# Patient Record
Sex: Female | Born: 1935
Health system: Southern US, Community
[De-identification: ages and names within clinical notes are randomized; demographics above are authoritative.]

## PROBLEM LIST (undated history)

## (undated) DIAGNOSIS — M722 Plantar fascial fibromatosis: Secondary | ICD-10-CM

## (undated) DIAGNOSIS — I739 Peripheral vascular disease, unspecified: Secondary | ICD-10-CM

## (undated) DIAGNOSIS — I10 Essential (primary) hypertension: Secondary | ICD-10-CM

## (undated) DIAGNOSIS — E119 Type 2 diabetes mellitus without complications: Secondary | ICD-10-CM

## (undated) HISTORY — PX: JOINT REPLACEMENT: SHX530

## (undated) HISTORY — PX: ECTOPIC PREGNANCY SURGERY: SHX613

## (undated) HISTORY — DX: Peripheral vascular disease, unspecified: I73.9

## (undated) HISTORY — DX: Plantar fascial fibromatosis: M72.2

## (undated) HISTORY — PX: ABDOMINAL HYSTERECTOMY: SHX81

## (undated) HISTORY — DX: Type 2 diabetes mellitus without complications: E11.9

---

## 2006-12-12 ENCOUNTER — Emergency Department (HOSPITAL_COMMUNITY): Admission: EM | Admit: 2006-12-12 | Discharge: 2006-12-12 | Payer: Self-pay | Admitting: Emergency Medicine

## 2011-03-31 ENCOUNTER — Emergency Department (HOSPITAL_COMMUNITY): Payer: Medicare Other

## 2011-03-31 ENCOUNTER — Emergency Department (HOSPITAL_COMMUNITY)
Admission: EM | Admit: 2011-03-31 | Discharge: 2011-03-31 | Disposition: A | Discharge status: Home / Self Care | Payer: Medicare Other | Attending: Emergency Medicine | Admitting: Emergency Medicine

## 2011-03-31 DIAGNOSIS — M546 Pain in thoracic spine: Secondary | ICD-10-CM | POA: Insufficient documentation

## 2011-03-31 DIAGNOSIS — I10 Essential (primary) hypertension: Secondary | ICD-10-CM | POA: Insufficient documentation

## 2011-03-31 DIAGNOSIS — R059 Cough, unspecified: Secondary | ICD-10-CM | POA: Insufficient documentation

## 2011-03-31 DIAGNOSIS — J3489 Other specified disorders of nose and nasal sinuses: Secondary | ICD-10-CM | POA: Insufficient documentation

## 2011-03-31 DIAGNOSIS — Z79899 Other long term (current) drug therapy: Secondary | ICD-10-CM | POA: Insufficient documentation

## 2011-03-31 DIAGNOSIS — E7800 Pure hypercholesterolemia, unspecified: Secondary | ICD-10-CM | POA: Insufficient documentation

## 2011-03-31 DIAGNOSIS — R6889 Other general symptoms and signs: Secondary | ICD-10-CM | POA: Insufficient documentation

## 2011-03-31 DIAGNOSIS — Z7982 Long term (current) use of aspirin: Secondary | ICD-10-CM | POA: Insufficient documentation

## 2011-03-31 DIAGNOSIS — R05 Cough: Secondary | ICD-10-CM | POA: Insufficient documentation

## 2011-03-31 DIAGNOSIS — J309 Allergic rhinitis, unspecified: Secondary | ICD-10-CM | POA: Insufficient documentation

## 2011-03-31 DIAGNOSIS — E119 Type 2 diabetes mellitus without complications: Secondary | ICD-10-CM | POA: Insufficient documentation

## 2011-08-02 DIAGNOSIS — L84 Corns and callosities: Secondary | ICD-10-CM | POA: Diagnosis not present

## 2011-08-02 DIAGNOSIS — L608 Other nail disorders: Secondary | ICD-10-CM | POA: Diagnosis not present

## 2011-08-02 DIAGNOSIS — E1159 Type 2 diabetes mellitus with other circulatory complications: Secondary | ICD-10-CM | POA: Diagnosis not present

## 2011-08-02 DIAGNOSIS — I739 Peripheral vascular disease, unspecified: Secondary | ICD-10-CM | POA: Diagnosis not present

## 2011-08-25 DIAGNOSIS — E119 Type 2 diabetes mellitus without complications: Secondary | ICD-10-CM | POA: Diagnosis not present

## 2011-10-21 DIAGNOSIS — E1159 Type 2 diabetes mellitus with other circulatory complications: Secondary | ICD-10-CM | POA: Diagnosis not present

## 2011-10-21 DIAGNOSIS — I739 Peripheral vascular disease, unspecified: Secondary | ICD-10-CM | POA: Diagnosis not present

## 2011-10-21 DIAGNOSIS — L608 Other nail disorders: Secondary | ICD-10-CM | POA: Diagnosis not present

## 2011-10-21 DIAGNOSIS — L84 Corns and callosities: Secondary | ICD-10-CM | POA: Diagnosis not present

## 2011-11-10 DIAGNOSIS — E119 Type 2 diabetes mellitus without complications: Secondary | ICD-10-CM | POA: Diagnosis not present

## 2011-11-10 DIAGNOSIS — H4011X0 Primary open-angle glaucoma, stage unspecified: Secondary | ICD-10-CM | POA: Diagnosis not present

## 2011-11-10 DIAGNOSIS — H04129 Dry eye syndrome of unspecified lacrimal gland: Secondary | ICD-10-CM | POA: Diagnosis not present

## 2011-11-30 DIAGNOSIS — E7800 Pure hypercholesterolemia, unspecified: Secondary | ICD-10-CM | POA: Diagnosis not present

## 2011-11-30 DIAGNOSIS — I10 Essential (primary) hypertension: Secondary | ICD-10-CM | POA: Diagnosis not present

## 2011-12-08 DIAGNOSIS — M25569 Pain in unspecified knee: Secondary | ICD-10-CM | POA: Diagnosis not present

## 2011-12-20 DIAGNOSIS — I10 Essential (primary) hypertension: Secondary | ICD-10-CM | POA: Diagnosis not present

## 2011-12-20 DIAGNOSIS — E109 Type 1 diabetes mellitus without complications: Secondary | ICD-10-CM | POA: Diagnosis not present

## 2011-12-22 DIAGNOSIS — I10 Essential (primary) hypertension: Secondary | ICD-10-CM | POA: Diagnosis not present

## 2011-12-22 DIAGNOSIS — E1110 Type 2 diabetes mellitus with ketoacidosis without coma: Secondary | ICD-10-CM | POA: Diagnosis not present

## 2011-12-22 DIAGNOSIS — E7800 Pure hypercholesterolemia, unspecified: Secondary | ICD-10-CM | POA: Diagnosis not present

## 2012-01-13 DIAGNOSIS — L84 Corns and callosities: Secondary | ICD-10-CM | POA: Diagnosis not present

## 2012-01-13 DIAGNOSIS — L608 Other nail disorders: Secondary | ICD-10-CM | POA: Diagnosis not present

## 2012-01-13 DIAGNOSIS — E1159 Type 2 diabetes mellitus with other circulatory complications: Secondary | ICD-10-CM | POA: Diagnosis not present

## 2012-01-13 DIAGNOSIS — I739 Peripheral vascular disease, unspecified: Secondary | ICD-10-CM | POA: Diagnosis not present

## 2012-02-15 DIAGNOSIS — Z1231 Encounter for screening mammogram for malignant neoplasm of breast: Secondary | ICD-10-CM | POA: Diagnosis not present

## 2012-03-23 DIAGNOSIS — I739 Peripheral vascular disease, unspecified: Secondary | ICD-10-CM | POA: Diagnosis not present

## 2012-03-23 DIAGNOSIS — M2040 Other hammer toe(s) (acquired), unspecified foot: Secondary | ICD-10-CM | POA: Diagnosis not present

## 2012-03-23 DIAGNOSIS — E1159 Type 2 diabetes mellitus with other circulatory complications: Secondary | ICD-10-CM | POA: Diagnosis not present

## 2012-03-23 DIAGNOSIS — L608 Other nail disorders: Secondary | ICD-10-CM | POA: Diagnosis not present

## 2012-03-23 DIAGNOSIS — L84 Corns and callosities: Secondary | ICD-10-CM | POA: Diagnosis not present

## 2012-05-18 DIAGNOSIS — H4011X0 Primary open-angle glaucoma, stage unspecified: Secondary | ICD-10-CM | POA: Diagnosis not present

## 2012-05-18 DIAGNOSIS — H2510 Age-related nuclear cataract, unspecified eye: Secondary | ICD-10-CM | POA: Diagnosis not present

## 2012-05-18 DIAGNOSIS — H04129 Dry eye syndrome of unspecified lacrimal gland: Secondary | ICD-10-CM | POA: Diagnosis not present

## 2012-05-22 DIAGNOSIS — E119 Type 2 diabetes mellitus without complications: Secondary | ICD-10-CM | POA: Diagnosis not present

## 2012-05-22 DIAGNOSIS — E781 Pure hyperglyceridemia: Secondary | ICD-10-CM | POA: Diagnosis not present

## 2012-05-22 DIAGNOSIS — I10 Essential (primary) hypertension: Secondary | ICD-10-CM | POA: Diagnosis not present

## 2012-05-23 DIAGNOSIS — E119 Type 2 diabetes mellitus without complications: Secondary | ICD-10-CM | POA: Diagnosis not present

## 2012-05-23 DIAGNOSIS — E7800 Pure hypercholesterolemia, unspecified: Secondary | ICD-10-CM | POA: Diagnosis not present

## 2012-05-23 DIAGNOSIS — I10 Essential (primary) hypertension: Secondary | ICD-10-CM | POA: Diagnosis not present

## 2012-05-23 DIAGNOSIS — H409 Unspecified glaucoma: Secondary | ICD-10-CM | POA: Diagnosis not present

## 2012-06-12 DIAGNOSIS — Z23 Encounter for immunization: Secondary | ICD-10-CM | POA: Diagnosis not present

## 2012-06-29 DIAGNOSIS — L608 Other nail disorders: Secondary | ICD-10-CM | POA: Diagnosis not present

## 2012-06-29 DIAGNOSIS — I739 Peripheral vascular disease, unspecified: Secondary | ICD-10-CM | POA: Diagnosis not present

## 2012-06-29 DIAGNOSIS — E1159 Type 2 diabetes mellitus with other circulatory complications: Secondary | ICD-10-CM | POA: Diagnosis not present

## 2012-06-29 DIAGNOSIS — L84 Corns and callosities: Secondary | ICD-10-CM | POA: Diagnosis not present

## 2012-10-05 DIAGNOSIS — D2370 Other benign neoplasm of skin of unspecified lower limb, including hip: Secondary | ICD-10-CM | POA: Diagnosis not present

## 2012-10-05 DIAGNOSIS — I739 Peripheral vascular disease, unspecified: Secondary | ICD-10-CM | POA: Diagnosis not present

## 2012-10-05 DIAGNOSIS — E1159 Type 2 diabetes mellitus with other circulatory complications: Secondary | ICD-10-CM | POA: Diagnosis not present

## 2012-10-05 DIAGNOSIS — L608 Other nail disorders: Secondary | ICD-10-CM | POA: Diagnosis not present

## 2012-10-05 DIAGNOSIS — M2040 Other hammer toe(s) (acquired), unspecified foot: Secondary | ICD-10-CM | POA: Diagnosis not present

## 2012-10-11 ENCOUNTER — Other Ambulatory Visit: Payer: Self-pay

## 2012-10-11 ENCOUNTER — Encounter: Payer: Self-pay | Admitting: Vascular Surgery

## 2012-10-11 DIAGNOSIS — M79609 Pain in unspecified limb: Secondary | ICD-10-CM

## 2012-10-11 DIAGNOSIS — R0989 Other specified symptoms and signs involving the circulatory and respiratory systems: Secondary | ICD-10-CM

## 2012-10-19 DIAGNOSIS — E119 Type 2 diabetes mellitus without complications: Secondary | ICD-10-CM | POA: Diagnosis not present

## 2012-10-19 DIAGNOSIS — I10 Essential (primary) hypertension: Secondary | ICD-10-CM | POA: Diagnosis not present

## 2012-10-20 DIAGNOSIS — I10 Essential (primary) hypertension: Secondary | ICD-10-CM | POA: Diagnosis not present

## 2012-10-20 DIAGNOSIS — E7800 Pure hypercholesterolemia, unspecified: Secondary | ICD-10-CM | POA: Diagnosis not present

## 2012-10-20 DIAGNOSIS — E119 Type 2 diabetes mellitus without complications: Secondary | ICD-10-CM | POA: Diagnosis not present

## 2012-11-01 ENCOUNTER — Encounter: Payer: Self-pay | Admitting: Vascular Surgery

## 2012-11-02 ENCOUNTER — Encounter: Payer: Self-pay | Admitting: Vascular Surgery

## 2012-11-02 ENCOUNTER — Ambulatory Visit (INDEPENDENT_AMBULATORY_CARE_PROVIDER_SITE_OTHER): Payer: Medicare Other | Admitting: Vascular Surgery

## 2012-11-02 ENCOUNTER — Encounter (INDEPENDENT_AMBULATORY_CARE_PROVIDER_SITE_OTHER): Payer: Medicare Other | Admitting: *Deleted

## 2012-11-02 VITALS — BP 122/78 | HR 80 | Ht 62.0 in | Wt 220.3 lb

## 2012-11-02 DIAGNOSIS — I739 Peripheral vascular disease, unspecified: Secondary | ICD-10-CM | POA: Diagnosis not present

## 2012-11-02 DIAGNOSIS — R0989 Other specified symptoms and signs involving the circulatory and respiratory systems: Secondary | ICD-10-CM | POA: Diagnosis not present

## 2012-11-02 DIAGNOSIS — M79609 Pain in unspecified limb: Secondary | ICD-10-CM | POA: Diagnosis not present

## 2012-11-02 NOTE — Progress Notes (Signed)
[facility] [place] HISTORY AND PHYSICAL   History of Present Illness:  Patient is a 79 y.o. year old female who presents for evaluation of perfusion and healing potential for a pending hammertoe operation. She does not really complain of pain in his hammertoe deformities. She has not had any ulcerations of the feet due to to overlap. The patient denies history of claudication. She denies history of rest pain. She has bilateral hammertoes. She was recently seen and evaluated by Dr. Elvin [name] for this. She does have a history of diabetes. She does not have any history of nonhealing wounds. She does not smoke. She does have family history her mother of similar hammertoe deformities as well as problems with atherosclerosis of her lower extremities. She does have some occasional numbness and tingling in her toes. She has had bilateral knee replacements. Apparently the left side was infected and required a muscle flap from her right calf area to heal. This was several years ago in [place]. Other medical problems include diabetes and peripheral arterial disease as mentioned above. These are both currently stable. She is on a statin and aspirin.  Past Medical History  Diagnosis Date  . Peripheral vascular disease   . Diabetes mellitus without complication   . Plantar fascial fibromatosis     Past Surgical History  Procedure Laterality Date  . Abdominal hysterectomy      partial  . Ectopic pregnancy surgery    . Joint replacement Bilateral     TKR     Social History History  Substance Use Topics  . Smoking status: Never Smoker   . Smokeless tobacco: Never Used  . Alcohol Use: No    Family History Family History  Problem Relation Age of Onset  . Hypertension Mother   . Hyperlipidemia Mother     before age 63  . Heart disease Father   . Hypertension Father   . Hyperlipidemia Father     before age 60  . Diabetes Sister   . Other Sister     amputation     Allergies  No Known Allergies   Current Outpatient Prescriptions  Medication Sig Dispense Refill  . aspirin 81 MG tablet Take 81 mg by mouth daily.      Marland Kitchen glipiZIDE (GLUCOTROL) 5 MG tablet Take 5 mg by mouth 2 (two) times daily before a meal.      . lisinopril-hydrochlorothiazide (PRINZIDE,ZESTORETIC) 20-12.5 MG per tablet Take 1 tablet by mouth 2 (two) times daily.      . Multiple Vitamins-Minerals (CENTRUM SILVER ADULT 50+ PO) Take 1 tablet by mouth daily.      Bertram Gala Glycol-Propyl Glycol (SYSTANE ULTRA OP) Apply to eye.      . simvastatin (ZOCOR) 40 MG tablet Take 40 mg by mouth daily.      Marland Kitchen VERAPAMIL HCL ER PO Take 300 mg by mouth daily.      . vitamin C (ASCORBIC ACID) 500 MG tablet Take 500 mg by mouth daily.       No current facility-administered medications for this visit.    ROS:   General:  No weight loss, Fever, chills  HEENT: No recent headaches, no nasal bleeding, no visual changes, no sore throat  Neurologic: No dizziness, blackouts, seizures. No recent symptoms of stroke or mini- stroke. No recent episodes of slurred speech, or temporary blindness.  Cardiac: No recent episodes of chest pain/pressure, no shortness of breath at rest.  No shortness of breath with exertion.  Denies history of atrial fibrillation or irregular heartbeat  Vascular: No history of rest pain in feet.  No history of claudication.  No history of non-healing ulcer, No history of DVT   Pulmonary: No home oxygen, no productive cough, no hemoptysis,  No asthma or wheezing  Musculoskeletal:  [ ]  Arthritis, [ ]  Low back pain,  [ x ] Joint pain  Hematologic:No history of hypercoagulable state.  No history of easy bleeding.  No history of anemia  Gastrointestinal: No hematochezia or melena,  No gastroesophageal reflux, no trouble swallowing  Urinary: [ ]  chronic Kidney disease, [ ]  on HD - [ ]  MWF or [ ]  TTHS, [ ]  Burning with urination, [ ]  Frequent urination, [ ]  Difficulty urinating;    Skin: No rashes  Psychological: No history of anxiety,  No history of depression   Physical Examination  Filed Vitals:   11/02/12 1006  BP: 122/78  Pulse: 80  Height: 5\' 2"  (1.575 m)  Weight: 220 lb 4.8 oz (99.927 kg)  SpO2: 100%    Body mass index is 40.28 kg/(m^2).  General:  Alert and oriented, no acute distress HEENT: Normal Neck: No bruit or JVD Pulmonary: Clear to auscultation bilaterally Cardiac: Regular Rate and Rhythm without murmur Abdomen: Soft, non-tender, non-distended, no mass, obese Skin: No rash, no ulcer Extremity Pulses:  2+ radial, brachial, femoral, absent popliteal dorsalis pedis, posterior tibial pulses bilaterally Musculoskeletal: No edema, hammertoe deformity left third toe right second toe Neurologic: Upper and lower extremity motor 5/5 and symmetric  DATA: Patient had bilateral ABIs today which were 1.5 on the right 1.6 on the left. These were artificially elevated suggesting vessel calcification. Waveforms were biphasic to triphasic which is normal. Her toe pressure on the left side was 107 which should have good wound healing potential. The toe pressure on the right was 60 which has marginal wound healing potential.   ASSESSMENT: 79 year old female with diabetes and objective evidence of most likely small vessel disease right foot greater than left. She does have normal arterial waveforms in the feet. I believe she would have good wound healing potential in the left foot. The right foot may be marginal. However, have encouraged her to avoid any operations and her feet unless she has severe symptoms. She will followup with [place] on as-needed basis.   PLAN:  See above  Fabienne Bruns, MD [facility] Office: [phone] Pager: [phone]

## 2012-11-16 DIAGNOSIS — H4011X0 Primary open-angle glaucoma, stage unspecified: Secondary | ICD-10-CM | POA: Diagnosis not present

## 2012-11-16 DIAGNOSIS — H2510 Age-related nuclear cataract, unspecified eye: Secondary | ICD-10-CM | POA: Diagnosis not present

## 2012-11-16 DIAGNOSIS — E119 Type 2 diabetes mellitus without complications: Secondary | ICD-10-CM | POA: Diagnosis not present

## 2013-01-01 DIAGNOSIS — L84 Corns and callosities: Secondary | ICD-10-CM | POA: Diagnosis not present

## 2013-01-01 DIAGNOSIS — I739 Peripheral vascular disease, unspecified: Secondary | ICD-10-CM | POA: Diagnosis not present

## 2013-01-01 DIAGNOSIS — M7150 Other bursitis, not elsewhere classified, unspecified site: Secondary | ICD-10-CM | POA: Diagnosis not present

## 2013-01-01 DIAGNOSIS — E1159 Type 2 diabetes mellitus with other circulatory complications: Secondary | ICD-10-CM | POA: Diagnosis not present

## 2013-01-01 DIAGNOSIS — M2010 Hallux valgus (acquired), unspecified foot: Secondary | ICD-10-CM | POA: Diagnosis not present

## 2013-01-01 DIAGNOSIS — L608 Other nail disorders: Secondary | ICD-10-CM | POA: Diagnosis not present

## 2013-01-01 DIAGNOSIS — D2370 Other benign neoplasm of skin of unspecified lower limb, including hip: Secondary | ICD-10-CM | POA: Diagnosis not present

## 2013-02-19 DIAGNOSIS — Z1231 Encounter for screening mammogram for malignant neoplasm of breast: Secondary | ICD-10-CM | POA: Diagnosis not present

## 2013-02-20 DIAGNOSIS — I10 Essential (primary) hypertension: Secondary | ICD-10-CM | POA: Diagnosis not present

## 2013-02-20 DIAGNOSIS — E119 Type 2 diabetes mellitus without complications: Secondary | ICD-10-CM | POA: Diagnosis not present

## 2013-02-20 DIAGNOSIS — E7800 Pure hypercholesterolemia, unspecified: Secondary | ICD-10-CM | POA: Diagnosis not present

## 2013-03-26 DIAGNOSIS — I739 Peripheral vascular disease, unspecified: Secondary | ICD-10-CM | POA: Diagnosis not present

## 2013-03-26 DIAGNOSIS — L608 Other nail disorders: Secondary | ICD-10-CM | POA: Diagnosis not present

## 2013-03-26 DIAGNOSIS — E1159 Type 2 diabetes mellitus with other circulatory complications: Secondary | ICD-10-CM | POA: Diagnosis not present

## 2013-03-26 DIAGNOSIS — L84 Corns and callosities: Secondary | ICD-10-CM | POA: Diagnosis not present

## 2013-04-26 DIAGNOSIS — Z23 Encounter for immunization: Secondary | ICD-10-CM | POA: Diagnosis not present

## 2013-05-31 DIAGNOSIS — H2510 Age-related nuclear cataract, unspecified eye: Secondary | ICD-10-CM | POA: Diagnosis not present

## 2013-05-31 DIAGNOSIS — H4011X0 Primary open-angle glaucoma, stage unspecified: Secondary | ICD-10-CM | POA: Diagnosis not present

## 2013-05-31 DIAGNOSIS — H04129 Dry eye syndrome of unspecified lacrimal gland: Secondary | ICD-10-CM | POA: Diagnosis not present

## 2013-09-03 DIAGNOSIS — M25569 Pain in unspecified knee: Secondary | ICD-10-CM | POA: Diagnosis not present

## 2013-09-27 DIAGNOSIS — L84 Corns and callosities: Secondary | ICD-10-CM | POA: Diagnosis not present

## 2013-09-27 DIAGNOSIS — E1159 Type 2 diabetes mellitus with other circulatory complications: Secondary | ICD-10-CM | POA: Diagnosis not present

## 2013-09-27 DIAGNOSIS — L608 Other nail disorders: Secondary | ICD-10-CM | POA: Diagnosis not present

## 2013-09-27 DIAGNOSIS — I739 Peripheral vascular disease, unspecified: Secondary | ICD-10-CM | POA: Diagnosis not present

## 2013-10-23 DIAGNOSIS — I10 Essential (primary) hypertension: Secondary | ICD-10-CM | POA: Diagnosis not present

## 2013-10-23 DIAGNOSIS — M1250 Traumatic arthropathy, unspecified site: Secondary | ICD-10-CM | POA: Diagnosis not present

## 2013-10-23 DIAGNOSIS — E119 Type 2 diabetes mellitus without complications: Secondary | ICD-10-CM | POA: Diagnosis not present

## 2013-12-05 DIAGNOSIS — E119 Type 2 diabetes mellitus without complications: Secondary | ICD-10-CM | POA: Diagnosis not present

## 2013-12-05 DIAGNOSIS — H2510 Age-related nuclear cataract, unspecified eye: Secondary | ICD-10-CM | POA: Diagnosis not present

## 2013-12-05 DIAGNOSIS — H40019 Open angle with borderline findings, low risk, unspecified eye: Secondary | ICD-10-CM | POA: Diagnosis not present

## 2013-12-20 DIAGNOSIS — I739 Peripheral vascular disease, unspecified: Secondary | ICD-10-CM | POA: Diagnosis not present

## 2013-12-20 DIAGNOSIS — L608 Other nail disorders: Secondary | ICD-10-CM | POA: Diagnosis not present

## 2013-12-20 DIAGNOSIS — L84 Corns and callosities: Secondary | ICD-10-CM | POA: Diagnosis not present

## 2013-12-20 DIAGNOSIS — E1159 Type 2 diabetes mellitus with other circulatory complications: Secondary | ICD-10-CM | POA: Diagnosis not present

## 2014-02-20 DIAGNOSIS — I10 Essential (primary) hypertension: Secondary | ICD-10-CM | POA: Diagnosis not present

## 2014-02-20 DIAGNOSIS — E119 Type 2 diabetes mellitus without complications: Secondary | ICD-10-CM | POA: Diagnosis not present

## 2014-03-01 DIAGNOSIS — I739 Peripheral vascular disease, unspecified: Secondary | ICD-10-CM | POA: Diagnosis not present

## 2014-03-01 DIAGNOSIS — L608 Other nail disorders: Secondary | ICD-10-CM | POA: Diagnosis not present

## 2014-03-01 DIAGNOSIS — L84 Corns and callosities: Secondary | ICD-10-CM | POA: Diagnosis not present

## 2014-03-01 DIAGNOSIS — E1159 Type 2 diabetes mellitus with other circulatory complications: Secondary | ICD-10-CM | POA: Diagnosis not present

## 2014-03-05 DIAGNOSIS — Z1231 Encounter for screening mammogram for malignant neoplasm of breast: Secondary | ICD-10-CM | POA: Diagnosis not present

## 2014-05-23 DIAGNOSIS — L84 Corns and callosities: Secondary | ICD-10-CM | POA: Diagnosis not present

## 2014-05-23 DIAGNOSIS — I739 Peripheral vascular disease, unspecified: Secondary | ICD-10-CM | POA: Diagnosis not present

## 2014-05-23 DIAGNOSIS — L603 Nail dystrophy: Secondary | ICD-10-CM | POA: Diagnosis not present

## 2014-05-23 DIAGNOSIS — E1151 Type 2 diabetes mellitus with diabetic peripheral angiopathy without gangrene: Secondary | ICD-10-CM | POA: Diagnosis not present

## 2014-06-21 DIAGNOSIS — E0800 Diabetes mellitus due to underlying condition with hyperosmolarity without nonketotic hyperglycemic-hyperosmolar coma (NKHHC): Secondary | ICD-10-CM | POA: Diagnosis not present

## 2014-06-21 DIAGNOSIS — M1250 Traumatic arthropathy, unspecified site: Secondary | ICD-10-CM | POA: Diagnosis not present

## 2014-06-21 DIAGNOSIS — Z23 Encounter for immunization: Secondary | ICD-10-CM | POA: Diagnosis not present

## 2014-06-21 DIAGNOSIS — E780 Pure hypercholesterolemia: Secondary | ICD-10-CM | POA: Diagnosis not present

## 2014-06-21 DIAGNOSIS — I10 Essential (primary) hypertension: Secondary | ICD-10-CM | POA: Diagnosis not present

## 2014-06-23 DIAGNOSIS — Z23 Encounter for immunization: Secondary | ICD-10-CM | POA: Diagnosis not present

## 2014-09-12 DIAGNOSIS — H4011X1 Primary open-angle glaucoma, mild stage: Secondary | ICD-10-CM | POA: Diagnosis not present

## 2014-09-12 DIAGNOSIS — E119 Type 2 diabetes mellitus without complications: Secondary | ICD-10-CM | POA: Diagnosis not present

## 2014-09-12 DIAGNOSIS — H25813 Combined forms of age-related cataract, bilateral: Secondary | ICD-10-CM | POA: Diagnosis not present

## 2014-10-22 DIAGNOSIS — F4329 Adjustment disorder with other symptoms: Secondary | ICD-10-CM | POA: Diagnosis not present

## 2014-10-22 DIAGNOSIS — E782 Mixed hyperlipidemia: Secondary | ICD-10-CM | POA: Diagnosis not present

## 2014-10-22 DIAGNOSIS — E0800 Diabetes mellitus due to underlying condition with hyperosmolarity without nonketotic hyperglycemic-hyperosmolar coma (NKHHC): Secondary | ICD-10-CM | POA: Diagnosis not present

## 2014-10-22 DIAGNOSIS — I10 Essential (primary) hypertension: Secondary | ICD-10-CM | POA: Diagnosis not present

## 2014-10-22 DIAGNOSIS — F4324 Adjustment disorder with disturbance of conduct: Secondary | ICD-10-CM | POA: Diagnosis not present

## 2014-10-22 DIAGNOSIS — H409 Unspecified glaucoma: Secondary | ICD-10-CM | POA: Diagnosis not present

## 2014-10-28 DIAGNOSIS — E1151 Type 2 diabetes mellitus with diabetic peripheral angiopathy without gangrene: Secondary | ICD-10-CM | POA: Diagnosis not present

## 2014-10-28 DIAGNOSIS — L84 Corns and callosities: Secondary | ICD-10-CM | POA: Diagnosis not present

## 2014-10-28 DIAGNOSIS — L603 Nail dystrophy: Secondary | ICD-10-CM | POA: Diagnosis not present

## 2014-10-28 DIAGNOSIS — I739 Peripheral vascular disease, unspecified: Secondary | ICD-10-CM | POA: Diagnosis not present

## 2015-01-06 DIAGNOSIS — L84 Corns and callosities: Secondary | ICD-10-CM | POA: Diagnosis not present

## 2015-01-06 DIAGNOSIS — E1151 Type 2 diabetes mellitus with diabetic peripheral angiopathy without gangrene: Secondary | ICD-10-CM | POA: Diagnosis not present

## 2015-01-06 DIAGNOSIS — I739 Peripheral vascular disease, unspecified: Secondary | ICD-10-CM | POA: Diagnosis not present

## 2015-01-06 DIAGNOSIS — L603 Nail dystrophy: Secondary | ICD-10-CM | POA: Diagnosis not present

## 2015-01-06 DIAGNOSIS — M79609 Pain in unspecified limb: Secondary | ICD-10-CM | POA: Diagnosis not present

## 2015-02-19 DIAGNOSIS — M1250 Traumatic arthropathy, unspecified site: Secondary | ICD-10-CM | POA: Diagnosis not present

## 2015-02-19 DIAGNOSIS — E782 Mixed hyperlipidemia: Secondary | ICD-10-CM | POA: Diagnosis not present

## 2015-02-19 DIAGNOSIS — I952 Hypotension due to drugs: Secondary | ICD-10-CM | POA: Diagnosis not present

## 2015-02-19 DIAGNOSIS — F4324 Adjustment disorder with disturbance of conduct: Secondary | ICD-10-CM | POA: Diagnosis not present

## 2015-03-13 DIAGNOSIS — H4011X1 Primary open-angle glaucoma, mild stage: Secondary | ICD-10-CM | POA: Diagnosis not present

## 2015-03-13 DIAGNOSIS — H04123 Dry eye syndrome of bilateral lacrimal glands: Secondary | ICD-10-CM | POA: Diagnosis not present

## 2015-03-21 DIAGNOSIS — I952 Hypotension due to drugs: Secondary | ICD-10-CM | POA: Diagnosis not present

## 2015-03-31 DIAGNOSIS — L603 Nail dystrophy: Secondary | ICD-10-CM | POA: Diagnosis not present

## 2015-03-31 DIAGNOSIS — L84 Corns and callosities: Secondary | ICD-10-CM | POA: Diagnosis not present

## 2015-03-31 DIAGNOSIS — I739 Peripheral vascular disease, unspecified: Secondary | ICD-10-CM | POA: Diagnosis not present

## 2015-03-31 DIAGNOSIS — E1151 Type 2 diabetes mellitus with diabetic peripheral angiopathy without gangrene: Secondary | ICD-10-CM | POA: Diagnosis not present

## 2015-04-23 DIAGNOSIS — I10 Essential (primary) hypertension: Secondary | ICD-10-CM | POA: Diagnosis not present

## 2015-04-23 DIAGNOSIS — Z6836 Body mass index (BMI) 36.0-36.9, adult: Secondary | ICD-10-CM | POA: Diagnosis not present

## 2015-05-15 DIAGNOSIS — Z1231 Encounter for screening mammogram for malignant neoplasm of breast: Secondary | ICD-10-CM | POA: Diagnosis not present

## 2015-06-19 DIAGNOSIS — I739 Peripheral vascular disease, unspecified: Secondary | ICD-10-CM | POA: Diagnosis not present

## 2015-06-19 DIAGNOSIS — E1151 Type 2 diabetes mellitus with diabetic peripheral angiopathy without gangrene: Secondary | ICD-10-CM | POA: Diagnosis not present

## 2015-06-19 DIAGNOSIS — L603 Nail dystrophy: Secondary | ICD-10-CM | POA: Diagnosis not present

## 2015-06-19 DIAGNOSIS — L84 Corns and callosities: Secondary | ICD-10-CM | POA: Diagnosis not present

## 2015-06-24 DIAGNOSIS — I10 Essential (primary) hypertension: Secondary | ICD-10-CM | POA: Diagnosis not present

## 2015-06-24 DIAGNOSIS — Z6837 Body mass index (BMI) 37.0-37.9, adult: Secondary | ICD-10-CM | POA: Diagnosis not present

## 2015-06-24 DIAGNOSIS — M1250 Traumatic arthropathy, unspecified site: Secondary | ICD-10-CM | POA: Diagnosis not present

## 2015-06-24 DIAGNOSIS — E089 Diabetes mellitus due to underlying condition without complications: Secondary | ICD-10-CM | POA: Diagnosis not present

## 2015-07-11 DIAGNOSIS — Z96653 Presence of artificial knee joint, bilateral: Secondary | ICD-10-CM | POA: Diagnosis not present

## 2015-09-01 DIAGNOSIS — I739 Peripheral vascular disease, unspecified: Secondary | ICD-10-CM | POA: Diagnosis not present

## 2015-09-01 DIAGNOSIS — E1151 Type 2 diabetes mellitus with diabetic peripheral angiopathy without gangrene: Secondary | ICD-10-CM | POA: Diagnosis not present

## 2015-09-01 DIAGNOSIS — L603 Nail dystrophy: Secondary | ICD-10-CM | POA: Diagnosis not present

## 2015-09-01 DIAGNOSIS — L84 Corns and callosities: Secondary | ICD-10-CM | POA: Diagnosis not present

## 2015-09-24 DIAGNOSIS — H401131 Primary open-angle glaucoma, bilateral, mild stage: Secondary | ICD-10-CM | POA: Diagnosis not present

## 2015-09-24 DIAGNOSIS — H2511 Age-related nuclear cataract, right eye: Secondary | ICD-10-CM | POA: Diagnosis not present

## 2015-09-24 DIAGNOSIS — E119 Type 2 diabetes mellitus without complications: Secondary | ICD-10-CM | POA: Diagnosis not present

## 2015-09-24 DIAGNOSIS — H25812 Combined forms of age-related cataract, left eye: Secondary | ICD-10-CM | POA: Diagnosis not present

## 2015-10-22 DIAGNOSIS — M1250 Traumatic arthropathy, unspecified site: Secondary | ICD-10-CM | POA: Diagnosis not present

## 2015-10-22 DIAGNOSIS — E7800 Pure hypercholesterolemia, unspecified: Secondary | ICD-10-CM | POA: Diagnosis not present

## 2015-10-22 DIAGNOSIS — E785 Hyperlipidemia, unspecified: Secondary | ICD-10-CM | POA: Diagnosis not present

## 2015-10-22 DIAGNOSIS — E089 Diabetes mellitus due to underlying condition without complications: Secondary | ICD-10-CM | POA: Diagnosis not present

## 2015-10-22 DIAGNOSIS — I10 Essential (primary) hypertension: Secondary | ICD-10-CM | POA: Diagnosis not present

## 2015-10-22 DIAGNOSIS — M1200 Chronic postrheumatic arthropathy [Jaccoud], unspecified site: Secondary | ICD-10-CM | POA: Diagnosis not present

## 2015-11-03 DIAGNOSIS — M533 Sacrococcygeal disorders, not elsewhere classified: Secondary | ICD-10-CM | POA: Diagnosis not present

## 2015-11-03 DIAGNOSIS — M62838 Other muscle spasm: Secondary | ICD-10-CM | POA: Diagnosis not present

## 2015-11-05 DIAGNOSIS — M533 Sacrococcygeal disorders, not elsewhere classified: Secondary | ICD-10-CM | POA: Diagnosis not present

## 2015-11-05 DIAGNOSIS — M62838 Other muscle spasm: Secondary | ICD-10-CM | POA: Diagnosis not present

## 2015-11-10 DIAGNOSIS — M62838 Other muscle spasm: Secondary | ICD-10-CM | POA: Diagnosis not present

## 2015-11-10 DIAGNOSIS — M533 Sacrococcygeal disorders, not elsewhere classified: Secondary | ICD-10-CM | POA: Diagnosis not present

## 2015-11-12 DIAGNOSIS — M533 Sacrococcygeal disorders, not elsewhere classified: Secondary | ICD-10-CM | POA: Diagnosis not present

## 2015-11-12 DIAGNOSIS — M62838 Other muscle spasm: Secondary | ICD-10-CM | POA: Diagnosis not present

## 2015-11-17 DIAGNOSIS — I739 Peripheral vascular disease, unspecified: Secondary | ICD-10-CM | POA: Diagnosis not present

## 2015-11-17 DIAGNOSIS — L603 Nail dystrophy: Secondary | ICD-10-CM | POA: Diagnosis not present

## 2015-11-17 DIAGNOSIS — E1151 Type 2 diabetes mellitus with diabetic peripheral angiopathy without gangrene: Secondary | ICD-10-CM | POA: Diagnosis not present

## 2015-11-17 DIAGNOSIS — L84 Corns and callosities: Secondary | ICD-10-CM | POA: Diagnosis not present

## 2015-11-20 DIAGNOSIS — M533 Sacrococcygeal disorders, not elsewhere classified: Secondary | ICD-10-CM | POA: Diagnosis not present

## 2015-11-20 DIAGNOSIS — M62838 Other muscle spasm: Secondary | ICD-10-CM | POA: Diagnosis not present

## 2015-11-25 DIAGNOSIS — M533 Sacrococcygeal disorders, not elsewhere classified: Secondary | ICD-10-CM | POA: Diagnosis not present

## 2015-11-25 DIAGNOSIS — M62838 Other muscle spasm: Secondary | ICD-10-CM | POA: Diagnosis not present

## 2015-11-27 DIAGNOSIS — M533 Sacrococcygeal disorders, not elsewhere classified: Secondary | ICD-10-CM | POA: Diagnosis not present

## 2015-11-27 DIAGNOSIS — M62838 Other muscle spasm: Secondary | ICD-10-CM | POA: Diagnosis not present

## 2015-12-02 DIAGNOSIS — M533 Sacrococcygeal disorders, not elsewhere classified: Secondary | ICD-10-CM | POA: Diagnosis not present

## 2015-12-02 DIAGNOSIS — M62838 Other muscle spasm: Secondary | ICD-10-CM | POA: Diagnosis not present

## 2016-01-30 DIAGNOSIS — E1151 Type 2 diabetes mellitus with diabetic peripheral angiopathy without gangrene: Secondary | ICD-10-CM | POA: Diagnosis not present

## 2016-01-30 DIAGNOSIS — L84 Corns and callosities: Secondary | ICD-10-CM | POA: Diagnosis not present

## 2016-01-30 DIAGNOSIS — I739 Peripheral vascular disease, unspecified: Secondary | ICD-10-CM | POA: Diagnosis not present

## 2016-01-30 DIAGNOSIS — L603 Nail dystrophy: Secondary | ICD-10-CM | POA: Diagnosis not present

## 2016-03-15 ENCOUNTER — Other Ambulatory Visit: Payer: Self-pay

## 2016-03-26 DIAGNOSIS — H25813 Combined forms of age-related cataract, bilateral: Secondary | ICD-10-CM | POA: Diagnosis not present

## 2016-03-26 DIAGNOSIS — H401131 Primary open-angle glaucoma, bilateral, mild stage: Secondary | ICD-10-CM | POA: Diagnosis not present

## 2016-03-26 DIAGNOSIS — E119 Type 2 diabetes mellitus without complications: Secondary | ICD-10-CM | POA: Diagnosis not present

## 2016-03-26 DIAGNOSIS — H04123 Dry eye syndrome of bilateral lacrimal glands: Secondary | ICD-10-CM | POA: Diagnosis not present

## 2016-04-16 DIAGNOSIS — L84 Corns and callosities: Secondary | ICD-10-CM | POA: Diagnosis not present

## 2016-04-16 DIAGNOSIS — I739 Peripheral vascular disease, unspecified: Secondary | ICD-10-CM | POA: Diagnosis not present

## 2016-04-16 DIAGNOSIS — L603 Nail dystrophy: Secondary | ICD-10-CM | POA: Diagnosis not present

## 2016-04-16 DIAGNOSIS — E1151 Type 2 diabetes mellitus with diabetic peripheral angiopathy without gangrene: Secondary | ICD-10-CM | POA: Diagnosis not present

## 2016-04-27 DIAGNOSIS — F4324 Adjustment disorder with disturbance of conduct: Secondary | ICD-10-CM | POA: Diagnosis not present

## 2016-04-27 DIAGNOSIS — M1250 Traumatic arthropathy, unspecified site: Secondary | ICD-10-CM | POA: Diagnosis not present

## 2016-04-27 DIAGNOSIS — E785 Hyperlipidemia, unspecified: Secondary | ICD-10-CM | POA: Diagnosis not present

## 2016-04-27 DIAGNOSIS — Z6835 Body mass index (BMI) 35.0-35.9, adult: Secondary | ICD-10-CM | POA: Diagnosis not present

## 2016-04-28 DIAGNOSIS — M5442 Lumbago with sciatica, left side: Secondary | ICD-10-CM | POA: Diagnosis not present

## 2016-05-18 DIAGNOSIS — M5442 Lumbago with sciatica, left side: Secondary | ICD-10-CM | POA: Diagnosis not present

## 2016-07-05 DIAGNOSIS — E1151 Type 2 diabetes mellitus with diabetic peripheral angiopathy without gangrene: Secondary | ICD-10-CM | POA: Diagnosis not present

## 2016-07-05 DIAGNOSIS — L603 Nail dystrophy: Secondary | ICD-10-CM | POA: Diagnosis not present

## 2016-07-05 DIAGNOSIS — I739 Peripheral vascular disease, unspecified: Secondary | ICD-10-CM | POA: Diagnosis not present

## 2016-07-05 DIAGNOSIS — L84 Corns and callosities: Secondary | ICD-10-CM | POA: Diagnosis not present

## 2016-08-25 DIAGNOSIS — M1250 Traumatic arthropathy, unspecified site: Secondary | ICD-10-CM | POA: Diagnosis not present

## 2016-08-25 DIAGNOSIS — I10 Essential (primary) hypertension: Secondary | ICD-10-CM | POA: Diagnosis not present

## 2016-08-25 DIAGNOSIS — E089 Diabetes mellitus due to underlying condition without complications: Secondary | ICD-10-CM | POA: Diagnosis not present

## 2016-08-25 DIAGNOSIS — R635 Abnormal weight gain: Secondary | ICD-10-CM | POA: Diagnosis not present

## 2016-08-25 DIAGNOSIS — E782 Mixed hyperlipidemia: Secondary | ICD-10-CM | POA: Diagnosis not present

## 2016-08-25 DIAGNOSIS — Z23 Encounter for immunization: Secondary | ICD-10-CM | POA: Diagnosis not present

## 2016-08-25 DIAGNOSIS — R634 Abnormal weight loss: Secondary | ICD-10-CM | POA: Diagnosis not present

## 2016-08-31 ENCOUNTER — Emergency Department (HOSPITAL_COMMUNITY): Payer: Medicare Other

## 2016-08-31 ENCOUNTER — Encounter (HOSPITAL_COMMUNITY): Payer: Self-pay

## 2016-08-31 ENCOUNTER — Inpatient Hospital Stay (HOSPITAL_COMMUNITY)
Admission: EM | Admit: 2016-08-31 | Discharge: 2016-09-09 | DRG: 871 | Disposition: A | Discharge status: Skilled Nursing Facility | Payer: Medicare Other | Attending: Internal Medicine | Admitting: Internal Medicine

## 2016-08-31 DIAGNOSIS — R0602 Shortness of breath: Secondary | ICD-10-CM

## 2016-08-31 DIAGNOSIS — Z833 Family history of diabetes mellitus: Secondary | ICD-10-CM

## 2016-08-31 DIAGNOSIS — G9341 Metabolic encephalopathy: Secondary | ICD-10-CM | POA: Diagnosis not present

## 2016-08-31 DIAGNOSIS — B9620 Unspecified Escherichia coli [E. coli] as the cause of diseases classified elsewhere: Secondary | ICD-10-CM | POA: Diagnosis not present

## 2016-08-31 DIAGNOSIS — E1151 Type 2 diabetes mellitus with diabetic peripheral angiopathy without gangrene: Secondary | ICD-10-CM | POA: Diagnosis present

## 2016-08-31 DIAGNOSIS — M722 Plantar fascial fibromatosis: Secondary | ICD-10-CM | POA: Diagnosis present

## 2016-08-31 DIAGNOSIS — A409 Streptococcal sepsis, unspecified: Secondary | ICD-10-CM | POA: Diagnosis not present

## 2016-08-31 DIAGNOSIS — Z90710 Acquired absence of both cervix and uterus: Secondary | ICD-10-CM | POA: Diagnosis not present

## 2016-08-31 DIAGNOSIS — B955 Unspecified streptococcus as the cause of diseases classified elsewhere: Secondary | ICD-10-CM | POA: Diagnosis present

## 2016-08-31 DIAGNOSIS — R197 Diarrhea, unspecified: Secondary | ICD-10-CM | POA: Diagnosis not present

## 2016-08-31 DIAGNOSIS — E875 Hyperkalemia: Secondary | ICD-10-CM | POA: Diagnosis not present

## 2016-08-31 DIAGNOSIS — D649 Anemia, unspecified: Secondary | ICD-10-CM | POA: Diagnosis present

## 2016-08-31 DIAGNOSIS — E876 Hypokalemia: Secondary | ICD-10-CM | POA: Diagnosis not present

## 2016-08-31 DIAGNOSIS — R739 Hyperglycemia, unspecified: Secondary | ICD-10-CM | POA: Diagnosis not present

## 2016-08-31 DIAGNOSIS — Z8249 Family history of ischemic heart disease and other diseases of the circulatory system: Secondary | ICD-10-CM

## 2016-08-31 DIAGNOSIS — R6520 Severe sepsis without septic shock: Secondary | ICD-10-CM | POA: Diagnosis not present

## 2016-08-31 DIAGNOSIS — R740 Nonspecific elevation of levels of transaminase and lactic acid dehydrogenase [LDH]: Secondary | ICD-10-CM | POA: Diagnosis present

## 2016-08-31 DIAGNOSIS — Z515 Encounter for palliative care: Secondary | ICD-10-CM

## 2016-08-31 DIAGNOSIS — Z7982 Long term (current) use of aspirin: Secondary | ICD-10-CM

## 2016-08-31 DIAGNOSIS — E1165 Type 2 diabetes mellitus with hyperglycemia: Secondary | ICD-10-CM | POA: Diagnosis present

## 2016-08-31 DIAGNOSIS — R4182 Altered mental status, unspecified: Secondary | ICD-10-CM | POA: Diagnosis not present

## 2016-08-31 DIAGNOSIS — R6510 Systemic inflammatory response syndrome (SIRS) of non-infectious origin without acute organ dysfunction: Secondary | ICD-10-CM | POA: Diagnosis not present

## 2016-08-31 DIAGNOSIS — R509 Fever, unspecified: Secondary | ICD-10-CM | POA: Diagnosis not present

## 2016-08-31 DIAGNOSIS — R7881 Bacteremia: Secondary | ICD-10-CM | POA: Diagnosis not present

## 2016-08-31 DIAGNOSIS — I959 Hypotension, unspecified: Secondary | ICD-10-CM | POA: Diagnosis not present

## 2016-08-31 DIAGNOSIS — I081 Rheumatic disorders of both mitral and tricuspid valves: Secondary | ICD-10-CM | POA: Diagnosis present

## 2016-08-31 DIAGNOSIS — N390 Urinary tract infection, site not specified: Secondary | ICD-10-CM | POA: Diagnosis present

## 2016-08-31 DIAGNOSIS — M6281 Muscle weakness (generalized): Secondary | ICD-10-CM | POA: Diagnosis not present

## 2016-08-31 DIAGNOSIS — R251 Tremor, unspecified: Secondary | ICD-10-CM | POA: Diagnosis not present

## 2016-08-31 DIAGNOSIS — B9689 Other specified bacterial agents as the cause of diseases classified elsewhere: Secondary | ICD-10-CM | POA: Diagnosis not present

## 2016-08-31 DIAGNOSIS — I10 Essential (primary) hypertension: Secondary | ICD-10-CM | POA: Diagnosis present

## 2016-08-31 DIAGNOSIS — E785 Hyperlipidemia, unspecified: Secondary | ICD-10-CM | POA: Diagnosis present

## 2016-08-31 DIAGNOSIS — E872 Acidosis: Secondary | ICD-10-CM | POA: Diagnosis present

## 2016-08-31 DIAGNOSIS — I340 Nonrheumatic mitral (valve) insufficiency: Secondary | ICD-10-CM | POA: Diagnosis not present

## 2016-08-31 DIAGNOSIS — G8929 Other chronic pain: Secondary | ICD-10-CM | POA: Diagnosis present

## 2016-08-31 DIAGNOSIS — Z66 Do not resuscitate: Secondary | ICD-10-CM | POA: Diagnosis not present

## 2016-08-31 DIAGNOSIS — R0682 Tachypnea, not elsewhere classified: Secondary | ICD-10-CM | POA: Diagnosis not present

## 2016-08-31 DIAGNOSIS — G9340 Encephalopathy, unspecified: Secondary | ICD-10-CM | POA: Diagnosis not present

## 2016-08-31 DIAGNOSIS — Z7189 Other specified counseling: Secondary | ICD-10-CM

## 2016-08-31 DIAGNOSIS — E7220 Disorder of urea cycle metabolism, unspecified: Secondary | ICD-10-CM | POA: Diagnosis present

## 2016-08-31 DIAGNOSIS — R262 Difficulty in walking, not elsewhere classified: Secondary | ICD-10-CM | POA: Diagnosis not present

## 2016-08-31 DIAGNOSIS — R9431 Abnormal electrocardiogram [ECG] [EKG]: Secondary | ICD-10-CM | POA: Diagnosis not present

## 2016-08-31 DIAGNOSIS — J9600 Acute respiratory failure, unspecified whether with hypoxia or hypercapnia: Secondary | ICD-10-CM | POA: Diagnosis present

## 2016-08-31 DIAGNOSIS — R488 Other symbolic dysfunctions: Secondary | ICD-10-CM | POA: Diagnosis not present

## 2016-08-31 DIAGNOSIS — A419 Sepsis, unspecified organism: Secondary | ICD-10-CM | POA: Diagnosis not present

## 2016-08-31 DIAGNOSIS — D72829 Elevated white blood cell count, unspecified: Secondary | ICD-10-CM | POA: Diagnosis present

## 2016-08-31 DIAGNOSIS — Z79899 Other long term (current) drug therapy: Secondary | ICD-10-CM

## 2016-08-31 DIAGNOSIS — R6521 Severe sepsis with septic shock: Secondary | ICD-10-CM | POA: Diagnosis present

## 2016-08-31 DIAGNOSIS — R278 Other lack of coordination: Secondary | ICD-10-CM | POA: Diagnosis not present

## 2016-08-31 DIAGNOSIS — J189 Pneumonia, unspecified organism: Secondary | ICD-10-CM | POA: Diagnosis not present

## 2016-08-31 DIAGNOSIS — R401 Stupor: Secondary | ICD-10-CM

## 2016-08-31 LAB — CBC
HCT: 36.4 % (ref 36.0–46.0)
HEMOGLOBIN: 11.2 g/dL — AB (ref 12.0–15.0)
MCH: 27.9 pg (ref 26.0–34.0)
MCHC: 30.8 g/dL (ref 30.0–36.0)
MCV: 90.5 fL (ref 78.0–100.0)
Platelets: 336 10*3/uL (ref 150–400)
RBC: 4.02 MIL/uL (ref 3.87–5.11)
RDW: 14.8 % (ref 11.5–15.5)
WBC: 12.2 10*3/uL — ABNORMAL HIGH (ref 4.0–10.5)

## 2016-08-31 LAB — DIFFERENTIAL
Basophils Absolute: 0 10*3/uL (ref 0.0–0.1)
Basophils Relative: 0 %
EOS ABS: 0 10*3/uL (ref 0.0–0.7)
EOS PCT: 0 %
LYMPHS PCT: 8 %
Lymphs Abs: 1 10*3/uL (ref 0.7–4.0)
Monocytes Absolute: 0.1 10*3/uL (ref 0.1–1.0)
Monocytes Relative: 1 %
NEUTROS ABS: 11.1 10*3/uL — AB (ref 1.7–7.7)
Neutrophils Relative %: 91 %

## 2016-08-31 LAB — AMMONIA: Ammonia: 76 umol/L — ABNORMAL HIGH (ref 9–35)

## 2016-08-31 LAB — URINALYSIS, ROUTINE W REFLEX MICROSCOPIC
Bilirubin Urine: NEGATIVE
Ketones, ur: NEGATIVE mg/dL
Leukocytes, UA: NEGATIVE
NITRITE: POSITIVE — AB
PH: 5 (ref 5.0–8.0)
Protein, ur: NEGATIVE mg/dL
Specific Gravity, Urine: 1.022 (ref 1.005–1.030)

## 2016-08-31 LAB — COMPREHENSIVE METABOLIC PANEL
ALK PHOS: 168 U/L — AB (ref 38–126)
ALT: 23 U/L (ref 14–54)
AST: 32 U/L (ref 15–41)
Albumin: 1.8 g/dL — ABNORMAL LOW (ref 3.5–5.0)
Anion gap: 11 (ref 5–15)
BILIRUBIN TOTAL: 1.6 mg/dL — AB (ref 0.3–1.2)
BUN: 12 mg/dL (ref 6–20)
CALCIUM: 9.1 mg/dL (ref 8.9–10.3)
CO2: 26 mmol/L (ref 22–32)
Chloride: 103 mmol/L (ref 101–111)
Creatinine, Ser: 0.69 mg/dL (ref 0.44–1.00)
GFR calc non Af Amer: >60 mL/min (ref >60)
Glucose, Bld: 336 mg/dL — ABNORMAL HIGH (ref 65–99)
Potassium: 4.1 mmol/L (ref 3.5–5.1)
Sodium: 140 mmol/L (ref 135–145)
TOTAL PROTEIN: 7.5 g/dL (ref 6.5–8.1)

## 2016-08-31 LAB — I-STAT CG4 LACTIC ACID, ED
LACTIC ACID, VENOUS: 6.29 mmol/L — AB (ref 0.5–1.9)
Lactic Acid, Venous: 4.15 mmol/L (ref 0.5–1.9)

## 2016-08-31 LAB — ETHANOL: Alcohol, Ethyl (B): <5 mg/dL (ref <5)

## 2016-08-31 LAB — C DIFFICILE QUICK SCREEN W PCR REFLEX
C Diff antigen: NEGATIVE
C Diff interpretation: NOT DETECTED
C Diff toxin: NEGATIVE

## 2016-08-31 LAB — I-STAT ARTERIAL BLOOD GAS, ED
ACID-BASE EXCESS: 2 mmol/L (ref 0.0–2.0)
BICARBONATE: 26.8 mmol/L (ref 20.0–28.0)
O2 SAT: 100 %
PO2 ART: 167 mmHg — AB (ref 83.0–108.0)
Patient temperature: 98
TCO2: 28 mmol/L (ref 0–100)
pCO2 arterial: 42.9 mmHg (ref 32.0–48.0)
pH, Arterial: 7.401 (ref 7.350–7.450)

## 2016-08-31 LAB — PROTIME-INR
INR: 1.33
PROTHROMBIN TIME: 16.6 s — AB (ref 11.4–15.2)

## 2016-08-31 LAB — RAPID URINE DRUG SCREEN, HOSP PERFORMED
Amphetamines: NOT DETECTED
BARBITURATES: NOT DETECTED
BENZODIAZEPINES: NOT DETECTED
Cocaine: NOT DETECTED
Opiates: NOT DETECTED
Tetrahydrocannabinol: NOT DETECTED

## 2016-08-31 LAB — APTT: aPTT: 26 seconds (ref 24–36)

## 2016-08-31 LAB — I-STAT TROPONIN, ED: TROPONIN I, POC: 0.02 ng/mL (ref 0.00–0.08)

## 2016-08-31 LAB — CBG MONITORING, ED: GLUCOSE-CAPILLARY: 333 mg/dL — AB (ref 65–99)

## 2016-08-31 MED ORDER — SODIUM CHLORIDE 0.9 % IV SOLN: 250.0000 mL | INTRAVENOUS | Status: DC | PRN

## 2016-08-31 MED ORDER — DILTIAZEM HCL 25 MG/5ML IV SOLN
10.0000 mg | Freq: Once | INTRAVENOUS | Status: AC
  Administered 2016-08-31: 10 mg via INTRAVENOUS
  Filled 2016-08-31: qty 5

## 2016-08-31 MED ORDER — DEXTROSE 5 % IV SOLN
500.0000 mg | INTRAVENOUS | Status: DC
  Administered 2016-08-31 – 2016-09-01 (×2): 500 mg via INTRAVENOUS
  Filled 2016-08-31 (×2): qty 500

## 2016-08-31 MED ORDER — PIPERACILLIN-TAZOBACTAM 3.375 G IVPB 30 MIN
3.3750 g | Freq: Once | INTRAVENOUS | Status: AC
  Administered 2016-08-31: 3.375 g via INTRAVENOUS
  Filled 2016-08-31: qty 50

## 2016-08-31 MED ORDER — HEPARIN SODIUM (PORCINE) 5000 UNIT/ML IJ SOLN: 5000.0000 [IU] | Freq: Three times a day (TID) | INTRAMUSCULAR | Status: DC

## 2016-08-31 MED ORDER — VANCOMYCIN HCL IN DEXTROSE 1-5 GM/200ML-% IV SOLN
1000.0000 mg | Freq: Once | INTRAVENOUS | Status: AC
  Administered 2016-08-31: 1000 mg via INTRAVENOUS
  Filled 2016-08-31: qty 200

## 2016-08-31 MED ORDER — VANCOMYCIN HCL 10 G IV SOLR
1250.0000 mg | Freq: Two times a day (BID) | INTRAVENOUS | Status: DC
  Administered 2016-09-01: 1250 mg via INTRAVENOUS
  Filled 2016-08-31: qty 1250

## 2016-08-31 MED ORDER — SODIUM CHLORIDE 0.9 % IV BOLUS (SEPSIS)
1000.0000 mL | Freq: Once | INTRAVENOUS | Status: AC
Start: 2016-08-31 — End: 2016-08-31
  Administered 2016-08-31: 1000 mL via INTRAVENOUS

## 2016-08-31 MED ORDER — PANTOPRAZOLE SODIUM 40 MG IV SOLR: 40.0000 mg | Freq: Every day | INTRAVENOUS | Status: DC

## 2016-08-31 MED ORDER — INSULIN ASPART 100 UNIT/ML ~~LOC~~ SOLN
0.0000 [IU] | SUBCUTANEOUS | Status: DC
  Administered 2016-09-01: 15 [IU] via SUBCUTANEOUS
  Administered 2016-09-01: 3 [IU] via SUBCUTANEOUS
  Administered 2016-09-01 – 2016-09-02 (×3): 5 [IU] via SUBCUTANEOUS
  Administered 2016-09-02: 3 [IU] via SUBCUTANEOUS
  Administered 2016-09-02: 5 [IU] via SUBCUTANEOUS
  Administered 2016-09-02 (×2): 3 [IU] via SUBCUTANEOUS
  Administered 2016-09-02: 5 [IU] via SUBCUTANEOUS
  Administered 2016-09-03: 3 [IU] via SUBCUTANEOUS
  Administered 2016-09-03: 2 [IU] via SUBCUTANEOUS
  Administered 2016-09-03: 3 [IU] via SUBCUTANEOUS
  Administered 2016-09-03: 2 [IU] via SUBCUTANEOUS

## 2016-08-31 MED ORDER — SODIUM CHLORIDE 0.9 % IV BOLUS (SEPSIS)
1000.0000 mL | Freq: Once | INTRAVENOUS | Status: AC
  Administered 2016-08-31: 1000 mL via INTRAVENOUS

## 2016-08-31 MED ORDER — ACETAMINOPHEN 325 MG PO TABS
650.0000 mg | ORAL_TABLET | Freq: Once | ORAL | Status: DC
  Filled 2016-08-31: qty 2

## 2016-08-31 MED ORDER — PIPERACILLIN-TAZOBACTAM 3.375 G IVPB
3.3750 g | Freq: Three times a day (TID) | INTRAVENOUS | Status: DC
  Administered 2016-09-01 – 2016-09-02 (×5): 3.375 g via INTRAVENOUS
  Filled 2016-08-31 (×6): qty 50

## 2016-08-31 MED ORDER — LORAZEPAM 2 MG/ML IJ SOLN
1.0000 mg | Freq: Once | INTRAMUSCULAR | Status: AC
  Administered 2016-08-31: 1 mg via INTRAVENOUS
  Filled 2016-08-31: qty 1

## 2016-08-31 NOTE — H&P (Signed)
[facility] History and Physical  Toni [name] [id number] DOB: [birth year] DOA: 08/31/2016  Referring physician: Dr. Tomi Bamberger PCP: Elyn Peers, MD   Chief Complaint: Altered mental status  HPI: Toni [name] is a 83 y.o. female with history of HTN presenting to ED with altered MS for about 2-3 days.  Per family she had a cough 2 weeks ago, then got a flu shot last week.  The last few days has gotten very weak and now is confused.  In ED patient was confused and poorly responsive on arrival.  CXR showed RLL changes, temp was 103 rectal.  She rec'd IVF"s and IV abx and is "looking better" per ER MD. We are asked to admit for AMS/ PNA.  After evaluating pt I called CCM to evaluate and they said she does not need ICU at this time.    Patient provides no history.  Daughter and sister here and don''t have much history either except progressive weakness the last few days.  No old admits here. Home meds > verapamil, Zocor and asa.  PSH hx of bilat TKR, ysterectomy and ectopic pregnancy surgery.       ROS  n/a   Past Medical History  Past Medical History:  Diagnosis Date  . Diabetes mellitus without complication ([place])   . Peripheral vascular disease ([place])   . Plantar fascial fibromatosis    Past Surgical History  Past Surgical History:  Procedure Laterality Date  . ABDOMINAL HYSTERECTOMY     partial  . ECTOPIC PREGNANCY SURGERY    . JOINT REPLACEMENT Bilateral    TKR   Family History  Family History  Problem Relation Age of Onset  . Hypertension Mother   . Hyperlipidemia Mother     before age 12  . Heart disease Father   . Hypertension Father   . Hyperlipidemia Father     before age 57  . Diabetes Sister   . Other Sister     amputation   Social History  reports that she has never smoked. She has never used smokeless tobacco. She reports that she does not drink alcohol or use drugs. Allergies No Known Allergies Home medications Prior to Admission medications    Medication Sig Start Date End Date Taking? Authorizing Provider  acetaminophen (TYLENOL) 325 MG tablet Take 325-650 mg by mouth every 6 (six) hours as needed (for knee pain).   Yes Historical Provider, MD  aspirin 81 MG tablet Take 81 mg by mouth daily.   Yes Historical Provider, MD  Multiple Vitamins-Minerals (CENTRUM SILVER ADULT 50+ PO) Take 1 tablet by mouth daily.   Yes Historical Provider, MD  Polyethyl Glycol-Propyl Glycol (SYSTANE ULTRA OP) Apply 1-2 drops to eye 2 (two) times daily as needed (for dry eyes).    Yes Historical Provider, MD  simvastatin (ZOCOR) 40 MG tablet Take 40 mg by mouth daily.   Yes Historical Provider, MD  verapamil (VERELAN PM) 180 MG 24 hr capsule Take 180 mg by mouth daily.   Yes Historical Provider, MD   Liver Function Tests  Recent Labs Lab 08/31/16 1648  AST 32  ALT 23  ALKPHOS 168*  BILITOT 1.6*  PROT 7.5  ALBUMIN 1.8*   No results for input(s): LIPASE, AMYLASE in the last 168 hours. CBC  Recent Labs Lab 08/31/16 1648  WBC 12.2*  NEUTROABS 11.1*  HGB 11.2*  HCT 36.4  MCV 90.5  PLT 123456   Basic Metabolic Panel  Recent Labs Lab 08/31/16 1648  NA 140  K 4.1  CL 103  CO2 26  GLUCOSE 336*  BUN 12  CREATININE 0.69  CALCIUM 9.1     Vitals:   08/31/16 2030 08/31/16 2045 08/31/16 2100 08/31/16 2115  BP: 113/67 110/69 103/64 119/71  Pulse:  105 106 106  Resp: (!) 39 (!) 35 (!) 33 (!) 27  Temp:      TempSrc:      SpO2:  100% 97% 100%  Weight:      Height:       Exam: Gen elderly AAF, minimally responsive at first exam.  About 1 hour later re-examined and is moving herself about the stretcher now, still confused but waking up compared to presentation No rash, cyanosis or gangrene Sclera anicteric, throat clear No jvd or bruits Chest dec'd BS R base, L clear  RRR no MRG Abd soft ntnd no mass or ascites +bs GU defer MS healed scars bilat knees Ext no LE or UE edema / no wounds or ulcers Neuro is lethargic and makes  minimal verbalization   ABG 7.4/ 42/ 167 Na 140 K 4.1  CO2 26  Glu 336  Creat 0.69 BUN 12  Alb 1.8  LFT's ok except alkphos 168  Tbili 1.6 NH3 = 76 Lactic acid 6.2 > 4.1 WBC 12k  Hb 11.2 UA > 6-30 wbc/ 0-5 rbc, +nitrite, neg LE, many bact  EKG (independ reviewed) > tachy, afib with no ischemic changes, HR 115 CXR (independ reviewed) > pathcy infiltrates L base and R base obscured as well   Assessment: 1. Altered mental status/ fever/ SIRS - has pulm infiltrates, recent cough/ flu shot. Suspect PNA.  Also has some mild ^LFT's and hyperammonemia, not sure significance but should get CT abdomen when more stable.  Also has pyuria. Started on broad spec abx. Lact acid improving and pt's MS improved in ED with IVFs and IV abx.  Plan admit SDU.  Per family full code for now.   2. DM/ hyperglycemia - high BS, not on diabetic meds it seems, get more history later. SSI for now 3. HTN - BP's soft , hold verapamil 4. Pyuria - can't r/o UTI, culture pending 5. Tachycardia - not sure afib on 1st EKG.  Sinus rhythm now.    Plan - Continue IVF"s, IV abx vanc/ zosyn.  Start SSI.       Roney Jaffe D [facility] Pager [phone]   If 7PM-7AM, please contact night-coverage [email or web address] Password [email or web address] 08/31/2016, 9:54 PM

## 2016-08-31 NOTE — ED Provider Notes (Addendum)
[facility] DEPT Provider Note   CSN: [id number] Arrival date & time: 08/31/16  1539     History   Chief Complaint Chief Complaint  Patient presents with  . Altered Mental Status    HPI OZETTA FRARY is a 83 y.o. female. L5 caveat: Altered mental status HPI Patient presents to the emergency room for evaluation of altered mental status.  Patient is unable to answer any questions and all the history was provided by the patient's daughter. According to the daughter, the patient was in her usual state of health yesterday. She was not having any specific complaints.  This morning they noticed that the patient was confused and tremulous. She was not answering any questions. Patient's granddaughter called 35 and the patient was brought to the emergency room.  Past Medical History:  Diagnosis Date  . Diabetes mellitus without complication ([place])   . Peripheral vascular disease ([place])   . Plantar fascial fibromatosis     Patient Active Problem List   Diagnosis Date Noted  . Other symptoms involving cardiovascular system 11/02/2012  . Peripheral vascular disease, unspecified 11/02/2012    Past Surgical History:  Procedure Laterality Date  . ABDOMINAL HYSTERECTOMY     partial  . ECTOPIC PREGNANCY SURGERY    . JOINT REPLACEMENT Bilateral    TKR    OB History    No data available       Home Medications    Prior to Admission medications   Medication Sig Start Date End Date Taking? Authorizing Provider  acetaminophen (TYLENOL) 325 MG tablet Take 325-650 mg by mouth every 6 (six) hours as needed (for knee pain).   Yes Historical Provider, MD  aspirin 81 MG tablet Take 81 mg by mouth daily.   Yes Historical Provider, MD  Multiple Vitamins-Minerals (CENTRUM SILVER ADULT 50+ PO) Take 1 tablet by mouth daily.   Yes Historical Provider, MD  Polyethyl Glycol-Propyl Glycol (SYSTANE ULTRA OP) Apply 1-2 drops to eye 2 (two) times daily as needed (for dry eyes).    Yes Historical  Provider, MD  simvastatin (ZOCOR) 40 MG tablet Take 40 mg by mouth daily.   Yes Historical Provider, MD  verapamil (CALAN-SR) 180 MG CR tablet Take 180 mg by mouth daily.   Yes Historical Provider, MD    Family History Family History  Problem Relation Age of Onset  . Hypertension Mother   . Hyperlipidemia Mother     before age 22  . Heart disease Father   . Hypertension Father   . Hyperlipidemia Father     before age 62  . Diabetes Sister   . Other Sister     amputation    Social History Social History  Substance Use Topics  . Smoking status: Never Smoker  . Smokeless tobacco: Never Used  . Alcohol use No     Allergies   Patient has no known allergies.   Review of Systems Review of Systems  Unable to perform ROS: Mental status change     Physical Exam Updated Vital Signs BP 108/61   Pulse (!) 57   Temp (!) 103.3 F (39.6 C) (Rectal)   Resp 22   Ht 5\' 2"  (1.575 m)   Wt 99.8 kg   BMI 40.24 kg/m   Physical Exam  HENT:  Head: Normocephalic and atraumatic. Head is without raccoon's eyes.  Eyes: Conjunctivae and lids are normal. Right eye exhibits no chemosis, no discharge and no exudate. Left eye exhibits no chemosis, no discharge and no  exudate. Pupils are equal.  Neck: No thyroid mass present.  Cardiovascular: An irregularly irregular rhythm present. Tachycardia present.   Pulmonary/Chest: Breath sounds normal. No apnea, no tachypnea and no bradypnea. No respiratory distress.  Abdominal: Soft. Normal appearance and bowel sounds are normal. There is no tenderness.  Musculoskeletal: She exhibits edema (trace edema in the ankles bilaterally). She exhibits no tenderness or deformity.  Neurological: She is alert.  Pt is sitting in bed with her eyes open, she is tremulous, specifically in her forehead area, will not answer questions or respond to commands,   Skin: She is not diaphoretic.     ED Treatments / Results  Labs (all labs ordered are listed, but  only abnormal results are displayed) Labs Reviewed  PROTIME-INR - Abnormal; Notable for the following:       Result Value   Prothrombin Time 16.6 (*)    All other components within normal limits  CBC - Abnormal; Notable for the following:    WBC 12.2 (*)    Hemoglobin 11.2 (*)    All other components within normal limits  DIFFERENTIAL - Abnormal; Notable for the following:    Neutro Abs 11.1 (*)    All other components within normal limits  COMPREHENSIVE METABOLIC PANEL - Abnormal; Notable for the following:    Glucose, Bld 336 (*)    Albumin 1.8 (*)    Alkaline Phosphatase 168 (*)    Total Bilirubin 1.6 (*)    All other components within normal limits  URINALYSIS, ROUTINE W REFLEX MICROSCOPIC - Abnormal; Notable for the following:    Color, Urine AMBER (*)    APPearance HAZY (*)    Glucose, UA >=500 (*)    Hgb urine dipstick MODERATE (*)    Nitrite POSITIVE (*)    Bacteria, UA MANY (*)    Squamous Epithelial / LPF 0-5 (*)    All other components within normal limits  AMMONIA - Abnormal; Notable for the following:    Ammonia 76 (*)    All other components within normal limits  CBG MONITORING, ED - Abnormal; Notable for the following:    Glucose-Capillary 333 (*)    All other components within normal limits  I-STAT CG4 LACTIC ACID, ED - Abnormal; Notable for the following:    Lactic Acid, Venous 6.29 (*)    All other components within normal limits  I-STAT CG4 LACTIC ACID, ED - Abnormal; Notable for the following:    Lactic Acid, Venous 4.15 (*)    All other components within normal limits  C DIFFICILE QUICK SCREEN W PCR REFLEX  CULTURE, BLOOD (ROUTINE X 2)  CULTURE, BLOOD (ROUTINE X 2)  URINE CULTURE  ETHANOL  APTT  RAPID URINE DRUG SCREEN, HOSP PERFORMED  I-STAT TROPOININ, ED  I-STAT ARTERIAL BLOOD GAS, ED    EKG  EKG Interpretation  Date/Time:  Tuesday August 31 2016 15:56:40 EST Ventricular Rate:  166 PR Interval:    QRS Duration: 75 QT  Interval:  284 QTC Calculation: 459 R Axis:   59 Text Interpretation:  Atrial fibrillation with rapid V-rate Ventricular tachycardia, unsustained Repolarization abnormality, prob rate related Baseline wander in lead(s) I III aVL No previous tracing Confirmed by Zakari Bathe  MD-J, Billiejean Schimek [id number]) on 08/31/2016 3:58:36 PM       Radiology Ct Head Wo Contrast  Result Date: 08/31/2016 CLINICAL DATA:  Altered mental status EXAM: CT HEAD WITHOUT CONTRAST TECHNIQUE: Contiguous axial images were obtained from the base of the skull through the vertex without intravenous  contrast. COMPARISON:  None. FINDINGS: Brain: No mass lesion, intraparenchymal hemorrhage or extra-axial collection. No evidence of acute cortical infarct. There is periventricular hypoattenuation compatible with chronic microvascular disease. There is bilateral basal ganglia mineralization. Vascular: No hyperdense vessel or unexpected calcification. Skull: Normal visualized skull base, calvarium and extracranial soft tissues. Sinuses/Orbits: No sinus fluid levels or advanced mucosal thickening. No mastoid effusion. Normal orbits. IMPRESSION: Chronic microvascular ischemia without acute intracranial abnormality. Electronically Signed   By: Ulyses Jarred M.D.   On: 08/31/2016 18:27   Dg Chest Portable 1 View  Result Date: 08/31/2016 CLINICAL DATA:  New onset of tremors. Altered mental status. Initial encounter. EXAM: PORTABLE CHEST 1 VIEW COMPARISON:  Chest radiograph performed 03/31/2011 FINDINGS: The lungs are hypoexpanded. Mild patchy left central airspace opacities may reflect pneumonia, depending on the patient's symptoms. There is no evidence of pleural effusion or pneumothorax. The cardiomediastinal silhouette is borderline normal in size. No acute osseous abnormalities are seen. IMPRESSION: Lungs hypoexpanded. Mild patchy left central airspace opacities may reflect mild pneumonia, depending on the patient's symptoms. Electronically Signed   By:  Garald Balding M.D.   On: 08/31/2016 17:14    Procedures .Critical Care Performed by: Dorie Rank Authorized by: Dorie Rank   Critical care provider statement:    Critical care time (minutes):  45   Critical care was time spent personally by me on the following activities:  Discussions with consultants, evaluation of patient's response to treatment, examination of patient, ordering and performing treatments and interventions, ordering and review of laboratory studies, ordering and review of radiographic studies, pulse oximetry, re-evaluation of patient's condition, obtaining history from patient or surrogate and review of old charts      (including critical care time)  Medications Ordered in ED Medications  acetaminophen (TYLENOL) tablet 650 mg (650 mg Oral Not Given 08/31/16 1717)  piperacillin-tazobactam (ZOSYN) IVPB 3.375 g (not administered)  vancomycin (VANCOCIN) 1,250 mg in sodium chloride 0.9 % 250 mL IVPB (not administered)  diltiazem (CARDIZEM) injection 10 mg (10 mg Intravenous Given 08/31/16 1625)  LORazepam (ATIVAN) injection 1 mg (1 mg Intravenous Given 08/31/16 1624)  sodium chloride 0.9 % bolus 1,000 mL (0 mLs Intravenous Stopped 08/31/16 1927)    And  sodium chloride 0.9 % bolus 1,000 mL (1,000 mLs Intravenous New Bag/Given 08/31/16 1754)    And  sodium chloride 0.9 % bolus 1,000 mL (1,000 mLs Intravenous New Bag/Given 08/31/16 1927)  piperacillin-tazobactam (ZOSYN) IVPB 3.375 g (0 g Intravenous Stopped 08/31/16 1746)  vancomycin (VANCOCIN) IVPB 1000 mg/200 mL premix (0 mg Intravenous Stopped 08/31/16 1854)     Initial Impression / Assessment and Plan / ED Course  I have reviewed the triage vital signs and the nursing notes.  Pertinent labs & imaging results that were available during my care of the patient were reviewed by me and considered in my medical decision making (see chart for details).  Clinical Course as of Aug 31 2008  Tue Aug 31, 2016  1611 A fib with RVR  noted.  New onset tremulousness with altered mental status.  Atypical for seizure pattern but possible etiology.  Will give dose of cardizem for her tachcycardia.   Will try dose of ativan as well for possible seizure  [JK]  1653 Notified that pt's temp is 103 and she had a watery stool.   Will give fluid bolus, initiate sepsis order set for possible sepsis.   Add on C Diff  [JK]  1719 Unable to swallow the tylenol.  Having  diarrhea so rectal tylenol will not work.  [id number] REviewed monitor.  Tachycardia has decreased.  Appears to be a sinus tach with first degree av block at this time.  Rate is around 115  [JK]  1758 BP remains stable.  HR decreased.  Lactic acid elevated.  Concerning for sepsis.  Will consult with medical service for admission, further monitoring and treatment.  [JK]  1858 Sepsis - Repeat Assessment  Performed at:    6:59 PM   Vitals     @VITALSNOTREFRESHABLE @  Heart:     Regular rate and rhythm  Lungs:    CTA  Capillary Refill:   <2 sec  Peripheral Pulse:   Radial pulse palpable  Skin:     Normal Color  [JK]  2008 Discussed with Dr Jonnie Finner about possible intubation.  Pt is more alert than I initially saw her.  She is still confused but she answered my question.  She states she is feeling good.   [JK]  2009 Will check ABG.  O2 sat is 100%.  Monitor closely.  I do not feel she requires emergent intubation at this time.  [JK]    Clinical Course User Index [JK] Dorie Rank, MD    The patient's evaluation in the ED is notable for fever, leukocytosis, and tachycardia. She has an elevated lactic acid level of 6.29. Urinalysis suggests the possibility of a urinary tract infection. Chest x-ray also suggest the possibility of atelectasis versus pneumonia.  Patient also had loose stools here so C. difficile study was sent off for analysis. Patient was started empirically on broad-spectrum antibiotics. Fluid bolus was ordered. Given a dose of Cardizem for what appeared to be atrial  fibrillation but now on the monitor she appears to be in sinus tachycardia.  Plan on admission to the hospital for further treatment and evaluation.  Final Clinical Impressions(s) / ED Diagnoses   Final diagnoses:  Sepsis, due to unspecified organism [facility]: Lambert Rhonda W)  Altered mental status, unspecified altered mental status type  Hyperammonemia ([place])  Hyperglycemia  Diarrhea, unspecified type     Dorie Rank, MD 08/31/16 1823    Dorie Rank, MD 08/31/16 [place], MD 08/31/16 2010

## 2016-08-31 NOTE — ED Notes (Signed)
IV team at bedside 

## 2016-08-31 NOTE — ED Triage Notes (Signed)
Pt arrived via EMS from home c/o new onset tremors.

## 2016-08-31 NOTE — Progress Notes (Signed)
Called to see patient Toni [name] in E47.  She is poorly responsive , awakens and falls back asleep.  Clammy to touch , BP's soft and respirations rapid 30-35 and shallow. WBC 12k , LA 6.2 > 4.1 .  Not comfortable with SDU for this patient, have asked CCM to evaluate and have also d/w ED MD.  Will get ABG for now, cont IVF"s and abx.  She likely has a RLL PNA with CXR changes and dec'd BS R base.  Has had a cough but this was a couple wks ago per family, got flu shot last week, no other recent symptoms per family and pt not responding verbally.     Kelly Splinter MD [employer] pgr [phone]   08/31/2016, 8:11 PM

## 2016-08-31 NOTE — H&P (Signed)
PULMONARY / CRITICAL CARE MEDICINE   Name: Toni [name] MRN: [id number] DOB: 1933/06/01    ADMISSION DATE:  08/31/2016 CONSULTATION DATE:  2/13  REFERRING MD:  Dr. Tomi Bamberger EDP  CHIEF COMPLAINT:  AMS  HISTORY OF PRESENT ILLNESS:   83 year old female with PMH as below, which is significant for DM, HTN, and HLD. At baseline she is essentially bed/chair ridden due to knee pain s/p several replacement surgeries. She is able to ambulate very short distance with walker and requires assistance with almost all ADLs, but is apparently mentally intact and able to converse with family and make phone calls to friends. Her grandchildren live with her. She was in her usual state of health when her daughter left the night of 2/12, and since that time her mental status worsened. Initially she would only be able to mumble, and then ultimately became unresponsive and tremulous. She was brought to ED for further evaluation where exam was notable for fever, leukocytosis, and tachycardia. Laboratory evaluation significant for Lactic acid 6.29 and urine with many bacteria and nitrite positive.  CXR also concerning for PNA. She was started on empiric ABX and due to poor mental status PCCM asked to see.   PAST MEDICAL HISTORY :  She  has a past medical history of Diabetes mellitus without complication ([place]); Peripheral vascular disease ([place]); and Plantar fascial fibromatosis.  PAST SURGICAL HISTORY: She  has a past surgical history that includes Abdominal hysterectomy; Ectopic pregnancy surgery; and Joint replacement (Bilateral).  No Known Allergies  No current facility-administered medications on file prior to encounter.    Current Outpatient Prescriptions on File Prior to Encounter  Medication Sig  . aspirin 81 MG tablet Take 81 mg by mouth daily.  . Multiple Vitamins-Minerals (CENTRUM SILVER ADULT 50+ PO) Take 1 tablet by mouth daily.  Vladimir Faster Glycol-Propyl Glycol (SYSTANE ULTRA OP) Apply 1-2 drops to eye  2 (two) times daily as needed (for dry eyes).   . simvastatin (ZOCOR) 40 MG tablet Take 40 mg by mouth daily.    FAMILY HISTORY:  Her indicated that the status of her mother is unknown. She indicated that the status of her father is unknown. She indicated that the status of her sister is unknown.    SOCIAL HISTORY: She  reports that she has never smoked. She has never used smokeless tobacco. She reports that she does not drink alcohol or use drugs.  REVIEW OF SYSTEMS:   Unable due to encephalopahty  SUBJECTIVE:    VITAL SIGNS: BP 119/71   Pulse 106   Temp (!) 103.3 F (39.6 C) (Rectal)   Resp (!) 27   Ht 5\' 2"  (1.575 m)   Wt 99.8 kg (220 lb)   SpO2 100%   BMI 40.24 kg/m   HEMODYNAMICS:    VENTILATOR SETTINGS:    INTAKE / OUTPUT: I/O last 3 completed shifts: In: 250 [IV Piggyback:250] Out: -   PHYSICAL EXAMINATION: General:  Frail elderly female in NAD Neuro:  Spontaneously awake, alert to self only. (home, [place], [place])  HEENT:  [place]/AT, no JVD Cardiovascular:  RRR, no MRG Lungs:  Clear bilateral breath sounds Abdomen:  Soft, non-tender, non-distended Musculoskeletal:  No acute deformity Skin:  Remote surgical scars to bilateral knees.   LABS:  BMET  Recent Labs Lab 08/31/16 1648  NA 140  K 4.1  CL 103  CO2 26  BUN 12  CREATININE 0.69  GLUCOSE 336*    Electrolytes  Recent Labs Lab 08/31/16 1648  CALCIUM 9.1    CBC  Recent Labs Lab 08/31/16 1648  WBC 12.2*  HGB 11.2*  HCT 36.4  PLT 336    Coag's  Recent Labs Lab 08/31/16 1648  APTT 26  INR 1.33    Sepsis Markers  Recent Labs Lab 08/31/16 1708 08/31/16 1944  LATICACIDVEN 6.29* 4.15*    ABG  Recent Labs Lab 08/31/16 2036  PHART 7.401  PCO2ART 42.9  PO2ART 167.0*    Liver Enzymes  Recent Labs Lab 08/31/16 1648  AST 32  ALT 23  ALKPHOS 168*  BILITOT 1.6*  ALBUMIN 1.8*    Cardiac Enzymes No results for input(s): TROPONINI, PROBNP in the last 168  hours.  Glucose  Recent Labs Lab 08/31/16 1623  GLUCAP 333*    Imaging Ct Head Wo Contrast  Result Date: 08/31/2016 CLINICAL DATA:  Altered mental status EXAM: CT HEAD WITHOUT CONTRAST TECHNIQUE: Contiguous axial images were obtained from the base of the skull through the vertex without intravenous contrast. COMPARISON:  None. FINDINGS: Brain: No mass lesion, intraparenchymal hemorrhage or extra-axial collection. No evidence of acute cortical infarct. There is periventricular hypoattenuation compatible with chronic microvascular disease. There is bilateral basal ganglia mineralization. Vascular: No hyperdense vessel or unexpected calcification. Skull: Normal visualized skull base, calvarium and extracranial soft tissues. Sinuses/Orbits: No sinus fluid levels or advanced mucosal thickening. No mastoid effusion. Normal orbits. IMPRESSION: Chronic microvascular ischemia without acute intracranial abnormality. Electronically Signed   By: Ulyses Jarred M.D.   On: 08/31/2016 18:27   Dg Chest Portable 1 View  Result Date: 08/31/2016 CLINICAL DATA:  New onset of tremors. Altered mental status. Initial encounter. EXAM: PORTABLE CHEST 1 VIEW COMPARISON:  Chest radiograph performed 03/31/2011 FINDINGS: The lungs are hypoexpanded. Mild patchy left central airspace opacities may reflect pneumonia, depending on the patient's symptoms. There is no evidence of pleural effusion or pneumothorax. The cardiomediastinal silhouette is borderline normal in size. No acute osseous abnormalities are seen. IMPRESSION: Lungs hypoexpanded. Mild patchy left central airspace opacities may reflect mild pneumonia, depending on the patient's symptoms. Electronically Signed   By: Garald Balding M.D.   On: 08/31/2016 17:14     STUDIES:  CT head 2/13 > non-acute  CULTURES: Blood 2/13 > Urine 2/13 >  ANTIBIOTICS: Zosyn 2/13 > Vancomycin 2/13 in ED Azithromycin 2/13 > Flu 2/13 > Cdif 2/13 > Neg  SIGNIFICANT  EVENTS: 2/13 > admit  LINES/TUBES:   ASSESSMENT / PLAN:  Sepsis secondary to urinary tract infection - Telemetry monitoring - ABX as above (Zosyn) - Follow blood, urine cultures - Flu pending  - Trend PCT - Maintenance IVF  Acute metabolic encephalopathy in setting of sepsis - Monitor - NPO - At risk for intubation if mentation decreases again, however, seems as though she is improving significantly since presentation.  DM - CBG monitoring and SSI  Anemia (Hgb 11, unknown baseline) -monitor, follow CBC  H/o HTN, HLD - Holding home asa, simvastatin, verapamil while unable to take [place], AGACNP-BC [facility] Pulmonology/Critical Care Pager [phone] or [phone]  08/31/2016 9:57 PM

## 2016-08-31 NOTE — Progress Notes (Signed)
Pharmacy Antibiotic Note  Toni [name] is a 83 y.o. female admitted on 08/31/2016 with sepsis. Pt reports new onset tremors, AMS, and has elevated lactate/WBC. Pharmacy has been consulted for vancomycin and Zosyn dosing.   Plan: -Zosyn 3.375g IV x1 over 30 min per MD -Vancomycin 1g IV x1 per MD -Zosyn 3.375g IV q8h EI -Vancomycin 1250mg  IV q12h -Monitor renal function, LOT, cultures, vancomycin level as indicated      Temp (24hrs), Avg:103.3 F (39.6 C), Min:103.3 F (39.6 C), Max:103.3 F (39.6 C)  No results for input(s): WBC, CREATININE, LATICACIDVEN, VANCOTROUGH, VANCOPEAK, VANCORANDOM, GENTTROUGH, GENTPEAK, GENTRANDOM, TOBRATROUGH, TOBRAPEAK, TOBRARND, AMIKACINPEAK, AMIKACINTROU, AMIKACIN in the last 168 hours.  CrCl cannot be calculated (No order found.).    No Known Allergies  Antimicrobials this admission: 2/13 Zosyn >>  2/13 Vancomycin >>   Dose adjustments this admission: none  Microbiology results: 2/13 BCx: IP 2/13 UCx: IP   Thank you for allowing pharmacy to be a part of this patient's care.  Arrie Senate, PharmD PGY-1 Pharmacy Resident Pager: [phone] 08/31/2016

## 2016-09-01 DIAGNOSIS — I959 Hypotension, unspecified: Secondary | ICD-10-CM

## 2016-09-01 DIAGNOSIS — R4182 Altered mental status, unspecified: Secondary | ICD-10-CM

## 2016-09-01 DIAGNOSIS — A419 Sepsis, unspecified organism: Secondary | ICD-10-CM

## 2016-09-01 DIAGNOSIS — G9340 Encephalopathy, unspecified: Secondary | ICD-10-CM

## 2016-09-01 LAB — GLUCOSE, CAPILLARY
GLUCOSE-CAPILLARY: 182 mg/dL — AB (ref 65–99)
Glucose-Capillary: 178 mg/dL — ABNORMAL HIGH (ref 65–99)
Glucose-Capillary: 204 mg/dL — ABNORMAL HIGH (ref 65–99)
Glucose-Capillary: 206 mg/dL — ABNORMAL HIGH (ref 65–99)
Glucose-Capillary: 352 mg/dL — ABNORMAL HIGH (ref 65–99)
Glucose-Capillary: 90 mg/dL (ref 65–99)
Glucose-Capillary: 95 mg/dL (ref 65–99)

## 2016-09-01 LAB — BLOOD CULTURE ID PANEL (REFLEXED)
ACINETOBACTER BAUMANNII: NOT DETECTED
CANDIDA ALBICANS: NOT DETECTED
CANDIDA GLABRATA: NOT DETECTED
CANDIDA TROPICALIS: NOT DETECTED
Candida krusei: NOT DETECTED
Candida parapsilosis: NOT DETECTED
ENTEROBACTER CLOACAE COMPLEX: NOT DETECTED
ENTEROBACTERIACEAE SPECIES: NOT DETECTED
Enterococcus species: NOT DETECTED
Escherichia coli: NOT DETECTED
Haemophilus influenzae: NOT DETECTED
KLEBSIELLA PNEUMONIAE: NOT DETECTED
Klebsiella oxytoca: NOT DETECTED
Listeria monocytogenes: NOT DETECTED
NEISSERIA MENINGITIDIS: NOT DETECTED
PSEUDOMONAS AERUGINOSA: NOT DETECTED
Proteus species: NOT DETECTED
STREPTOCOCCUS AGALACTIAE: NOT DETECTED
STREPTOCOCCUS PNEUMONIAE: NOT DETECTED
Serratia marcescens: NOT DETECTED
Staphylococcus aureus (BCID): NOT DETECTED
Staphylococcus species: NOT DETECTED
Streptococcus pyogenes: NOT DETECTED
Streptococcus species: DETECTED — AB

## 2016-09-01 LAB — CREATININE, SERUM: CREATININE: 0.58 mg/dL (ref 0.44–1.00)

## 2016-09-01 LAB — CBC
HCT: 26.4 % — ABNORMAL LOW (ref 36.0–46.0)
Hemoglobin: 8.1 g/dL — ABNORMAL LOW (ref 12.0–15.0)
MCH: 27.6 pg (ref 26.0–34.0)
MCHC: 30.7 g/dL (ref 30.0–36.0)
MCV: 90.1 fL (ref 78.0–100.0)
PLATELETS: 275 10*3/uL (ref 150–400)
RBC: 2.93 MIL/uL — AB (ref 3.87–5.11)
RDW: 14.9 % (ref 11.5–15.5)
WBC: 39.1 10*3/uL — ABNORMAL HIGH (ref 4.0–10.5)

## 2016-09-01 LAB — RESPIRATORY PANEL BY PCR
ADENOVIRUS-RVPPCR: NOT DETECTED
Bordetella pertussis: NOT DETECTED
CHLAMYDOPHILA PNEUMONIAE-RVPPCR: NOT DETECTED
CORONAVIRUS NL63-RVPPCR: NOT DETECTED
Coronavirus 229E: NOT DETECTED
Coronavirus HKU1: NOT DETECTED
Coronavirus OC43: NOT DETECTED
INFLUENZA A-RVPPCR: NOT DETECTED
Influenza B: NOT DETECTED
METAPNEUMOVIRUS-RVPPCR: NOT DETECTED
Mycoplasma pneumoniae: NOT DETECTED
PARAINFLUENZA VIRUS 3-RVPPCR: NOT DETECTED
PARAINFLUENZA VIRUS 4-RVPPCR: NOT DETECTED
Parainfluenza Virus 1: NOT DETECTED
Parainfluenza Virus 2: NOT DETECTED
RHINOVIRUS / ENTEROVIRUS - RVPPCR: NOT DETECTED
Respiratory Syncytial Virus: NOT DETECTED

## 2016-09-01 LAB — LACTIC ACID, PLASMA: LACTIC ACID, VENOUS: 2.1 mmol/L — AB (ref 0.5–1.9)

## 2016-09-01 LAB — MRSA PCR SCREENING: MRSA BY PCR: NEGATIVE

## 2016-09-01 LAB — STREP PNEUMONIAE URINARY ANTIGEN: STREP PNEUMO URINARY ANTIGEN: NEGATIVE

## 2016-09-01 LAB — HIV ANTIBODY (ROUTINE TESTING W REFLEX): HIV SCREEN 4TH GENERATION: NONREACTIVE

## 2016-09-01 LAB — PROCALCITONIN: Procalcitonin: 68.82 ng/mL

## 2016-09-01 MED ORDER — OSELTAMIVIR PHOSPHATE 75 MG PO CAPS
75.0000 mg | ORAL_CAPSULE | Freq: Two times a day (BID) | ORAL | Status: DC
  Filled 2016-09-01: qty 1

## 2016-09-01 MED ORDER — CHLORHEXIDINE GLUCONATE 0.12 % MT SOLN
15.0000 mL | Freq: Two times a day (BID) | OROMUCOSAL | Status: DC
  Administered 2016-09-01 – 2016-09-03 (×5): 15 mL via OROMUCOSAL
  Filled 2016-09-01 (×4): qty 15

## 2016-09-01 MED ORDER — OSELTAMIVIR PHOSPHATE 30 MG PO CAPS
30.0000 mg | ORAL_CAPSULE | Freq: Two times a day (BID) | ORAL | Status: DC
  Administered 2016-09-01 – 2016-09-02 (×2): 30 mg via ORAL
  Filled 2016-09-01 (×2): qty 1

## 2016-09-01 MED ORDER — ENOXAPARIN SODIUM 40 MG/0.4ML ~~LOC~~ SOLN
40.0000 mg | SUBCUTANEOUS | Status: DC
  Administered 2016-09-01 – 2016-09-05 (×5): 40 mg via SUBCUTANEOUS
  Filled 2016-09-01 (×5): qty 0.4

## 2016-09-01 MED ORDER — ORAL CARE MOUTH RINSE
15.0000 mL | Freq: Two times a day (BID) | OROMUCOSAL | Status: DC
  Administered 2016-09-01 – 2016-09-03 (×4): 15 mL via OROMUCOSAL

## 2016-09-01 MED ORDER — SODIUM CHLORIDE 0.45 % IV SOLN
INTRAVENOUS | Status: DC
  Administered 2016-09-01 – 2016-09-07 (×8): via INTRAVENOUS

## 2016-09-01 MED ORDER — VANCOMYCIN HCL IN DEXTROSE 750-5 MG/150ML-% IV SOLN
750.0000 mg | Freq: Two times a day (BID) | INTRAVENOUS | Status: DC
  Administered 2016-09-01 – 2016-09-02 (×2): 750 mg via INTRAVENOUS
  Filled 2016-09-01 (×3): qty 150

## 2016-09-01 NOTE — Progress Notes (Signed)
PULMONARY / CRITICAL CARE MEDICINE   Name: Toni [name] MRN: [id number] DOB: [birth year]    ADMISSION DATE:  08/31/2016 CONSULTATION DATE:  2/13  REFERRING MD:  Dr. Tomi Bamberger EDP  CHIEF COMPLAINT:  AMS  HISTORY OF PRESENT ILLNESS:   83 year old female with PMH as below, which is significant for DM, HTN, and HLD. At baseline she is essentially bed/chair ridden due to knee pain s/p several replacement surgeries. She is able to ambulate very short distance with walker and requires assistance with almost all ADLs, but is apparently mentally intact and able to converse with family and make phone calls to friends. Her grandchildren live with her. She was in her usual state of health when her daughter left the night of 2/12, and since that time her mental status worsened. Initially she would only be able to mumble, and then ultimately became unresponsive and tremulous. She was brought to ED for further evaluation where exam was notable for fever, leukocytosis, and tachycardia. Laboratory evaluation significant for Lactic acid 6.29 and urine with many bacteria and nitrite positive.  CXR also concerning for PNA. She was started on empiric ABX and due to poor mental status PCCM asked to see.   SUBJECTIVE:  No events overnight, on RA  VITAL SIGNS: BP (!) 111/54 (BP Location: Right Arm)   Pulse 64   Temp 97.5 F (36.4 C) (Oral)   Resp (!) 26   Ht 5\' 2"  (1.575 m)   Wt 82.6 kg (182 lb 1.6 oz)   SpO2 98%   BMI 33.31 kg/m   HEMODYNAMICS:    VENTILATOR SETTINGS:    INTAKE / OUTPUT: I/O last 3 completed shifts: In: 4203.8 [I.V.:403.8; IV Piggyback:3800] Out: -   PHYSICAL EXAMINATION: General:  Frail elderly female, NAD Neuro:  Awake and interactive, moving all ext to command HEENT:  [place]/AT, PERRL, EOM-I and MMM Cardiovascular:  RRR, Nl S1/S2, -M/R/G. Lungs:  CTA bilaterally Abdomen:  Soft, NT, ND and +BS Musculoskeletal:  No acute deformity Skin:  Remote surgical scars to bilateral knees.    LABS:  BMET  Recent Labs Lab 08/31/16 1648 09/01/16 0209  NA 140  --   K 4.1  --   CL 103  --   CO2 26  --   BUN 12  --   CREATININE 0.69 0.58  GLUCOSE 336*  --     Electrolytes  Recent Labs Lab 08/31/16 1648  CALCIUM 9.1    CBC  Recent Labs Lab 08/31/16 1648 09/01/16 0209  WBC 12.2* 39.1*  HGB 11.2* 8.1*  HCT 36.4 26.4*  PLT 336 275    Coag's  Recent Labs Lab 08/31/16 1648  APTT 26  INR 1.33    Sepsis Markers  Recent Labs Lab 08/31/16 1708 08/31/16 1944 08/31/16 2341  LATICACIDVEN 6.29* 4.15* 2.1*  PROCALCITON  --   --  68.82    ABG  Recent Labs Lab 08/31/16 2036  PHART 7.401  PCO2ART 42.9  PO2ART 167.0*    Liver Enzymes  Recent Labs Lab 08/31/16 1648  AST 32  ALT 23  ALKPHOS 168*  BILITOT 1.6*  ALBUMIN 1.8*    Cardiac Enzymes No results for input(s): TROPONINI, PROBNP in the last 168 hours.  Glucose  Recent Labs Lab 08/31/16 1623 09/01/16 0111 09/01/16 0435 09/01/16 0755 09/01/16 1225  GLUCAP 333* 352* 206* 95 90    Imaging Ct Head Wo Contrast  Result Date: 08/31/2016 CLINICAL DATA:  Altered mental status EXAM: CT HEAD WITHOUT CONTRAST TECHNIQUE:  Contiguous axial images were obtained from the base of the skull through the vertex without intravenous contrast. COMPARISON:  None. FINDINGS: Brain: No mass lesion, intraparenchymal hemorrhage or extra-axial collection. No evidence of acute cortical infarct. There is periventricular hypoattenuation compatible with chronic microvascular disease. There is bilateral basal ganglia mineralization. Vascular: No hyperdense vessel or unexpected calcification. Skull: Normal visualized skull base, calvarium and extracranial soft tissues. Sinuses/Orbits: No sinus fluid levels or advanced mucosal thickening. No mastoid effusion. Normal orbits. IMPRESSION: Chronic microvascular ischemia without acute intracranial abnormality. Electronically Signed   By: Ulyses Jarred M.D.   On:  08/31/2016 18:27   Dg Chest Portable 1 View  Result Date: 08/31/2016 CLINICAL DATA:  New onset of tremors. Altered mental status. Initial encounter. EXAM: PORTABLE CHEST 1 VIEW COMPARISON:  Chest radiograph performed 03/31/2011 FINDINGS: The lungs are hypoexpanded. Mild patchy left central airspace opacities may reflect pneumonia, depending on the patient's symptoms. There is no evidence of pleural effusion or pneumothorax. The cardiomediastinal silhouette is borderline normal in size. No acute osseous abnormalities are seen. IMPRESSION: Lungs hypoexpanded. Mild patchy left central airspace opacities may reflect mild pneumonia, depending on the patient's symptoms. Electronically Signed   By: Garald Balding M.D.   On: 08/31/2016 17:14     STUDIES:  CT head 2/13 > non-acute  CULTURES: Blood 2/13 > Urine 2/13 >  ANTIBIOTICS: Zosyn 2/13 > Vancomycin 2/13 in ED Azithromycin 2/13 > Flu 2/13 > Cdif 2/13 > Neg  SIGNIFICANT EVENTS: 2/13 > admit  LINES/TUBES:  I reviewed CXR myself, central infiltrate noted.  ASSESSMENT / PLAN:  Sepsis secondary to urinary tract infection - Continue tele monitoring - Zosyn - F/U oncultures - Flu pending - PCT noted - IVF as ordered  Acute metabolic encephalopathy in setting of sepsis - Monitor clinically - Diet as tolerated - Monitor for airway protection  DM - CBG monitoring and SSI  Anemia (Hgb 11, unknown baseline) - CBC in AM  H/o HTN, HLD - Holding home asa, simvastatin, verapamil while unable to take PO  Discussed with PCCM-NP  PCCM will sign off, please call back if needed  Patient seen and examined, agree with above note.  I dictated the care and orders written for this patient under my direction.  Rush Farmer, MD [phone]  09/01/2016 2:32 PM

## 2016-09-01 NOTE — Progress Notes (Addendum)
Patient ID: Toni [name], female   DOB: [birth year], 83 y.o.   MRN: [id number]  PROGRESS NOTE    Toni [name]  [id number] DOB: [birth year] DOA: 08/31/2016  PCP: Elyn Peers, MD   Brief Narrative:  83 y.o. female with history of HTN who presented with altered mental status for past few days prior to this admission. Per family report, patient apparently had a cough 2 weeks ago, got a flu shot one week ago and that after that started to get very weak and confused. In ED, blood pressure was as low as 76/43. Tmax 103.35F, HR 57-106, RR 22-46. Blood work was notable for glucose of 336, lactic acid 6.29, white blood cell count 12.2 and then repeat count 39.1. Ethanol was within normal limits. Urinalysis showed positive nitrites and many bacteria. Urine and blood cultures were obtained, respiratory virus panel is pending. Chest x-ray showed left central airspace opacity. Patient was started on empiric vancomycin and Zosyn for sepsis thought to be due to UTI and possibly pneumonia.  Assessment & Plan:   Principal Problem: Sepsis secondary to community acquired pneumonia / UTI / possible influenza pneumonia / leukocytosis - Sepsis criteria met on the admission with fever, hypotension, tachycardia, tachypnea, lactic acidosis, leukocytosis - Multiple sources of infection are possible including pneumonia, UTI and possibly influenza - She is on vancomycin and Zosyn but we will start Tamiflu empirically until respiratory virus panel comes back - Continue empiric azithromycin - Follow up urine and blood culture results - Continue to monitor in step down unit   Active Problems: Acute metabolic encephalopathy - Likely related to sepsis and multiple sources of infection - Continue to monitor her mental status as she still lethargic this morning  Hyperglycemia - Monitor CBGs - Continue sliding scale insulin - She is not on anti-hyperglycemics at home  Normocytic anemia  - Hemoglobin 8.1, down  from 11.2 on the admission - Likely from IV fluids, dilution - Continue to monitor CBC daily   DVT prophylaxis:  Lovenox subQ  Code Status: full code  Family Communication: No family at the bedside Disposition Plan: Patient is septic, not yet stable for discharge.    Consultants:   Palliative care  Procedures:   None  Antimicrobials:   Vancomycin and Zosyn 08/31/2016 -->  Azithromycin 08/31/2016 -->   Subjective: No overnight events.   Objective: Vitals:   09/01/16 0700 09/01/16 0729 09/01/16 0732 09/01/16 0800  BP: (!) 86/54 (!) 76/43 (!) 96/59 92/67  Pulse: 69 73 68 70  Resp: (!) 26 (!) 31 (!) 28 (!) 26  Temp:   (!) 96.6 F (35.9 C)   TempSrc:   Axillary   SpO2: 100% 100% 100% 100%  Weight:      Height:        Intake/Output Summary (Last 24 hours) at 09/01/16 0935 Last data filed at 09/01/16 0900  Gross per 24 hour  Intake          4353.75 ml  Output                0 ml  Net          4353.75 ml   Filed Weights   08/31/16 1716 09/01/16 0050 09/01/16 0441  Weight: 99.8 kg (220 lb) 82.9 kg (182 lb 12.2 oz) 82.6 kg (182 lb 1.6 oz)    Examination:  General exam: Appears calm and comfortable  Respiratory system: Diminished breath sounds, no wheezing  Cardiovascular system: S1 & S2 heard, Rate controlled. No  pedal edema. Gastrointestinal system: Abdomen is nondistended, soft and nontender. No organomegaly or masses felt. Normal bowel sounds heard. Central nervous system: Lethargic, opens eyes briefly when called her name Extremities: Symmetric 5 x 5 power. Skin: No rashes, lesions or ulcers Psychiatry: Unable to assess due to altered mental status   Data Reviewed: I have personally reviewed following labs and imaging studies  CBC:  Recent Labs Lab 08/31/16 1648 09/01/16 0209  WBC 12.2* 39.1*  NEUTROABS 11.1*  --   HGB 11.2* 8.1*  HCT 36.4 26.4*  MCV 90.5 90.1  PLT 336 381   Basic Metabolic Panel:  Recent Labs Lab 08/31/16 1648  09/01/16 0209  NA 140  --   K 4.1  --   CL 103  --   CO2 26  --   GLUCOSE 336*  --   BUN 12  --   CREATININE 0.69 0.58  CALCIUM 9.1  --    GFR: Estimated Creatinine Clearance: 54.9 mL/min (by C-G formula based on SCr of 0.58 mg/dL). Liver Function Tests:  Recent Labs Lab 08/31/16 1648  AST 32  ALT 23  ALKPHOS 168*  BILITOT 1.6*  PROT 7.5  ALBUMIN 1.8*   No results for input(s): LIPASE, AMYLASE in the last 168 hours.  Recent Labs Lab 08/31/16 1648  AMMONIA 76*   Coagulation Profile:  Recent Labs Lab 08/31/16 1648  INR 1.33   Cardiac Enzymes: No results for input(s): CKTOTAL, CKMB, CKMBINDEX, TROPONINI in the last 168 hours. BNP (last 3 results) No results for input(s): PROBNP in the last 8760 hours. HbA1C: No results for input(s): HGBA1C in the last 72 hours. CBG:  Recent Labs Lab 08/31/16 1623 09/01/16 0111 09/01/16 0435 09/01/16 0755  GLUCAP 333* 352* 206* 95   Lipid Profile: No results for input(s): CHOL, HDL, LDLCALC, TRIG, CHOLHDL, LDLDIRECT in the last 72 hours. Thyroid Function Tests: No results for input(s): TSH, T4TOTAL, FREET4, T3FREE, THYROIDAB in the last 72 hours. Anemia Panel: No results for input(s): VITAMINB12, FOLATE, FERRITIN, TIBC, IRON, RETICCTPCT in the last 72 hours. Urine analysis:    Component Value Date/Time   COLORURINE AMBER (A) 08/31/2016 1647   APPEARANCEUR HAZY (A) 08/31/2016 1647   LABSPEC 1.022 08/31/2016 1647   PHURINE 5.0 08/31/2016 1647   GLUCOSEU >=500 (A) 08/31/2016 1647   HGBUR MODERATE (A) 08/31/2016 1647   BILIRUBINUR NEGATIVE 08/31/2016 1647   KETONESUR NEGATIVE 08/31/2016 1647   PROTEINUR NEGATIVE 08/31/2016 1647   NITRITE POSITIVE (A) 08/31/2016 1647   LEUKOCYTESUR NEGATIVE 08/31/2016 1647   Sepsis Labs: _0 (procalcitonin:4,lacticidven:4)    C difficile quick scan w PCR reflex     Status: None   Collection Time: 08/31/16  4:53 PM  Result Value Ref Range Status   C Diff antigen NEGATIVE  NEGATIVE Final   C Diff toxin NEGATIVE NEGATIVE Final   C Diff interpretation No C. difficile detected.  Final  MRSA PCR Screening     Status: None   Collection Time: 09/01/16  1:01 AM  Result Value Ref Range Status   MRSA by PCR NEGATIVE NEGATIVE Final      Radiology Studies: Ct Head Wo Contrast Result Date: 08/31/2016 Chronic microvascular ischemia without acute intracranial abnormality. Electronically Signed   By: Ulyses Jarred M.D.   On: 08/31/2016 18:27   Dg Chest Portable 1 View Result Date: 08/31/2016 Lungs hypoexpanded. Mild patchy left central airspace opacities may reflect mild pneumonia, depending on the patient's symptoms. Electronically Signed   By: Francoise Schaumann.D.  On: 08/31/2016 17:14      Scheduled Meds: . azithromycin  500 mg Intravenous Q24H  . chlorhexidine  15 mL Mouth Rinse BID  . enoxaparin (LOVENOX) injection  40 mg Subcutaneous Q24H  . insulin aspart  0-15 Units Subcutaneous Q4H  . mouth rinse  15 mL Mouth Rinse q12n4p  . piperacillin-tazobactam (ZOSYN)  IV  3.375 g Intravenous Q8H  . vancomycin  1,250 mg Intravenous Q12H   Continuous Infusions: . sodium chloride 75 mL/hr at 09/01/16 0137     LOS: 1 day    Time spent: 25 minutes  Greater than 50% of the time spent on counseling and coordinating the care.   Leisa Lenz, MD [facility] Pager [phone]  If 7PM-7AM, please contact night-coverage [email or web address] Password [facility] 09/01/2016, 9:35 AM

## 2016-09-01 NOTE — Progress Notes (Signed)
PHARMACY - PHYSICIAN COMMUNICATION CRITICAL VALUE ALERT - BLOOD CULTURE IDENTIFICATION (BCID)  Results for orders placed or performed during the hospital encounter of 08/31/16  Blood Culture ID Panel (Reflexed) (Collected: 08/31/2016  4:45 PM)  Result Value Ref Range   Enterococcus species NOT DETECTED NOT DETECTED   Listeria monocytogenes NOT DETECTED NOT DETECTED   Staphylococcus species NOT DETECTED NOT DETECTED   Staphylococcus aureus NOT DETECTED NOT DETECTED   Streptococcus species DETECTED (A) NOT DETECTED   Streptococcus agalactiae NOT DETECTED NOT DETECTED   Streptococcus pneumoniae NOT DETECTED NOT DETECTED   Streptococcus pyogenes NOT DETECTED NOT DETECTED   Acinetobacter baumannii NOT DETECTED NOT DETECTED   Enterobacteriaceae species NOT DETECTED NOT DETECTED   Enterobacter cloacae complex NOT DETECTED NOT DETECTED   Escherichia coli NOT DETECTED NOT DETECTED   Klebsiella oxytoca NOT DETECTED NOT DETECTED   Klebsiella pneumoniae NOT DETECTED NOT DETECTED   Proteus species NOT DETECTED NOT DETECTED   Serratia marcescens NOT DETECTED NOT DETECTED   Haemophilus influenzae NOT DETECTED NOT DETECTED   Neisseria meningitidis NOT DETECTED NOT DETECTED   Pseudomonas aeruginosa NOT DETECTED NOT DETECTED   Candida albicans NOT DETECTED NOT DETECTED   Candida glabrata NOT DETECTED NOT DETECTED   Candida krusei NOT DETECTED NOT DETECTED   Candida parapsilosis NOT DETECTED NOT DETECTED   Candida tropicalis NOT DETECTED NOT DETECTED    Name of physician (or Provider) Contacted: Dr. Charlies Silvers  Changes to prescribed antibiotics required: Could streamline to Rocephin 2 grams iv Q 24 hours  Tad Moore 09/01/2016  4:31 PM

## 2016-09-01 NOTE — Care Management Note (Addendum)
Case Management Note  Patient Details  Name: Toni [name] MRN: [id number] Date of Birth: 1933-03-23  Subjective/Objective:   Presents from home with daughter and grandchildren, she is bed/chair ridden, can ambulate short distances with walker and requires assistance with ADL's. She is with sepsis, encephalopathy, DM, anemia, htn.  NCM will cont to follow for dc needs.   2/15 [place], BSN - palliative spoke with patient's daughter on the phone today, the plan is to meet on 2/16 and discuss code status and plan of care for patient.   Await pt eval.              Action/Plan:   Expected Discharge Date:                  Expected Discharge Plan:     In-House Referral:     Discharge planning Services  CM Consult  Post Acute Care Choice:    Choice offered to:     DME Arranged:    DME Agency:     HH Arranged:    HH Agency:     Status of Service:  In process, will continue to follow  If discussed at Long Length of Stay Meetings, dates discussed:    Additional Comments:  Zenon Mayo, RN 09/01/2016, 4:45 PM

## 2016-09-01 NOTE — Progress Notes (Signed)
Inpatient Diabetes Program Recommendations  AACE/ADA: New Consensus Statement on Inpatient Glycemic Control (2015)  Target Ranges:  Prepandial:   less than 140 mg/dL      Peak postprandial:   less than 180 mg/dL (1-2 hours)      Critically ill patients:  140 - 180 mg/dL   Lab Results  Component Value Date   GLUCAP 90 09/01/2016    Review of Glycemic Control:  Results for MARCHIA, ROBER (MRN [id number]) as of 09/01/2016 15:10  Ref. Range 08/31/2016 16:23 09/01/2016 01:11 09/01/2016 04:35 09/01/2016 07:55 09/01/2016 12:25  Glucose-Capillary Latest Ref Range: 65 - 99 mg/dL 333 (H) 352 (H) 206 (H) 95 90   Diabetes history: None Current orders for Inpatient glycemic control: Novolog moderate q 4 hours  Inpatient Diabetes Program Recommendations:    Note that PO diet started today.  Please consider changing Novolog correction to tid with meals and HS.  A1C would likely not be accurate at this point due to low HgB.  Will follow.  Thanks, Adah Perl, RN, BC-ADM Inpatient Diabetes Coordinator Pager [phone] (8a-5p)

## 2016-09-01 NOTE — Evaluation (Signed)
Clinical/Bedside Swallow Evaluation Patient Details  Name: Toni [name] MRN: [id number] Date of Birth: August 14, 1932  Today's Date: 09/01/2016 Time: SLP Start Time (ACUTE ONLY): 0830 SLP Stop Time (ACUTE ONLY): 0839 SLP Time Calculation (min) (ACUTE ONLY): 9 min  Past Medical History:  Past Medical History:  Diagnosis Date  . Diabetes mellitus without complication ([place])   . Peripheral vascular disease ([place])   . Plantar fascial fibromatosis    Past Surgical History:  Past Surgical History:  Procedure Laterality Date  . ABDOMINAL HYSTERECTOMY     partial  . ECTOPIC PREGNANCY SURGERY    . JOINT REPLACEMENT Bilateral    TKR   HPI:  Toni [name] is a 83 y.o. female with history of HTN presenting to ED with altered MS for about 2-3 days.  Per family she had a cough 2 weeks ago, then got a flu shot last week.  The last few days has gotten very weak and now is confused.  In ED patient was confused and poorly responsive on arrival.  CXR showed RLL changes, temp was 103 rectal.    Assessment / Plan / Recommendation Clinical Impression  Pt demonstrates normal swallow function despite mild lethargy during assessment. No signs of aspiration with 3 oz water test, able to masticate regular solids. Pt may resume a regular diet and thin liquids. No SLP f/u needed. will sign off.     Aspiration Risk       Diet Recommendation Regular;Thin liquid   Liquid Administration via: Cup;Straw Medication Administration: Whole meds with liquid Supervision: Patient able to self feed Compensations: Slow rate;Small sips/bites Postural Changes: Seated upright at 90 degrees    Other  Recommendations Oral Care Recommendations: Oral care BID   Follow up Recommendations None      Frequency and Duration            Prognosis        Swallow Study   General HPI: Toni [name] is a 83 y.o. female with history of HTN presenting to ED with altered MS for about 2-3 days.  Per family she had a cough 2  weeks ago, then got a flu shot last week.  The last few days has gotten very weak and now is confused.  In ED patient was confused and poorly responsive on arrival.  CXR showed RLL changes, temp was 103 rectal.  Type of Study: Bedside Swallow Evaluation Previous Swallow Assessment: none Diet Prior to this Study: NPO Temperature Spikes Noted: No Respiratory Status: Room air History of Recent Intubation: No Behavior/Cognition: Alert;Cooperative;Requires cueing Oral Cavity Assessment: Dry Oral Care Completed by SLP: No Oral Cavity - Dentition: Adequate natural dentition Vision: Functional for self-feeding Self-Feeding Abilities: Able to feed self;Needs assist Patient Positioning: Upright in bed Baseline Vocal Quality: Normal Volitional Cough: Strong Volitional Swallow: Able to elicit    Oral/Motor/Sensory Function     Ice Chips     Thin Liquid Thin Liquid: Within functional limits Presentation: Cup;Straw;Self Fed    Nectar Thick Nectar Thick Liquid: Not tested   Honey Thick Honey Thick Liquid: Not tested   Puree Puree: Within functional limits Presentation: Spoon   Solid   GO   Solid: Within functional limits       [facility], MA CCC-SLP [id number]  Toni [name] 09/01/2016,8:39 AM

## 2016-09-01 NOTE — Progress Notes (Signed)
Pharmacy Antibiotic Note  Toni [name] is a 83 y.o. female admitted on 08/31/2016 with sepsis due to CAP vs UTI vs flu.  Pharmacy has been consulted for Vancomycin and Zosyn dosing.  Noted documented change in weight since estimated in ED (220lb vs 182 lb actual).  Will adjust Vancomycin dosing.  Tm 103.3, WBC 39.1, hypotensive 76-96/40-50s.  Plan: -Continue Zosyn 3.375g IV q8h -Change Vancomycin 750mg  IV q12h -Azithro 500mg  IV q24h (per MD) -Change Tamiflu to 30mg  PO q12h (adjsuted for CrCl <60) -Monitor renal function, LOT, cultures, vancomycin level as indicated  Height: 5\' 2"  (157.5 cm) Weight: 182 lb 1.6 oz (82.6 kg) IBW/kg (Calculated) : 50.1  Temp (24hrs), Avg:99.3 F (37.4 C), Min:96.6 F (35.9 C), Max:103.3 F (39.6 C)   Recent Labs Lab 08/31/16 1648 08/31/16 1708 08/31/16 1944 08/31/16 2341 09/01/16 0209  WBC 12.2*  --   --   --  39.1*  CREATININE 0.69  --   --   --  0.58  LATICACIDVEN  --  6.29* 4.15* 2.1*  --     Estimated Creatinine Clearance: 54.9 mL/min (by C-G formula based on SCr of 0.58 mg/dL).    No Known Allergies  Antimicrobials this admission: 2/13 Vancomycin >> 2/13 Zosyn >> 2/13 Azithro >> 2/14 Tamiflu >>  Dose adjustments this admission:   Microbiology results: 2/13 UCx: IP 2/13 BCx: IP 2/14 MRSA PCR negative 2/13: Resp panel: IP 2/13: Cdiff negative   Thank you for allowing pharmacy to be a part of this patient's care.  [employer], Pharm.D., BCPS Clinical Pharmacist Pager [phone] 09/01/2016 10:43 AM

## 2016-09-02 DIAGNOSIS — D72829 Elevated white blood cell count, unspecified: Secondary | ICD-10-CM | POA: Diagnosis present

## 2016-09-02 DIAGNOSIS — G9341 Metabolic encephalopathy: Secondary | ICD-10-CM | POA: Diagnosis present

## 2016-09-02 DIAGNOSIS — A409 Streptococcal sepsis, unspecified: Principal | ICD-10-CM | POA: Diagnosis present

## 2016-09-02 DIAGNOSIS — N390 Urinary tract infection, site not specified: Secondary | ICD-10-CM

## 2016-09-02 DIAGNOSIS — B9620 Unspecified Escherichia coli [E. coli] as the cause of diseases classified elsewhere: Secondary | ICD-10-CM | POA: Diagnosis present

## 2016-09-02 DIAGNOSIS — B955 Unspecified streptococcus as the cause of diseases classified elsewhere: Secondary | ICD-10-CM | POA: Diagnosis present

## 2016-09-02 DIAGNOSIS — R7881 Bacteremia: Secondary | ICD-10-CM | POA: Diagnosis present

## 2016-09-02 LAB — GLUCOSE, CAPILLARY
GLUCOSE-CAPILLARY: 155 mg/dL — AB (ref 65–99)
Glucose-Capillary: 180 mg/dL — ABNORMAL HIGH (ref 65–99)
Glucose-Capillary: 204 mg/dL — ABNORMAL HIGH (ref 65–99)
Glucose-Capillary: 210 mg/dL — ABNORMAL HIGH (ref 65–99)
Glucose-Capillary: 223 mg/dL — ABNORMAL HIGH (ref 65–99)

## 2016-09-02 LAB — BASIC METABOLIC PANEL
ANION GAP: 8 (ref 5–15)
BUN: 14 mg/dL (ref 6–20)
CHLORIDE: 105 mmol/L (ref 101–111)
CO2: 25 mmol/L (ref 22–32)
Calcium: 8 mg/dL — ABNORMAL LOW (ref 8.9–10.3)
Creatinine, Ser: 0.5 mg/dL (ref 0.44–1.00)
GFR calc non Af Amer: >60 mL/min (ref >60)
GLUCOSE: 185 mg/dL — AB (ref 65–99)
POTASSIUM: 3.8 mmol/L (ref 3.5–5.1)
Sodium: 138 mmol/L (ref 135–145)

## 2016-09-02 LAB — CBC
HEMATOCRIT: 28.6 % — AB (ref 36.0–46.0)
HEMOGLOBIN: 8.8 g/dL — AB (ref 12.0–15.0)
MCH: 27.6 pg (ref 26.0–34.0)
MCHC: 30.8 g/dL (ref 30.0–36.0)
MCV: 89.7 fL (ref 78.0–100.0)
Platelets: 267 10*3/uL (ref 150–400)
RBC: 3.19 MIL/uL — AB (ref 3.87–5.11)
RDW: 15 % (ref 11.5–15.5)
WBC: 31.8 10*3/uL — AB (ref 4.0–10.5)

## 2016-09-02 LAB — URINE CULTURE

## 2016-09-02 MED ORDER — ACETAMINOPHEN 325 MG PO TABS
650.0000 mg | ORAL_TABLET | Freq: Four times a day (QID) | ORAL | Status: DC | PRN
  Administered 2016-09-02 – 2016-09-09 (×10): 650 mg via ORAL
  Filled 2016-09-02 (×10): qty 2

## 2016-09-02 MED ORDER — DEXTROSE 5 % IV SOLN
2.0000 g | INTRAVENOUS | Status: DC
  Administered 2016-09-02 – 2016-09-09 (×8): 2 g via INTRAVENOUS
  Filled 2016-09-02 (×9): qty 2

## 2016-09-02 NOTE — Progress Notes (Addendum)
Patient ID: Toni [name], female   DOB: [birth year], 83 y.o.   MRN: [id number]  PROGRESS NOTE    Toni [name]  [name] DOB: October 11, 1932 DOA: 08/31/2016  PCP: Elyn Peers, MD   Brief Narrative:  83 y.o. female with history of HTN who presented with altered mental status for past few days prior to this admission. Per family report, patient apparently had a cough 2 weeks ago, got a flu shot one week ago and that after that started to get very weak and confused. In ED, blood pressure was as low as 76/43. Tmax 103.74F, HR 57-106, RR 22-46. Blood work was notable for glucose of 336, lactic acid 6.29, white blood cell count 12.2 and then repeat count 39.1. Ethanol was within normal limits. Urinalysis showed positive nitrites and many bacteria. Urine and blood cultures were obtained, respiratory virus panel is pending. Chest x-ray showed left central airspace opacity. Patient was started on empiric vancomycin and Zosyn for sepsis thought to be due to UTI and possibly pneumonia. Further work up revealed strep bacteremia.   Assessment & Plan:   Principal Problem: Sepsis secondary to community acquired pneumonia and streptococcus bacteremia and E.Coli UTI / leukocytosis - Sepsis criteria met on the admission with fever, hypotension, tachycardia, tachypnea, lactic acidosis, leukocytosis. Procalcitonin elevated at 68.82 - Multiple sources of infection: strep bacteremia, E.Coli UTI (blood cx growing strep species, sens report pending. UCx growing E.Coli) - Influenza negative so we stopped tamiflu today 2/15 - Stop zosyn, azithro and vanco and use rocephin - Obtain 2 D ECHO to evaluate for endocarditis - Repeat blood cx to ensure clearance of bacteremia  - Leukocytosis improving  - Repeat lactic acid in am - We will continue to monitor in SDU   Active Problems: Acute metabolic encephalopathy - Likely related to sepsis and multiple sources of infection - No significant changes in her mental  status in past 24 hours   Hyperglycemia - Continue sliding scale insulin - CBG's in past 24 hours 155 -204 - She is not on anti-hyperglycemics at home  Normocytic anemia  - Hemoglobin 8.1, down from 11.2 on the admission - Repeat hgb stable at 8.1 - Follow up CBC in am   DVT prophylaxis:  Lovenox subQ  Code Status: full code  Family Communication: No family at the bedside Disposition Plan: Patient is septic, not yet stable for discharge.    Consultants:   Palliative care  CCM - signed off 2/15  Procedures:   None  Antimicrobials:   Vancomycin and Zosyn 08/31/2016 -->  Azithromycin 08/31/2016 -->   Subjective: No overnight events.   Objective: Vitals:   09/02/16 0500 09/02/16 0700 09/02/16 0800 09/02/16 1213  BP:  (!) 85/51 (!) 102/44 110/69  Pulse:  74 69 78  Resp:  (!) 30 (!) 23 (!) 37  Temp:  97.8 F (36.6 C) 97.8 F (36.6 C) 97.9 F (36.6 C)  TempSrc:  Oral Oral Oral  SpO2:  99% 97% 98%  Weight: 83.3 kg (183 lb 10.3 oz)     Height:        Intake/Output Summary (Last 24 hours) at 09/02/16 1229 Last data filed at 09/02/16 1046  Gross per 24 hour  Intake          2361.25 ml  Output              500 ml  Net          1861.25 ml   Filed Weights   09/01/16 0050  09/01/16 0441 09/02/16 0500  Weight: 82.9 kg (182 lb 12.2 oz) 82.6 kg (182 lb 1.6 oz) 83.3 kg (183 lb 10.3 oz)    Examination:  General exam: Appears calm and comfortable, no distress  Respiratory system: Diminished breath sounds, no wheezing, no rhonchi  Cardiovascular system: S1 & S2 heard, RRR Gastrointestinal system: (+) BS, non tender abd  Central nervous system: Sleeping, briefly wakes up when called her name Extremities:  palpable pulses  Skin: Skin is warm and dry  Psychiatry: Unable to assess due to altered mental status   Data Reviewed: I have personally reviewed following labs and imaging studies  CBC:  Recent Labs Lab 08/31/16 1648 09/01/16 0209 09/02/16 0323  WBC  12.2* 39.1* 31.8*  NEUTROABS 11.1*  --   --   HGB 11.2* 8.1* 8.8*  HCT 36.4 26.4* 28.6*  MCV 90.5 90.1 89.7  PLT 336 275 832   Basic Metabolic Panel:  Recent Labs Lab 08/31/16 1648 09/01/16 0209 09/02/16 0323  NA 140  --  138  K 4.1  --  3.8  CL 103  --  105  CO2 26  --  25  GLUCOSE 336*  --  185*  BUN 12  --  14  CREATININE 0.69 0.58 0.50  CALCIUM 9.1  --  8.0*   GFR: Estimated Creatinine Clearance: 55.2 mL/min (by C-G formula based on SCr of 0.5 mg/dL). Liver Function Tests:  Recent Labs Lab 08/31/16 1648  AST 32  ALT 23  ALKPHOS 168*  BILITOT 1.6*  PROT 7.5  ALBUMIN 1.8*   No results for input(s): LIPASE, AMYLASE in the last 168 hours.  Recent Labs Lab 08/31/16 1648  AMMONIA 76*   Coagulation Profile:  Recent Labs Lab 08/31/16 1648  INR 1.33   Cardiac Enzymes: No results for input(s): CKTOTAL, CKMB, CKMBINDEX, TROPONINI in the last 168 hours. BNP (last 3 results) No results for input(s): PROBNP in the last 8760 hours. HbA1C: No results for input(s): HGBA1C in the last 72 hours. CBG:  Recent Labs Lab 09/01/16 2147 09/01/16 2332 09/02/16 0433 09/02/16 0848 09/02/16 1206  GLUCAP 204* 178* 180* 155* 204*   Lipid Profile: No results for input(s): CHOL, HDL, LDLCALC, TRIG, CHOLHDL, LDLDIRECT in the last 72 hours. Thyroid Function Tests: No results for input(s): TSH, T4TOTAL, FREET4, T3FREE, THYROIDAB in the last 72 hours. Anemia Panel: No results for input(s): VITAMINB12, FOLATE, FERRITIN, TIBC, IRON, RETICCTPCT in the last 72 hours. Urine analysis:    Component Value Date/Time   COLORURINE AMBER (A) 08/31/2016 1647   APPEARANCEUR HAZY (A) 08/31/2016 1647   LABSPEC 1.022 08/31/2016 1647   PHURINE 5.0 08/31/2016 1647   GLUCOSEU >=500 (A) 08/31/2016 1647   HGBUR MODERATE (A) 08/31/2016 1647   BILIRUBINUR NEGATIVE 08/31/2016 1647   KETONESUR NEGATIVE 08/31/2016 1647   PROTEINUR NEGATIVE 08/31/2016 1647   NITRITE POSITIVE (A) 08/31/2016  1647   LEUKOCYTESUR NEGATIVE 08/31/2016 1647   Sepsis Labs: _0 (procalcitonin:4,lacticidven:4)    C difficile quick scan w PCR reflex     Status: None   Collection Time: 08/31/16  4:53 PM  Result Value Ref Range Status   C Diff antigen NEGATIVE NEGATIVE Final   C Diff toxin NEGATIVE NEGATIVE Final   C Diff interpretation No C. difficile detected.  Final  MRSA PCR Screening     Status: None   Collection Time: 09/01/16  1:01 AM  Result Value Ref Range Status   MRSA by PCR NEGATIVE NEGATIVE Final      Radiology  Studies: Ct Head Wo Contrast Result Date: 08/31/2016 Chronic microvascular ischemia without acute intracranial abnormality. Electronically Signed   By: Ulyses Jarred M.D.   On: 08/31/2016 18:27   Dg Chest Portable 1 View Result Date: 08/31/2016 Lungs hypoexpanded. Mild patchy left central airspace opacities may reflect mild pneumonia, depending on the patient's symptoms. Electronically Signed   By: Garald Balding M.D.   On: 08/31/2016 17:14      Scheduled Meds: . cefTRIAXone (ROCEPHIN)  IV  2 g Intravenous Q24H  . chlorhexidine  15 mL Mouth Rinse BID  . enoxaparin (LOVENOX) injection  40 mg Subcutaneous Q24H  . insulin aspart  0-15 Units Subcutaneous Q4H  . mouth rinse  15 mL Mouth Rinse q12n4p   Continuous Infusions: . sodium chloride 75 mL/hr at 09/02/16 1000     LOS: 2 days    Time spent: 25 minutes  Greater than 50% of the time spent on counseling and coordinating the care.   Leisa Lenz, MD [facility] Pager [phone]  If 7PM-7AM, please contact night-coverage [email or web address] Password [facility] 09/02/2016, 12:29 PM

## 2016-09-02 NOTE — Progress Notes (Signed)
Palliative Care  I was able to speak with [name]' daughter (next of kin) over the phone. We discussed her mother's current issues, and the plan for management. I also discussed code status, which she related had been brought up multiple times in the emergency department. I offered a few things to consider related to code status, which she asked to think about and wished to speak with her uncle (pt's brother). She did want to sit down and formally discuss things at her mother's bedside, which I fully supported. Plan to meet tomorrow 2/16 at noon. She plans to talk with her uncle tonight about code status and end of life considerations.  PLAN Meet with daughter (next of kin) 2/16 at noon. Plan to discuss code status and provide update on hospitalization.   Charlynn Court AGNP-C Palliative Care [phone]  No charge note.

## 2016-09-02 NOTE — Consult Note (Signed)
Consultation Note Date: 09/02/2016   Patient Name: Toni [name]  DOB: 02/19/1933  MRN: [id number]  Age / Sex: 83 y.o., female  PCP: Lucianne Lei, MD Referring Physician: Robbie Lis, MD  Reason for Consultation: Establishing goals of care  HPI/Patient Profile: 83 y.o. female  with past medical history of DM2, HTN, HLD, chronic knee pain r/t several replacement surgeries, and PVD who presented to the ED from home with progressive altered mental status to the point of unresponsive and tremulous. She was subsequently admitted on 08/31/2016 with sepsis, with work-up showing strep bacteremia, E. Coli UTI, and community acquired pneumonia. Of note, at baseline she is predominantly bed/chair bound due to her knee pain and requires assistance with ADLs. That said, she is mentally intact and socially active with friends and family. Palliative consulted to assist in goals of care.  Clinical Assessment and Goals of Care: Toni [name] is looking much better today. She is fully alert and oriented, witty and quick in her responses, and back to her baseline per her family at the bedside. Her sister, daughter, and granddaughter were present for the family meeting. I provided an overview of the medical things going on. This included an explanation of her multiple infections, the organisms found in cultures, and the plan for antibiotic and supportive management. I also emphasized that she was clinically much better today and is visibly improving.   I then brought up how this was a good opportunity to discuss advanced care planning. I expressed that I was very concerned when I saw her yesterday, and worried that if something happened her wants for the end of life were not known. Toni [name] appreciated the opportunity to discuss these things, as she hadn't had the chance to think about it and voice her decision. We talked specifically about cardiac or respiratory failure, and I  explained the two pathways that could be taken: DNR versus Full code. After hearing about the options, she felt DNR was best aligned with her goals. She feels that when it is her time, she should be let go and stay comfortable. Her family agreed with her decision.   Primary Decision Maker PATIENT   SUMMARY OF RECOMMENDATIONS    DNR, otherwise full scope treatment  Pt and family agree with SNF/rehab at discharge to optimize her function status and independence  Code Status/Advance Care Planning:  DNR  Symptom Management:   Pt has no acute issues and feels quite relaxed and happy to have her family here  Palliative Prophylaxis:   Bowel Regimen, Oral Care and Turn Reposition  Additional Recommendations (Limitations, Scope, Preferences):  Full Scope Treatment  Psycho-social/Spiritual:   Desire for further Chaplaincy support:no  Prognosis:   Unable to determine  Discharge Planning: SNF with Rehab      Primary Diagnoses: Present on Admission: . (Resolved) UTI (urinary tract infection) . SIRS (systemic inflammatory response syndrome) (HCC) . Altered mental status . Hyperglycemia . High fever . (Resolved) CAP (community acquired pneumonia) . Pyuria . Community acquired pneumonia . CAP (community acquired pneumonia)   I have reviewed the medical record, interviewed the patient and family, and examined the patient. The following aspects are pertinent.  Past Medical History:  Diagnosis Date  . Diabetes mellitus without complication ([place])   . Peripheral vascular disease ([place])   . Plantar fascial fibromatosis    Social History   Social History  . Marital status: Married    Spouse name: N/A  . Number of children: N/A  . Years  of education: N/A   Social History Main Topics  . Smoking status: Never Smoker  . Smokeless tobacco: Never Used  . Alcohol use No  . Drug use: No  . Sexual activity: Not Asked   Other Topics Concern  . None   Social History  Narrative  . None   Family History  Problem Relation Age of Onset  . Hypertension Mother   . Hyperlipidemia Mother     before age 82  . Heart disease Father   . Hypertension Father   . Hyperlipidemia Father     before age 56  . Diabetes Sister   . Other Sister     amputation   Scheduled Meds: . azithromycin  500 mg Intravenous Q24H  . chlorhexidine  15 mL Mouth Rinse BID  . enoxaparin (LOVENOX) injection  40 mg Subcutaneous Q24H  . insulin aspart  0-15 Units Subcutaneous Q4H  . mouth rinse  15 mL Mouth Rinse q12n4p  . oseltamivir  30 mg Oral BID  . piperacillin-tazobactam (ZOSYN)  IV  3.375 g Intravenous Q8H  . vancomycin  750 mg Intravenous Q12H   Continuous Infusions: . sodium chloride 75 mL/hr at 09/01/16 0137   PRN Meds:. No Known Allergies   Review of Systems  Constitutional: Positive for fatigue. Negative for activity change, appetite change and unexpected weight change.  HENT: Negative for congestion, facial swelling, hearing loss, sinus pressure, sore throat and trouble swallowing.   Eyes: Negative for visual disturbance.  Respiratory: Positive for cough. Negative for chest tightness, shortness of breath and wheezing.   Cardiovascular: Negative for chest pain.  Gastrointestinal: Negative for abdominal distention, abdominal pain, constipation and diarrhea.  Musculoskeletal: Positive for gait problem (r/t chronic knee pain). Negative for back pain.  Neurological: Positive for weakness. Negative for dizziness, speech difficulty and headaches.  Psychiatric/Behavioral: Negative for confusion and sleep disturbance. The patient is not nervous/anxious.    Physical Exam  Constitutional: She is oriented to person, place, and time. She appears well-developed and well-nourished.  HENT:  Head: Normocephalic and atraumatic.  Mouth/Throat: Oropharynx is clear and moist.  Eyes: EOM are normal.  Neck: Normal range of motion.  Cardiovascular: Normal rate and regular rhythm.     Pulmonary/Chest: Effort normal. No respiratory distress.  occasional dry cough  Abdominal: Soft.  Musculoskeletal:  Deferred, per pt request  Neurological: She is alert and oriented to person, place, and time.  Skin: Skin is warm and dry. There is pallor.  Psychiatric: She has a normal mood and affect. Her behavior is normal. Judgment and thought content normal.    Vital Signs: BP (!) 85/51 (BP Location: Right Arm)   Pulse 74   Temp 97.8 F (36.6 C) (Oral)   Resp (!) 30   Ht 5\' 2"  (1.575 m)   Wt 83.3 kg (183 lb 10.3 oz)   SpO2 99%   BMI 33.59 kg/m  Pain Assessment: No/denies pain     SpO2: SpO2: 99 % O2 Device:SpO2: 99 % O2 Flow Rate: .   IO: Intake/output summary:  Intake/Output Summary (Last 24 hours) at 09/02/16 0752 Last data filed at 09/02/16 0245  Gross per 24 hour  Intake          1363.75 ml  Output              500 ml  Net           863.75 ml    LBM: Last BM Date: 08/31/16 Baseline Weight: Weight: 99.8 kg (220 lb)  Most recent weight: Weight: 83.3 kg (183 lb 10.3 oz)     Palliative Assessment/Data: PPS 50-60%   Flowsheet Rows   Flowsheet Row Most Recent Value  Intake Tab  Referral Department  Hospitalist  Unit at Time of Referral  Intermediate Care Unit  Palliative Care Primary Diagnosis  Sepsis/Infectious Disease  Date Notified  09/01/16  Palliative Care Type  New Palliative care  Reason for referral  Clarify Goals of Care  Date of Admission  08/31/16  # of days IP prior to Palliative referral  1  Clinical Assessment  Psychosocial & Spiritual Assessment  Palliative Care Outcomes     Time Total: 70 minutes Greater than 50%  of this time was spent counseling and coordinating care related to the above assessment and plan.  Signed by: Charlynn Court, NP Palliative Medicine Team Pager # [phone] (M-F 7a-5p) Team Phone # [phone] (Nights/Weekends)

## 2016-09-03 ENCOUNTER — Ambulatory Visit (HOSPITAL_COMMUNITY): Payer: Medicare Other

## 2016-09-03 DIAGNOSIS — B9620 Unspecified Escherichia coli [E. coli] as the cause of diseases classified elsewhere: Secondary | ICD-10-CM

## 2016-09-03 DIAGNOSIS — G9341 Metabolic encephalopathy: Secondary | ICD-10-CM

## 2016-09-03 DIAGNOSIS — A419 Sepsis, unspecified organism: Secondary | ICD-10-CM

## 2016-09-03 DIAGNOSIS — Z7189 Other specified counseling: Secondary | ICD-10-CM

## 2016-09-03 DIAGNOSIS — Z515 Encounter for palliative care: Secondary | ICD-10-CM

## 2016-09-03 DIAGNOSIS — R7881 Bacteremia: Secondary | ICD-10-CM

## 2016-09-03 LAB — ECHOCARDIOGRAM COMPLETE
CHL CUP MV M VEL: 92.7
CHL CUP RV SYS PRESS: 58 mmHg
FS: 23 % — AB (ref 28–44)
HEIGHTINCHES: 62 in
IV/PV OW: 0.91
LA ID, A-P, ES: 43 mm
LA diam end sys: 43 mm
LA vol A4C: 71.1 ml
LADIAMINDEX: 2.15 cm/m2
LAVOL: 65.9 mL
LAVOLIN: 32.9 mL/m2
LDCA: 2.84 cm2
LV TDI E'LATERAL: 6.89
LV e' LATERAL: 6.89 cm/s
LVOTD: 19 mm
MV Annulus VTI: 26.1 cm
Mean grad: 4 mmHg
PW: 10.3 mm — AB (ref 0.6–1.1)
Reg peak vel: 371 cm/s
TAPSE: 16.7 mm
TDI e' medial: 6.97
TRMAXVEL: 371 cm/s
WEIGHTICAEL: 3100.55 [oz_av]

## 2016-09-03 LAB — CULTURE, BLOOD (ROUTINE X 2)

## 2016-09-03 LAB — LACTIC ACID, PLASMA: Lactic Acid, Venous: 1.4 mmol/L (ref 0.5–1.9)

## 2016-09-03 LAB — BASIC METABOLIC PANEL
ANION GAP: 5 (ref 5–15)
BUN: 7 mg/dL (ref 6–20)
CALCIUM: 8.3 mg/dL — AB (ref 8.9–10.3)
CHLORIDE: 107 mmol/L (ref 101–111)
CO2: 26 mmol/L (ref 22–32)
Creatinine, Ser: 0.41 mg/dL — ABNORMAL LOW (ref 0.44–1.00)
GFR calc non Af Amer: >60 mL/min (ref >60)
Glucose, Bld: 101 mg/dL — ABNORMAL HIGH (ref 65–99)
Potassium: 3.4 mmol/L — ABNORMAL LOW (ref 3.5–5.1)
SODIUM: 138 mmol/L (ref 135–145)

## 2016-09-03 LAB — GLUCOSE, CAPILLARY
GLUCOSE-CAPILLARY: 130 mg/dL — AB (ref 65–99)
GLUCOSE-CAPILLARY: 197 mg/dL — AB (ref 65–99)
Glucose-Capillary: 132 mg/dL — ABNORMAL HIGH (ref 65–99)
Glucose-Capillary: 167 mg/dL — ABNORMAL HIGH (ref 65–99)
Glucose-Capillary: 211 mg/dL — ABNORMAL HIGH (ref 65–99)
Glucose-Capillary: 85 mg/dL (ref 65–99)

## 2016-09-03 LAB — CBC
HEMATOCRIT: 29.5 % — AB (ref 36.0–46.0)
HEMOGLOBIN: 9 g/dL — AB (ref 12.0–15.0)
MCH: 27.4 pg (ref 26.0–34.0)
MCHC: 30.5 g/dL (ref 30.0–36.0)
MCV: 89.9 fL (ref 78.0–100.0)
Platelets: 273 10*3/uL (ref 150–400)
RBC: 3.28 MIL/uL — ABNORMAL LOW (ref 3.87–5.11)
RDW: 15.2 % (ref 11.5–15.5)
WBC: 23.3 10*3/uL — AB (ref 4.0–10.5)

## 2016-09-03 MED ORDER — SENNA 8.6 MG PO TABS
1.0000 | ORAL_TABLET | Freq: Two times a day (BID) | ORAL | Status: DC
  Administered 2016-09-03 – 2016-09-09 (×5): 8.6 mg via ORAL
  Filled 2016-09-03 (×10): qty 1

## 2016-09-03 MED ORDER — INSULIN ASPART 100 UNIT/ML ~~LOC~~ SOLN
0.0000 [IU] | Freq: Three times a day (TID) | SUBCUTANEOUS | Status: DC
  Administered 2016-09-03: 3 [IU] via SUBCUTANEOUS

## 2016-09-03 MED ORDER — INSULIN ASPART 100 UNIT/ML ~~LOC~~ SOLN
0.0000 [IU] | Freq: Every day | SUBCUTANEOUS | Status: DC
  Administered 2016-09-06: 2 [IU] via SUBCUTANEOUS

## 2016-09-03 MED ORDER — INSULIN ASPART 100 UNIT/ML ~~LOC~~ SOLN
0.0000 [IU] | Freq: Three times a day (TID) | SUBCUTANEOUS | Status: DC
Start: 2016-09-04 — End: 2016-09-09
  Administered 2016-09-04: 5 [IU] via SUBCUTANEOUS
  Administered 2016-09-04: 3 [IU] via SUBCUTANEOUS
  Administered 2016-09-04: 2 [IU] via SUBCUTANEOUS
  Administered 2016-09-05: 8 [IU] via SUBCUTANEOUS
  Administered 2016-09-05 – 2016-09-06 (×2): 5 [IU] via SUBCUTANEOUS
  Administered 2016-09-07 – 2016-09-08 (×4): 2 [IU] via SUBCUTANEOUS
  Administered 2016-09-08 (×2): 3 [IU] via SUBCUTANEOUS
  Administered 2016-09-09: 2 [IU] via SUBCUTANEOUS
  Administered 2016-09-09: 5 [IU] via SUBCUTANEOUS

## 2016-09-03 MED ORDER — POTASSIUM CHLORIDE CRYS ER 20 MEQ PO TBCR
40.0000 meq | EXTENDED_RELEASE_TABLET | Freq: Once | ORAL | Status: AC
  Administered 2016-09-03: 40 meq via ORAL
  Filled 2016-09-03: qty 2

## 2016-09-03 NOTE — Progress Notes (Signed)
  Echocardiogram 2D Echocardiogram has been performed.  Toni [name] 09/03/2016, 4:54 PM

## 2016-09-03 NOTE — Care Management Important Message (Signed)
Important Message  Patient Details  Name: Toni [name] MRN: [id number] Date of Birth: June 17, 1933   Medicare Important Message Given:  Yes    [name] Abena 09/03/2016, 1:02 PM

## 2016-09-03 NOTE — Progress Notes (Signed)
Attempted to call report. Nurse busy at this time.

## 2016-09-03 NOTE — Progress Notes (Addendum)
Patient ID: Toni [name], female   DOB: Nov 07, 1932, 83 y.o.   MRN: [id number]  PROGRESS NOTE    FAVOR KREH  ERX:[id number] DOB: Feb 26, 1933 DOA: 08/31/2016  PCP: Elyn Peers, MD   Brief Narrative:  83 y.o. female with history of HTN who presented with altered mental status for past few days prior to this admission. Per family report, patient apparently had a cough 2 weeks ago, got a flu shot one week ago and that after that started to get very weak and confused. In ED, blood pressure was as low as 76/43. Tmax 103.79F, HR 57-106, RR 22-46. Blood work was notable for glucose of 336, lactic acid 6.29, white blood cell count 12.2 and then repeat count 39.1. Ethanol was within normal limits. Urinalysis showed positive nitrites and many bacteria. Urine and blood cultures were obtained, respiratory virus panel is pending. Chest x-ray showed left central airspace opacity. Patient was started on empiric vancomycin and Zosyn for sepsis thought to be due to UTI and possibly pneumonia. Further work up revealed strep bacteremia.   Assessment & Plan:   Principal Problem: Sepsis secondary to community acquired pneumonia and streptococcus bacteremia and E.Coli UTI / leukocytosis - Sepsis criteria met on the admission with fever, hypotension, tachycardia, tachypnea, lactic acidosis, leukocytosis. Procalcitonin elevated at 68.82 - Multiple sources of infection: strep bacteremia, E.Coli UTI (blood cx growing strep species, sens report pending. UCx growing E.Coli) - Influenza was negative so we stopped tamiflu 2/15 - Stop zosyn, azithro and vanco and use rocephin - Follow up 2 D ECHO to evaluate for endocarditis - Repeat blood cx today to ensure clearance of bacteremia  - Leukocytosis improving - 39 --> 23 - Repeat lactic acid WNL this am  Active Problems: Acute metabolic encephalopathy - Likely related to sepsis and multiple sources of infection - Much more alert this am, oriented to time, place and  person   Hyperglycemia - Continue sliding scale insulin - She is not on anti-hyperglycemics at home  Normocytic anemia  - Hemoglobin 8.1, down from 11.2 on the admission - Repeat hgb stable at 9  Hypokalemia - Due to sepsis - Supplemented - Follow up BMP in am   DVT prophylaxis:  Lovenox subQ  Code Status: full code  Family Communication: No family at the bedside Disposition Plan: repeat blood cx today, obtain ECHO, most likely home in am if all those studies normal    Consultants:   Palliative care  CCM - signed off 2/15  Procedures:   None  Antimicrobials:   Vancomycin and Zosyn 08/31/2016 --> 2/15  Rocephin 09/02/2016 -->  Azithromycin 08/31/2016 --> 09/02/2016    Subjective: No overnight events.   Objective: Vitals:   09/03/16 0600 09/03/16 0800 09/03/16 1000 09/03/16 1100  BP: 133/67 (!) 113/94 112/73   Pulse: 72 92 86   Resp: (!) 26 17 (!) 28   Temp:  98.7 F (37.1 C)  98.7 F (37.1 C)  TempSrc:  Oral  Oral  SpO2: 97% 99% 96%   Weight:      Height:        Intake/Output Summary (Last 24 hours) at 09/03/16 1128 Last data filed at 09/03/16 1100  Gross per 24 hour  Intake             2570 ml  Output              100 ml  Net             2470 ml  Filed Weights   09/01/16 0441 09/02/16 0500 09/03/16 0300  Weight: 82.6 kg (182 lb 1.6 oz) 83.3 kg (183 lb 10.3 oz) 88.6 kg (195 lb 5.2 oz)    Examination:  General exam: no distress  Respiratory system: Diminished breath sounds, no wheezing, no rhonchi  Cardiovascular system: S1 & S2 heard, rate controlled  Gastrointestinal system: (+) BS, non tender abd  Central nervous system: alert and oriented to time, place and person  Extremities:palpable pulses, no tenderness Skin: Skin is warm and dry, no lesions, no ulcers  Psychiatry: Normal mood and behavior   Data Reviewed: I have personally reviewed following labs and imaging studies  CBC:  Recent Labs Lab 08/31/16 1648 09/01/16 0209  09/02/16 0323 09/03/16 0416  WBC 12.2* 39.1* 31.8* 23.3*  NEUTROABS 11.1*  --   --   --   HGB 11.2* 8.1* 8.8* 9.0*  HCT 36.4 26.4* 28.6* 29.5*  MCV 90.5 90.1 89.7 89.9  PLT 336 275 267 720   Basic Metabolic Panel:  Recent Labs Lab 08/31/16 1648 09/01/16 0209 09/02/16 0323 09/03/16 0416  NA 140  --  138 138  K 4.1  --  3.8 3.4*  CL 103  --  105 107  CO2 26  --  25 26  GLUCOSE 336*  --  185* 101*  BUN 12  --  14 7  CREATININE 0.69 0.58 0.50 0.41*  CALCIUM 9.1  --  8.0* 8.3*   GFR: Estimated Creatinine Clearance: 57 mL/min (by C-G formula based on SCr of 0.41 mg/dL (L)). Liver Function Tests:  Recent Labs Lab 08/31/16 1648  AST 32  ALT 23  ALKPHOS 168*  BILITOT 1.6*  PROT 7.5  ALBUMIN 1.8*   No results for input(s): LIPASE, AMYLASE in the last 168 hours.  Recent Labs Lab 08/31/16 1648  AMMONIA 76*   Coagulation Profile:  Recent Labs Lab 08/31/16 1648  INR 1.33   Cardiac Enzymes: No results for input(s): CKTOTAL, CKMB, CKMBINDEX, TROPONINI in the last 168 hours. BNP (last 3 results) No results for input(s): PROBNP in the last 8760 hours. HbA1C: No results for input(s): HGBA1C in the last 72 hours. CBG:  Recent Labs Lab 09/02/16 1524 09/02/16 2045 09/03/16 0028 09/03/16 0259 09/03/16 0854  GLUCAP 210* 223* 167* 130* 85   Lipid Profile: No results for input(s): CHOL, HDL, LDLCALC, TRIG, CHOLHDL, LDLDIRECT in the last 72 hours. Thyroid Function Tests: No results for input(s): TSH, T4TOTAL, FREET4, T3FREE, THYROIDAB in the last 72 hours. Anemia Panel: No results for input(s): VITAMINB12, FOLATE, FERRITIN, TIBC, IRON, RETICCTPCT in the last 72 hours. Urine analysis:    Component Value Date/Time   COLORURINE AMBER (A) 08/31/2016 1647   APPEARANCEUR HAZY (A) 08/31/2016 1647   LABSPEC 1.022 08/31/2016 1647   PHURINE 5.0 08/31/2016 1647   GLUCOSEU >=500 (A) 08/31/2016 1647   HGBUR MODERATE (A) 08/31/2016 1647   BILIRUBINUR NEGATIVE 08/31/2016  1647   KETONESUR NEGATIVE 08/31/2016 1647   PROTEINUR NEGATIVE 08/31/2016 1647   NITRITE POSITIVE (A) 08/31/2016 1647   LEUKOCYTESUR NEGATIVE 08/31/2016 1647   Sepsis Labs: @LABRCNTIP (procalcitonin:4,lacticidven:4)    C difficile quick scan w PCR reflex     Status: None   Collection Time: 08/31/16  4:53 PM  Result Value Ref Range Status   C Diff antigen NEGATIVE NEGATIVE Final   C Diff toxin NEGATIVE NEGATIVE Final   C Diff interpretation No C. difficile detected.  Final  MRSA PCR Screening     Status: None   Collection  Time: 09/01/16  1:01 AM  Result Value Ref Range Status   MRSA by PCR NEGATIVE NEGATIVE Final      Radiology Studies: Ct Head Wo Contrast Result Date: 08/31/2016 Chronic microvascular ischemia without acute intracranial abnormality. Electronically Signed   By: Ulyses Jarred M.D.   On: 08/31/2016 18:27   Dg Chest Portable 1 View Result Date: 08/31/2016 Lungs hypoexpanded. Mild patchy left central airspace opacities may reflect mild pneumonia, depending on the patient's symptoms. Electronically Signed   By: Garald Balding M.D.   On: 08/31/2016 17:14      Scheduled Meds: . cefTRIAXone (ROCEPHIN)  IV  2 g Intravenous Q24H  . chlorhexidine  15 mL Mouth Rinse BID  . enoxaparin (LOVENOX) injection  40 mg Subcutaneous Q24H  . insulin aspart  0-15 Units Subcutaneous Q4H  . mouth rinse  15 mL Mouth Rinse q12n4p   Continuous Infusions: . sodium chloride 75 mL/hr at 09/03/16 0840     LOS: 3 days    Time spent: 25 minutes  Greater than 50% of the time spent on counseling and coordinating the care.   Leisa Lenz, MD [facility] Pager [phone]  If 7PM-7AM, please contact night-coverage [email or web address] Password [facility] 09/03/2016, 11:28 AM

## 2016-09-03 NOTE — Evaluation (Signed)
Physical Therapy Evaluation Patient Details Name: Toni [name] MRN: [id number] DOB: [birth year] Today's Date: 09/03/2016   History of Present Illness  Pt is an 83 y/o female admitted secondary to AMS, found to be septic secondary to a UTI. PMH including but not limited to DM and PVD.  Clinical Impression  Pt presented supine in bed with HOB elevated, awake and willing to participate in therapy session. Prior to admission, pt reported that she used a rollator to ambulate very short distances within her home, and required assistance from her granddaughters for ADLs. Pt currently requires min guard for bed mobility and mod A x2 for transfers with RW. During stand-pivot transfer, pt with buckling of R knee requiring mod A to maintain upright standing position. PT recommending pt d/c to SNF for continued therapy services. All VSS throughout. Pt would continue to benefit from skilled physical therapy services at this time while admitted and after d/c to address her below listed limitations in order to improve her overall safety and independence with functional mobility.      Follow Up Recommendations SNF    Equipment Recommendations  None recommended by PT    Recommendations for Other Services       Precautions / Restrictions Precautions Precautions: Fall Precaution Comments: pt reported multiple falls in the last six months with granddaughter present to confirm Restrictions Weight Bearing Restrictions: No      Mobility  Bed Mobility Overal bed mobility: Needs Assistance Bed Mobility: Supine to Sit     Supine to sit: Min guard;HOB elevated     General bed mobility comments: pt required increased time, use of bed rails, min guard for safety  Transfers Overall transfer level: Needs assistance Equipment used: Rolling walker (2 wheeled) Transfers: Sit to/from [facility] Sit to Stand: Mod assist Stand pivot transfers: Mod assist;+2 physical assistance;+2  safety/equipment       General transfer comment: pt required increased time, VC'ing for bilateral hand placement, mod A to rise from bed and mod A x2 for pivotal movements to recliner  Ambulation/Gait                Stairs            Wheelchair Mobility    Modified Rankin (Stroke Patients Only)       Balance Overall balance assessment: Needs assistance;History of Falls Sitting-balance support: Feet supported;Bilateral upper extremity supported Sitting balance-Leahy Scale: Poor Sitting balance - Comments: pt reliant on bilateral UE supports on bed   Standing balance support: During functional activity;Bilateral upper extremity supported Standing balance-Leahy Scale: Poor Standing balance comment: pt reliant on bilateral UE supports on RW                             Pertinent Vitals/Pain Pain Assessment: No/denies pain    Home Living Family/patient expects to be discharged to:: Skilled nursing facility Living Arrangements: Other relatives (granddaughters)               Additional Comments: pt lives with granddaughters but both would like for pt to d/c to SNF to get stronger prior to returning home    Prior Function Level of Independence: Needs assistance   Gait / Transfers Assistance Needed: pt reported that she is able to perform transfers with mod I using a rollator and ambulate short distances within her home with rollator  ADL's / Homemaking Assistance Needed: pt reported that her granddaughter's assist her with dressing  and bathing        Hand Dominance        Extremity/Trunk Assessment   Upper Extremity Assessment Upper Extremity Assessment: Generalized weakness    Lower Extremity Assessment Lower Extremity Assessment: Generalized weakness       Communication   Communication: No difficulties  Cognition Arousal/Alertness: Awake/alert Behavior During Therapy: WFL for tasks assessed/performed Overall Cognitive Status:  Impaired/Different from baseline Area of Impairment: Memory;Problem solving;Safety/judgement     Memory: Decreased short-term memory   Safety/Judgement: Decreased awareness of safety   Problem Solving: Slow processing;Decreased initiation;Difficulty sequencing;Requires verbal cues;Requires tactile cues      General Comments      Exercises     Assessment/Plan    PT Assessment Patient needs continued PT services  PT Problem List Decreased strength;Decreased activity tolerance;Decreased balance;Decreased mobility;Decreased coordination;Decreased cognition;Decreased knowledge of use of DME;Decreased safety awareness          PT Treatment Interventions DME instruction;Gait training;Stair training;Functional mobility training;Therapeutic activities;Therapeutic exercise;Balance training;Neuromuscular re-education;Cognitive remediation;Patient/family education    PT Goals (Current goals can be found in the Care Plan section)  Acute Rehab PT Goals Patient Stated Goal: go to rehab to get stronger PT Goal Formulation: With patient/family Time For Goal Achievement: 09/17/16 Potential to Achieve Goals: Fair    Frequency Min 2X/week   Barriers to discharge        Co-evaluation               End of Session Equipment Utilized During Treatment: Gait belt Activity Tolerance: Patient limited by fatigue Patient left: in chair;with call bell/phone within reach;with chair alarm set;with family/visitor present Nurse Communication: Mobility status         Time: 1130-1145 PT Time Calculation (min) (ACUTE ONLY): 15 min   Charges:   PT Evaluation $PT Eval Moderate Complexity: 1 Procedure     PT G CodesClearnce Sorrel Breslin Burklow 09/03/2016, 11:53 AM Sherie Don, PT, DPT [phone]

## 2016-09-03 NOTE — Progress Notes (Signed)
Report called pt to transfer to 6N16 via bed with belongings.

## 2016-09-03 NOTE — Progress Notes (Signed)
Pt admitted to 6N16 via bed from 4E.  Pt AAO X4.  Pt on RA.  Pt has 22G to RT FA with fluids infusing.  Belongings to bedside.  Report rcvd from Tera Partridge, RN.  Pt has no complaints at the moment.  Will continue to monitor.

## 2016-09-04 LAB — BASIC METABOLIC PANEL
Anion gap: 5 (ref 5–15)
BUN: <5 mg/dL — ABNORMAL LOW (ref 6–20)
CHLORIDE: 108 mmol/L (ref 101–111)
CO2: 26 mmol/L (ref 22–32)
CREATININE: 0.37 mg/dL — AB (ref 0.44–1.00)
Calcium: 8.4 mg/dL — ABNORMAL LOW (ref 8.9–10.3)
GFR calc Af Amer: >60 mL/min (ref >60)
GFR calc non Af Amer: >60 mL/min (ref >60)
Glucose, Bld: 143 mg/dL — ABNORMAL HIGH (ref 65–99)
POTASSIUM: 4 mmol/L (ref 3.5–5.1)
SODIUM: 139 mmol/L (ref 135–145)

## 2016-09-04 LAB — CBC
HEMATOCRIT: 30.4 % — AB (ref 36.0–46.0)
Hemoglobin: 9.4 g/dL — ABNORMAL LOW (ref 12.0–15.0)
MCH: 27.8 pg (ref 26.0–34.0)
MCHC: 30.9 g/dL (ref 30.0–36.0)
MCV: 89.9 fL (ref 78.0–100.0)
PLATELETS: 263 10*3/uL (ref 150–400)
RBC: 3.38 MIL/uL — ABNORMAL LOW (ref 3.87–5.11)
RDW: 15.3 % (ref 11.5–15.5)
WBC: 15 10*3/uL — AB (ref 4.0–10.5)

## 2016-09-04 LAB — GLUCOSE, CAPILLARY
GLUCOSE-CAPILLARY: 182 mg/dL — AB (ref 65–99)
GLUCOSE-CAPILLARY: 198 mg/dL — AB (ref 65–99)
Glucose-Capillary: 150 mg/dL — ABNORMAL HIGH (ref 65–99)
Glucose-Capillary: 211 mg/dL — ABNORMAL HIGH (ref 65–99)

## 2016-09-04 NOTE — NC FL2 (Signed)
[place] LEVEL OF CARE SCREENING TOOL     IDENTIFICATION  Patient Name: Toni [name] Birthdate: [birth year] Sex: female Admission Date (Current Location): 08/31/2016  [facility] and [place] Number:  Herbalist and Address:  [facility] [place], [place] [address] [place],  [address]      Provider Number: [id number]  Attending Physician Name and Address:  Robbie Lis, MD  Relative Name and Phone Number:       Current Level of Care: Hospital Recommended Level of Care: [place] Prior Approval Number:    Date Approved/Denied:   PASRR Number: [id number]  Discharge Plan: SNF    Current Diagnoses: Patient Active Problem List   Diagnosis Date Noted  . Sepsis ([place])   . Goals of care, counseling/discussion   . Palliative care by specialist   . Streptococcal sepsis ([place]) 09/02/2016  . Streptococcal bacteremia 09/02/2016  . E. coli UTI 09/02/2016  . Acute metabolic encephalopathy [id number]  . Leukocytosis 09/02/2016  . Community acquired pneumonia 08/31/2016    Orientation RESPIRATION BLADDER Height & Weight     Self, Time, Situation, Place  Normal Incontinent Weight: 193 lb 12.6 oz (87.9 kg) Height:  5\' 2"  (157.5 cm)  BEHAVIORAL SYMPTOMS/MOOD NEUROLOGICAL BOWEL NUTRITION STATUS      Incontinent (at times) Diet (Carb Modified )  AMBULATORY STATUS COMMUNICATION OF NEEDS Skin   Extensive Assist Verbally Normal                       Personal Care Assistance Level of Assistance  Bathing, Feeding, Dressing Bathing Assistance: Maximum assistance Feeding assistance: Independent Dressing Assistance: Maximum assistance     Functional Limitations Info  Sight, Hearing, Speech Sight Info: Adequate Hearing Info: Adequate Speech Info: Adequate    SPECIAL CARE FACTORS FREQUENCY  PT (By licensed PT), OT (By licensed OT)                    Contractures Contractures Info: Not present    Additional  Factors Info  Code Status, Allergies, Insulin Sliding Scale Code Status Info: Full Allergies Info: NKDA           Current Medications (09/04/2016):  This is the current hospital active medication list Current Facility-Administered Medications  Medication Dose Route Frequency Provider Last Rate Last Dose  . 0.45 % sodium chloride infusion   Intravenous Continuous Roney Jaffe, MD 75 mL/hr at 09/03/16 2200    . acetaminophen (TYLENOL) tablet 650 mg  650 mg Oral Q6H PRN Robbie Lis, MD   650 mg at 09/04/16 1235  . cefTRIAXone (ROCEPHIN) 2 g in dextrose 5 % 50 mL IVPB  2 g Intravenous Q24H Robbie Lis, MD   2 g at 09/04/16 1227  . enoxaparin (LOVENOX) injection 40 mg  40 mg Subcutaneous Q24H Roney Jaffe, MD   40 mg at 09/04/16 1227  . insulin aspart (novoLOG) injection 0-15 Units  0-15 Units Subcutaneous TID WC Jani Gravel, MD   5 Units at 09/04/16 1227  . insulin aspart (novoLOG) injection 0-5 Units  0-5 Units Subcutaneous QHS Jani Gravel, MD      . senna [facility]) tablet 8.6 mg  1 tablet Oral BID Jannette Fogo, NP   8.6 mg at 09/04/16 1021     Discharge Medications: Please see discharge summary for a list of discharge medications.  Relevant Imaging Results:  Relevant Lab Results:   Additional Information [id number]  Sindy Messing  Lelan Pons, LCSW

## 2016-09-04 NOTE — Clinical Social Work Placement (Signed)
   CLINICAL SOCIAL WORK PLACEMENT  NOTE  Date:  09/04/2016  Patient Details  Name: Toni [name] MRN: [id number] Date of Birth: 02-12-1933  Clinical Social Work is seeking post-discharge placement for this patient at the [place] level of care (*CSW will initial, date and re-position this form in  chart as items are completed):  Yes   Patient/family provided with [place] Work Department's list of facilities offering this level of care within the geographic area requested by the patient (or if unable, by the patient's family).  Yes   Patient/family informed of their freedom to choose among providers that offer the needed level of care, that participate in Medicare, Medicaid or managed care program needed by the patient, have an available bed and are willing to accept the patient.      Patient/family informed of [place] [facility]'s ownership interest in [facility] and [facility], as well as of the fact that they are under no obligation to receive care at these facilities.  PASRR submitted to EDS on 09/04/16     PASRR number received on 09/04/16     Existing PASRR number confirmed on       FL2 transmitted to all facilities in geographic area requested by pt/family on       FL2 transmitted to all facilities within larger geographic area on       Patient informed that his/her managed care company has contracts with or will negotiate with certain facilities, including the following:            Patient/family informed of bed offers received.  Patient chooses bed at       Physician recommends and patient chooses bed at      Patient to be transferred to   on  .  Patient to be transferred to facility by       Patient family notified on   of transfer.  Name of family member notified:        PHYSICIAN       Additional Comment:    _______________________________________________ Boone Master, [place] 09/04/2016, 12:44 PM

## 2016-09-04 NOTE — Clinical Social Work Note (Signed)
Clinical Social Work Assessment  Patient Details  Name: Toni [name] MRN: [id number] Date of Birth: 08-04-1932  Date of referral:  09/04/16               Reason for consult:  Facility Placement                Permission sought to share information with:  Family Supports Permission granted to share information::  Yes, Verbal Permission Granted  Name::     Toni [name]::     Relationship::  Dtr  Contact Information:     Housing/Transportation Living arrangements for the past 2 months:  Single Family Home Source of Information:  Patient Patient Interpreter Needed:  None Criminal Activity/Legal Involvement Pertinent to Current Situation/Hospitalization:  No - Comment as needed Significant Relationships:  Adult Children Lives with:  Self Do you feel safe going back to the place where you live?  Yes Need for family participation in patient care:  Yes (Comment)  Care giving concerns:  PT recommends SNF placement.   Social Worker assessment / plan:  CSW received referral to assist with SNF placement. CSW reviewed chart and met with pt and dtr at bedside. Pt and dtr are aware of consult and agreeable to placement.  CSW provided SNF list and explained process. Pt has been to SNF in [place] after knee surgery but has not been admitted to any facilities in [place]. Pt and dtr agreeable to [facility] search. Dtr plans to tour facilities and to assist with placement.  FL2 and PASRR completed and search initiated. CSW will follow up with bed offers.  Employment status:  Retired Forensic scientist:  Medicare PT Recommendations:  [place] / Referral to community resources:  [place]  Patient/Family's Response to care:  Pt and family engaged and agreeable to SNF.  Patient/Family's Understanding of and Emotional Response to Diagnosis, Current Treatment, and Prognosis:  Pt hopeful for a quick recovery so that she can return home.    Emotional Assessment Appearance:  Appears stated age, Well-Groomed Attitude/Demeanor/Rapport:  Other (Engaged) Affect (typically observed):  Appropriate Orientation:  Oriented to Self, Oriented to Place, Oriented to  Time, Oriented to Situation Alcohol / Substance use:  Not Applicable Psych involvement (Current and /or in the community):  No (Comment)  Discharge Needs  Concerns to be addressed:  No discharge needs identified Readmission within the last 30 days:  No Current discharge risk:  None Barriers to Discharge:  No Barriers Identified   Boone Master, [place] 09/04/2016, 12:47 PM Weekend Coverage [phone]

## 2016-09-04 NOTE — Progress Notes (Addendum)
Patient reports burning and pain with attempt to flush RFA PIV, IV d/c. Attempt (x1) LFA PIV insertion unsuccessful. Izora Gala, Agricultural consultant notified for f/u. Patient aware and verbalized agreement.

## 2016-09-04 NOTE — Progress Notes (Addendum)
Patient ID: Toni [name], female   DOB: 12/05/1932, 83 y.o.   MRN: [id number]  PROGRESS NOTE    Toni [name]  PYP:[id number] DOB: 01/06/1933 DOA: 08/31/2016  PCP: Elyn Peers, MD   Brief Narrative:  83 y.o. female with history of HTN who presented with altered mental status for past few days prior to this admission. Per family report, patient apparently had a cough 2 weeks ago, got a flu shot one week ago and that after that started to get very weak and confused. In ED, blood pressure was as low as 76/43. Tmax 103.90F, HR 57-106, RR 22-46. Blood work was notable for glucose of 336, lactic acid 6.29, white blood cell count 12.2 and then repeat count 39.1. Ethanol was within normal limits. Urinalysis showed positive nitrites and many bacteria. Urine and blood cultures were obtained, respiratory virus panel is pending. Chest x-ray showed left central airspace opacity. Patient was started on empiric vancomycin and Zosyn for sepsis thought to be due to UTI and possibly pneumonia. Further work up revealed strep bacteremia.   Assessment & Plan:   Principal Problem: Sepsis secondary to community acquired pneumonia and streptococcus bacteremia and E.Coli UTI / leukocytosis - Sepsis criteria met on the admission with fever, hypotension, tachycardia, tachypnea, lactic acidosis, leukocytosis. Procalcitonin elevated at 68.82 - Multiple sources of infection: strep bacteremia, E.Coli UTI (blood cx growing strep species, sens report pending. UCx growing E.Coli) - Stopped tamiflu 2/15 - Stopped zosyn, azithro and vanco and currently on rocephin - No vegetation on ECHO, normal EF - Repeat blood cx - no growth so far - Leukocytosis improving   Active Problems: Acute metabolic encephalopathy - Likely related to sepsis and multiple sources of infection - Much more alert   Hyperglycemia - Continue sliding scale insulin  Normocytic anemia  - Hemoglobin 8.1, down from 11.2 on the admission - Repeat hgb  stable    DVT prophylaxis:  Lovenox subQ  Code Status: DNR/DNI Family Communication: No family at the bedside Disposition Plan: Likely to SNF in am if blood cx growing strep has sens report back    Consultants:   Palliative care  CCM - signed off 2/15  Procedures:   None  Antimicrobials:   Vancomycin and Zosyn 08/31/2016 --> 2/15  Azithromycin 08/31/2016 --> 2/15  Rocephin 2/15 -->   Subjective: No overnight events.   Objective: Vitals:   09/03/16 1509 09/03/16 2009 09/04/16 0446 09/04/16 1347  BP: 119/69 123/70 125/81 127/62  Pulse: 91 85 91   Resp: 20 20 18 18   Temp: 98.6 F (37 C) 98.7 F (37.1 C) 98.1 F (36.7 C) 98.2 F (36.8 C)  TempSrc: Oral Oral Oral Oral  SpO2: 100% 100% 100% 98%  Weight: 87.9 kg (193 lb 12.6 oz)     Height:        Intake/Output Summary (Last 24 hours) at 09/04/16 1357 Last data filed at 09/04/16 1347  Gross per 24 hour  Intake          1791.25 ml  Output              250 ml  Net          1541.25 ml   Filed Weights   09/02/16 0500 09/03/16 0300 09/03/16 1509  Weight: 83.3 kg (183 lb 10.3 oz) 88.6 kg (195 lb 5.2 oz) 87.9 kg (193 lb 12.6 oz)    Examination:  General exam: no distress  Respiratory system: no wheezing, no rhonchi  Cardiovascular system: S1 &  S2 heard, Rate controlled  Gastrointestinal system: (+) BS, non tender abd  Central nervous system: Sleeping, wakes up easily when called name  Extremities:palpable pulses bilaterally  Skin: Skin is warm and dry  Psychiatry: normal mood and behavior, no agitation   Data Reviewed: I have personally reviewed following labs and imaging studies  CBC:  Recent Labs Lab 08/31/16 1648 09/01/16 0209 09/02/16 0323 09/03/16 0416 09/04/16 0734  WBC 12.2* 39.1* 31.8* 23.3* 15.0*  NEUTROABS 11.1*  --   --   --   --   HGB 11.2* 8.1* 8.8* 9.0* 9.4*  HCT 36.4 26.4* 28.6* 29.5* 30.4*  MCV 90.5 90.1 89.7 89.9 89.9  PLT 336 275 267 273 408   Basic Metabolic  Panel:  Recent Labs Lab 08/31/16 1648 09/01/16 0209 09/02/16 0323 09/03/16 0416 09/04/16 0734  NA 140  --  138 138 139  K 4.1  --  3.8 3.4* 4.0  CL 103  --  105 107 108  CO2 26  --  25 26 26   GLUCOSE 336*  --  185* 101* 143*  BUN 12  --  14 7 <5*  CREATININE 0.69 0.58 0.50 0.41* 0.37*  CALCIUM 9.1  --  8.0* 8.3* 8.4*   GFR: Estimated Creatinine Clearance: 56.8 mL/min (by C-G formula based on SCr of 0.37 mg/dL (L)). Liver Function Tests:  Recent Labs Lab 08/31/16 1648  AST 32  ALT 23  ALKPHOS 168*  BILITOT 1.6*  PROT 7.5  ALBUMIN 1.8*   No results for input(s): LIPASE, AMYLASE in the last 168 hours.  Recent Labs Lab 08/31/16 1648  AMMONIA 76*   Coagulation Profile:  Recent Labs Lab 08/31/16 1648  INR 1.33   Cardiac Enzymes: No results for input(s): CKTOTAL, CKMB, CKMBINDEX, TROPONINI in the last 168 hours. BNP (last 3 results) No results for input(s): PROBNP in the last 8760 hours. HbA1C: No results for input(s): HGBA1C in the last 72 hours. CBG:  Recent Labs Lab 09/03/16 1129 09/03/16 1656 09/03/16 2005 09/04/16 0736 09/04/16 1145  GLUCAP 132* 197* 211* 150* 211*   Lipid Profile: No results for input(s): CHOL, HDL, LDLCALC, TRIG, CHOLHDL, LDLDIRECT in the last 72 hours. Thyroid Function Tests: No results for input(s): TSH, T4TOTAL, FREET4, T3FREE, THYROIDAB in the last 72 hours. Anemia Panel: No results for input(s): VITAMINB12, FOLATE, FERRITIN, TIBC, IRON, RETICCTPCT in the last 72 hours. Urine analysis:    Component Value Date/Time   COLORURINE AMBER (A) 08/31/2016 1647   APPEARANCEUR HAZY (A) 08/31/2016 1647   LABSPEC 1.022 08/31/2016 1647   PHURINE 5.0 08/31/2016 1647   GLUCOSEU >=500 (A) 08/31/2016 1647   HGBUR MODERATE (A) 08/31/2016 1647   BILIRUBINUR NEGATIVE 08/31/2016 1647   KETONESUR NEGATIVE 08/31/2016 1647   PROTEINUR NEGATIVE 08/31/2016 1647   NITRITE POSITIVE (A) 08/31/2016 1647   LEUKOCYTESUR NEGATIVE 08/31/2016 1647    Sepsis Labs: @LABRCNTIP (procalcitonin:4,lacticidven:4)    C difficile quick scan w PCR reflex     Status: None   Collection Time: 08/31/16  4:53 PM  Result Value Ref Range Status   C Diff antigen NEGATIVE NEGATIVE Final   C Diff toxin NEGATIVE NEGATIVE Final   C Diff interpretation No C. difficile detected.  Final  MRSA PCR Screening     Status: None   Collection Time: 09/01/16  1:01 AM  Result Value Ref Range Status   MRSA by PCR NEGATIVE NEGATIVE Final      Radiology Studies: Ct Head Wo Contrast Result Date: 08/31/2016 Chronic microvascular ischemia without  acute intracranial abnormality. Electronically Signed   By: Ulyses Jarred M.D.   On: 08/31/2016 18:27   Dg Chest Portable 1 View Result Date: 08/31/2016 Lungs hypoexpanded. Mild patchy left central airspace opacities may reflect mild pneumonia, depending on the patient's symptoms. Electronically Signed   By: Garald Balding M.D.   On: 08/31/2016 17:14      Scheduled Meds: . cefTRIAXone (ROCEPHIN)  IV  2 g Intravenous Q24H  . enoxaparin (LOVENOX) injection  40 mg Subcutaneous Q24H  . insulin aspart  0-15 Units Subcutaneous TID WC  . insulin aspart  0-5 Units Subcutaneous QHS  . senna  1 tablet Oral BID   Continuous Infusions: . sodium chloride 75 mL/hr at 09/03/16 2200     LOS: 4 days    Time spent: 15 minutes  Greater than 50% of the time spent on counseling and coordinating the care.   Leisa Lenz, MD [facility] Pager [phone]  If 7PM-7AM, please contact night-coverage [email or web address] Password [email or web address] 09/04/2016, 1:57 PM

## 2016-09-05 LAB — GLUCOSE, CAPILLARY
GLUCOSE-CAPILLARY: 256 mg/dL — AB (ref 65–99)
GLUCOSE-CAPILLARY: 244 mg/dL — AB (ref 65–99)
Glucose-Capillary: 206 mg/dL — ABNORMAL HIGH (ref 65–99)
Glucose-Capillary: 107 mg/dL — ABNORMAL HIGH (ref 65–99)

## 2016-09-05 NOTE — Progress Notes (Addendum)
Patient ID: Toni [name], female   DOB: 21-Jan-1933, 83 y.o.   MRN: [id number]  PROGRESS NOTE    Toni [name]  ZHY:[id number] DOB: 06/07/1933 DOA: 08/31/2016  PCP: Elyn Peers, MD   Brief Narrative:  83 y.o. female with history of HTN who presented with altered mental status for past few days prior to this admission. Per family report, patient apparently had a cough 2 weeks ago, got a flu shot one week ago and that after that started to get very weak and confused. In ED, blood pressure was as low as 76/43. Tmax 103.21F, HR 57-106, RR 22-46. Blood work was notable for glucose of 336, lactic acid 6.29, white blood cell count 12.2 and then repeat count 39.1. Ethanol was within normal limits. Urinalysis showed positive nitrites and many bacteria. Urine and blood cultures were obtained, respiratory virus panel is pending. Chest x-ray showed left central airspace opacity. Patient was started on empiric vancomycin and Zosyn for sepsis thought to be due to UTI and possibly pneumonia. Further work up revealed strep bacteremia.   Assessment & Plan:   Principal Problem: Sepsis secondary to community acquired pneumonia and streptococcus F bacteremia and E.Coli UTI / leukocytosis - Sepsis criteria met on the admission with fever, hypotension, tachycardia, tachypnea, lactic acidosis, leukocytosis. Procalcitonin elevated at 68.82 - Multiple sources of infection: strep bacteremia F, E.Coli UTI (blood cx growing strep species, sens report pending. UCx growing E.Coli) - Stopped tamiflu 2/15 - Stopped zosyn, azithro and vanco and currently on rocephin - No vegetation on ECHO, normal EF - Repeat blood cx - no growth so far - Leukocytosis improving  - I spoke with Dr. Tommy Medal of ID who recommended evaluation with TEE and strep F is known to cause endocarditis so it would be necessary to rule it out with TEE; paged cardio to set up TEE for tomorrow - Keep NPO after midnight   Active Problems: Acute metabolic  encephalopathy - Likely related to sepsis and multiple sources of infection - Stable, AAOx3  Hyperglycemia - Continue sliding scale insulin - CBG's in past 24 hours: 182, 198, 206  Normocytic anemia  - Hemoglobin 8.1, down from 11.2 on the admission - Repeat hgb stable    DVT prophylaxis:  Lovenox subQ  Code Status: DNR/DNI Family Communication: No family at the bedside Disposition Plan: Needs TEE tomorrow   Consultants:   Palliative care  CCM - signed off 2/15  Cardio for TEE  Procedures:   None  Antimicrobials:   Vancomycin and Zosyn 08/31/2016 --> 2/15  Azithromycin 08/31/2016 --> 2/15  Rocephin 2/15 -->   Subjective: No overnight events.   Objective: Vitals:   09/04/16 1347 09/04/16 2026 09/05/16 0424 09/05/16 0425  BP: 127/62 127/76 (!) 119/102   Pulse:  88 87   Resp: _0 Temp: 98.2 F (36.8 C) 98.3 F (36.8 C) 97.8 F (36.6 C)   TempSrc: Oral Oral Oral   SpO2: 98% 96% 99%   Weight:    89.9 kg (198 lb 3.1 oz)  Height:        Intake/Output Summary (Last 24 hours) at 09/05/16 0956 Last data filed at 09/05/16 0700  Gross per 24 hour  Intake          2386.25 ml  Output                1 ml  Net          2385.25 ml   [employer]  09/03/16 0300 09/03/16 1509 09/05/16 0425  Weight: 88.6 kg (195 lb 5.2 oz) 87.9 kg (193 lb 12.6 oz) 89.9 kg (198 lb 3.1 oz)    Examination:  General exam: no distress, calm and comfortable  Respiratory system: no wheezing, no rhonchi  Cardiovascular system: S1 & S2 heard, RRR Gastrointestinal system: (+) BS, non tender abd, non distended  Central nervous system: Awake and alert  Extremities:  palpable pulses bilaterally  Skin: No lesions or ulcers  Psychiatry: normal mood and behavior, no agitation   Data Reviewed: I have personally reviewed following labs and imaging studies  CBC:  Recent Labs Lab 08/31/16 1648 09/01/16 0209 09/02/16 0323 09/03/16 0416 09/04/16 0734  WBC 12.2* 39.1* 31.8*  23.3* 15.0*  NEUTROABS 11.1*  --   --   --   --   HGB 11.2* 8.1* 8.8* 9.0* 9.4*  HCT 36.4 26.4* 28.6* 29.5* 30.4*  MCV 90.5 90.1 89.7 89.9 89.9  PLT 336 275 267 273 376   Basic Metabolic Panel:  Recent Labs Lab 08/31/16 1648 09/01/16 0209 09/02/16 0323 09/03/16 0416 09/04/16 0734  NA 140  --  138 138 139  K 4.1  --  3.8 3.4* 4.0  CL 103  --  105 107 108  CO2 26  --  _0 GLUCOSE 336*  --  185* 101* 143*  BUN 12  --  14 7 <5*  CREATININE 0.69 0.58 0.50 0.41* 0.37*  CALCIUM 9.1  --  8.0* 8.3* 8.4*   GFR: Estimated Creatinine Clearance: 57.5 mL/min (by C-G formula based on SCr of 0.37 mg/dL (L)). Liver Function Tests:  Recent Labs Lab 08/31/16 1648  AST 32  ALT 23  ALKPHOS 168*  BILITOT 1.6*  PROT 7.5  ALBUMIN 1.8*   No results for input(s): LIPASE, AMYLASE in the last 168 hours.  Recent Labs Lab 08/31/16 1648  AMMONIA 76*   Coagulation Profile:  Recent Labs Lab 08/31/16 1648  INR 1.33   Cardiac Enzymes: No results for input(s): CKTOTAL, CKMB, CKMBINDEX, TROPONINI in the last 168 hours. BNP (last 3 results) No results for input(s): PROBNP in the last 8760 hours. HbA1C: No results for input(s): HGBA1C in the last 72 hours. CBG:  Recent Labs Lab 09/04/16 0736 09/04/16 1145 09/04/16 1705 09/04/16 2154 09/05/16 0736  GLUCAP 150* 211* 182* 198* 206*   Lipid Profile: No results for input(s): CHOL, HDL, LDLCALC, TRIG, CHOLHDL, LDLDIRECT in the last 72 hours. Thyroid Function Tests: No results for input(s): TSH, T4TOTAL, FREET4, T3FREE, THYROIDAB in the last 72 hours. Anemia Panel: No results for input(s): VITAMINB12, FOLATE, FERRITIN, TIBC, IRON, RETICCTPCT in the last 72 hours. Urine analysis:    Component Value Date/Time   COLORURINE AMBER (A) 08/31/2016 1647   APPEARANCEUR HAZY (A) 08/31/2016 1647   LABSPEC 1.022 08/31/2016 1647   PHURINE 5.0 08/31/2016 1647   GLUCOSEU >=500 (A) 08/31/2016 1647   HGBUR MODERATE (A) 08/31/2016 1647    BILIRUBINUR NEGATIVE 08/31/2016 1647   KETONESUR NEGATIVE 08/31/2016 1647   PROTEINUR NEGATIVE 08/31/2016 1647   NITRITE POSITIVE (A) 08/31/2016 1647   LEUKOCYTESUR NEGATIVE 08/31/2016 1647   Sepsis Labs: _1 (procalcitonin:4,lacticidven:4)    C difficile quick scan w PCR reflex     Status: None   Collection Time: 08/31/16  4:53 PM  Result Value Ref Range Status   C Diff antigen NEGATIVE NEGATIVE Final   C Diff toxin NEGATIVE NEGATIVE Final   C Diff interpretation No C. difficile detected.  Final  MRSA PCR  Screening     Status: None   Collection Time: 09/01/16  1:01 AM  Result Value Ref Range Status   MRSA by PCR NEGATIVE NEGATIVE Final      Radiology Studies: Ct Head Wo Contrast Result Date: 08/31/2016 Chronic microvascular ischemia without acute intracranial abnormality. Electronically Signed   By: Ulyses Jarred M.D.   On: 08/31/2016 18:27   Dg Chest Portable 1 View Result Date: 08/31/2016 Lungs hypoexpanded. Mild patchy left central airspace opacities may reflect mild pneumonia, depending on the patient's symptoms. Electronically Signed   By: Garald Balding M.D.   On: 08/31/2016 17:14      Scheduled Meds: . cefTRIAXone (ROCEPHIN)  IV  2 g Intravenous Q24H  . enoxaparin (LOVENOX) injection  40 mg Subcutaneous Q24H  . insulin aspart  0-15 Units Subcutaneous TID WC  . insulin aspart  0-5 Units Subcutaneous QHS  . senna  1 tablet Oral BID   Continuous Infusions: . sodium chloride 75 mL/hr at 09/05/16 0041     LOS: 5 days    Time spent: 25 minutes  Greater than 50% of the time spent on counseling and coordinating the care.   Leisa Lenz, MD [facility] Pager [phone]  If 7PM-7AM, please contact night-coverage [email or web address] Password [facility] 09/05/2016, 9:56 AM

## 2016-09-05 NOTE — Progress Notes (Signed)
CSW provided pt with NH bed offers.  Pt will discuss offers with her daughter and then make her choice.  Unit CSW to facilitate NH tx as medically appropriate.  Creta Levin, LCSW Weekend Coverage [phone]

## 2016-09-05 NOTE — Progress Notes (Signed)
Patient moderate assist X 1 to [facility] due to urine incontinence. Linen changed and post incontinence hygiene provided, with barrier protective cream to buttocks. Additional urine output noted in BSC. Patient with moderate return assist X 1 to bed.

## 2016-09-05 NOTE — Progress Notes (Signed)
Izora Gala, Charge RN attempt (x1) PIV insertion unsuccessful. Patient tolerated well, no c/o voiced. IV team consult ordered.

## 2016-09-06 LAB — BASIC METABOLIC PANEL
Anion gap: 7 (ref 5–15)
CO2: 26 mmol/L (ref 22–32)
Calcium: 6.5 mg/dL — ABNORMAL LOW (ref 8.9–10.3)
Chloride: 107 mmol/L (ref 101–111)
Creatinine, Ser: 0.38 mg/dL — ABNORMAL LOW (ref 0.44–1.00)
GFR calc Af Amer: >60 mL/min (ref >60)
GLUCOSE: 91 mg/dL (ref 65–99)
Potassium: 5.3 mmol/L — ABNORMAL HIGH (ref 3.5–5.1)
Sodium: 140 mmol/L (ref 135–145)

## 2016-09-06 LAB — GLUCOSE, CAPILLARY
GLUCOSE-CAPILLARY: 250 mg/dL — AB (ref 65–99)
GLUCOSE-CAPILLARY: 231 mg/dL — AB (ref 65–99)
Glucose-Capillary: 107 mg/dL — ABNORMAL HIGH (ref 65–99)
Glucose-Capillary: 117 mg/dL — ABNORMAL HIGH (ref 65–99)

## 2016-09-06 LAB — CBC
HCT: 31.9 % — ABNORMAL LOW (ref 36.0–46.0)
Hemoglobin: 10 g/dL — ABNORMAL LOW (ref 12.0–15.0)
MCH: 28.3 pg (ref 26.0–34.0)
MCHC: 31.3 g/dL (ref 30.0–36.0)
MCV: 90.4 fL (ref 78.0–100.0)
PLATELETS: 229 10*3/uL (ref 150–400)
RBC: 3.53 MIL/uL — ABNORMAL LOW (ref 3.87–5.11)
RDW: 15.8 % — ABNORMAL HIGH (ref 11.5–15.5)
WBC: 9.1 10*3/uL (ref 4.0–10.5)

## 2016-09-06 NOTE — Progress Notes (Signed)
Patient transfer to [facility] with moderate assist X1 and wheeled walker. Dependent post toileting care provided and return transfer assist completed.

## 2016-09-06 NOTE — Progress Notes (Signed)
   Pt is scheduled for TEE for bacteremia on 09/07/16 with Dr. Oval Linsey. Pt was notified of plans including indication for procedure. She was also informed regarding the details of TEE including potential associated risk. Verbal consent was given. RN to obtain formal written consent. TEE orders have been placed. She will be NPO at midnight. All questions and concerns were addressed.  Toni [name] 09/06/2016.

## 2016-09-06 NOTE — Care Management Note (Signed)
Case Management Note  Patient Details  Name: ASJIA HARPIN MRN: [id number] Date of Birth: 1933/03/05  Subjective/Objective:                    Action/Plan:   Expected Discharge Date:                  Expected Discharge Plan:  Skilled Nursing Facility  In-House Referral:  Clinical Social Work  Discharge planning Services  CM Consult  Post Acute Care Choice:    Choice offered to:     DME Arranged:    DME Agency:     HH Arranged:    [place] Agency:     Status of Service:  In process, will continue to follow  If discussed at Long Length of Stay Meetings, dates discussed:    Additional Comments:  Marilu Favre, RN 09/06/2016, 1:35 PM

## 2016-09-06 NOTE — Progress Notes (Signed)
  Physical Therapy Treatment Patient Details Name: Toni [name] MRN: [id number] DOB: Sep 10, 1932 Today's Date: 09/06/2016    History of Present Illness Pt is an 83 y/o female admitted secondary to AMS, found to be septic secondary to a UTI. PMH including but not limited to DM and PVD.    PT Comments    Pt just got back into recliner after using the [facility] with RN tech and daughter's assistance and now she is too fatigued to work with PT.  She needed help getting repositioned in the recliner and did have some DOE,so PT checked vitals x 2 and helped her to reposition for better open lungs while seated (she was slouched down and did not have good lung expansion in this position).  She was agreeable for PT to come back tomorrow before her TEE scheduled (per RN before 1500) to work with her.   Follow Up Recommendations  SNF     Equipment Recommendations  None recommended by PT    Recommendations for Other Services   NA     Precautions / Restrictions Precautions Precautions: Fall Precaution Comments: pt reported multiple falls in the last six months with granddaughter present to confirm       Balance Overall balance assessment: Needs assistance Sitting-balance support: Feet supported;Bilateral upper extremity supported Sitting balance-Leahy Scale: Poor Sitting balance - Comments: min assist to pull up to sitting without back support from recliner chair. Mod assist with draw pad to help position hips back further in the chair.  Pt SOB/DOE at rest after just transferring back to recliner from [facility]. HR 108, O2 sats 94% on RA, HR after 5 mins 94bpm,O2 sat 96% on RA.                              Cognition Arousal/Alertness: Awake/alert Behavior During Therapy: WFL for tasks assessed/performed Overall Cognitive Status:  (not formally tested this session. )                             Pertinent Vitals/Pain Pain Assessment: No/denies pain           PT Goals  (current goals can now be found in the care plan section) Acute Rehab PT Goals Patient Stated Goal: go to rehab to get stronger PT Goal Formulation: With patient (fatiuged after transfer with RN tech) Progress towards PT goals: Not progressing toward goals - comment    Frequency    Min 2X/week      PT Plan Current plan remains appropriate       End of Session   Activity Tolerance: Patient limited by fatigue Patient left: in chair;with call bell/phone within reach;with family/visitor present   PT Visit Diagnosis: Muscle weakness (generalized) (M62.81);Difficulty in walking, not elsewhere classified (R26.2)     Time: [id number] PT Time Calculation (min) (ACUTE ONLY): 13 min  Charges:  $Therapeutic Activity: 8-22 mins                       Briella Hobday B. [place], [place], DPT [phone]   09/06/2016, 4:35 PM

## 2016-09-06 NOTE — Progress Notes (Addendum)
Patient ID: Toni [name], female   DOB: [birth year], 83 y.o.   MRN: [id number]  PROGRESS NOTE    Toni [name]  WUJ:[id number] DOB: 1933/06/21 DOA: 08/31/2016  PCP: Elyn Peers, MD   Brief Narrative:  83 y.o. female with history of HTN who presented with altered mental status for past few days prior to this admission. Per family report, patient apparently had a cough 2 weeks ago, got a flu shot one week ago and that after that started to get very weak and confused. In ED, blood pressure was as low as 76/43. Tmax 103.21F, HR 57-106, RR 22-46. Blood work was notable for glucose of 336, lactic acid 6.29, white blood cell count 12.2 and then repeat count 39.1. Ethanol was within normal limits. Urinalysis showed positive nitrites and many bacteria. Urine and blood cultures were obtained, respiratory virus panel is pending. Chest x-ray showed left central airspace opacity. Patient was started on empiric vancomycin and Zosyn for sepsis thought to be due to UTI and possibly pneumonia. Further work up revealed strep bacteremia.   Assessment & Plan:   Principal Problem: Sepsis secondary to community acquired pneumonia and streptococcus F bacteremia and E.Coli UTI / leukocytosis - Sepsis criteria met on the admission with fever, hypotension, tachycardia, tachypnea, lactic acidosis, leukocytosis. Procalcitonin elevated at 68.82 - Multiple sources of infection: strep bacteremia F, E.Coli UTI (blood cx growing strep species, sens report pending. UCx growing E.Coli) - Stopped tamiflu 2/15 - Stopped zosyn, azithro and vanco and currently on rocephin - No vegetation on ECHO, normal EF - Repeat blood cx - no growth so far - Leukocytosis resolved  - I spoke with Dr. Tommy Medal of ID who recommended evaluation with TEE as strep F is known to cause endocarditis so it would be necessary to rule it out with TEE - Cardiology to set up TEE, she is NPO in case it can be done today   Active Problems: Acute metabolic  encephalopathy - Likely related to sepsis and multiple sources of infection - Stable, AAOx3  Hyperglycemia - Continue sliding scale insulin  Normocytic anemia  - Hemoglobin 8.1, down from 11.2 on the admission - Repeat hgb stable at 10  Hyperkalemia - Mild, 5.3 - Will repeat potassium level today    DVT prophylaxis:  SCD's bilaterally  Code Status: DNR/DNI Family Communication: No family at the bedside Disposition Plan: Needs TEE, per cardio, she is NPO in case it can be done today    Consultants:   Palliative care  CCM - signed off 2/15  Cardio for TEE  Procedures:   None  Antimicrobials:   Vancomycin and Zosyn 08/31/2016 --> 2/15  Azithromycin 08/31/2016 --> 2/15  Rocephin 2/15 -->   Subjective: No overnight events.   Objective: Vitals:   09/05/16 0425 09/05/16 1342 09/05/16 2010 09/06/16 0453  BP:  (!) 141/78 129/72 132/74  Pulse:  86 85 84  Resp:  17 17 18   Temp:  98.1 F (36.7 C) 98.4 F (36.9 C) 98.3 F (36.8 C)  TempSrc:  Oral Oral Oral  SpO2:  99% 98% 99%  Weight: 89.9 kg (198 lb 3.1 oz)   90.1 kg (198 lb 10.2 oz)  Height:        Intake/Output Summary (Last 24 hours) at 09/06/16 0942 Last data filed at 09/06/16 0310  Gross per 24 hour  Intake              120 ml  Output  0 ml  Net              120 ml   Filed Weights   09/03/16 1509 09/05/16 0425 09/06/16 0453  Weight: 87.9 kg (193 lb 12.6 oz) 89.9 kg (198 lb 3.1 oz) 90.1 kg (198 lb 10.2 oz)    Examination:  General exam: calm and comfortable  Respiratory system: no wheezing, no rhonchi  Cardiovascular system: S1 & S2 heard, Rate controlled  Gastrointestinal system: (+) BS, non tender abd, non distended  Central nervous system: non focal  Extremities: (+) swelling, palpable pulses bilaterally  Skin: skin is warm and dry  Psychiatry: normal mood and behavior  Data Reviewed: I have personally reviewed following labs and imaging studies  CBC:  Recent Labs Lab  08/31/16 1648 09/01/16 0209 09/02/16 0323 09/03/16 0416 09/04/16 0734 09/06/16 0354  WBC 12.2* 39.1* 31.8* 23.3* 15.0* 9.1  NEUTROABS 11.1*  --   --   --   --   --   HGB 11.2* 8.1* 8.8* 9.0* 9.4* 10.0*  HCT 36.4 26.4* 28.6* 29.5* 30.4* 31.9*  MCV 90.5 90.1 89.7 89.9 89.9 90.4  PLT 336 275 267 273 263 462   Basic Metabolic Panel:  Recent Labs Lab 08/31/16 1648 09/01/16 0209 09/02/16 0323 09/03/16 0416 09/04/16 0734 09/06/16 0354  NA 140  --  138 138 139 140  K 4.1  --  3.8 3.4* 4.0 5.3*  CL 103  --  105 107 108 107  CO2 26  --  25 26 26 26   GLUCOSE 336*  --  185* 101* 143* 91  BUN 12  --  14 7 <5* <5*  CREATININE 0.69 0.58 0.50 0.41* 0.37* 0.38*  CALCIUM 9.1  --  8.0* 8.3* 8.4* 6.5*   GFR: Estimated Creatinine Clearance: 57.6 mL/min (by C-G formula based on SCr of 0.38 mg/dL (L)). Liver Function Tests:  Recent Labs Lab 08/31/16 1648  AST 32  ALT 23  ALKPHOS 168*  BILITOT 1.6*  PROT 7.5  ALBUMIN 1.8*   No results for input(s): LIPASE, AMYLASE in the last 168 hours.  Recent Labs Lab 08/31/16 1648  AMMONIA 76*   Coagulation Profile:  Recent Labs Lab 08/31/16 1648  INR 1.33   Cardiac Enzymes: No results for input(s): CKTOTAL, CKMB, CKMBINDEX, TROPONINI in the last 168 hours. BNP (last 3 results) No results for input(s): PROBNP in the last 8760 hours. HbA1C: No results for input(s): HGBA1C in the last 72 hours. CBG:  Recent Labs Lab 09/05/16 0736 09/05/16 1225 09/05/16 1624 09/05/16 2148 09/06/16 0806  GLUCAP 206* 256* 244* 107* 107*   Lipid Profile: No results for input(s): CHOL, HDL, LDLCALC, TRIG, CHOLHDL, LDLDIRECT in the last 72 hours. Thyroid Function Tests: No results for input(s): TSH, T4TOTAL, FREET4, T3FREE, THYROIDAB in the last 72 hours. Anemia Panel: No results for input(s): VITAMINB12, FOLATE, FERRITIN, TIBC, IRON, RETICCTPCT in the last 72 hours. Urine analysis:    Component Value Date/Time   COLORURINE AMBER (A)  08/31/2016 1647   APPEARANCEUR HAZY (A) 08/31/2016 1647   LABSPEC 1.022 08/31/2016 1647   PHURINE 5.0 08/31/2016 1647   GLUCOSEU >=500 (A) 08/31/2016 1647   HGBUR MODERATE (A) 08/31/2016 1647   BILIRUBINUR NEGATIVE 08/31/2016 1647   KETONESUR NEGATIVE 08/31/2016 1647   PROTEINUR NEGATIVE 08/31/2016 1647   NITRITE POSITIVE (A) 08/31/2016 1647   LEUKOCYTESUR NEGATIVE 08/31/2016 1647   Sepsis Labs: @LABRCNTIP (procalcitonin:4,lacticidven:4)    C difficile quick scan w PCR reflex     Status: None  Collection Time: 08/31/16  4:53 PM  Result Value Ref Range Status   C Diff antigen NEGATIVE NEGATIVE Final   C Diff toxin NEGATIVE NEGATIVE Final   C Diff interpretation No C. difficile detected.  Final  MRSA PCR Screening     Status: None   Collection Time: 09/01/16  1:01 AM  Result Value Ref Range Status   MRSA by PCR NEGATIVE NEGATIVE Final      Radiology Studies: Ct Head Wo Contrast Result Date: 08/31/2016 Chronic microvascular ischemia without acute intracranial abnormality. Electronically Signed   By: Ulyses Jarred M.D.   On: 08/31/2016 18:27   Dg Chest Portable 1 View Result Date: 08/31/2016 Lungs hypoexpanded. Mild patchy left central airspace opacities may reflect mild pneumonia, depending on the patient's symptoms. Electronically Signed   By: Garald Balding M.D.   On: 08/31/2016 17:14      Scheduled Meds: . cefTRIAXone (ROCEPHIN)  IV  2 g Intravenous Q24H  . enoxaparin (LOVENOX) injection  40 mg Subcutaneous Q24H  . insulin aspart  0-15 Units Subcutaneous TID WC  . insulin aspart  0-5 Units Subcutaneous QHS  . senna  1 tablet Oral BID   Continuous Infusions: . sodium chloride 75 mL/hr at 09/06/16 0419     LOS: 6 days    Time spent: 15 minutes  Greater than 50% of the time spent on counseling and coordinating the care.   Leisa Lenz, MD [facility] Pager [phone]  If 7PM-7AM, please contact night-coverage [email or web address] Password  [facility] 09/06/2016, 9:42 AM

## 2016-09-07 ENCOUNTER — Encounter (HOSPITAL_COMMUNITY): Payer: Self-pay | Admitting: *Deleted

## 2016-09-07 ENCOUNTER — Encounter (HOSPITAL_COMMUNITY)
Admission: EM | Disposition: A | Discharge status: Skilled Nursing Facility | Payer: Self-pay | Source: Home / Self Care | Attending: Internal Medicine

## 2016-09-07 ENCOUNTER — Inpatient Hospital Stay (HOSPITAL_COMMUNITY): Payer: Medicare Other

## 2016-09-07 DIAGNOSIS — I340 Nonrheumatic mitral (valve) insufficiency: Secondary | ICD-10-CM

## 2016-09-07 HISTORY — PX: TEE WITHOUT CARDIOVERSION: SHX5443

## 2016-09-07 LAB — BASIC METABOLIC PANEL
ANION GAP: 9 (ref 5–15)
Anion gap: 6 (ref 5–15)
CALCIUM: 8.3 mg/dL — AB (ref 8.9–10.3)
CHLORIDE: 105 mmol/L (ref 101–111)
CO2: 26 mmol/L (ref 22–32)
CO2: 27 mmol/L (ref 22–32)
CREATININE: 0.4 mg/dL — AB (ref 0.44–1.00)
Calcium: 8.4 mg/dL — ABNORMAL LOW (ref 8.9–10.3)
Chloride: 102 mmol/L (ref 101–111)
Creatinine, Ser: 0.41 mg/dL — ABNORMAL LOW (ref 0.44–1.00)
GFR calc Af Amer: >60 mL/min (ref >60)
GFR calc non Af Amer: >60 mL/min (ref >60)
GFR calc non Af Amer: >60 mL/min (ref >60)
GLUCOSE: 161 mg/dL — AB (ref 65–99)
Glucose, Bld: 172 mg/dL — ABNORMAL HIGH (ref 65–99)
POTASSIUM: 3.7 mmol/L (ref 3.5–5.1)
Potassium: 3.7 mmol/L (ref 3.5–5.1)
SODIUM: 137 mmol/L (ref 135–145)
Sodium: 138 mmol/L (ref 135–145)

## 2016-09-07 LAB — GLUCOSE, CAPILLARY
Glucose-Capillary: 150 mg/dL — ABNORMAL HIGH (ref 65–99)
Glucose-Capillary: 139 mg/dL — ABNORMAL HIGH (ref 65–99)
Glucose-Capillary: 144 mg/dL — ABNORMAL HIGH (ref 65–99)
Glucose-Capillary: 162 mg/dL — ABNORMAL HIGH (ref 65–99)

## 2016-09-07 LAB — CBC
HEMATOCRIT: 30.7 % — AB (ref 36.0–46.0)
Hemoglobin: 9.1 g/dL — ABNORMAL LOW (ref 12.0–15.0)
MCH: 26.9 pg (ref 26.0–34.0)
MCHC: 29.6 g/dL — ABNORMAL LOW (ref 30.0–36.0)
MCV: 90.8 fL (ref 78.0–100.0)
PLATELETS: 261 10*3/uL (ref 150–400)
RBC: 3.38 MIL/uL — ABNORMAL LOW (ref 3.87–5.11)
RDW: 15.7 % — ABNORMAL HIGH (ref 11.5–15.5)
WBC: 10.7 10*3/uL — AB (ref 4.0–10.5)

## 2016-09-07 LAB — PROTIME-INR
INR: 1.24
PROTHROMBIN TIME: 15.7 s — AB (ref 11.4–15.2)

## 2016-09-07 SURGERY — ECHOCARDIOGRAM, TRANSESOPHAGEAL
Anesthesia: Moderate Sedation

## 2016-09-07 MED ORDER — BUTAMBEN-TETRACAINE-BENZOCAINE 2-2-14 % EX AERO
INHALATION_SPRAY | CUTANEOUS | Status: DC | PRN
  Administered 2016-09-07: 2 via TOPICAL

## 2016-09-07 MED ORDER — FENTANYL CITRATE (PF) 100 MCG/2ML IJ SOLN
INTRAMUSCULAR | Status: AC
  Filled 2016-09-07: qty 2

## 2016-09-07 MED ORDER — SODIUM CHLORIDE 0.9 % IV SOLN: INTRAVENOUS | Status: DC

## 2016-09-07 MED ORDER — FENTANYL CITRATE (PF) 100 MCG/2ML IJ SOLN
INTRAMUSCULAR | Status: DC | PRN
  Administered 2016-09-07 (×3): 12.5 ug via INTRAVENOUS

## 2016-09-07 MED ORDER — MIDAZOLAM HCL 5 MG/ML IJ SOLN
INTRAMUSCULAR | Status: AC
  Filled 2016-09-07: qty 2

## 2016-09-07 MED ORDER — MIDAZOLAM HCL 10 MG/2ML IJ SOLN
INTRAMUSCULAR | Status: DC | PRN
  Administered 2016-09-07 (×3): 1 mg via INTRAVENOUS

## 2016-09-07 NOTE — Progress Notes (Signed)
Physical Therapy Treatment Patient Details Name: YANEXI DOUGAL MRN: [id number] DOB: 1932/12/01 Today's Date: 09/07/2016    History of Present Illness Pt is an 83 y/o female admitted secondary to AMS, found to be septic secondary to a UTI. PMH including but not limited to DM and PVD.    PT Comments    Patient c/o 10/10 pain in Lt shoulder limiting session.  Patient agreed to perform LE exercises in bed.  RN notified and to room.  Follow Up Recommendations  SNF     Equipment Recommendations  None recommended by PT    Recommendations for Other Services       Precautions / Restrictions Precautions Precautions: Fall Precaution Comments: pt reported multiple falls in the last six months  Restrictions Weight Bearing Restrictions: No    Mobility  Bed Mobility               General bed mobility comments: Patient declined mobility - pain in Lt shoulder, and awaiting test  Transfers                    Ambulation/Gait                 Stairs            Wheelchair Mobility    Modified Rankin (Stroke Patients Only)       Balance                                    Cognition Arousal/Alertness: Awake/alert Behavior During Therapy: WFL for tasks assessed/performed Overall Cognitive Status: Impaired/Different from baseline Area of Impairment: Memory;Problem solving;Safety/judgement     Memory: Decreased short-term memory   Safety/Judgement: Decreased awareness of safety   Problem Solving: Slow processing;Decreased initiation;Difficulty sequencing;Requires verbal cues;Requires tactile cues      Exercises General Exercises - Lower Extremity Ankle Circles/Pumps: AROM;Both;10 reps;Supine Short Arc Quad: AROM;Both;10 reps;Supine Heel Slides: AROM;Both;10 reps;Supine Hip ABduction/ADduction: AROM;Both;10 reps;Supine    General Comments        Pertinent Vitals/Pain Pain Assessment: 0-10 Pain Score: 10-Worst pain  ever Pain Location: Lt shoulder Pain Descriptors / Indicators: Aching;Constant;Grimacing;Moaning Pain Intervention(s): Limited activity within patient's tolerance;Monitored during session;Repositioned;Patient requesting pain meds-RN notified    Home Living                      Prior Function            PT Goals (current goals can now be found in the care plan section) Acute Rehab PT Goals Patient Stated Goal: go to rehab to get stronger Progress towards PT goals: Not progressing toward goals - comment (Pain limiting session today)    Frequency    Min 2X/week      PT Plan Current plan remains appropriate    Co-evaluation             End of Session   Activity Tolerance: Patient limited by pain Patient left: in bed;with call bell/phone within reach;with nursing/sitter in room Nurse Communication: Mobility status;Patient requests pain meds (Pain Lt shoulder near IV site) PT Visit Diagnosis: Muscle weakness (generalized) (M62.81);Difficulty in walking, not elsewhere classified (R26.2)     Time: [id number] PT Time Calculation (min) (ACUTE ONLY): 9 min  Charges:  $Therapeutic Exercise: 8-22 mins                    G Codes:  Despina Pole 09/07/2016, 1:14 PM Carita Pian Sanjuana Kava, [place] Pager [phone]

## 2016-09-07 NOTE — Progress Notes (Addendum)
Patient ID: Toni [name], female   DOB: [birth year], 83 y.o.   MRN: [id number]  PROGRESS NOTE    PHILOMINA LEON  [id number]:[id number] DOB: [birth year] DOA: 08/31/2016  PCP: Elyn Peers, MD   Brief Narrative:  83 y.o. female with history of HTN who presented with altered mental status for past few days prior to this admission. Per family report, patient apparently had a cough 2 weeks ago, got a flu shot one week ago and that after that started to get very weak and confused. In ED, blood pressure was as low as 76/43. Tmax 103.68F, HR 57-106, RR 22-46. Blood work was notable for glucose of 336, lactic acid 6.29, white blood cell count 12.2 and then repeat count 39.1. Ethanol was within normal limits. Urinalysis showed positive nitrites and many bacteria. Urine and blood cultures were obtained, respiratory virus panel is pending. Chest x-ray showed left central airspace opacity. Patient was started on empiric vancomycin and Zosyn for sepsis thought to be due to UTI and possibly pneumonia. Further work up revealed strep bacteremia. Per ID recommendations, TEE scheduled for this am.  Assessment & Plan:   Principal Problem: Sepsis secondary to community acquired pneumonia and streptococcus F bacteremia and E.Coli UTI / leukocytosis - Sepsis criteria met on the admission with fever, hypotension, tachycardia, tachypnea, lactic acidosis, leukocytosis. Procalcitonin elevated at 68.82 - Multiple sources of infection: strep bacteremia F, E.Coli UTI (blood cx growing strep species, sens report pending. UCx growing E.Coli) - Repeat blood cx - no growth so far - Stopped tamiflu 2/15 - Stopped zosyn, azithro and vanco and currently on rocephin - No vegetation on ECHO, normal EF - WBC count has improved since admission but slightly up this am; no fevers  - I spoke with Dr. Tommy Medal of ID who recommended evaluation with TEE as strep F is known to cause endocarditis so it would be necessary to rule it out with TEE -  Cardiology to set up TEE, plan for TEE this am  Active Problems: Acute metabolic encephalopathy - Likely related to sepsis and multiple sources of infection - Stable, AAOx3  Hyperglycemia - Continue sliding scale insulin  Normocytic anemia  - Hemoglobin 8.1, down from 11.2 on the admission - Hgb 9.1, overall stable   Hyperkalemia - Mild, 5.3 on 2/19 but WNL this am   DVT prophylaxis:  SCD's bilaterally  Code Status: DNR/DNI Family Communication: No family at the bedside Disposition Plan: Needs TEE, scheduled for today    Consultants:   Palliative care  CCM - signed off 2/15  Cardio for TEE  ID - Dr. Tommy Medal, just phone call, no official consult unless vegetation on TEE seen then will have to consult them   Procedures:   None  Antimicrobials:   Vancomycin and Zosyn 08/31/2016 --> 2/15  Azithromycin 08/31/2016 --> 2/15  Rocephin 2/15 -->   Subjective: No overnight events.   Objective: Vitals:   09/06/16 1635 09/06/16 2032 09/07/16 0145 09/07/16 0457  BP:  119/63  132/62  Pulse: (!) 108 (!) 103  85  Resp:  18  18  Temp:  99.3 F (37.4 C)  97.7 F (36.5 C)  TempSrc:  Oral  Oral  SpO2: 94% 97%  99%  Weight:   90.3 kg (199 lb 1.2 oz)   Height:        Intake/Output Summary (Last 24 hours) at 09/07/16 1005 Last data filed at 09/07/16 0300  Gross per 24 hour  Intake  3980 ml  Output                0 ml  Net             3980 ml   Filed Weights   09/05/16 0425 09/06/16 0453 09/07/16 0145  Weight: 89.9 kg (198 lb 3.1 oz) 90.1 kg (198 lb 10.2 oz) 90.3 kg (199 lb 1.2 oz)    Examination:  General exam: calm and comfortable, no acute distress  Respiratory system: no wheezing, no rhonchi  Cardiovascular system: S1 & S2 heard, Rate controlled  Gastrointestinal system: (+) BS, no distention  Central nervous system: no focal deficits  Extremities: (+) swelling, palpable pulses bilaterally  Skin: no lesions, no ulcers   Psychiatry: normal  mood and behavior  Data Reviewed: I have personally reviewed following labs and imaging studies  CBC:  Recent Labs Lab 08/31/16 1648  09/02/16 0323 09/03/16 0416 09/04/16 0734 09/06/16 0354 09/07/16 0439  WBC 12.2*  < > 31.8* 23.3* 15.0* 9.1 10.7*  NEUTROABS 11.1*  --   --   --   --   --   --   HGB 11.2*  < > 8.8* 9.0* 9.4* 10.0* 9.1*  HCT 36.4  < > 28.6* 29.5* 30.4* 31.9* 30.7*  MCV 90.5  < > 89.7 89.9 89.9 90.4 90.8  PLT 336  < > 267 273 263 229 261  < > = values in this interval not displayed. Basic Metabolic Panel:  Recent Labs Lab 09/03/16 0416 09/04/16 0734 09/06/16 0354 09/07/16 0439 09/07/16 0743  NA 138 139 140 137 138  K 3.4* 4.0 5.3* 3.7 3.7  CL 107 108 107 105 102  CO2 26 26 26 26 27   GLUCOSE 101* 143* 91 172* 161*  BUN 7 <5* <5* <5* <5*  CREATININE 0.41* 0.37* 0.38* 0.40* 0.41*  CALCIUM 8.3* 8.4* 6.5* 8.3* 8.4*   GFR: Estimated Creatinine Clearance: 57.6 mL/min (by C-G formula based on SCr of 0.41 mg/dL (L)). Liver Function Tests:  Recent Labs Lab 08/31/16 1648  AST 32  ALT 23  ALKPHOS 168*  BILITOT 1.6*  PROT 7.5  ALBUMIN 1.8*   No results for input(s): LIPASE, AMYLASE in the last 168 hours.  Recent Labs Lab 08/31/16 1648  AMMONIA 76*   Coagulation Profile:  Recent Labs Lab 08/31/16 1648 09/07/16 0743  INR 1.33 1.24   Cardiac Enzymes: No results for input(s): CKTOTAL, CKMB, CKMBINDEX, TROPONINI in the last 168 hours. BNP (last 3 results) No results for input(s): PROBNP in the last 8760 hours. HbA1C: No results for input(s): HGBA1C in the last 72 hours. CBG:  Recent Labs Lab 09/06/16 0806 09/06/16 1202 09/06/16 1654 09/06/16 2051 09/07/16 0809  GLUCAP 107* 117* 250* 231* 150*   Lipid Profile: No results for input(s): CHOL, HDL, LDLCALC, TRIG, CHOLHDL, LDLDIRECT in the last 72 hours. Thyroid Function Tests: No results for input(s): TSH, T4TOTAL, FREET4, T3FREE, THYROIDAB in the last 72 hours. Anemia Panel: No  results for input(s): VITAMINB12, FOLATE, FERRITIN, TIBC, IRON, RETICCTPCT in the last 72 hours. Urine analysis:    Component Value Date/Time   COLORURINE AMBER (A) 08/31/2016 1647   APPEARANCEUR HAZY (A) 08/31/2016 1647   LABSPEC 1.022 08/31/2016 1647   PHURINE 5.0 08/31/2016 1647   GLUCOSEU >=500 (A) 08/31/2016 1647   HGBUR MODERATE (A) 08/31/2016 1647   BILIRUBINUR NEGATIVE 08/31/2016 1647   KETONESUR NEGATIVE 08/31/2016 1647   PROTEINUR NEGATIVE 08/31/2016 1647   NITRITE POSITIVE (A) 08/31/2016 1647   LEUKOCYTESUR  NEGATIVE 08/31/2016 1647   Sepsis Labs: @LABRCNTIP (procalcitonin:4,lacticidven:4)    C difficile quick scan w PCR reflex     Status: None   Collection Time: 08/31/16  4:53 PM  Result Value Ref Range Status   C Diff antigen NEGATIVE NEGATIVE Final   C Diff toxin NEGATIVE NEGATIVE Final   C Diff interpretation No C. difficile detected.  Final  MRSA PCR Screening     Status: None   Collection Time: 09/01/16  1:01 AM  Result Value Ref Range Status   MRSA by PCR NEGATIVE NEGATIVE Final      Radiology Studies: Ct Head Wo Contrast Result Date: 08/31/2016 Chronic microvascular ischemia without acute intracranial abnormality. Electronically Signed   By: Ulyses Jarred M.D.   On: 08/31/2016 18:27   Dg Chest Portable 1 View Result Date: 08/31/2016 Lungs hypoexpanded. Mild patchy left central airspace opacities may reflect mild pneumonia, depending on the patient's symptoms. Electronically Signed   By: Garald Balding M.D.   On: 08/31/2016 17:14      Scheduled Meds: . cefTRIAXone (ROCEPHIN)  IV  2 g Intravenous Q24H  . insulin aspart  0-15 Units Subcutaneous TID WC  . insulin aspart  0-5 Units Subcutaneous QHS  . senna  1 tablet Oral BID   Continuous Infusions: . sodium chloride 75 mL/hr at 09/06/16 1817     LOS: 7 days    Time spent: 15 minutes  Greater than 50% of the time spent on counseling and coordinating the care.   Leisa Lenz, MD [facility] Pager [phone]  If 7PM-7AM, please contact night-coverage [email or web address] Password [email or web address] 09/07/2016, 10:05 AM

## 2016-09-07 NOTE — H&P (Signed)
Patient ID: Toni [name] MRN: [id number] DOB/AGE: [birth year] 83 y.o.  Admit date: 08/31/2016  Chief Complaint    bacteremia   HPI: Toni [name] is an 17 F with diabetes and PVD here with weakness and confusion.  Found to be septic with a urinary source and possible pneumonia.  Also noted to have Strep bacteremia.  Cardiology was consulted for TEE.   Past Medical History:  Diagnosis Date  . Diabetes mellitus without complication ([place])   . Peripheral vascular disease ([place])   . Plantar fascial fibromatosis     Medications Prior to Admission  Medication Sig Dispense Refill  . acetaminophen (TYLENOL) 325 MG tablet Take 325-650 mg by mouth every 6 (six) hours as needed (for knee pain).    Marland Kitchen aspirin 81 MG tablet Take 81 mg by mouth daily.    . Multiple Vitamins-Minerals (CENTRUM SILVER ADULT 50+ PO) Take 1 tablet by mouth daily.    Vladimir Faster Glycol-Propyl Glycol (SYSTANE ULTRA OP) Apply 1-2 drops to eye 2 (two) times daily as needed (for dry eyes).     . simvastatin (ZOCOR) 40 MG tablet Take 40 mg by mouth daily.    . verapamil (VERELAN PM) 180 MG 24 hr capsule Take 180 mg by mouth daily.       No Known Allergies  Social History   Social History  . Marital status: Married    Spouse name: N/A  . Number of children: N/A  . Years of education: N/A   Occupational History  . Not on file.   Social History Main Topics  . Smoking status: Never Smoker  . Smokeless tobacco: Never Used  . Alcohol use No  . Drug use: No  . Sexual activity: Not on file   Other Topics Concern  . Not on file   Social History Narrative  . No narrative on file    Family History  Problem Relation Age of Onset  . Hypertension Mother   . Hyperlipidemia Mother     before age 20  . Heart disease Father   . Hypertension Father   . Hyperlipidemia Father     before age 34  . Diabetes Sister   . Other Sister     amputation    PHYSICAL EXAM: Vitals:   09/07/16 0457 09/07/16 1339  BP:  132/62 (!) 166/81  Pulse: 85 95  Resp: 18 20  Temp: 97.7 F (36.5 C) 98 F (36.7 C)     Results for orders placed or performed during the hospital encounter of 08/31/16 (from the past 24 hour(s))  Glucose, capillary     Status: Abnormal   Collection Time: 09/06/16  4:54 PM  Result Value Ref Range   Glucose-Capillary 250 (H) 65 - 99 mg/dL   Comment 1 Notify RN   Glucose, capillary     Status: Abnormal   Collection Time: 09/06/16  8:51 PM  Result Value Ref Range   Glucose-Capillary 231 (H) 65 - 99 mg/dL  CBC     Status: Abnormal   Collection Time: 09/07/16  4:39 AM  Result Value Ref Range   WBC 10.7 (H) 4.0 - 10.5 K/uL   RBC 3.38 (L) 3.87 - 5.11 MIL/uL   Hemoglobin 9.1 (L) 12.0 - 15.0 g/dL   HCT 30.7 (L) 36.0 - 46.0 %   MCV 90.8 78.0 - 100.0 fL   MCH 26.9 26.0 - 34.0 pg   MCHC 29.6 (L) 30.0 - 36.0 g/dL   RDW 15.7 (H) 11.5 -  15.5 %   Platelets 261 150 - 400 K/uL  Basic metabolic panel     Status: Abnormal   Collection Time: 09/07/16  4:39 AM  Result Value Ref Range   Sodium 137 135 - 145 mmol/L   Potassium 3.7 3.5 - 5.1 mmol/L   Chloride 105 101 - 111 mmol/L   CO2 26 22 - 32 mmol/L   Glucose, Bld 172 (H) 65 - 99 mg/dL   BUN <5 (L) 6 - 20 mg/dL   Creatinine, Ser 0.40 (L) 0.44 - 1.00 mg/dL   Calcium 8.3 (L) 8.9 - 10.3 mg/dL   GFR calc non Af Amer >60 >60 mL/min   GFR calc Af Amer >60 >60 mL/min   Anion gap 6 5 - 15  Protime-INR     Status: Abnormal   Collection Time: 09/07/16  7:43 AM  Result Value Ref Range   Prothrombin Time 15.7 (H) 11.4 - 15.2 seconds   INR 0000000   Basic metabolic panel     Status: Abnormal   Collection Time: 09/07/16  7:43 AM  Result Value Ref Range   Sodium 138 135 - 145 mmol/L   Potassium 3.7 3.5 - 5.1 mmol/L   Chloride 102 101 - 111 mmol/L   CO2 27 22 - 32 mmol/L   Glucose, Bld 161 (H) 65 - 99 mg/dL   BUN <5 (L) 6 - 20 mg/dL   Creatinine, Ser 0.41 (L) 0.44 - 1.00 mg/dL   Calcium 8.4 (L) 8.9 - 10.3 mg/dL   GFR calc non Af Amer >60 >60  mL/min   GFR calc Af Amer >60 >60 mL/min   Anion gap 9 5 - 15  Glucose, capillary     Status: Abnormal   Collection Time: 09/07/16  8:09 AM  Result Value Ref Range   Glucose-Capillary 150 (H) 65 - 99 mg/dL  Glucose, capillary     Status: Abnormal   Collection Time: 09/07/16 11:58 AM  Result Value Ref Range   Glucose-Capillary 139 (H) 65 - 99 mg/dL   No results found.       ASSESSMENT/PLAN:  Toni [name] is a 83 y.o. female who has presented today for surgery, with the diagnosis of bactermia. The various methods of treatment have been discussed with the patient and family. After consideration of risks, benefits and other options for treatment, the patient has consented to Procedure(s): TRANSESOPHAGEAL ECHOCARDIOGRAM (TEE) (N/A) as a surgical intervention . The patient's history has been reviewed, patient examined, no change in status, stable for surgery. I have reviewed the patient's chart and labs. Questions were answered to the patient's satisfaction.   Raydel Hosick C. Oval Linsey, MD, [facility]  09/07/2016 2:24 PM

## 2016-09-07 NOTE — CV Procedure (Signed)
Brief TEE Note  LVEF 60% Mild MR, trivial PR Moderate TR Mitral annulus calcification noted No vegetations or thrombus noted  During this procedure the patient is administered a total of Versed 4 mg and Fentanyl 50 mcg to achieve and maintain moderate conscious sedation.  The patient's heart rate, blood pressure, and oxygen saturation are monitored continuously during the procedure. The period of 17 conscious sedation is  minutes, of which I was present face-to-face 100% of this time.  For further details see full report   Refoel Palladino C. Oval Linsey, MD, [facility] 09/07/2016 2:54 PM

## 2016-09-08 ENCOUNTER — Inpatient Hospital Stay (HOSPITAL_COMMUNITY): Payer: Medicare Other

## 2016-09-08 LAB — CULTURE, BLOOD (ROUTINE X 2)
Culture: NO GROWTH
Culture: NO GROWTH

## 2016-09-08 LAB — CBC
HEMATOCRIT: 30.4 % — AB (ref 36.0–46.0)
HEMOGLOBIN: 9 g/dL — AB (ref 12.0–15.0)
MCH: 27.2 pg (ref 26.0–34.0)
MCHC: 29.6 g/dL — ABNORMAL LOW (ref 30.0–36.0)
MCV: 91.8 fL (ref 78.0–100.0)
Platelets: 270 10*3/uL (ref 150–400)
RBC: 3.31 MIL/uL — ABNORMAL LOW (ref 3.87–5.11)
RDW: 15.9 % — ABNORMAL HIGH (ref 11.5–15.5)
WBC: 7.2 10*3/uL (ref 4.0–10.5)

## 2016-09-08 LAB — GLUCOSE, CAPILLARY
GLUCOSE-CAPILLARY: 164 mg/dL — AB (ref 65–99)
Glucose-Capillary: 138 mg/dL — ABNORMAL HIGH (ref 65–99)
Glucose-Capillary: 168 mg/dL — ABNORMAL HIGH (ref 65–99)
Glucose-Capillary: 229 mg/dL — ABNORMAL HIGH (ref 65–99)

## 2016-09-08 MED ORDER — FUROSEMIDE 10 MG/ML IJ SOLN
20.0000 mg | Freq: Once | INTRAMUSCULAR | Status: AC
  Administered 2016-09-08: 20 mg via INTRAVENOUS
  Filled 2016-09-08: qty 2

## 2016-09-08 NOTE — Progress Notes (Signed)
Per MD, get pt. Up to chair and see if she gets SOB . Text page MD with results. I got pt. Up to chair with walker and she did get SOB that resolved once she sat back down in chair. MD paged.

## 2016-09-08 NOTE — Progress Notes (Addendum)
Patient ID: Toni [name], female   DOB: 27-Nov-1932, 83 y.o.   MRN: [id number]  PROGRESS NOTE    Toni [name]  [id number] DOB: 04-24-1933 DOA: 08/31/2016  PCP: Elyn Peers, MD   Brief Narrative:  83 y.o. female with history of HTN who presented with altered mental status for past few days prior to this admission. Per family report, patient apparently had a cough 2 weeks ago, got a flu shot one week ago and that after that started to get very weak and confused. In ED, blood pressure was as low as 76/43. Tmax 103.33F, HR 57-106, RR 22-46. Blood work was notable for glucose of 336, lactic acid 6.29, white blood cell count 12.2 and then repeat count 39.1. Ethanol was within normal limits. Urinalysis showed positive nitrites and many bacteria. Urine and blood cultures were obtained, respiratory virus panel is pending. Chest x-ray showed left central airspace opacity. Patient was started on empiric vancomycin and Zosyn for sepsis thought to be due to UTI and possibly pneumonia. Further work up revealed strep bacteremia.   Assessment & Plan:   Principal Problem: Sepsis secondary to community acquired pneumonia and streptococcus F bacteremia and E.Coli UTI / leukocytosis - Sepsis criteria met on the admission with fever, hypotension, tachycardia, tachypnea, lactic acidosis, leukocytosis. Procalcitonin elevated at 68.82 - Multiple sources of infection: strep bacteremia F, E.Coli UTI (blood cx growing strep species, sens report pending. UCx growing E.Coli) - Repeat blood cx - no growth so far - Stopped tamiflu 2/15 - Stopped zosyn, azithro and vanco and currently on rocephin - No vegetation on ECHO, normal EF - No vegetation or mass on TEE - Repeat CXR today  Active Problems: Acute metabolic encephalopathy - Likely related to sepsis and multiple sources of infection - Stable, AAOx3  Hyperglycemia - Continue sliding scale insulin - CBG's in past 24 hours: 138, 164, 168  Normocytic  anemia  - Hemoglobin 8.1, down from 11.2 on the admission - Hgb stable   Hyperkalemia - Mild, 5.3 on 2/19  - Repeat potassium WNL   DVT prophylaxis:  SCD's bilaterally  Code Status: DNR/DNI Family Communication: No family at the bedside; spoke with her daughter over the phone this am Disposition Plan: to SNF in am if no shortness of breath    Consultants:   Palliative care  CCM - signed off 2/15  Cardio for TEE  ID - Dr. Tommy Medal, just phone call, no official consult unless vegetation on TEE seen then will have to consult them   Procedures:   None  Antimicrobials:   Vancomycin and Zosyn 08/31/2016 --> 2/15  Azithromycin 08/31/2016 --> 2/15  Rocephin 2/15 -->   Subjective: No overnight events.   Objective: Vitals:   09/07/16 2209 09/07/16 2357 09/08/16 0410 09/08/16 1430  BP: 128/68  (!) 126/54 124/74  Pulse: 92  93 93  Resp: _0 Temp: 97.8 F (36.6 C)  98.1 F (36.7 C) 98.9 F (37.2 C)  TempSrc: Oral  Oral Oral  SpO2: 99%  98% 100%  Weight:  94.8 kg (208 lb 15.9 oz)    Height:        Intake/Output Summary (Last 24 hours) at 09/08/16 1653 Last data filed at 09/08/16 1432  Gross per 24 hour  Intake             1760 ml  Output                0 ml  Net  1760 ml   Filed Weights   09/06/16 0453 09/07/16 0145 09/07/16 2357  Weight: 90.1 kg (198 lb 10.2 oz) 90.3 kg (199 lb 1.2 oz) 94.8 kg (208 lb 15.9 oz)    Examination:  General exam: no acute distress  Respiratory system: no wheezing, no rhonchi  Cardiovascular system: S1 & S2 heard, RRR Gastrointestinal system: (+) BS, no distention, no tenderness  Central nervous system: no focal deficits  Extremities: (+) swelling, palpable pulses  Skin: skin is warm and dry  Psychiatry: normal mood and behavior, no agitation   Data Reviewed: I have personally reviewed following labs and imaging studies  CBC:  Recent Labs Lab 09/03/16 0416 09/04/16 0734 09/06/16 0354 09/07/16 0439  09/08/16 0508  WBC 23.3* 15.0* 9.1 10.7* 7.2  HGB 9.0* 9.4* 10.0* 9.1* 9.0*  HCT 29.5* 30.4* 31.9* 30.7* 30.4*  MCV 89.9 89.9 90.4 90.8 91.8  PLT 273 263 229 261 161   Basic Metabolic Panel:  Recent Labs Lab 09/03/16 0416 09/04/16 0734 09/06/16 0354 09/07/16 0439 09/07/16 0743  NA 138 139 140 137 138  K 3.4* 4.0 5.3* 3.7 3.7  CL 107 108 107 105 102  CO2 _0 GLUCOSE 101* 143* 91 172* 161*  BUN 7 <5* <5* <5* <5*  CREATININE 0.41* 0.37* 0.38* 0.40* 0.41*  CALCIUM 8.3* 8.4* 6.5* 8.3* 8.4*   GFR: Estimated Creatinine Clearance: 59.2 mL/min (by C-G formula based on SCr of 0.41 mg/dL (L)). Liver Function Tests: No results for input(s): AST, ALT, ALKPHOS, BILITOT, PROT, ALBUMIN in the last 168 hours. No results for input(s): LIPASE, AMYLASE in the last 168 hours. No results for input(s): AMMONIA in the last 168 hours. Coagulation Profile:  Recent Labs Lab 09/07/16 0743  INR 1.24   Cardiac Enzymes: No results for input(s): CKTOTAL, CKMB, CKMBINDEX, TROPONINI in the last 168 hours. BNP (last 3 results) No results for input(s): PROBNP in the last 8760 hours. HbA1C: No results for input(s): HGBA1C in the last 72 hours. CBG:  Recent Labs Lab 09/07/16 1626 09/07/16 1959 09/08/16 0757 09/08/16 1214 09/08/16 1624  GLUCAP 144* 162* 138* 164* 168*   Lipid Profile: No results for input(s): CHOL, HDL, LDLCALC, TRIG, CHOLHDL, LDLDIRECT in the last 72 hours. Thyroid Function Tests: No results for input(s): TSH, T4TOTAL, FREET4, T3FREE, THYROIDAB in the last 72 hours. Anemia Panel: No results for input(s): VITAMINB12, FOLATE, FERRITIN, TIBC, IRON, RETICCTPCT in the last 72 hours. Urine analysis:    Component Value Date/Time   COLORURINE AMBER (A) 08/31/2016 1647   APPEARANCEUR HAZY (A) 08/31/2016 1647   LABSPEC 1.022 08/31/2016 1647   PHURINE 5.0 08/31/2016 1647   GLUCOSEU >=500 (A) 08/31/2016 1647   HGBUR MODERATE (A) 08/31/2016 1647   BILIRUBINUR NEGATIVE  08/31/2016 1647   KETONESUR NEGATIVE 08/31/2016 1647   PROTEINUR NEGATIVE 08/31/2016 1647   NITRITE POSITIVE (A) 08/31/2016 1647   LEUKOCYTESUR NEGATIVE 08/31/2016 1647   Sepsis Labs: _1 (procalcitonin:4,lacticidven:4)    C difficile quick scan w PCR reflex     Status: None   Collection Time: 08/31/16  4:53 PM  Result Value Ref Range Status   C Diff antigen NEGATIVE NEGATIVE Final   C Diff toxin NEGATIVE NEGATIVE Final   C Diff interpretation No C. difficile detected.  Final  MRSA PCR Screening     Status: None   Collection Time: 09/01/16  1:01 AM  Result Value Ref Range Status   MRSA by PCR NEGATIVE NEGATIVE Final      Radiology  Studies: Ct Head Wo Contrast Result Date: 08/31/2016 Chronic microvascular ischemia without acute intracranial abnormality. Electronically Signed   By: Ulyses Jarred M.D.   On: 08/31/2016 18:27   Dg Chest Portable 1 View Result Date: 08/31/2016 Lungs hypoexpanded. Mild patchy left central airspace opacities may reflect mild pneumonia, depending on the patient's symptoms. Electronically Signed   By: Garald Balding M.D.   On: 08/31/2016 17:14      Scheduled Meds: . cefTRIAXone (ROCEPHIN)  IV  2 g Intravenous Q24H  . furosemide  20 mg Intravenous Once  . insulin aspart  0-15 Units Subcutaneous TID WC  . insulin aspart  0-5 Units Subcutaneous QHS  . senna  1 tablet Oral BID   Continuous Infusions:    LOS: 8 days    Time spent: 15 minutes  Greater than 50% of the time spent on counseling and coordinating the care.   Leisa Lenz, MD [facility] Pager [phone]  If 7PM-7AM, please contact night-coverage [email or web address] Password [facility] 09/08/2016, 4:53 PM

## 2016-09-08 NOTE — Clinical Social Work Note (Signed)
CSW met with pt to confirm bed choice. Pt chose [facility] CSW spoke with Santiago Glad at [place] who confirmed bed availability. CSW will continue to follow.   [name], [place], [place] Work [phone]

## 2016-09-08 NOTE — Consult Note (Signed)
[facility] CM Primary Care Navigator  09/08/2016  Toni [name] Apr 10, 1933 [id number]  Met with patient and daughter Toni [name]) at the bedside to identify possible discharge needs. Patient's daughter states that fever, chills and coughing had led to this admission.  Patient endorses Dr. Lucianne Lei with [facility] as the primary care provider.    Patient shared using [place] [facility] to obtain medications without any problem.   Patient manages her own medications at home using "pill box" system.   Daughter will be providing transportation to her doctors' appointments.  Two granddaughters Toni [name]) will serve as primary caregivers at home.   Discharge plan is skilled nursing facility for short term rehabilitation prior to returning home. Patient voiced preference for [facility].  Patient and daughter voiced understanding to call primary care provider's office when she returns home, for a post discharge follow-up appointment within a week or sooner if needed. Patient letter (with PCP's contact number) was provided as a reminder.  Patient denies any health management needs or concerns at this time. [facility] care management contact information was provided for future needs that may arise.  Patient and daughter mentioned of need for bedside commode when she gets home. Inpatient CM contacted to notify about this need.  For additional questions please contact:  Edwena Felty A. Jaeven Wanzer, BSN, RN-BC [facility] PRIMARY CARE Navigator Cell: [phone]

## 2016-09-08 NOTE — Care Management Important Message (Signed)
Important Message  Patient Details  Name: Toni [name] MRN: [id number] Date of Birth: 04-Jul-1933   Medicare Important Message Given:  Yes    [name] [name] 09/08/2016, 11:32 AM

## 2016-09-09 DIAGNOSIS — R609 Edema, unspecified: Secondary | ICD-10-CM | POA: Diagnosis not present

## 2016-09-09 DIAGNOSIS — B9620 Unspecified Escherichia coli [E. coli] as the cause of diseases classified elsewhere: Secondary | ICD-10-CM | POA: Diagnosis not present

## 2016-09-09 DIAGNOSIS — R2681 Unsteadiness on feet: Secondary | ICD-10-CM | POA: Diagnosis not present

## 2016-09-09 DIAGNOSIS — R4182 Altered mental status, unspecified: Secondary | ICD-10-CM | POA: Diagnosis not present

## 2016-09-09 DIAGNOSIS — R6521 Severe sepsis with septic shock: Secondary | ICD-10-CM | POA: Diagnosis not present

## 2016-09-09 DIAGNOSIS — E440 Moderate protein-calorie malnutrition: Secondary | ICD-10-CM | POA: Diagnosis not present

## 2016-09-09 DIAGNOSIS — I10 Essential (primary) hypertension: Secondary | ICD-10-CM | POA: Diagnosis not present

## 2016-09-09 DIAGNOSIS — G9340 Encephalopathy, unspecified: Secondary | ICD-10-CM | POA: Diagnosis not present

## 2016-09-09 DIAGNOSIS — N390 Urinary tract infection, site not specified: Secondary | ICD-10-CM | POA: Diagnosis not present

## 2016-09-09 DIAGNOSIS — K5901 Slow transit constipation: Secondary | ICD-10-CM | POA: Diagnosis not present

## 2016-09-09 DIAGNOSIS — R0602 Shortness of breath: Secondary | ICD-10-CM | POA: Diagnosis not present

## 2016-09-09 DIAGNOSIS — E7220 Disorder of urea cycle metabolism, unspecified: Secondary | ICD-10-CM | POA: Diagnosis not present

## 2016-09-09 DIAGNOSIS — R748 Abnormal levels of other serum enzymes: Secondary | ICD-10-CM | POA: Diagnosis not present

## 2016-09-09 DIAGNOSIS — R509 Fever, unspecified: Secondary | ICD-10-CM | POA: Diagnosis not present

## 2016-09-09 DIAGNOSIS — G9341 Metabolic encephalopathy: Secondary | ICD-10-CM | POA: Diagnosis not present

## 2016-09-09 DIAGNOSIS — R262 Difficulty in walking, not elsewhere classified: Secondary | ICD-10-CM | POA: Diagnosis not present

## 2016-09-09 DIAGNOSIS — R531 Weakness: Secondary | ICD-10-CM | POA: Diagnosis not present

## 2016-09-09 DIAGNOSIS — R488 Other symbolic dysfunctions: Secondary | ICD-10-CM | POA: Diagnosis not present

## 2016-09-09 DIAGNOSIS — E782 Mixed hyperlipidemia: Secondary | ICD-10-CM | POA: Diagnosis not present

## 2016-09-09 DIAGNOSIS — M6281 Muscle weakness (generalized): Secondary | ICD-10-CM | POA: Diagnosis not present

## 2016-09-09 DIAGNOSIS — E1169 Type 2 diabetes mellitus with other specified complication: Secondary | ICD-10-CM | POA: Diagnosis not present

## 2016-09-09 DIAGNOSIS — J9600 Acute respiratory failure, unspecified whether with hypoxia or hypercapnia: Secondary | ICD-10-CM | POA: Diagnosis not present

## 2016-09-09 DIAGNOSIS — E1100 Type 2 diabetes mellitus with hyperosmolarity without nonketotic hyperglycemic-hyperosmolar coma (NKHHC): Secondary | ICD-10-CM | POA: Diagnosis not present

## 2016-09-09 DIAGNOSIS — E8809 Other disorders of plasma-protein metabolism, not elsewhere classified: Secondary | ICD-10-CM | POA: Diagnosis not present

## 2016-09-09 DIAGNOSIS — Z96653 Presence of artificial knee joint, bilateral: Secondary | ICD-10-CM | POA: Diagnosis not present

## 2016-09-09 DIAGNOSIS — K5909 Other constipation: Secondary | ICD-10-CM | POA: Diagnosis not present

## 2016-09-09 DIAGNOSIS — J189 Pneumonia, unspecified organism: Secondary | ICD-10-CM | POA: Diagnosis not present

## 2016-09-09 DIAGNOSIS — D649 Anemia, unspecified: Secondary | ICD-10-CM | POA: Diagnosis not present

## 2016-09-09 DIAGNOSIS — I5032 Chronic diastolic (congestive) heart failure: Secondary | ICD-10-CM | POA: Diagnosis not present

## 2016-09-09 DIAGNOSIS — B9689 Other specified bacterial agents as the cause of diseases classified elsewhere: Secondary | ICD-10-CM | POA: Diagnosis not present

## 2016-09-09 DIAGNOSIS — R278 Other lack of coordination: Secondary | ICD-10-CM | POA: Diagnosis not present

## 2016-09-09 DIAGNOSIS — R7989 Other specified abnormal findings of blood chemistry: Secondary | ICD-10-CM | POA: Diagnosis not present

## 2016-09-09 DIAGNOSIS — D72829 Elevated white blood cell count, unspecified: Secondary | ICD-10-CM | POA: Diagnosis not present

## 2016-09-09 DIAGNOSIS — E119 Type 2 diabetes mellitus without complications: Secondary | ICD-10-CM | POA: Diagnosis not present

## 2016-09-09 DIAGNOSIS — Z794 Long term (current) use of insulin: Secondary | ICD-10-CM | POA: Diagnosis not present

## 2016-09-09 DIAGNOSIS — R5381 Other malaise: Secondary | ICD-10-CM | POA: Diagnosis not present

## 2016-09-09 DIAGNOSIS — Z7982 Long term (current) use of aspirin: Secondary | ICD-10-CM | POA: Diagnosis not present

## 2016-09-09 DIAGNOSIS — R600 Localized edema: Secondary | ICD-10-CM | POA: Diagnosis not present

## 2016-09-09 DIAGNOSIS — D509 Iron deficiency anemia, unspecified: Secondary | ICD-10-CM | POA: Diagnosis not present

## 2016-09-09 DIAGNOSIS — E669 Obesity, unspecified: Secondary | ICD-10-CM | POA: Diagnosis not present

## 2016-09-09 DIAGNOSIS — A409 Streptococcal sepsis, unspecified: Secondary | ICD-10-CM | POA: Diagnosis not present

## 2016-09-09 DIAGNOSIS — R6520 Severe sepsis without septic shock: Secondary | ICD-10-CM | POA: Diagnosis not present

## 2016-09-09 LAB — CBC
HCT: 30.9 % — ABNORMAL LOW (ref 36.0–46.0)
HEMOGLOBIN: 9.2 g/dL — AB (ref 12.0–15.0)
MCH: 27.2 pg (ref 26.0–34.0)
MCHC: 29.8 g/dL — ABNORMAL LOW (ref 30.0–36.0)
MCV: 91.4 fL (ref 78.0–100.0)
PLATELETS: 257 10*3/uL (ref 150–400)
RBC: 3.38 MIL/uL — AB (ref 3.87–5.11)
RDW: 15.8 % — ABNORMAL HIGH (ref 11.5–15.5)
WBC: 6.4 10*3/uL (ref 4.0–10.5)

## 2016-09-09 LAB — GLUCOSE, CAPILLARY
GLUCOSE-CAPILLARY: 141 mg/dL — AB (ref 65–99)
Glucose-Capillary: 206 mg/dL — ABNORMAL HIGH (ref 65–99)
Glucose-Capillary: 168 mg/dL — ABNORMAL HIGH (ref 65–99)

## 2016-09-09 MED ORDER — ALBUTEROL SULFATE (2.5 MG/3ML) 0.083% IN NEBU: 2.5000 mg | INHALATION_SOLUTION | RESPIRATORY_TRACT | 12 refills | Status: DC | PRN

## 2016-09-09 MED ORDER — SENNA 8.6 MG PO TABS: 1.0000 | ORAL_TABLET | Freq: Two times a day (BID) | ORAL | 0 refills | Status: DC

## 2016-09-09 MED ORDER — FUROSEMIDE 20 MG PO TABS: 20.0000 mg | ORAL_TABLET | Freq: Every day | ORAL | 0 refills | Status: DC

## 2016-09-09 MED ORDER — DEXTROSE 5 % IV SOLN: 2.0000 g | INTRAVENOUS | 0 refills | Status: AC

## 2016-09-09 MED ORDER — GUAIFENESIN-DM 100-10 MG/5ML PO SYRP: 5.0000 mL | ORAL_SOLUTION | ORAL | 0 refills | Status: DC | PRN

## 2016-09-09 MED ORDER — HEPARIN SOD (PORK) LOCK FLUSH 100 UNIT/ML IV SOLN
250.0000 [IU] | INTRAVENOUS | Status: AC | PRN
  Administered 2016-09-09: 250 [IU]

## 2016-09-09 MED ORDER — SODIUM CHLORIDE 0.9% FLUSH: 10.0000 mL | Freq: Two times a day (BID) | INTRAVENOUS | Status: DC

## 2016-09-09 MED ORDER — FUROSEMIDE 10 MG/ML IJ SOLN
40.0000 mg | Freq: Every day | INTRAMUSCULAR | Status: DC
  Administered 2016-09-09: 40 mg via INTRAVENOUS
  Filled 2016-09-09: qty 4

## 2016-09-09 MED ORDER — ALBUTEROL SULFATE (2.5 MG/3ML) 0.083% IN NEBU
2.5000 mg | INHALATION_SOLUTION | RESPIRATORY_TRACT | Status: DC | PRN
  Administered 2016-09-09: 2.5 mg via RESPIRATORY_TRACT
  Filled 2016-09-09: qty 3

## 2016-09-09 MED ORDER — GUAIFENESIN-DM 100-10 MG/5ML PO SYRP
5.0000 mL | ORAL_SOLUTION | ORAL | Status: DC | PRN
  Administered 2016-09-09: 5 mL via ORAL
  Filled 2016-09-09: qty 5

## 2016-09-09 MED ORDER — BLISTEX MEDICATED EX OINT
TOPICAL_OINTMENT | CUTANEOUS | Status: DC | PRN
  Filled 2016-09-09: qty 6.3

## 2016-09-09 MED ORDER — POTASSIUM CHLORIDE ER 10 MEQ PO TBCR: 10.0000 meq | EXTENDED_RELEASE_TABLET | Freq: Every day | ORAL | 0 refills | Status: DC

## 2016-09-09 MED ORDER — INSULIN ASPART 100 UNIT/ML ~~LOC~~ SOLN: 0.0000 [IU] | Freq: Three times a day (TID) | SUBCUTANEOUS | 11 refills | Status: DC

## 2016-09-09 MED ORDER — LIP MEDEX EX OINT: 1.0000 "application " | TOPICAL_OINTMENT | CUTANEOUS | Status: DC | PRN

## 2016-09-09 MED ORDER — SODIUM CHLORIDE 0.9% FLUSH: 10.0000 mL | INTRAVENOUS | Status: DC | PRN

## 2016-09-09 NOTE — Progress Notes (Signed)
Informed patient they are planning on putting in PICC line to do continued IV antibiotics until 3/1 and she understood, I also called patient's daughter to inform her.

## 2016-09-09 NOTE — Progress Notes (Signed)
Physical Therapy Treatment Patient Details Name: Toni [name] MRN: [id number] DOB: Apr 25, 1933 Today's Date: 09/09/2016    History of Present Illness Pt is an 83 y/o female admitted secondary to AMS, found to be septic secondary to a UTI. PMH including but not limited to DM and PVD.    PT Comments    Pt was willing to participate in PT treatment to get to EoB but was too fatigued and painful in L UE from PICC line placement to be able to perform. Pt  amenable to bed exercises instead and was able to perform with verbal cues for breathing. PT requires skilled PT for bed mobility, transfer and ambulation training as well as increasing LE strength and endurance.   Follow Up Recommendations  SNF     Equipment Recommendations  None recommended by PT    Recommendations for Other Services       Precautions / Restrictions Precautions Precautions: Fall Precaution Comments: pt reported multiple falls in the last six months  Restrictions Weight Bearing Restrictions: No    Mobility  Bed Mobility Overal bed mobility: Needs Assistance             General bed mobility comments: pt attempted to come to EoB but limited my fatigue and pain in L arm   Transfers                 General transfer comment: per nurse pt was able to get to recliner with RW today unable to assess secondary to increased fatigue post PICC line placement        Cognition Arousal/Alertness: Awake/alert Behavior During Therapy: [facility] for tasks assessed/performed Overall Cognitive Status: Within Functional Limits for tasks assessed Area of Impairment: Orientation Orientation Level: Disoriented to;Time       Safety/Judgement: Decreased awareness of safety   Problem Solving: Slow processing;Requires verbal cues;Requires tactile cues General Comments: pt able to follow commands for therex     Exercises General Exercises - Lower Extremity Ankle Circles/Pumps: AROM;Both;10 reps;Supine Quad Sets:  Left;Right;Supine;AROM (10 right 5 left, left decreased due to L knee pain) Gluteal Sets: AROM;Supine;10 reps Heel Slides: AROM;Both;Supine (10 right 5 left, left decreased due to L knee pain) Hip ABduction/ADduction: AROM;Both;10 reps;Supine Straight Leg Raises: AROM;5 reps;Right (AAROM on left 5 reps)    General Comments General comments (skin integrity, edema, etc.): pt willing to participate to get to EoB but once initiated was too fatigued to move trunk. Pt participated in bed exercises instead      Pertinent Vitals/Pain Pain Assessment: 0-10 Pain Score: 4  Pain Location: PICC line insertion site Pain Descriptors / Indicators: Aching;Sore           PT Goals (current goals can now be found in the care plan section) Acute Rehab PT Goals Patient Stated Goal: go to rehab to get stronger Progress towards PT goals: Not progressing toward goals - comment (limited by fatigue and pain)    Frequency    Min 2X/week      PT Plan Current plan remains appropriate    Co-evaluation             End of Session   Activity Tolerance: Patient limited by pain;Patient limited by fatigue Patient left: in bed;with call bell/phone within reach Nurse Communication: Mobility status (Pain Lt shoulder near IV site) PT Visit Diagnosis: Muscle weakness (generalized) (M62.81);Difficulty in walking, not elsewhere classified (R26.2)     Time: [id number] PT Time Calculation (min) (ACUTE ONLY): 18 min  Charges:  $Therapeutic  Exercise: 8-22 mins                    G Codes:       Bailey Mech [facility] 09/09/2016, 4:40 PM Dani Gobble. Migdalia Dk PT, DPT Acute Rehabilitation  [phone]

## 2016-09-09 NOTE — Clinical Social Work Placement (Signed)
   CLINICAL SOCIAL WORK PLACEMENT  NOTE  Date:  09/09/2016  Patient Details  Name: Toni [name] MRN: [id number] Date of Birth: Nov 26, 1932  Clinical Social Work is seeking post-discharge placement for this patient at the [place] level of care (*CSW will initial, date and re-position this form in  chart as items are completed):  Yes   Patient/family provided with [place] Work Department's list of facilities offering this level of care within the geographic area requested by the patient (or if unable, by the patient's family).  Yes   Patient/family informed of their freedom to choose among providers that offer the needed level of care, that participate in Medicare, Medicaid or managed care program needed by the patient, have an available bed and are willing to accept the patient.  Yes   Patient/family informed of [place]'s ownership interest in [facility] and [facility], as well as of the fact that they are under no obligation to receive care at these facilities.  PASRR submitted to EDS on 09/04/16     PASRR number received on 09/04/16     Existing PASRR number confirmed on       FL2 transmitted to all facilities in geographic area requested by pt/family on 09/04/16     FL2 transmitted to all facilities within larger geographic area on       Patient informed that his/her managed care company has contracts with or will negotiate with certain facilities, including the following:        Yes   Patient/family informed of bed offers received.  Patient chooses bed at [facility]     Physician recommends and patient chooses bed at      Patient to be transferred to [facility] on 09/09/16.  Patient to be transferred to facility by [facility]     Patient family notified on 09/09/16 of transfer.  Name of family member notified:  Noel Christmas      PHYSICIAN       Additional Comment:  Pt is ready for discharge today and will go to  [facility]. Pt and daughter are aware and agreeable to discharge plan. CSW sent clinicals to [facility] and communicated with Santiago Glad S./Jessica (admissions) for room and report. CSW also informed Santiago Glad about pt PICC installation and pt needing IV antibiotics until 3/1. Room and report provided to RN and put in treatment team sticky note. Transportation arranged with [facility]. CSW informed dtr of pt transportation. CSW is signing off as no further needs identified.  _______________________________________________ Truitt Merle, LCSW 09/09/2016, 5:07 PM

## 2016-09-09 NOTE — Progress Notes (Signed)
Toni [name] to be D/C'd  per MD order. Discussed with the patient and all questions fully answered.  VSS, Skin clean, dry and intact without evidence of skin break down, no evidence of skin tears noted.  IV catheter discontinued intact. Site without signs and symptoms of complications. Dressing and pressure applied.  An After Visit Summary was printed and given to EMS as well as Toni [name] and prescriptions  Patient transported to [facility], called family and let them know she was leaving.  Room searching and all belongings given to EMS in patient belongings bag.    She is going with PICC line to facility for continued IV antibiotics.

## 2016-09-09 NOTE — Progress Notes (Signed)
Peripherally Inserted Central Catheter/Midline Placement  The IV Nurse has discussed with the patient and/or persons authorized to consent for the patient, the purpose of this procedure and the potential benefits and risks involved with this procedure.  The benefits include less needle sticks, lab draws from the catheter, and the patient may be discharged home with the catheter. Risks include, but not limited to, infection, bleeding, blood clot (thrombus formation), and puncture of an artery; nerve damage and irregular heartbeat and possibility to perform a PICC exchange if needed/ordered by physician.  Alternatives to this procedure were also discussed.  Bard Power PICC patient education guide, fact sheet on infection prevention and patient information card has been provided to patient /or left at bedside.    PICC/Midline Placement Documentation  PICC Single Lumen 09/09/16 PICC Right Brachial 37 cm 2 cm (Active)  Indication for Insertion or Continuance of Line Home intravenous therapies (PICC only) 09/09/2016  3:27 PM  Exposed Catheter (cm) 2 cm 09/09/2016  3:27 PM  Site Assessment Clean;Dry;Intact 09/09/2016  3:27 PM  Line Status Flushed;Saline locked;Blood return noted 09/09/2016  3:27 PM  Dressing Type Transparent 09/09/2016  3:27 PM  Dressing Status Clean;Dry;Intact;Antimicrobial disc in place 09/09/2016  3:27 PM  Line Care Connections checked and tightened 09/09/2016  3:27 PM  Line Adjustment (NICU/IV Team Only) No 09/09/2016  3:27 PM  Dressing Intervention New dressing 09/09/2016  3:27 PM  Dressing Change Due 09/16/16 09/09/2016  3:27 PM       Rolena Infante 09/09/2016, 3:29 PM

## 2016-09-09 NOTE — Discharge Summary (Signed)
Physician Discharge Summary  Toni [name] LZJ:[id number] DOB: Mar 19, 1933 DOA: 08/31/2016  PCP: Elyn Peers, MD  Admit date: 08/31/2016 Discharge date: 09/09/2016  Recommendations for Outpatient Follow-up:  Continue Rocephin through 09/16/2016 for Streptococcus bacteremia. Continue Lasix for lower extremity swelling 20 mg daily in addition to low-dose potassium supplementation.  Discharge Diagnoses:  Principal Problem:   Acute metabolic encephalopathy Active Problems:   Community acquired pneumonia   Streptococcal sepsis ([place])   Streptococcal bacteremia   E. coli UTI   Leukocytosis   Sepsis ([place])   Goals of care, counseling/discussion   Palliative care by specialist    Discharge Condition: stable   Diet recommendation: as tolerated   History of present illness:  83 y.o.femalewith history of HTN who presented with altered mental status for past few days prior to this admission. Per family report, patient apparently had a cough 2 weeks ago, got a flu shot one week ago and that after that started to get very weak and confused. In ED, blood pressure was as low as 76/43. Tmax 103.44F, HR 57-106, RR 22-46. Blood work was notable for glucose of 336, lactic acid 6.29, white blood cell count 12.2 and then repeat count 39.1. Ethanol was within normal limits. Urinalysis showed positive nitrites and many bacteria. Urine and blood cultures were obtained, respiratory virus panel is pending. Chest x-ray showed left central airspace opacity. Patient was started on empiric vancomycin and Zosyn for sepsis thought to be due to UTI and possibly pneumonia. Further work up revealed strep bacteremia. Pt underwent TEE with no evidence of vegetations or endocarditis.  Hospital Course:   Principal Problem: Sepsis secondary to community acquired pneumonia and streptococcus F bacteremia and E.Coli UTI / leukocytosis - Sepsis criteria met on the admission with fever, hypotension, tachycardia, tachypnea,  lactic acidosis, leukocytosis. Procalcitonin elevated at 68.82 - Multiple sources of infection: strep bacteremia F, E.Coli UTI (blood cx growing strep species, sens report pending. UCx growing E.Coli) - Repeat blood cx - no growth so far - Stopped tamiflu 2/15 - Stopped zosyn, azithro and vanco and currently on rocephin - No vegetation on ECHO, normal EF - No vegetation or mass on TEE - CXR with possible pneumonia but she is already on rocephin and will cotninue this through 09/16/2016 - Will need PICC lien prior to discharge   Active Problems: Acute metabolic encephalopathy - Likely related to sepsis and multiple sources of infection - Stable, AAOx3  Hyperglycemia - Continue sliding scale insulin if needed   Normocytic anemia  - Hemoglobin 8.1, down from 11.2 on the admission - Hgb stable   Hyperkalemia - Mild, 5.3 on 2/19  - Repeat potassium WNL   DVT prophylaxis:  SCD's bilaterally  Code Status: DNR/DNI Family Communication: No family at the bedside; spoke with her daughter over the phone 2/21 and informed of D/C plan today    Consultants:   Palliative care  CCM - signed off 2/15  Cardio for TEE  ID - Dr. Tommy Medal, just phone call, no official consult unless vegetation on TEE seen then will have to consult them   Procedures:   None  Antimicrobials:   Vancomycin and Zosyn 08/31/2016 --> 2/15  Azithromycin 08/31/2016 --> 2/15  Rocephin 2/15 --> 09/16/2016  Signed:  Leisa Lenz, MD  [facility] 09/09/2016, 12:01 PM  Pager #: [phone]  Time spent in minutes: more than 30 minutes   Discharge Exam: Vitals:   09/09/16 0433 09/09/16 1000  BP: (!) 149/75 131/79  Pulse: 83 88  Resp: 17 18  Temp: 97.7 F (36.5 C) 97.8 F (36.6 C)   Vitals:   09/08/16 1430 09/08/16 2139 09/09/16 0433 09/09/16 1000  BP: 124/74 123/73 (!) 149/75 131/79  Pulse: 93 97 83 88  Resp: 18 17 17 18   Temp: 98.9 F (37.2 C) 98.5 F (36.9 C) 97.7 F (36.5  C) 97.8 F (36.6 C)  TempSrc: Oral Oral Oral Oral  SpO2: 100% 100% 100% 100%  Weight:   89.2 kg (196 lb 9.6 oz)   Height:        General: Pt is alert, follows commands appropriately, not in acute distress Cardiovascular: Regular rate and rhythm, S1/S2  Respiratory: Clear to auscultation bilaterally, no wheezing, no crackles, no rhonchi Abdominal: Soft, non tender, non distended, bowel sounds +, no guarding Extremities:  LE edema appreciated, no cyanosis, pulses palpable bilaterally DP and PT Neuro: Grossly nonfocal  Discharge Instructions  Discharge Instructions    Call MD for:  persistant nausea and vomiting    Complete by:  As directed    Call MD for:  redness, tenderness, or signs of infection (pain, swelling, redness, odor or green/yellow discharge around incision site)    Complete by:  As directed    Call MD for:  severe uncontrolled pain    Complete by:  As directed    Diet - low sodium heart healthy    Complete by:  As directed    Discharge instructions    Complete by:  As directed    Continue Rocephin through 09/16/2016 for Streptococcus bacteremia. Continue Lasix for lower extremity swelling 20 mg daily in addition to low-dose potassium supplementation.   Increase activity slowly    Complete by:  As directed      Allergies as of 09/09/2016   No Known Allergies     Medication List    TAKE these medications   acetaminophen 325 MG tablet Commonly known as:  TYLENOL Take 325-650 mg by mouth every 6 (six) hours as needed (for knee pain).   albuterol (2.5 MG/3ML) 0.083% nebulizer solution Commonly known as:  PROVENTIL Take 3 mLs (2.5 mg total) by nebulization every 4 (four) hours as needed for wheezing or shortness of breath.   aspirin 81 MG tablet Take 81 mg by mouth daily.   cefTRIAXone 2 g in dextrose 5 % 50 mL Inject 2 g into the vein daily.   CENTRUM SILVER ADULT 50+ PO Take 1 tablet by mouth daily.   furosemide 20 MG tablet Commonly known as:   LASIX Take 1 tablet (20 mg total) by mouth daily.   guaiFENesin-dextromethorphan 100-10 MG/5ML syrup Commonly known as:  ROBITUSSIN DM Take 5 mLs by mouth every 4 (four) hours as needed for cough (chest congestion).   insulin aspart 100 UNIT/ML injection Commonly known as:  novoLOG Inject 0-15 Units into the skin 3 (three) times daily with meals.   potassium chloride 10 MEQ tablet Commonly known as:  K-DUR Take 1 tablet (10 mEq total) by mouth daily.   senna 8.6 MG Tabs tablet Commonly known as:  SENOKOT Take 1 tablet (8.6 mg total) by mouth 2 (two) times daily.   simvastatin 40 MG tablet Commonly known as:  ZOCOR Take 40 mg by mouth daily.   SYSTANE ULTRA OP Apply 1-2 drops to eye 2 (two) times daily as needed (for dry eyes).   verapamil 180 MG 24 hr capsule Commonly known as:  VERELAN PM Take 180 mg by mouth daily.      Follow-up  Information    Elyn Peers, MD. Schedule an appointment as soon as possible for a visit in 1 week(s).   Specialty:  Family Medicine Contact information: [address] [place] [address] [phone]            The results of significant diagnostics from this hospitalization (including imaging, microbiology, ancillary and laboratory) are listed below for reference.    Significant Diagnostic Studies: Ct Head Wo Contrast  Result Date: 08/31/2016 CLINICAL DATA:  Altered mental status EXAM: CT HEAD WITHOUT CONTRAST TECHNIQUE: Contiguous axial images were obtained from the base of the skull through the vertex without intravenous contrast. COMPARISON:  None. FINDINGS: Brain: No mass lesion, intraparenchymal hemorrhage or extra-axial collection. No evidence of acute cortical infarct. There is periventricular hypoattenuation compatible with chronic microvascular disease. There is bilateral basal ganglia mineralization. Vascular: No hyperdense vessel or unexpected calcification. Skull: Normal visualized skull base, calvarium and  extracranial soft tissues. Sinuses/Orbits: No sinus fluid levels or advanced mucosal thickening. No mastoid effusion. Normal orbits. IMPRESSION: Chronic microvascular ischemia without acute intracranial abnormality. Electronically Signed   By: Ulyses Jarred M.D.   On: 08/31/2016 18:27   Dg Chest Port 1 View  Result Date: 09/08/2016 CLINICAL DATA:  Initial evaluation for acute shortness of breath, history of pneumonia. EXAM: PORTABLE CHEST 1 VIEW COMPARISON:  Prior radiograph from 08/31/2016. FINDINGS: Stable cardiomegaly. Mediastinal silhouette within normal limits. Aortic atherosclerosis noted. Lungs hypoinflated. Patchy consolidation at the left lung base, concerning for possible infiltrate. Bronchovascular crowding/ atelectasis present the right lung base as well. No pulmonary edema. Probable small left pleural effusion. No pneumothorax. No acute osseus abnormality. IMPRESSION: 1. Left basilar consolidation, concerning for possible pneumonia. 2. Probable small left pleural effusion. 3. Stable cardiomegaly without pulmonary edema. 4. Aortic atherosclerosis. Electronically Signed   By: Jeannine Boga M.D.   On: 09/08/2016 19:05   Dg Chest Portable 1 View  Result Date: 08/31/2016 CLINICAL DATA:  New onset of tremors. Altered mental status. Initial encounter. EXAM: PORTABLE CHEST 1 VIEW COMPARISON:  Chest radiograph performed 03/31/2011 FINDINGS: The lungs are hypoexpanded. Mild patchy left central airspace opacities may reflect pneumonia, depending on the patient's symptoms. There is no evidence of pleural effusion or pneumothorax. The cardiomediastinal silhouette is borderline normal in size. No acute osseous abnormalities are seen. IMPRESSION: Lungs hypoexpanded. Mild patchy left central airspace opacities may reflect mild pneumonia, depending on the patient's symptoms. Electronically Signed   By: Garald Balding M.D.   On: 08/31/2016 17:14    Microbiology: Recent Results (from the past 240  hour(s))  Blood Culture (routine x 2)     Status: Abnormal   Collection Time: 08/31/16  4:45 PM  Result Value Ref Range Status   Specimen Description BLOOD RIGHT ANTECUBITAL  Final   Special Requests BOTTLES DRAWN AEROBIC ONLY 10CC  Final   Culture  Setup Time   Final    GRAM VARIABLE COCCI AEROBIC BOTTLE ONLY Organism ID to follow CRITICAL RESULT CALLED TO, READ BACK BY AND VERIFIED WITH: LPablo Lawrence.D. 16:15 09/01/16 ([email or web address])    Culture STREPTOCOCCUS GROUP F (A)  Final   Report Status 09/03/2016 FINAL  Final   Organism ID, Bacteria STREPTOCOCCUS GROUP F  Final      Susceptibility   Streptococcus group f - MIC*    PENICILLIN 0.25 INTERMEDIATE Intermediate     CEFTRIAXONE 1 SENSITIVE Sensitive     ERYTHROMYCIN <=0.12 SENSITIVE Sensitive     LEVOFLOXACIN 0.5 SENSITIVE Sensitive  VANCOMYCIN 0.5 SENSITIVE Sensitive     * STREPTOCOCCUS GROUP F  Blood Culture ID Panel (Reflexed)     Status: Abnormal   Collection Time: 08/31/16  4:45 PM  Result Value Ref Range Status   Enterococcus species NOT DETECTED NOT DETECTED Final   Listeria monocytogenes NOT DETECTED NOT DETECTED Final   Staphylococcus species NOT DETECTED NOT DETECTED Final   Staphylococcus aureus NOT DETECTED NOT DETECTED Final   Streptococcus species DETECTED (A) NOT DETECTED Final    Comment: Not Enterococcus species, Streptococcus agalactiae, Streptococcus pyogenes, or Streptococcus pneumoniae. CRITICAL RESULT CALLED TO, READ BACK BY AND VERIFIED WITH: LPablo Lawrence.D. 16:15 09/01/16 ([email or web address])    Streptococcus agalactiae NOT DETECTED NOT DETECTED Final   Streptococcus pneumoniae NOT DETECTED NOT DETECTED Final   Streptococcus pyogenes NOT DETECTED NOT DETECTED Final   Acinetobacter baumannii NOT DETECTED NOT DETECTED Final   Enterobacteriaceae species NOT DETECTED NOT DETECTED Final   Enterobacter cloacae complex NOT DETECTED NOT DETECTED Final   Escherichia coli NOT DETECTED NOT DETECTED Final   Klebsiella  oxytoca NOT DETECTED NOT DETECTED Final   Klebsiella pneumoniae NOT DETECTED NOT DETECTED Final   Proteus species NOT DETECTED NOT DETECTED Final   Serratia marcescens NOT DETECTED NOT DETECTED Final   Haemophilus influenzae NOT DETECTED NOT DETECTED Final   Neisseria meningitidis NOT DETECTED NOT DETECTED Final   Pseudomonas aeruginosa NOT DETECTED NOT DETECTED Final   Candida albicans NOT DETECTED NOT DETECTED Final   Candida glabrata NOT DETECTED NOT DETECTED Final   Candida krusei NOT DETECTED NOT DETECTED Final   Candida parapsilosis NOT DETECTED NOT DETECTED Final   Candida tropicalis NOT DETECTED NOT DETECTED Final  Urine culture     Status: Abnormal   Collection Time: 08/31/16  4:47 PM  Result Value Ref Range Status   Specimen Description URINE, CATHETERIZED  Final   Special Requests NONE  Final   Culture >=100,000 COLONIES/mL ESCHERICHIA COLI (A)  Final   Report Status 09/02/2016 FINAL  Final   Organism ID, Bacteria ESCHERICHIA COLI (A)  Final      Susceptibility   Escherichia coli - MIC*    AMPICILLIN <=2 SENSITIVE Sensitive     CEFAZOLIN <=4 SENSITIVE Sensitive     CEFTRIAXONE <=1 SENSITIVE Sensitive     CIPROFLOXACIN <=0.25 SENSITIVE Sensitive     GENTAMICIN <=1 SENSITIVE Sensitive     IMIPENEM <=0.25 SENSITIVE Sensitive     NITROFURANTOIN <=16 SENSITIVE Sensitive     TRIMETH/SULFA <=20 SENSITIVE Sensitive     AMPICILLIN/SULBACTAM <=2 SENSITIVE Sensitive     PIP/TAZO <=4 SENSITIVE Sensitive     Extended ESBL NEGATIVE Sensitive     * >=100,000 COLONIES/mL ESCHERICHIA COLI  Blood Culture (routine x 2)     Status: Abnormal   Collection Time: 08/31/16  4:50 PM  Result Value Ref Range Status   Specimen Description BLOOD RIGHT HAND  Final   Special Requests IN PEDIATRIC BOTTLE 3CC  Final   Culture  Setup Time   Final    GRAM VARIABLE COCCI AEROBIC BOTTLE ONLY CRITICAL VALUE NOTED.  VALUE IS CONSISTENT WITH PREVIOUSLY REPORTED AND CALLED VALUE.    Culture (A)  Final     STREPTOCOCCUS GROUP F SUSCEPTIBILITIES PERFORMED ON PREVIOUS CULTURE WITHIN THE LAST 5 DAYS.    Report Status 09/03/2016 FINAL  Final  C difficile quick scan w PCR reflex     Status: None   Collection Time: 08/31/16  4:53 PM  Result Value Ref Range Status  C Diff antigen NEGATIVE NEGATIVE Final   C Diff toxin NEGATIVE NEGATIVE Final   C Diff interpretation No C. difficile detected.  Final  Respiratory Panel by PCR     Status: None   Collection Time: 08/31/16 10:09 PM  Result Value Ref Range Status   Adenovirus NOT DETECTED NOT DETECTED Final   Coronavirus 229E NOT DETECTED NOT DETECTED Final   Coronavirus HKU1 NOT DETECTED NOT DETECTED Final   Coronavirus NL63 NOT DETECTED NOT DETECTED Final   Coronavirus OC43 NOT DETECTED NOT DETECTED Final   Metapneumovirus NOT DETECTED NOT DETECTED Final   Rhinovirus / Enterovirus NOT DETECTED NOT DETECTED Final   Influenza A NOT DETECTED NOT DETECTED Final   Influenza B NOT DETECTED NOT DETECTED Final   Parainfluenza Virus 1 NOT DETECTED NOT DETECTED Final   Parainfluenza Virus 2 NOT DETECTED NOT DETECTED Final   Parainfluenza Virus 3 NOT DETECTED NOT DETECTED Final   Parainfluenza Virus 4 NOT DETECTED NOT DETECTED Final   Respiratory Syncytial Virus NOT DETECTED NOT DETECTED Final   Bordetella pertussis NOT DETECTED NOT DETECTED Final   Chlamydophila pneumoniae NOT DETECTED NOT DETECTED Final   Mycoplasma pneumoniae NOT DETECTED NOT DETECTED Final  MRSA PCR Screening     Status: None   Collection Time: 09/01/16  1:01 AM  Result Value Ref Range Status   MRSA by PCR NEGATIVE NEGATIVE Final    Comment:        The GeneXpert MRSA Assay (FDA approved for NASAL specimens only), is one component of a comprehensive MRSA colonization surveillance program. It is not intended to diagnose MRSA infection nor to guide or monitor treatment for MRSA infections.   Culture, blood (routine x 2)     Status: None   Collection Time: 09/03/16 11:50 AM   Result Value Ref Range Status   Specimen Description BLOOD RIGHT HAND  Final   Special Requests IN PEDIATRIC BOTTLE 1.5CC  Final   Culture NO GROWTH 5 DAYS  Final   Report Status 09/08/2016 FINAL  Final  Culture, blood (routine x 2)     Status: None   Collection Time: 09/03/16 11:50 AM  Result Value Ref Range Status   Specimen Description BLOOD LEFT HAND  Final   Special Requests IN PEDIATRIC BOTTLE 1.5CC  Final   Culture NO GROWTH 5 DAYS  Final   Report Status 09/08/2016 FINAL  Final     Labs: Basic Metabolic Panel:  Recent Labs Lab 09/03/16 0416 09/04/16 0734 09/06/16 0354 09/07/16 0439 09/07/16 0743  NA 138 139 140 137 138  K 3.4* 4.0 5.3* 3.7 3.7  CL 107 108 107 105 102  CO2 26 26 26 26 27   GLUCOSE 101* 143* 91 172* 161*  BUN 7 <5* <5* <5* <5*  CREATININE 0.41* 0.37* 0.38* 0.40* 0.41*  CALCIUM 8.3* 8.4* 6.5* 8.3* 8.4*   Liver Function Tests: No results for input(s): AST, ALT, ALKPHOS, BILITOT, PROT, ALBUMIN in the last 168 hours. No results for input(s): LIPASE, AMYLASE in the last 168 hours. No results for input(s): AMMONIA in the last 168 hours. CBC:  Recent Labs Lab 09/04/16 0734 09/06/16 0354 09/07/16 0439 09/08/16 0508 09/09/16 0516  WBC 15.0* 9.1 10.7* 7.2 6.4  HGB 9.4* 10.0* 9.1* 9.0* 9.2*  HCT 30.4* 31.9* 30.7* 30.4* 30.9*  MCV 89.9 90.4 90.8 91.8 91.4  PLT 263 229 261 270 257   Cardiac Enzymes: No results for input(s): CKTOTAL, CKMB, CKMBINDEX, TROPONINI in the last 168 hours. BNP: BNP (last 3  results) No results for input(s): BNP in the last 8760 hours.  ProBNP (last 3 results) No results for input(s): PROBNP in the last 8760 hours.  CBG:  Recent Labs Lab 09/08/16 1214 09/08/16 1624 09/08/16 2135 09/09/16 0749 09/09/16 1114  GLUCAP 164* 168* 229* 141* 206*

## 2016-09-09 NOTE — Progress Notes (Signed)
Patient has new PICC line, notified social work to get transportation arranged, cleared to go to facility.

## 2016-09-09 NOTE — Discharge Instructions (Signed)
Bacteremia Introduction Bacteremia is the presence of bacteria in the blood. A small amount of bacteria may not cause any symptoms. Sometimes, the bacteria spread and cause infection in other parts of the body, such as the heart, joints, bones, or brain. Having a great amount of bacteria can cause a serious, sometimes life-threatening infection called sepsis. What are the causes? This condition is caused by bacteria that get into the blood. Bacteria can enter the blood:  During a dental or medical procedure.  After you brush your teeth so hard that the gums bleed.  Through a scrape or cut on your skin. More severe types of bacteremia can be caused by:  A bacterial infection, such as pneumonia, that spreads to the blood.  Using a dirty needle. What increases the risk? This condition is more likely to develop in:  Children and elderly adults.  People who have a long-lasting (chronic) disease or medical condition.  People who have an artificial joint or heart valve.  People who have heart valve disease.  People who have a tube, such as a catheter or IV tube, that has been inserted for a medical treatment.  People who have a weak body defense system (immune system).  People who use IV drugs. What are the signs or symptoms? Usually, this condition does not cause symptoms when it is mild. When it is more serious, it may cause:  Fever.  Chills.  Racing heart.  Shortness of breath.  Dizziness.  Weakness.  Confusion.  Nausea or vomiting.  Diarrhea. Bacteremia that has spread to other parts of the body may cause symptoms in those areas. How is this diagnosed? This condition may be diagnosed with a physical exam and tests, such as:  A complete blood count (CBC). This test looks for signs of infection.  Blood cultures. These look for bacteria in your blood.  Tests of any IV tubes. These look for a source of infection.  Urine tests.  Imaging tests, such as an  X-ray, CT scan, MRI, or heart ultrasound. How is this treated? If the condition is mild, treatment is usually not needed. Usually, the bodys immune system will remove the bacteria. If the condition is more serious, it may be treated with:  Antibiotic medicines through an IV tube. These may be given for about 2 weeks. At first, the antibiotic that is given may kill most types of blood bacteria. If your test results show that a certain kind of bacteria is causing problems, the antibiotic may be changed to kill only the bacteria that are causing problems.  Antibiotics taken by mouth.  Removing any catheter or IV tube that is a source of infection.  Blood pressure and breathing support, if needed.  Surgery to control the source or spread of infection, if needed. Follow these instructions at home:  Take over-the-counter and prescription medicines only as told by your health care provider.  If you were prescribed an antibiotic, take it as told by your health care provider. Do not stop taking the antibiotic even if you start to feel better.  Rest at home until your condition is under control.  Drink enough fluid to keep your urine clear or pale yellow.  Keep all follow-up visits as told by your health care provider. This is important. How is this prevented? Take these actions to help prevent future episodes of bacteremia:  Get all vaccinations as recommended by your health care provider.  Clean and cover scrapes or cuts.  Bathe regularly.  Wash your  hands often.  Before any dental or surgical procedure, ask your health care provider if you should take an antibiotic. Contact a health care provider if:  Your symptoms get worse.  You continue to have symptoms after treatment.  You develop new symptoms after treatment. Get help right away if:  You have chest pain or trouble breathing.  You develop confusion, dizziness, or weakness.  You develop pale skin. This information is  not intended to replace advice given to you by your health care provider. Make sure you discuss any questions you have with your health care provider. Document Released: 04/18/2006 Document Revised: 01/23/2016 Document Reviewed: 09/07/2014  2017 [employer]

## 2016-09-09 NOTE — Progress Notes (Signed)
At 3am pt. called out with complaints of SOB, pulse ox was noted to be 93% on room air, RR were 30. Patient requested to get up to chair. SOB worsened with movement. O2 applied at 2L. Lower extremities were noted to be +2 pitting at this time. Patient stated that at home she takes Lisinopril/HCTZ and has not been receiving it while here.  Patient has voided many times during night but small amounts at a time, she has also had multipliable complaints of back pain during night. Tylenol given at 0054 with no relief.  Per MAR 20 mg lasix was given at 1730 on the [address] and chest x-ray ordered after complaints of SOB on day shift.   RN paged on call physican. Orders were received for PRN Albuterol breathing treatment.  Will continue to monitor patient.  Jimmie Molly, RN

## 2016-09-09 NOTE — Progress Notes (Signed)
Breathing treatment and another dose of Tylenol given. Pt. Still complaining of SOB although she states that "with oxygen it is alittle better". Pt. Also complaining of restlessness and some anxiety. Did not sleep during night. Still complaining of back pain, hot pack applied. Edema noted in upper and lower extremities.  Jimmie Molly, RN

## 2016-09-14 ENCOUNTER — Encounter (HOSPITAL_COMMUNITY): Payer: Self-pay | Admitting: *Deleted

## 2016-09-14 ENCOUNTER — Emergency Department (HOSPITAL_COMMUNITY)
Admission: EM | Admit: 2016-09-14 | Discharge: 2016-09-15 | Disposition: A | Discharge status: Home / Self Care | Payer: Medicare Other | Attending: Emergency Medicine | Admitting: Emergency Medicine

## 2016-09-14 ENCOUNTER — Emergency Department (HOSPITAL_COMMUNITY): Payer: Medicare Other

## 2016-09-14 DIAGNOSIS — Z794 Long term (current) use of insulin: Secondary | ICD-10-CM | POA: Insufficient documentation

## 2016-09-14 DIAGNOSIS — Z7982 Long term (current) use of aspirin: Secondary | ICD-10-CM | POA: Diagnosis not present

## 2016-09-14 DIAGNOSIS — R778 Other specified abnormalities of plasma proteins: Secondary | ICD-10-CM

## 2016-09-14 DIAGNOSIS — D649 Anemia, unspecified: Secondary | ICD-10-CM | POA: Insufficient documentation

## 2016-09-14 DIAGNOSIS — R748 Abnormal levels of other serum enzymes: Secondary | ICD-10-CM | POA: Diagnosis not present

## 2016-09-14 DIAGNOSIS — R7989 Other specified abnormal findings of blood chemistry: Secondary | ICD-10-CM | POA: Insufficient documentation

## 2016-09-14 DIAGNOSIS — R0602 Shortness of breath: Secondary | ICD-10-CM | POA: Insufficient documentation

## 2016-09-14 DIAGNOSIS — E119 Type 2 diabetes mellitus without complications: Secondary | ICD-10-CM | POA: Insufficient documentation

## 2016-09-14 DIAGNOSIS — Z96653 Presence of artificial knee joint, bilateral: Secondary | ICD-10-CM | POA: Insufficient documentation

## 2016-09-14 DIAGNOSIS — R531 Weakness: Secondary | ICD-10-CM | POA: Diagnosis not present

## 2016-09-14 LAB — URINALYSIS, ROUTINE W REFLEX MICROSCOPIC
BILIRUBIN URINE: NEGATIVE
Glucose, UA: NEGATIVE mg/dL
Hgb urine dipstick: NEGATIVE
KETONES UR: NEGATIVE mg/dL
LEUKOCYTES UA: NEGATIVE
NITRITE: NEGATIVE
PROTEIN: NEGATIVE mg/dL
Specific Gravity, Urine: 1.006 (ref 1.005–1.030)
pH: 7 (ref 5.0–8.0)

## 2016-09-14 LAB — CBC WITH DIFFERENTIAL/PLATELET
BASOS PCT: 0 %
Basophils Absolute: 0 10*3/uL (ref 0.0–0.1)
EOS ABS: 0.1 10*3/uL (ref 0.0–0.7)
Eosinophils Relative: 1 %
HCT: 33.7 % — ABNORMAL LOW (ref 36.0–46.0)
HEMOGLOBIN: 10.2 g/dL — AB (ref 12.0–15.0)
Lymphocytes Relative: 19 %
Lymphs Abs: 1.3 10*3/uL (ref 0.7–4.0)
MCH: 27.1 pg (ref 26.0–34.0)
MCHC: 30.3 g/dL (ref 30.0–36.0)
MCV: 89.6 fL (ref 78.0–100.0)
Monocytes Absolute: 0.5 10*3/uL (ref 0.1–1.0)
Monocytes Relative: 7 %
NEUTROS PCT: 73 %
Neutro Abs: 5.1 10*3/uL (ref 1.7–7.7)
PLATELETS: 209 10*3/uL (ref 150–400)
RBC: 3.76 MIL/uL — ABNORMAL LOW (ref 3.87–5.11)
RDW: 15.8 % — ABNORMAL HIGH (ref 11.5–15.5)
WBC: 7 10*3/uL (ref 4.0–10.5)

## 2016-09-14 LAB — COMPREHENSIVE METABOLIC PANEL
ALBUMIN: 2.1 g/dL — AB (ref 3.5–5.0)
ALT: 19 U/L (ref 14–54)
ANION GAP: 5 (ref 5–15)
AST: 43 U/L — ABNORMAL HIGH (ref 15–41)
Alkaline Phosphatase: 104 U/L (ref 38–126)
BILIRUBIN TOTAL: 1.1 mg/dL (ref 0.3–1.2)
BUN: 8 mg/dL (ref 6–20)
CO2: 30 mmol/L (ref 22–32)
Calcium: 8.4 mg/dL — ABNORMAL LOW (ref 8.9–10.3)
Chloride: 102 mmol/L (ref 101–111)
Creatinine, Ser: 0.54 mg/dL (ref 0.44–1.00)
GLUCOSE: 127 mg/dL — AB (ref 65–99)
Potassium: 4.3 mmol/L (ref 3.5–5.1)
Sodium: 137 mmol/L (ref 135–145)
TOTAL PROTEIN: 6.5 g/dL (ref 6.5–8.1)

## 2016-09-14 LAB — TROPONIN I
TROPONIN I: 0.06 ng/mL — AB (ref <0.03)
TROPONIN I: 0.06 ng/mL — AB (ref <0.03)

## 2016-09-14 LAB — I-STAT CG4 LACTIC ACID, ED
Lactic Acid, Venous: 2.17 mmol/L (ref 0.5–1.9)
Lactic Acid, Venous: 1.43 mmol/L (ref 0.5–1.9)

## 2016-09-14 LAB — BRAIN NATRIURETIC PEPTIDE: B Natriuretic Peptide: 342 pg/mL — ABNORMAL HIGH (ref 0.0–100.0)

## 2016-09-14 LAB — PROTIME-INR
INR: 1.17
PROTHROMBIN TIME: 14.9 s (ref 11.4–15.2)

## 2016-09-14 MED ORDER — FUROSEMIDE 10 MG/ML IJ SOLN
40.0000 mg | Freq: Once | INTRAMUSCULAR | Status: AC
  Administered 2016-09-14: 40 mg via INTRAVENOUS
  Filled 2016-09-14: qty 4

## 2016-09-14 MED ORDER — FUROSEMIDE 40 MG PO TABS: 40.0000 mg | ORAL_TABLET | Freq: Every day | ORAL | 0 refills | Status: DC

## 2016-09-14 NOTE — ED Notes (Signed)
Left message with facility. Was sent to an extension with no answer.

## 2016-09-14 NOTE — Discharge Instructions (Signed)
Increase your furosemide (Lasix) dose to 40 mg a day for the next 10 days. I am giving you a new prescription to take during that time, before resuming your prior prescription for furosemide (Lasix) 20 mg a day

## 2016-09-14 NOTE — ED Notes (Signed)
Only able to obtain 1 set of cultures.

## 2016-09-14 NOTE — ED Provider Notes (Signed)
[place] DEPT Provider Note   CSN: [id number] Arrival date & time: 09/14/16  1747     History   Chief Complaint Chief Complaint  Patient presents with  . Weakness  . Shortness of Breath    HPI Toni [name] is a 83 y.o. female.  She was transferred from nursing home because of weakness and difficulty breathing. She actually is not complaining of anything on my exam. She was recently discharged from hospital after being diagnosed with urinary tract infection and pneumonia and is currently getting daily ceftriaxone through a PICC line. She denies fever or chills and she denies any pain anywhere. She denies nausea or vomiting.   The history is provided by the nursing home.  Weakness  Associated symptoms include shortness of breath.  Shortness of Breath     Past Medical History:  Diagnosis Date  . Diabetes mellitus without complication ([place])   . Peripheral vascular disease ([place])   . Plantar fascial fibromatosis     Patient Active Problem List   Diagnosis Date Noted  . Sepsis ([place])   . Goals of care, counseling/discussion   . Palliative care by specialist   . Streptococcal sepsis ([place]) 09/02/2016  . Streptococcal bacteremia 09/02/2016  . E. coli UTI 09/02/2016  . Acute metabolic encephalopathy [id number]  . Leukocytosis 09/02/2016  . Community acquired pneumonia 08/31/2016    Past Surgical History:  Procedure Laterality Date  . ABDOMINAL HYSTERECTOMY     partial  . ECTOPIC PREGNANCY SURGERY    . JOINT REPLACEMENT Bilateral    TKR  . TEE WITHOUT CARDIOVERSION N/A 09/07/2016   Procedure: TRANSESOPHAGEAL ECHOCARDIOGRAM (TEE);  Surgeon: Skeet Latch, MD;  Location: [facility] ENDOSCOPY;  Service: Cardiovascular;  Laterality: N/A;    OB History    No data available       Home Medications    Prior to Admission medications   Medication Sig Start Date End Date Taking? Authorizing Provider  acetaminophen (TYLENOL) 325 MG tablet Take 325-650 mg by mouth every  6 (six) hours as needed (for knee pain).    Historical Provider, MD  albuterol (PROVENTIL) (2.5 MG/3ML) 0.083% nebulizer solution Take 3 mLs (2.5 mg total) by nebulization every 4 (four) hours as needed for wheezing or shortness of breath. 09/09/16   Robbie Lis, MD  aspirin 81 MG tablet Take 81 mg by mouth daily.    Historical Provider, MD  cefTRIAXone 2 g in dextrose 5 % 50 mL Inject 2 g into the vein daily. 09/09/16 09/16/16  Robbie Lis, MD  furosemide (LASIX) 20 MG tablet Take 1 tablet (20 mg total) by mouth daily. 09/09/16   Robbie Lis, MD  guaiFENesin-dextromethorphan (ROBITUSSIN DM) 100-10 MG/5ML syrup Take 5 mLs by mouth every 4 (four) hours as needed for cough (chest congestion). 09/09/16   Robbie Lis, MD  insulin aspart (NOVOLOG) 100 UNIT/ML injection Inject 0-15 Units into the skin 3 (three) times daily with meals. 09/09/16   Robbie Lis, MD  Multiple Vitamins-Minerals (CENTRUM SILVER ADULT 50+ PO) Take 1 tablet by mouth daily.    Historical Provider, MD  Polyethyl Glycol-Propyl Glycol (SYSTANE ULTRA OP) Apply 1-2 drops to eye 2 (two) times daily as needed (for dry eyes).     Historical Provider, MD  potassium chloride (K-DUR) 10 MEQ tablet Take 1 tablet (10 mEq total) by mouth daily. 09/09/16   Robbie Lis, MD  senna (SENOKOT) 8.6 MG TABS tablet Take 1 tablet (8.6 mg total) by mouth 2 (two)  times daily. 09/09/16   Robbie Lis, MD  simvastatin (ZOCOR) 40 MG tablet Take 40 mg by mouth daily.    Historical Provider, MD  verapamil (VERELAN PM) 180 MG 24 hr capsule Take 180 mg by mouth daily.    Historical Provider, MD    Family History Family History  Problem Relation Age of Onset  . Hypertension Mother   . Hyperlipidemia Mother     before age 49  . Heart disease Father   . Hypertension Father   . Hyperlipidemia Father     before age 49  . Diabetes Sister   . Other Sister     amputation    Social History Social History  Substance Use Topics  . Smoking status: Never  Smoker  . Smokeless tobacco: Never Used  . Alcohol use No     Allergies   Patient has no known allergies.   Review of Systems Review of Systems  Respiratory: Positive for shortness of breath.   Neurological: Positive for weakness.  All other systems reviewed and are negative.    Physical Exam Updated Vital Signs BP (!) 102/52 (BP Location: Left Arm)   Pulse 79   Temp 98.6 F (37 C) (Oral)   Resp 22   SpO2 96%   Physical Exam  Nursing note and vitals reviewed.  83 year old female, resting comfortably and in no acute distress. Vital signs are Significant for tachypnea. Oxygen saturation is 96%, which is normal. Head is normocephalic and atraumatic. PERRLA, EOMI. Oropharynx is clear. Neck is nontender and supple without adenopathy or JVD. Back is nontender and there is no CVA tenderness. Lungs are clear without rales, wheezes, or rhonchi. Chest is nontender. Heart has regular rate and rhythm with frequent premature beats. There is a harsh 2/6 systolic ejection  murmur heard at the upper left sternal border. Abdomen is soft, flat, nontender without masses or hepatosplenomegaly and peristalsis is normoactive. Extremities have 2+ edema, full range of motion is present. Skin is warm and dry without rash. Neurologic: Mental status is normal, cranial nerves are intact, there are no motor or sensory deficits.  ED Treatments / Results  Labs (all labs ordered are listed, but only abnormal results are displayed) Labs Reviewed  COMPREHENSIVE METABOLIC PANEL - Abnormal; Notable for the following:       Result Value   Glucose, Bld 127 (*)    Calcium 8.4 (*)    Albumin 2.1 (*)    AST 43 (*)    All other components within normal limits  CBC WITH DIFFERENTIAL/PLATELET - Abnormal; Notable for the following:    RBC 3.76 (*)    Hemoglobin 10.2 (*)    HCT 33.7 (*)    RDW 15.8 (*)    All other components within normal limits  TROPONIN I - Abnormal; Notable for the following:     Troponin I 0.06 (*)    All other components within normal limits  BRAIN NATRIURETIC PEPTIDE - Abnormal; Notable for the following:    B Natriuretic Peptide 342.0 (*)    All other components within normal limits  TROPONIN I - Abnormal; Notable for the following:    Troponin I 0.06 (*)    All other components within normal limits  I-STAT CG4 LACTIC ACID, ED - Abnormal; Notable for the following:    Lactic Acid, Venous 2.17 (*)    All other components within normal limits  CULTURE, BLOOD (ROUTINE X 2)  CULTURE, BLOOD (ROUTINE X 2)  PROTIME-INR  URINALYSIS, ROUTINE W REFLEX MICROSCOPIC  I-STAT CG4 LACTIC ACID, ED    EKG  EKG Interpretation  Date/Time:  Tuesday September 14 2016 18:27:55 EST Ventricular Rate:  79 PR Interval:    QRS Duration: 79 QT Interval:  430 QTC Calculation: 493 R Axis:   -12 Text Interpretation:  Sinus rhythm Ventricular premature complex Prolonged PR interval Low voltage, precordial leads Borderline T abnormalities, diffuse leads Borderline prolonged QT interval When compared with ECG of 08/31/2016, Sinus rhythm has replaced Atrial fibrillation with rapid ventricular response QT has lengthened Confirmed by Roxanne Mins  MD, Dilpreet Faires ([id number]) on 09/14/2016 7:26:58 PM       Radiology Dg Chest 2 View  Result Date: 09/14/2016 CLINICAL DATA:  Shortness of breath. EXAM: CHEST  2 VIEW COMPARISON:  Single-view of the chest 09/08/2016. FINDINGS: Right PICC is in place with the tip in the mid superior vena cava. Left basilar airspace disease seen on the comparison examination has resolved. Mild right basilar atelectasis is noted. No pneumothorax. No pleural effusion. Mild, remote appearing compression fracture at the thoracolumbar junction is noted. IMPRESSION: Left basilar airspace disease seen on the comparison examination has resolved. Mild right basilar atelectasis. New right PICC in place with the tip in the mid superior vena cava. Electronically Signed   By: Inge Rise M.D.    On: 09/14/2016 18:40    Procedures Procedures (including critical care time)  Medications Ordered in ED Medications  furosemide (LASIX) injection 40 mg (not administered)     Initial Impression / Assessment and Plan / ED Course  I have reviewed the triage vital signs and the nursing notes.  Pertinent labs & imaging results that were available during my care of the patient were reviewed by me and considered in my medical decision making (see chart for details).  Report of dyspnea and weakness. Old records are reviewed, and she had group B strep bacteremia as well as Escherichia coli urinary tract infection when hospitalized February 13 through February 22. She is supposed to continue on ceftriaxone through March 1. Will repeat septic workup and check troponin and BNP.  Chest x-ray shows no evidence of pneumonia. Troponin has come back borderline elevated at 0.06. BNP is mildly elevated at 342. I suspect that she may have some mild congestive heart failure. She was kept in the ED for repeat troponin which is unchanged. No evidence of acute cardiac injury. Initial lactic acid was borderline elevated, but repeat was normal with no interventions done. Urinalysis shows no evidence of urinary tract infection. She is advised to increase her furosemide to 40 mg a day for the next 10 days. Follow-up with PCP.  Final Clinical Impressions(s) / ED Diagnoses   Final diagnoses:  Weakness  Elevated troponin I level  Elevated brain natriuretic peptide (BNP) level  Normochromic normocytic anemia    New Prescriptions Current Discharge Medication List       Delora Fuel, MD [id number]

## 2016-09-14 NOTE — ED Notes (Signed)
Attempted blood draw unsuccessful x2. Not enough blood

## 2016-09-14 NOTE — ED Notes (Signed)
Dr. Glick made aware of troponin  

## 2016-09-14 NOTE — ED Triage Notes (Signed)
Per EMS, pt from [facility] called out for weakness and shortness of breath. Pt was dx'd with UTI and pneumonia on 2/13.   Pt has been unable to do PT for the past couple of days due to weakness. Pt given 2.5 albuterol at facility for shortness of breath.   Pt has right sided PICC line. Pt is receiving rocephin at facility.  Temp 102.2 BP 100/60 CBG 155

## 2016-09-14 NOTE — ED Notes (Signed)
Pt is aware urine sample is needed. 

## 2016-09-15 NOTE — ED Notes (Signed)
[facility] arrived. Pt cleaned and change before leaving

## 2016-09-15 NOTE — ED Notes (Signed)
Pt cleaned after incontinence and changed into a new brief.

## 2016-09-17 ENCOUNTER — Encounter: Payer: Self-pay | Admitting: Internal Medicine

## 2016-09-17 ENCOUNTER — Non-Acute Institutional Stay (SKILLED_NURSING_FACILITY): Payer: Medicare Other | Admitting: Internal Medicine

## 2016-09-17 DIAGNOSIS — A409 Streptococcal sepsis, unspecified: Secondary | ICD-10-CM | POA: Diagnosis not present

## 2016-09-17 DIAGNOSIS — E669 Obesity, unspecified: Secondary | ICD-10-CM | POA: Diagnosis not present

## 2016-09-17 DIAGNOSIS — I10 Essential (primary) hypertension: Secondary | ICD-10-CM

## 2016-09-17 DIAGNOSIS — N390 Urinary tract infection, site not specified: Secondary | ICD-10-CM

## 2016-09-17 DIAGNOSIS — R5381 Other malaise: Secondary | ICD-10-CM

## 2016-09-17 DIAGNOSIS — B9620 Unspecified Escherichia coli [E. coli] as the cause of diseases classified elsewhere: Secondary | ICD-10-CM | POA: Diagnosis not present

## 2016-09-17 DIAGNOSIS — K5909 Other constipation: Secondary | ICD-10-CM | POA: Diagnosis not present

## 2016-09-17 DIAGNOSIS — I5032 Chronic diastolic (congestive) heart failure: Secondary | ICD-10-CM | POA: Diagnosis not present

## 2016-09-17 DIAGNOSIS — J189 Pneumonia, unspecified organism: Secondary | ICD-10-CM | POA: Diagnosis not present

## 2016-09-17 DIAGNOSIS — E1169 Type 2 diabetes mellitus with other specified complication: Secondary | ICD-10-CM | POA: Diagnosis not present

## 2016-09-17 NOTE — Progress Notes (Signed)
LOCATION: [name]  PCP: Elyn Peers, MD   Code Status: Full Code  Goals of care: Advanced Directive information Advanced Directives 09/14/2016  Does Patient Have a Medical Advance Directive? No  Would patient like information on creating a medical advance directive? No - Patient declined       Extended Emergency Contact Information Primary Emergency Contact: Masterson,[name] Address: [address]          [place], [place] [address] [place] Phone: [phone] Relation: Daughter Secondary Emergency Contact: Karna Christmas [place] Mobile Phone: [phone] Relation: Grandaughter   No Known Allergies  Chief Complaint  Patient presents with  . New Admit To SNF    New Admission Visit      HPI:  Patient is a 83 y.o. female seen today for short term rehabilitation post hospital admission from 08/31/2016-09/09/2016 with acute encephalopathy. She was treated for Streptococcus bacteremia, community-acquired pneumonia and Escherichia coli UTI. Her TEE was negative for vegetation. She was started on antibiotic and completed her course on 09/16/2016. She was undergoing rehabilitation at this facility and had an emergency room visit on 09/14/2016 for dyspnea. Her BNP was noted to be elevated. Her Lasix dose was increased and she was sent back to the facility to continue rehabilitation. She is seen in her room today. She has medical history of hypertension, peripheral vascular disease, diabetes.  Review of Systems:  Constitutional: Negative for fever and chills. Energy level is low.  HENT: Negative for headache, congestion,sore throat, difficulty swallowing. Positive for occasional nasal discharge.   Eyes: Negative for eye pain, blurred vision, double vision and discharge. Wears glasses. Respiratory: Negative for wheezing. Positive for occasional cough with light yellow phlegm and shortness of breath with exertion.   Cardiovascular:  Negative for chest pain, palpitations Gastrointestinal: Negative for nausea, vomiting, abdominal pain. Positive for heartburn. Last bowel movement was today. Genitourinary: Negative for dysuria Musculoskeletal: Negative for back pain, fall in the facility.  Skin: Negative for itching, rash.  Neurological: Negative for dizziness. Psychiatric/Behavioral: Negative for depression   Past Medical History:  Diagnosis Date  . Diabetes mellitus without complication ([place])   . Peripheral vascular disease ([place])   . Plantar fascial fibromatosis    Past Surgical History:  Procedure Laterality Date  . ABDOMINAL HYSTERECTOMY     partial  . ECTOPIC PREGNANCY SURGERY    . JOINT REPLACEMENT Bilateral    TKR  . TEE WITHOUT CARDIOVERSION N/A 09/07/2016   Procedure: TRANSESOPHAGEAL ECHOCARDIOGRAM (TEE);  Surgeon: Skeet Latch, MD;  Location: [facility] ENDOSCOPY;  Service: Cardiovascular;  Laterality: N/A;   Social History:   reports that she has never smoked. She has never used smokeless tobacco. She reports that she does not drink alcohol or use drugs.  Family History  Problem Relation Age of Onset  . Hypertension Mother   . Hyperlipidemia Mother     before age 49  . Heart disease Father   . Hypertension Father   . Hyperlipidemia Father     before age 65  . Diabetes Sister   . Other Sister     amputation    Medications: Allergies as of 09/17/2016   No Known Allergies     Medication List       Accurate as of 09/17/16  1:13 PM. Always use your most recent med list.          acetaminophen 325 MG tablet Commonly known as:  TYLENOL Take 650 mg by mouth every 6 (six)  hours as needed for mild pain or moderate pain.   albuterol (2.5 MG/3ML) 0.083% nebulizer solution Commonly known as:  PROVENTIL Take 3 mLs (2.5 mg total) by nebulization every 4 (four) hours as needed for wheezing or shortness of breath.   aspirin 81 MG chewable tablet Chew 81 mg by mouth every evening.   furosemide 20 MG  tablet Commonly known as:  LASIX Take 20 mg by mouth daily. Start on 09/25/16   furosemide 40 MG tablet Commonly known as:  LASIX Take 1 tablet (40 mg total) by mouth daily.   guaiFENesin-dextromethorphan 100-10 MG/5ML syrup Commonly known as:  ROBITUSSIN DM Take 5 mLs by mouth every 4 (four) hours as needed for cough (chest congestion).   heparin NICU PF 100 UNIT/ML Soln injection Inject 3 Units/mL into the vein daily. Flush picc line before and after each use.   insulin aspart 100 UNIT/ML injection Commonly known as:  novoLOG Inject 0-15 Units into the skin 3 (three) times daily with meals.   multivitamin with minerals Tabs tablet Take 1 tablet by mouth at bedtime.   potassium chloride 10 MEQ tablet Commonly known as:  K-DUR Take 1 tablet (10 mEq total) by mouth daily.   senna-docusate 8.6-50 MG tablet Commonly known as:  Senokot-S Take 1 tablet by mouth 2 (two) times daily.   simvastatin 40 MG tablet Commonly known as:  ZOCOR Take 40 mg by mouth at bedtime.   sodium chloride 0.9 % injection Inject 10 mLs into the vein daily. Flush picc line before and after each use.   SYSTANE ULTRA 0.4-0.3 % Soln Generic drug:  Polyethyl Glycol-Propyl Glycol Place 2 drops into both eyes 2 (two) times daily as needed (for dry eyes).   verapamil 180 MG 24 hr capsule Commonly known as:  VERELAN PM Take 180 mg by mouth daily.       Immunizations: Immunization History  Administered Date(s) Administered  . Influenza-Unspecified 04/18/2013  . PPD Test 09/09/2016     Physical Exam: Vitals:   09/17/16 1308  BP: 103/62  Pulse: 70  Resp: 20  Temp: (!) 96.6 F (35.9 C)  TempSrc: Oral  SpO2: 97%  Weight: 196 lb 9.6 oz (89.2 kg)  Height: 5\' 2"  (1.575 m)   Body mass index is 35.96 kg/m.  General- elderly female, Obese, in no acute distress Head- normocephalic, atraumatic Nose- no maxillary or frontal sinus tenderness, no nasal discharge Throat- moist mucus  membrane Eyes- PERRLA, EOMI, no pallor, no icterus, no discharge, normal conjunctiva, normal sclera Neck- no cervical lymphadenopathy Cardiovascular- normal [id number], systolic murmur Respiratory- bilateral clear to auscultation, no wheeze, no rhonchi, no crackles, no use of accessory muscles, on 2 L oxygen by nasal cannula Abdomen- bowel sounds present, soft, non tender Musculoskeletal- able to move all 4 extremities, generalized weakness more prominent to lower extremities, arthritis changes to her fingers, PICC line to right upper extremity, 1+ bilateral pitting leg edema Neurological- alert and oriented to person, place and time Skin- warm and dry, bruise to left arm Psychiatry- normal mood and affect    Labs reviewed: Basic Metabolic Panel:  Recent Labs  09/07/16 0439 09/07/16 0743 09/14/16 1907  NA 137 138 137  K 3.7 3.7 4.3  CL 105 102 102  CO2 26 27 30   GLUCOSE 172* 161* 127*  BUN <5* <5* 8  CREATININE 0.40* 0.41* 0.54  CALCIUM 8.3* 8.4* 8.4*   Liver Function Tests:  Recent Labs  08/31/16 1648 09/14/16 1907  AST 32 43*  ALT  23 19  ALKPHOS 168* 104  BILITOT 1.6* 1.1  PROT 7.5 6.5  ALBUMIN 1.8* 2.1*   No results for input(s): LIPASE, AMYLASE in the last 8760 hours.  Recent Labs  08/31/16 1648  AMMONIA 76*   CBC:  Recent Labs  08/31/16 1648  09/08/16 0508 09/09/16 0516 09/14/16 1907  WBC 12.2*  < > 7.2 6.4 7.0  NEUTROABS 11.1*  --   --   --  5.1  HGB 11.2*  < > 9.0* 9.2* 10.2*  HCT 36.4  < > 30.4* 30.9* 33.7*  MCV 90.5  < > 91.8 91.4 89.6  PLT 336  < > 270 257 209  < > = values in this interval not displayed. Cardiac Enzymes:  Recent Labs  09/14/16 1907 09/14/16 2234  TROPONINI 0.06* 0.06*   BNP: Invalid input(s): POCBNP CBG:  Recent Labs  09/09/16 0749 09/09/16 1114 09/09/16 1621  GLUCAP 141* 206* 168*    Radiological Exams: Dg Chest 2 View  Result Date: 09/14/2016 CLINICAL DATA:  Shortness of breath. EXAM: CHEST  2 VIEW  COMPARISON:  Single-view of the chest 09/08/2016. FINDINGS: Right PICC is in place with the tip in the mid superior vena cava. Left basilar airspace disease seen on the comparison examination has resolved. Mild right basilar atelectasis is noted. No pneumothorax. No pleural effusion. Mild, remote appearing compression fracture at the thoracolumbar junction is noted. IMPRESSION: Left basilar airspace disease seen on the comparison examination has resolved. Mild right basilar atelectasis. New right PICC in place with the tip in the mid superior vena cava. Electronically Signed   By: Inge Rise M.D.   On: 09/14/2016 18:40   Ct Head Wo Contrast  Result Date: 08/31/2016 CLINICAL DATA:  Altered mental status EXAM: CT HEAD WITHOUT CONTRAST TECHNIQUE: Contiguous axial images were obtained from the base of the skull through the vertex without intravenous contrast. COMPARISON:  None. FINDINGS: Brain: No mass lesion, intraparenchymal hemorrhage or extra-axial collection. No evidence of acute cortical infarct. There is periventricular hypoattenuation compatible with chronic microvascular disease. There is bilateral basal ganglia mineralization. Vascular: No hyperdense vessel or unexpected calcification. Skull: Normal visualized skull base, calvarium and extracranial soft tissues. Sinuses/Orbits: No sinus fluid levels or advanced mucosal thickening. No mastoid effusion. Normal orbits. IMPRESSION: Chronic microvascular ischemia without acute intracranial abnormality. Electronically Signed   By: Ulyses Jarred M.D.   On: 08/31/2016 18:27   Dg Chest Port 1 View  Result Date: 09/08/2016 CLINICAL DATA:  Initial evaluation for acute shortness of breath, history of pneumonia. EXAM: PORTABLE CHEST 1 VIEW COMPARISON:  Prior radiograph from 08/31/2016. FINDINGS: Stable cardiomegaly. Mediastinal silhouette within normal limits. Aortic atherosclerosis noted. Lungs hypoinflated. Patchy consolidation at the left lung base,  concerning for possible infiltrate. Bronchovascular crowding/ atelectasis present the right lung base as well. No pulmonary edema. Probable small left pleural effusion. No pneumothorax. No acute osseus abnormality. IMPRESSION: 1. Left basilar consolidation, concerning for possible pneumonia. 2. Probable small left pleural effusion. 3. Stable cardiomegaly without pulmonary edema. 4. Aortic atherosclerosis. Electronically Signed   By: Jeannine Boga M.D.   On: 09/08/2016 19:05   Dg Chest Portable 1 View  Result Date: 08/31/2016 CLINICAL DATA:  New onset of tremors. Altered mental status. Initial encounter. EXAM: PORTABLE CHEST 1 VIEW COMPARISON:  Chest radiograph performed 03/31/2011 FINDINGS: The lungs are hypoexpanded. Mild patchy left central airspace opacities may reflect pneumonia, depending on the patient's symptoms. There is no evidence of pleural effusion or pneumothorax. The cardiomediastinal silhouette is borderline normal  in size. No acute osseous abnormalities are seen. IMPRESSION: Lungs hypoexpanded. Mild patchy left central airspace opacities may reflect mild pneumonia, depending on the patient's symptoms. Electronically Signed   By: Garald Balding M.D.   On: 08/31/2016 17:14     Assessment/Plan  Physical deconditioning With generalized weakness from recent hospitalization.Will have her work with physical therapy and occupational therapy team to help with gait training and muscle strengthening exercises.fall precautions. Skin care. Encourage to be out of bed.   Streptococcus bacteremia Remains afebrile. Completed her antibiotic course yesterday. Will DC PICC line. Monitor clinically  Community-acquired pneumonia Has completed her antibiotic course. Continues to have some cough. Will write for incentive spiral meter and encouraged patient to use it. Currently on oxygen by nasal cannula, to wean it off as tolerated. Continue guaifenesin as needed  Escherichia coli UTI Currently  asymptomatic and has completed her antibiotic. Perineal hygiene and hydration to be maintained.  Chronic diastolic congestive heart failure Reviewed echocardiogram from 09/03/2016 showing ejection fraction 60-65 percent. Recent ED visit for dyspnea with BNP elevated on lab review. Her Lasix was then increased from 20 mg daily to 40 mg daily. Continue Lasix 40 mg daily for now with potassium supplement and monitor her breathing and BMP. Continue oxygen by nasal cannula for now. Check weight 3 days a week.  Chronic constipation Continue Senokot-S twice a day.  Diabetes mellitus No results found for: HGBA1C Currently on NovoLog sliding scale insulin. Check hemoglobin A1c as none available for review. Continue statin and aspirin.  Hypertension Continue her verapamil and aspirin, no changes made   Goals of care: short term rehabilitation   Labs/tests ordered: CBC, CMP, A1c 09/20/2016, BMP 09/27/2016   Family/ staff Communication: reviewed care plan with patient and nursing supervisor    Blanchie Serve, MD Internal Medicine [place], [place] [address] Cell Phone (Monday-Friday 8 am - 5 pm): [phone] On Call: [phone] and follow prompts after 5 pm and on weekends Office Phone: [phone] Office Fax: [phone]

## 2016-09-19 LAB — CULTURE, BLOOD (ROUTINE X 2): Culture: NO GROWTH

## 2016-09-20 LAB — BASIC METABOLIC PANEL
BUN: 10 mg/dL (ref 4–21)
CREATININE: 0.3 mg/dL — AB (ref 0.5–1.1)
Glucose: 98 mg/dL
Potassium: 4 mmol/L (ref 3.4–5.3)
SODIUM: 141 mmol/L (ref 137–147)

## 2016-09-20 LAB — HEPATIC FUNCTION PANEL
ALT: 17 U/L (ref 7–35)
AST: 25 U/L (ref 13–35)
Alkaline Phosphatase: 87 U/L (ref 25–125)
Bilirubin, Total: 0.6 mg/dL

## 2016-09-20 LAB — CBC AND DIFFERENTIAL
HCT: 32 % — AB (ref 36–46)
HEMOGLOBIN: 9.6 g/dL — AB (ref 12.0–16.0)
NEUTROS ABS: 33 /uL
Platelets: 151 10*3/uL (ref 150–399)
WBC: 5.6 10^3/mL

## 2016-09-20 LAB — HEMOGLOBIN A1C: Hemoglobin A1C: 7.2

## 2016-09-21 ENCOUNTER — Non-Acute Institutional Stay (SKILLED_NURSING_FACILITY): Payer: Medicare Other | Admitting: Family

## 2016-09-21 DIAGNOSIS — D509 Iron deficiency anemia, unspecified: Secondary | ICD-10-CM

## 2016-09-21 DIAGNOSIS — E8809 Other disorders of plasma-protein metabolism, not elsewhere classified: Secondary | ICD-10-CM

## 2016-09-21 DIAGNOSIS — R600 Localized edema: Secondary | ICD-10-CM

## 2016-09-21 DIAGNOSIS — E440 Moderate protein-calorie malnutrition: Secondary | ICD-10-CM

## 2016-09-21 NOTE — Progress Notes (Signed)
Location:  [place] Room Number: 907  Place of Service:  SNF (31) Provider:  Jeriann Sayres FNP-C   Elyn Peers, MD  Patient Care Team: Lucianne Lei, MD as PCP - General (Family Medicine) Inocencio Homes, DPM as Attending Physician (Podiatry)  Extended Emergency Contact Information Primary Emergency Contact: [facility] Address: [address]          [place], [place] [address] [place] Phone: [phone] Relation: Daughter Secondary Emergency Contact: Karna Christmas [place] Mobile Phone: [phone] Relation: Grandaughter  Code Status:  Full Code  Goals of care: Advanced Directive information Advanced Directives 09/14/2016  Does Patient Have a Medical Advance Directive? No  Would patient like information on creating a medical advance directive? No - Patient declined     Chief Complaint  Patient presents with  . Acute Visit    abnormal lab results     HPI:  Pt is a 83 y.o. female seen today at [facility] for an acute visit for evaluation of abnormal lab results.she has a medical history of Type 2 DM, PVD, obesity among other conditions. She is seen in her room today. She denies any acute issues this visit. He recent lab results showed TP 5.4, Alb 2.05, Hgb 9.6, Hgb A1C 7.2 ( 09/20/2016).    Past Medical History:  Diagnosis Date  . Diabetes mellitus without complication ([place])   . Peripheral vascular disease ([place])   . Plantar fascial fibromatosis    Past Surgical History:  Procedure Laterality Date  . ABDOMINAL HYSTERECTOMY     partial  . ECTOPIC PREGNANCY SURGERY    . JOINT REPLACEMENT Bilateral    TKR  . TEE WITHOUT CARDIOVERSION N/A 09/07/2016   Procedure: TRANSESOPHAGEAL ECHOCARDIOGRAM (TEE);  Surgeon: Skeet Latch, MD;  Location: [place];  Service: Cardiovascular;  Laterality: N/A;    No Known Allergies  Allergies as of 09/21/2016   No Known Allergies       Medication List       Accurate as of 09/21/16  3:50 PM. Always use your most recent med list.          acetaminophen 325 MG tablet Commonly known as:  TYLENOL Take 650 mg by mouth every 6 (six) hours as needed for mild pain or moderate pain.   albuterol (2.5 MG/3ML) 0.083% nebulizer solution Commonly known as:  PROVENTIL Take 3 mLs (2.5 mg total) by nebulization every 4 (four) hours as needed for wheezing or shortness of breath.   aspirin 81 MG chewable tablet Chew 81 mg by mouth every evening.   furosemide 20 MG tablet Commonly known as:  LASIX Take 20 mg by mouth daily. Start on 09/25/16   furosemide 40 MG tablet Commonly known as:  LASIX Take 1 tablet (40 mg total) by mouth daily.   guaiFENesin-dextromethorphan 100-10 MG/5ML syrup Commonly known as:  ROBITUSSIN DM Take 5 mLs by mouth every 4 (four) hours as needed for cough (chest congestion).   insulin aspart 100 UNIT/ML injection Commonly known as:  novoLOG Inject 0-15 Units into the skin 3 (three) times daily with meals.   multivitamin with minerals Tabs tablet Take 1 tablet by mouth at bedtime.   potassium chloride 10 MEQ tablet Commonly known as:  K-DUR Take 1 tablet (10 mEq total) by mouth daily.   senna-docusate 8.6-50 MG tablet Commonly known as:  Senokot-S Take 1 tablet by mouth 2 (two) times daily.   simvastatin 40 MG tablet Commonly  known as:  ZOCOR Take 40 mg by mouth at bedtime.   SYSTANE ULTRA 0.4-0.3 % Soln Generic drug:  Polyethyl Glycol-Propyl Glycol Place 2 drops into both eyes 2 (two) times daily as needed (for dry eyes).   verapamil 180 MG 24 hr capsule Commonly known as:  VERELAN PM Take 180 mg by mouth daily.       Review of Systems  Constitutional: Negative for activity change, appetite change, chills, fatigue and fever.  HENT: Negative for congestion, rhinorrhea, sinus pain, sinus pressure, sneezing and sore throat.   Eyes: Negative.   Respiratory: Negative for cough, chest  tightness, shortness of breath and wheezing.   Cardiovascular: Positive for leg swelling. Negative for chest pain and palpitations.  Gastrointestinal: Negative for abdominal distention, abdominal pain, constipation, diarrhea, nausea and vomiting.  Genitourinary: Negative for dysuria, flank pain, frequency and urgency.  Musculoskeletal: Positive for gait problem.  Skin: Negative for color change, pallor and rash.  Neurological: Negative for dizziness, seizures, syncope, light-headedness, numbness and headaches.  Psychiatric/Behavioral: Negative for agitation, confusion, hallucinations and sleep disturbance. The patient is not nervous/anxious.     Immunization History  Administered Date(s) Administered  . Influenza-Unspecified 04/18/2013  . PPD Test 09/09/2016   Pertinent  Health Maintenance Due  Topic Date Due  . HEMOGLOBIN A1C  Nov 16, 1934  . FOOT EXAM  12/20/1944  . OPHTHALMOLOGY EXAM  12/20/1944  . URINE MICROALBUMIN  12/20/1944  . DEXA SCAN  12/21/1999  . PNA vac Low Risk Adult (1 of 2 - PCV13) 12/21/1999  . INFLUENZA VACCINE  02/17/2016   Fall Risk  03/15/2016  Falls in the past year? No    Vitals:   09/21/16 1130  BP: 103/62  Pulse: 70  Resp: 20  Temp: (!) 96.5 F (35.8 C)  SpO2: 97%  Weight: 200 lb 3.2 oz (90.8 kg)  Height: 5\' 2"  (1.575 m)   Body mass index is 36.62 kg/m. Physical Exam  Constitutional: She is oriented to person, place, and time. She appears well-developed and well-nourished. No distress.  HENT:  Head: Normocephalic.  Mouth/Throat: Oropharynx is clear and moist. No oropharyngeal exudate.  Eyes: Conjunctivae and EOM are normal. Pupils are equal, round, and reactive to light. Right eye exhibits no discharge. Left eye exhibits no discharge. No scleral icterus.  Neck: Normal range of motion. No JVD present. No thyromegaly present.  Cardiovascular: Normal rate, regular rhythm, normal heart sounds and intact distal pulses.  Exam reveals no gallop and no  friction rub.   No murmur heard. Pulmonary/Chest: Effort normal and breath sounds normal. No respiratory distress. She has no wheezes. She has no rales.  Abdominal: Soft. Bowel sounds are normal. She exhibits no distension. There is no tenderness. There is no rebound and no guarding.  Musculoskeletal: She exhibits no tenderness or deformity.  Unsteady gait. Moves x 4 extremities. Bilateral 2+ edema to lower extremities.   Lymphadenopathy:    She has no cervical adenopathy.  Neurological: She is oriented to person, place, and time.  Skin: Skin is warm and dry. No rash noted. No erythema. No pallor.  Psychiatric: She has a normal mood and affect.    Labs reviewed:  Recent Labs  09/07/16 0439 09/07/16 0743 09/14/16 1907 09/20/16  NA 137 138 137 141  K 3.7 3.7 4.3 4.0  CL 105 102 102  --   CO2 26 27 30   --   GLUCOSE 172* 161* 127*  --   BUN <5* <5* 8 10  CREATININE 0.40* 0.41* 0.54  0.3*  CALCIUM 8.3* 8.4* 8.4*  --     Recent Labs  08/31/16 1648 09/14/16 1907 09/20/16  AST 32 43* 25  ALT 23 19 17   ALKPHOS 168* 104 87  BILITOT 1.6* 1.1  --   PROT 7.5 6.5  --   ALBUMIN 1.8* 2.1*  --     Recent Labs  08/31/16 1648  09/08/16 0508 09/09/16 0516 09/14/16 1907 09/20/16  WBC 12.2*  < > 7.2 6.4 7.0 5.6  NEUTROABS 11.1*  --   --   --  5.1 33  HGB 11.2*  < > 9.0* 9.2* 10.2* 9.6*  HCT 36.4  < > 30.4* 30.9* 33.7* 32*  MCV 90.5  < > 91.8 91.4 89.6  --   PLT 336  < > 270 257 209 151  < > = values in this interval not displayed. No results found for: TSH Lab Results  Component Value Date   HGBA1C 7.2 09/20/2016    Assessment/Plan 1. Moderate protein-calorie malnutrition TP 5.4 ( 09/20/2016) RD consult for supplements. Recheck BMP 09/28/2016.   2. Hypoalbuminemia Alb 2.05 ( 09/20/2016). RD consult for supplements. Recheck BMP 09/28/2016.   3. Iron deficiency anemia  Hgb 9.6, HCT 32.1 ( 09/20/2016). Previous Hgb 9.0, 9.2 and latest 10.2. Continue to monitor. Recheck CBC  09/28/2016.   4. Edema  Bilateral lower extremities 2+ continue on Furosemide 40 mg Tablet. Apply bilateral knee high ted hose on in the morning and off at bedtime. Facility Nurse to  Measure patient's legs  for appropriate size.    Family/ staff Communication: Reviewed plan of care with patient and facility Nurse supervisor.   Labs/tests ordered: CBC, BMP 09/28/2016

## 2016-09-28 LAB — BASIC METABOLIC PANEL
BUN: 10 mg/dL (ref 4–21)
Creatinine: 0.4 mg/dL — AB (ref 0.5–1.1)
GLUCOSE: 133 mg/dL
Potassium: 4.1 mmol/L (ref 3.4–5.3)
SODIUM: 140 mmol/L (ref 137–147)

## 2016-09-28 LAB — CBC AND DIFFERENTIAL
HEMATOCRIT: 37 % (ref 36–46)
Hemoglobin: 10.8 g/dL — AB (ref 12.0–16.0)
Platelets: 230 10*3/uL (ref 150–399)
WBC: 5.9 10*3/mL

## 2016-10-01 ENCOUNTER — Encounter: Payer: Self-pay | Admitting: Family

## 2016-10-01 ENCOUNTER — Non-Acute Institutional Stay (SKILLED_NURSING_FACILITY): Payer: Medicare Other | Admitting: Family

## 2016-10-01 DIAGNOSIS — R600 Localized edema: Secondary | ICD-10-CM | POA: Diagnosis not present

## 2016-10-01 DIAGNOSIS — E1100 Type 2 diabetes mellitus with hyperosmolarity without nonketotic hyperglycemic-hyperosmolar coma (NKHHC): Secondary | ICD-10-CM | POA: Diagnosis not present

## 2016-10-01 NOTE — Progress Notes (Signed)
Location:  [place] Room Number: 907 Place of Service:  SNF (31) Provider:  Alanii Ramer FNP-C   Elyn Peers, MD  Patient Care Team: Lucianne Lei, MD as PCP - General (Family Medicine) Inocencio Homes, DPM as Attending Physician (Podiatry)  Extended Emergency Contact Information Primary Emergency Contact: [facility] Address: [address]          [place], [place] [address] [place] Phone: [phone] Relation: Daughter Secondary Emergency Contact: Karna Christmas [place] Mobile Phone: [phone] Relation: Grandaughter  Code Status:  Full Code  Goals of care: Advanced Directive information Advanced Directives 10/01/2016  Does Patient Have a Medical Advance Directive? No  Would patient like information on creating a medical advance directive? No - Patient declined     Chief Complaint  Patient presents with  . Acute Visit    high blood sugars    HPI:  Pt is a 83 y.o. female seen today at [facility] for an acute visit for evaluation of abnormal lab results.she has a medical history of Type 2 DM, PVD, obesity among other conditions. She is seen in her room today per facility Nurse request.Facility Nurse reports patient's CBG's have been elevated requiring coverage ost of the time.Patient's daughter states patient was taking glipizide 5 mg Tablet at home prior to recent hospital admission.She denies any signs of hyperglycemia. Her recent  Hgb A1C 7.2 ( 09/20/2016).Facility CBG log reviewed CBG ranging in the 100-190's with occasional 200's in the morning; 110's-200's in the afternoon.previous lower extremities swelling has improved.     Past Medical History:  Diagnosis Date  . Diabetes mellitus without complication ([place])   . Peripheral vascular disease ([place])   . Plantar fascial fibromatosis    Past Surgical History:  Procedure Laterality Date  . ABDOMINAL HYSTERECTOMY     partial  .  ECTOPIC PREGNANCY SURGERY    . JOINT REPLACEMENT Bilateral    TKR  . TEE WITHOUT CARDIOVERSION N/A 09/07/2016   Procedure: TRANSESOPHAGEAL ECHOCARDIOGRAM (TEE);  Surgeon: Skeet Latch, MD;  Location: [place];  Service: Cardiovascular;  Laterality: N/A;    No Known Allergies  Allergies as of 10/01/2016   No Known Allergies     Medication List       Accurate as of 10/01/16  5:43 PM. Always use your most recent med list.          acetaminophen 325 MG tablet Commonly known as:  TYLENOL Take 650 mg by mouth every 6 (six) hours as needed for mild pain or moderate pain.   albuterol (2.5 MG/3ML) 0.083% nebulizer solution Commonly known as:  PROVENTIL Take 3 mLs (2.5 mg total) by nebulization every 4 (four) hours as needed for wheezing or shortness of breath.   aspirin 81 MG chewable tablet Chew 81 mg by mouth every evening.   furosemide 20 MG tablet Commonly known as:  LASIX Take 20 mg by mouth daily. Start on 09/25/16   guaiFENesin-dextromethorphan 100-10 MG/5ML syrup Commonly known as:  ROBITUSSIN DM Take 5 mLs by mouth every 4 (four) hours as needed for cough (chest congestion).   insulin aspart 100 UNIT/ML injection Commonly known as:  novoLOG Inject 0-15 Units into the skin 3 (three) times daily with meals.   multivitamin with minerals Tabs tablet Take 1 tablet by mouth at bedtime.   potassium chloride 10 MEQ tablet Commonly known as:  K-DUR Take 1 tablet (10 mEq total) by mouth daily.   senna-docusate  8.6-50 MG tablet Commonly known as:  Senokot-S Take 1 tablet by mouth 2 (two) times daily.   simvastatin 40 MG tablet Commonly known as:  ZOCOR Take 40 mg by mouth at bedtime.   SYSTANE ULTRA 0.4-0.3 % Soln Generic drug:  Polyethyl Glycol-Propyl Glycol Place 2 drops into both eyes 2 (two) times daily as needed (for dry eyes).   verapamil 180 MG 24 hr capsule Commonly known as:  VERELAN PM Take 180 mg by mouth daily.       Review of Systems    Constitutional: Negative for activity change, appetite change, chills, fatigue and fever.  HENT: Negative for congestion, rhinorrhea, sinus pain, sinus pressure, sneezing and sore throat.   Eyes: Negative.   Respiratory: Negative for cough, chest tightness, shortness of breath and wheezing.   Cardiovascular: Positive for leg swelling. Negative for chest pain and palpitations.  Gastrointestinal: Negative for abdominal distention, abdominal pain, constipation, diarrhea, nausea and vomiting.  Endocrine: Negative for cold intolerance, heat intolerance, polydipsia, polyphagia and polyuria.  Genitourinary: Negative for dysuria, flank pain, frequency and urgency.  Musculoskeletal: Positive for gait problem.  Skin: Negative for color change, pallor and rash.  Neurological: Negative for dizziness, seizures, syncope, light-headedness, numbness and headaches.  Psychiatric/Behavioral: Negative for agitation, confusion, hallucinations and sleep disturbance. The patient is not nervous/anxious.     Immunization History  Administered Date(s) Administered  . Influenza-Unspecified 04/18/2013  . PPD Test 09/09/2016   Pertinent  Health Maintenance Due  Topic Date Due  . FOOT EXAM  12/20/1944  . OPHTHALMOLOGY EXAM  12/20/1944  . URINE MICROALBUMIN  12/20/1944  . DEXA SCAN  12/21/1999  . PNA vac Low Risk Adult (1 of 2 - PCV13) 12/21/1999  . INFLUENZA VACCINE  02/17/2016  . HEMOGLOBIN A1C  03/23/2017   Fall Risk  03/15/2016  Falls in the past year? No    Vitals:   10/01/16 1231  BP: 111/68  Pulse: 76  Resp: 19  Temp: 98 F (36.7 C)  TempSrc: Oral  SpO2: 97%  Weight: 193 lb 12.8 oz (87.9 kg)  Height: 5\' 2"  (1.575 m)   Body mass index is 35.45 kg/m. Physical Exam  Constitutional: She is oriented to person, place, and time. She appears well-developed and well-nourished. No distress.  HENT:  Head: Normocephalic.  Mouth/Throat: Oropharynx is clear and moist. No oropharyngeal exudate.  Eyes:  Conjunctivae and EOM are normal. Pupils are equal, round, and reactive to light. Right eye exhibits no discharge. Left eye exhibits no discharge. No scleral icterus.  Neck: Normal range of motion. No JVD present. No thyromegaly present.  Cardiovascular: Normal rate, regular rhythm, normal heart sounds and intact distal pulses.  Exam reveals no gallop and no friction rub.   No murmur heard. Pulmonary/Chest: Effort normal and breath sounds normal. No respiratory distress. She has no wheezes. She has no rales.  Abdominal: Soft. Bowel sounds are normal. She exhibits no distension. There is no tenderness. There is no rebound and no guarding.  Musculoskeletal: She exhibits no tenderness or deformity.  Unsteady gait. Moves x 4 extremities. Bilateral trace -1 + edema to lower extremities.   Lymphadenopathy:    She has no cervical adenopathy.  Neurological: She is oriented to person, place, and time.  Skin: Skin is warm and dry. No rash noted. No erythema. No pallor.  Psychiatric: She has a normal mood and affect.    Labs reviewed:  Recent Labs  09/07/16 0439 09/07/16 0743 09/14/16 1907 09/20/16 09/28/16  NA 137 138 137  141 140  K 3.7 3.7 4.3 4.0 4.1  CL 105 102 102  --   --   CO2 26 27 30   --   --   GLUCOSE 172* 161* 127*  --   --   BUN <5* <5* 8 10 10   CREATININE 0.40* 0.41* 0.54 0.3* 0.4*  CALCIUM 8.3* 8.4* 8.4*  --   --     Recent Labs  08/31/16 1648 09/14/16 1907 09/20/16  AST 32 43* 25  ALT 23 19 17   ALKPHOS 168* 104 87  BILITOT 1.6* 1.1  --   PROT 7.5 6.5  --   ALBUMIN 1.8* 2.1*  --     Recent Labs  08/31/16 1648  09/08/16 0508 09/09/16 0516 09/14/16 1907 09/20/16 09/28/16  WBC 12.2*  < > 7.2 6.4 7.0 5.6 5.9  NEUTROABS 11.1*  --   --   --  5.1 33  --   HGB 11.2*  < > 9.0* 9.2* 10.2* 9.6* 10.8*  HCT 36.4  < > 30.4* 30.9* 33.7* 32* 37  MCV 90.5  < > 91.8 91.4 89.6  --   --   PLT 336  < > 270 257 209 151 230  < > = values in this interval not displayed. No results  found for: TSH Lab Results  Component Value Date   HGBA1C 7.2 09/20/2016   Assessment/Plan  1. Type 2 DM  CBG's ranging in the100-190's with occasional 200's in the morning; 110's-200's in the afternoon. Discontinue current Novolog per SSI then start Novolog 5 units SQ three times with meals for CBG's > 150. Restart her home Glipizide 5 mg Tablet daily per patient's daughter request.   2. Edema  Has improve bilateral lower extremities trace-1+ continue on Furosemide 40 mg Tablet and bilateral knee high ted hose on in the morning and off at bedtime.  Family/ staff Communication: Reviewed plan of care with patient and facility Nurse supervisor.   Labs/tests ordered: None

## 2016-10-07 ENCOUNTER — Non-Acute Institutional Stay (SKILLED_NURSING_FACILITY): Payer: Medicare Other | Admitting: Family

## 2016-10-07 DIAGNOSIS — R600 Localized edema: Secondary | ICD-10-CM

## 2016-10-07 NOTE — Progress Notes (Signed)
Location:  [place] Room Number: 907 P  Place of Service:  SNF (31) Provider:  Dinah Ngetich FNP-C   Elyn Peers, MD  Patient Care Team: Lucianne Lei, MD as PCP - General (Family Medicine) Inocencio Homes, DPM as Attending Physician (Podiatry)  Extended Emergency Contact Information Primary Emergency Contact: [facility] Address: [address]          [place], [place] [address] [place] Phone: [phone] Relation: Daughter Secondary Emergency Contact: Karna Christmas [place] Mobile Phone: [phone] Relation: Grandaughter  Code Status:  Full Code  Goals of care: Advanced Directive information Advanced Directives 10/01/2016  Does Patient Have a Medical Advance Directive? No  Would patient like information on creating a medical advance directive? No - Patient declined     Chief Complaint  Patient presents with  . Acute Visit    edema     HPI:  Pt is a 83 y.o. female seen today at [facility] for an acute visit for evaluation of abnormal lab results.she has a medical history of Type 2 DM, PVD, obesity among other conditions. She is seen in her room today per facility Nurse request.Facility Nurse reports patient's has worsening lower extremities edema. She denies any fever, chills, cough or wheezing. No recent weight changes noted.    Past Medical History:  Diagnosis Date  . Diabetes mellitus without complication ([place])   . Peripheral vascular disease ([place])   . Plantar fascial fibromatosis    Past Surgical History:  Procedure Laterality Date  . ABDOMINAL HYSTERECTOMY     partial  . ECTOPIC PREGNANCY SURGERY    . JOINT REPLACEMENT Bilateral    TKR  . TEE WITHOUT CARDIOVERSION N/A 09/07/2016   Procedure: TRANSESOPHAGEAL ECHOCARDIOGRAM (TEE);  Surgeon: Skeet Latch, MD;  Location: [place];  Service: Cardiovascular;  Laterality: N/A;    No Known Allergies  Allergies as of  10/07/2016   No Known Allergies     Medication List       Accurate as of 10/07/16  2:13 PM. Always use your most recent med list.          acetaminophen 325 MG tablet Commonly known as:  TYLENOL Take 650 mg by mouth every 6 (six) hours as needed for mild pain or moderate pain.   albuterol (2.5 MG/3ML) 0.083% nebulizer solution Commonly known as:  PROVENTIL Take 3 mLs (2.5 mg total) by nebulization every 4 (four) hours as needed for wheezing or shortness of breath.   aspirin 81 MG chewable tablet Chew 81 mg by mouth every evening.   furosemide 20 MG tablet Commonly known as:  LASIX Take 20 mg by mouth daily. Start on 09/25/16   glipiZIDE 5 MG 24 hr tablet Commonly known as:  GLUCOTROL XL Take 5 mg by mouth daily with breakfast.   guaiFENesin-dextromethorphan 100-10 MG/5ML syrup Commonly known as:  ROBITUSSIN DM Take 5 mLs by mouth every 4 (four) hours as needed for cough (chest congestion).   insulin aspart 100 UNIT/ML injection Commonly known as:  novoLOG Inject 0-15 Units into the skin 3 (three) times daily with meals.   multivitamin with minerals Tabs tablet Take 1 tablet by mouth at bedtime.   potassium chloride 10 MEQ tablet Commonly known as:  K-DUR Take 1 tablet (10 mEq total) by mouth daily.   senna-docusate 8.6-50 MG tablet Commonly known as:  Senokot-S Take 1 tablet by mouth 2 (two) times daily.   simvastatin 40 MG  tablet Commonly known as:  ZOCOR Take 40 mg by mouth at bedtime.   SYSTANE ULTRA 0.4-0.3 % Soln Generic drug:  Polyethyl Glycol-Propyl Glycol Place 2 drops into both eyes 2 (two) times daily as needed (for dry eyes).   verapamil 180 MG 24 hr capsule Commonly known as:  VERELAN PM Take 180 mg by mouth daily.       Review of Systems  Constitutional: Negative for activity change, appetite change, chills, fatigue and fever.  HENT: Negative for congestion, rhinorrhea, sinus pain, sinus pressure, sneezing and sore throat.   Eyes:  Negative.   Respiratory: Negative for cough, chest tightness, shortness of breath and wheezing.   Cardiovascular: Positive for leg swelling. Negative for chest pain and palpitations.  Gastrointestinal: Negative for abdominal distention, abdominal pain, constipation, diarrhea, nausea and vomiting.  Genitourinary: Negative for dysuria, flank pain, frequency and urgency.  Musculoskeletal: Positive for gait problem.  Skin: Negative for color change, pallor and rash.  Neurological: Negative for dizziness, seizures, syncope, light-headedness, numbness and headaches.  Psychiatric/Behavioral: Negative for agitation, confusion, hallucinations and sleep disturbance. The patient is not nervous/anxious.     Immunization History  Administered Date(s) Administered  . Influenza-Unspecified 04/18/2013  . PPD Test 09/09/2016   Pertinent  Health Maintenance Due  Topic Date Due  . FOOT EXAM  12/20/1942  . OPHTHALMOLOGY EXAM  12[birth year]20/1942  . URINE MICROALBUMIN  12/20/1942  . DEXA SCAN  12/21/1999  . PNA vac Low Risk Adult (1 of 2 - PCV13) 12/21/1999  . INFLUENZA VACCINE  02/17/2016  . HEMOGLOBIN A1C  03/23/2017   Fall Risk  03/15/2016  Falls in the past year? No    Vitals:   10/07/16 1145  BP: 121/79  Pulse: 74  Resp: 20  Temp: 98.2 F (36.8 C)  SpO2: 97%  Weight: 193 lb 9.6 oz (87.8 kg)  Height: 5\' 2"  (1.575 m)   Body mass index is 35.41 kg/m. Physical Exam  Constitutional: She is oriented to person, place, and time. She appears well-developed and well-nourished. No distress.  HENT:  Head: Normocephalic.  Mouth/Throat: Oropharynx is clear and moist. No oropharyngeal exudate.  Eyes: Conjunctivae and EOM are normal. Pupils are equal, round, and reactive to light. Right eye exhibits no discharge. Left eye exhibits no discharge. No scleral icterus.  Neck: Normal range of motion. No JVD present. No thyromegaly present.  Cardiovascular: Normal rate, regular rhythm, normal heart sounds and  intact distal pulses.  Exam reveals no gallop and no friction rub.   No murmur heard. Pulmonary/Chest: Effort normal and breath sounds normal. No respiratory distress. She has no wheezes. She has no rales.  Abdominal: Soft. Bowel sounds are normal. She exhibits no distension. There is no tenderness. There is no rebound and no guarding.  Musculoskeletal: She exhibits no tenderness or deformity.  Unsteady gait. Moves x 4 extremities. Bilateral trace 2-3 + edema to lower extremities.Ted hose in place.    Lymphadenopathy:    She has no cervical adenopathy.  Neurological: She is oriented to person, place, and time.  Skin: Skin is warm and dry. No rash noted. No erythema. No pallor.  Psychiatric: She has a normal mood and affect.    Labs reviewed:  Recent Labs  09/07/16 0439 09/07/16 0743 09/14/16 1907 09/20/16 09/28/16  NA 137 138 137 141 140  K 3.7 3.7 4.3 4.0 4.1  CL 105 102 102  --   --   CO2 26 27 30   --   --   GLUCOSE 172*  161* 127*  --   --   BUN <5* <5* 8 10 10   CREATININE 0.40* 0.41* 0.54 0.3* 0.4*  CALCIUM 8.3* 8.4* 8.4*  --   --     Recent Labs  08/31/16 1648 09/14/16 1907 09/20/16  AST 32 43* 25  ALT 23 19 17   ALKPHOS 168* 104 87  BILITOT 1.6* 1.1  --   PROT 7.5 6.5  --   ALBUMIN 1.8* 2.1*  --     Recent Labs  08/31/16 1648  09/08/16 0508 09/09/16 0516 09/14/16 1907 09/20/16 09/28/16  WBC 12.2*  < > 7.2 6.4 7.0 5.6 5.9  NEUTROABS 11.1*  --   --   --  5.1 33  --   HGB 11.2*  < > 9.0* 9.2* 10.2* 9.6* 10.8*  HCT 36.4  < > 30.4* 30.9* 33.7* 32* 37  MCV 90.5  < > 91.8 91.4 89.6  --   --   PLT 336  < > 270 257 209 151 230  < > = values in this interval not displayed. No results found for: TSH Lab Results  Component Value Date   HGBA1C 7.2 09/20/2016   Assessment/Plan  Edema   bilateral lower extremities 2-3+ increase Furosemide to 40 mg Tablet daily.increase KCL to 20 meq Tablet daily.continue on bilateral knee high ted hose on in the morning and off at  bedtime.BMP 10/11/2016.   Family/ staff Communication: Reviewed plan of care with patient and facility Nurse supervisor.   Labs/tests ordered: None

## 2016-10-11 LAB — BASIC METABOLIC PANEL
BUN: 10 mg/dL (ref 4–21)
Creatinine: 0.4 mg/dL — AB (ref 0.5–1.1)
GLUCOSE: 78 mg/dL
POTASSIUM: 4.2 mmol/L (ref 3.4–5.3)
SODIUM: 141 mmol/L (ref 137–147)

## 2016-10-21 ENCOUNTER — Non-Acute Institutional Stay (SKILLED_NURSING_FACILITY): Payer: Medicare Other | Admitting: Family

## 2016-10-21 ENCOUNTER — Encounter: Payer: Self-pay | Admitting: Family

## 2016-10-21 DIAGNOSIS — E119 Type 2 diabetes mellitus without complications: Secondary | ICD-10-CM

## 2016-10-21 DIAGNOSIS — E782 Mixed hyperlipidemia: Secondary | ICD-10-CM | POA: Diagnosis not present

## 2016-10-21 DIAGNOSIS — I10 Essential (primary) hypertension: Secondary | ICD-10-CM

## 2016-10-21 NOTE — Progress Notes (Signed)
Location:  [place] Room Number: 907 Place of Service:  SNF [id number]) Provider:  Marlowe Sax, NP  Elyn Peers, MD  Patient Care Team: Lucianne Lei, MD as PCP - General (Family Medicine) Inocencio Homes, DPM as Attending Physician (Podiatry)  Extended Emergency Contact Information Primary Emergency Contact: Noel Christmas Address: [address]          [place], [place] [address] [place] Phone: [phone] Relation: Daughter Secondary Emergency Contact: Karna Christmas [place] Mobile Phone: [phone] Relation: Grandaughter  Code Status:  Full Code Goals of care: Advanced Directive information Advanced Directives 10/21/2016  Does Patient Have a Medical Advance Directive? No  Would patient like information on creating a medical advance directive? -     Chief Complaint  Patient presents with  . Medical Management of Chronic Issues    Routine Visit    HPI:  Pt is a 83 y.o. female seen today for medical management of chronic diseases.she has a medical history of HTN, Hyperlipidemia, Type 2 DM, Obesity among other conditions. She is seen in her room today.she denies any acute issues this visit. Of note she is here for short term rehabilitation post hospital admission from 08/31/2016-09/09/2016 for acute encephalopathy due to UTI and CAP. She continues to worked well with Therapy. She states went home with Therapist prior to visit to assess her home living environment in preparation for discharge process. She states will need her carpet removed and replaced with wood for easy use of ambulatory devices. She has had no recent fall episodes, weight changes or acute illness. Facility Nurse reports no new concerns. Her lower extremities edema has improved on diuretics and compression stockings.       Past Medical History:  Diagnosis Date  . Diabetes mellitus without complication ([place])   . Peripheral vascular disease  ([place])   . Plantar fascial fibromatosis    Past Surgical History:  Procedure Laterality Date  . ABDOMINAL HYSTERECTOMY     partial  . ECTOPIC PREGNANCY SURGERY    . JOINT REPLACEMENT Bilateral    TKR  . TEE WITHOUT CARDIOVERSION N/A 09/07/2016   Procedure: TRANSESOPHAGEAL ECHOCARDIOGRAM (TEE);  Surgeon: Skeet Latch, MD;  Location: [facility];  Service: Cardiovascular;  Laterality: N/A;    No Known Allergies  Allergies as of 10/21/2016   No Known Allergies     Medication List       Accurate as of 10/21/16 11:31 AM. Always use your most recent med list.          acetaminophen 325 MG tablet Commonly known as:  TYLENOL Take 650 mg by mouth every 6 (six) hours as needed for moderate pain.   albuterol (2.5 MG/3ML) 0.083% nebulizer solution Commonly known as:  PROVENTIL Take 3 mLs (2.5 mg total) by nebulization every 4 (four) hours as needed for wheezing or shortness of breath.   aspirin 81 MG chewable tablet Chew 81 mg by mouth every evening.   CENTRUM SILVER PO Take 1 tablet by mouth daily.   furosemide 40 MG tablet Commonly known as:  LASIX Take 40 mg by mouth daily.   glipiZIDE 5 MG 24 hr tablet Commonly known as:  GLUCOTROL XL Take 5 mg by mouth daily with breakfast.   guaiFENesin-dextromethorphan 100-10 MG/5ML syrup Commonly known as:  ROBITUSSIN DM Take 5 mLs by mouth every 4 (four) hours as needed for cough (chest congestion).   NOVOLOG 100 UNIT/ML injection Generic drug:  insulin aspart Inject 5  Units into the skin 3 (three) times daily before meals. For CBGS > 150   potassium chloride 10 MEQ tablet Commonly known as:  K-DUR Take 1 tablet (10 mEq total) by mouth daily.   senna-docusate 8.6-50 MG tablet Commonly known as:  Senokot-S Take 1 tablet by mouth 2 (two) times daily.   simvastatin 40 MG tablet Commonly known as:  ZOCOR Take 40 mg by mouth daily.   SYSTANE ULTRA 0.4-0.3 % Soln Generic drug:  Polyethyl Glycol-Propyl Glycol Place 2 drops  into both eyes 2 (two) times daily as needed (for dry eyes).   verapamil 180 MG 24 hr capsule Commonly known as:  VERELAN PM Take 180 mg by mouth daily.       Review of Systems  Constitutional: Negative for activity change, appetite change, chills, fatigue and fever.  HENT: Negative for congestion, rhinorrhea, sinus pain, sinus pressure, sneezing and sore throat.   Eyes: Negative.   Respiratory: Negative for cough, chest tightness, shortness of breath and wheezing.   Cardiovascular: Positive for leg swelling. Negative for chest pain and palpitations.  Gastrointestinal: Negative for abdominal distention, abdominal pain, constipation, diarrhea, nausea and vomiting.  Endocrine: Negative.   Genitourinary: Negative for dysuria, flank pain, frequency and urgency.  Musculoskeletal: Positive for gait problem.  Skin: Negative for color change, pallor and rash.  Neurological: Negative for dizziness, seizures, syncope, light-headedness, numbness and headaches.  Hematological: Does not bruise/bleed easily.  Psychiatric/Behavioral: Negative for agitation, confusion, hallucinations and sleep disturbance. The patient is not nervous/anxious.     Immunization History  Administered Date(s) Administered  . Influenza-Unspecified 04/18/2013  . PPD Test 09/09/2016   Pertinent  Health Maintenance Due  Topic Date Due  . FOOT EXAM  12/20/1942  . OPHTHALMOLOGY EXAM  [birth year]20/1942  . URINE MICROALBUMIN  12[birth year]20/1942  . DEXA SCAN  12/21/1999  . PNA vac Low Risk Adult (1 of 2 - PCV13) 07/19/2017 (Originally 12/21/1999)  . INFLUENZA VACCINE  02/16/2017  . HEMOGLOBIN A1C  03/23/2017   Fall Risk  03/15/2016  Falls in the past year? No    Vitals:   10/21/16 1100  BP: 119/70  Pulse: 82  Resp: 19  Weight: 201 lb (91.2 kg)  Height: 5\' 2"  (1.575 m)   Body mass index is 36.76 kg/m. Physical Exam  Constitutional: She is oriented to person, place, and time. She appears well-developed and well-nourished. No  distress.  Pleasant elderly   HENT:  Head: Normocephalic.  Mouth/Throat: Oropharynx is clear and moist. No oropharyngeal exudate.  Eyes: Conjunctivae and EOM are normal. Pupils are equal, round, and reactive to light. Right eye exhibits no discharge. Left eye exhibits no discharge. No scleral icterus.  Neck: Normal range of motion. No JVD present. No thyromegaly present.  Cardiovascular: Normal rate, regular rhythm, normal heart sounds and intact distal pulses.  Exam reveals no gallop and no friction rub.   No murmur heard. Pulmonary/Chest: Effort normal and breath sounds normal. No respiratory distress. She has no wheezes. She has no rales.  Abdominal: Soft. Bowel sounds are normal. She exhibits no distension. There is no tenderness. There is no rebound and no guarding.  Musculoskeletal: She exhibits no tenderness or deformity.  Moves x 4 extremities.Unsteady gait. Bilateral trace edema to lower extremities.Ted hose in place.    Lymphadenopathy:    She has no cervical adenopathy.  Neurological: She is oriented to person, place, and time.  Skin: Skin is warm and dry. No rash noted. No erythema. No pallor.  Psychiatric: She has  a normal mood and affect.    Labs reviewed:  Recent Labs  09/07/16 0439 09/07/16 0743 09/14/16 1907 09/20/16 09/28/16 10/11/16  NA 137 138 137 141 140 141  K 3.7 3.7 4.3 4.0 4.1 4.2  CL 105 102 102  --   --   --   CO2 26 27 30   --   --   --   GLUCOSE 172* 161* 127*  --   --   --   BUN <5* <5* 8 10 10 10   CREATININE 0.40* 0.41* 0.54 0.3* 0.4* 0.4*  CALCIUM 8.3* 8.4* 8.4*  --   --   --     Recent Labs  08/31/16 1648 09/14/16 1907 09/20/16  AST 32 43* 25  ALT 23 19 17   ALKPHOS 168* 104 87  BILITOT 1.6* 1.1  --   PROT 7.5 6.5  --   ALBUMIN 1.8* 2.1*  --     Recent Labs  08/31/16 1648  09/08/16 0508 09/09/16 0516 09/14/16 1907 09/20/16 09/28/16  WBC 12.2*  < > 7.2 6.4 7.0 5.6 5.9  NEUTROABS 11.1*  --   --   --  5.1 33  --   HGB 11.2*  < >  9.0* 9.2* 10.2* 9.6* 10.8*  HCT 36.4  < > 30.4* 30.9* 33.7* 32* 37  MCV 90.5  < > 91.8 91.4 89.6  --   --   PLT 336  < > 270 257 209 151 230  < > = values in this interval not displayed. No results found for: TSH Lab Results  Component Value Date   HGBA1C 7.2 09/20/2016   Assessment/Plan Type 2 DM Lab Results  Component Value Date   HGBA1C 7.2 09/20/2016  CBG's ranging in the 90's-200's. Continue on Glucotrol 5 mg Tablet and Novolog 5 units three times with meals. On ASA and simvastatin. Check Urine microAlbumin ration.   Hyperlipidemia  Continue on simvastatin. Check fasting Lipid panel 10/22/2016.   HTN  B/p stable. Continue on verapamil 180 mg capsule and Furosemide 40 mg tablet daily.    Family/ staff Communication: Reviewed plan of care with patient and facility Nurse supervisor.   Labs/tests ordered: TSH level, fasting Lipid panel and Urine Microalbumin ration

## 2016-10-22 LAB — MICROALBUMIN, URINE

## 2016-10-22 LAB — LIPID PANEL
CHOLESTEROL: 165 mg/dL (ref 0–200)
HDL: 87 mg/dL — AB (ref 35–70)
LDL CALC: 68 mg/dL
Triglycerides: 53 mg/dL (ref 40–160)

## 2016-10-22 LAB — TSH: TSH: 3.59 u[IU]/mL (ref 0.41–5.90)

## 2016-10-28 ENCOUNTER — Non-Acute Institutional Stay (SKILLED_NURSING_FACILITY): Payer: Medicare Other | Admitting: Family

## 2016-10-28 ENCOUNTER — Encounter: Payer: Self-pay | Admitting: Family

## 2016-10-28 DIAGNOSIS — R2681 Unsteadiness on feet: Secondary | ICD-10-CM

## 2016-10-28 DIAGNOSIS — E119 Type 2 diabetes mellitus without complications: Secondary | ICD-10-CM

## 2016-10-28 DIAGNOSIS — I10 Essential (primary) hypertension: Secondary | ICD-10-CM | POA: Diagnosis not present

## 2016-10-28 DIAGNOSIS — R600 Localized edema: Secondary | ICD-10-CM | POA: Diagnosis not present

## 2016-10-28 DIAGNOSIS — K5901 Slow transit constipation: Secondary | ICD-10-CM

## 2016-10-28 DIAGNOSIS — E782 Mixed hyperlipidemia: Secondary | ICD-10-CM

## 2016-11-07 NOTE — Progress Notes (Signed)
Location:  [facility] Room Number: 907 Place of Service:  SNF [phone])  Provider: Marlowe Sax FNP-C   PCP: Elyn Peers, MD Patient Care Team: Lucianne Lei, MD as PCP - General (Family Medicine) Inocencio Homes, DPM as Attending Physician (Podiatry)  Extended Emergency Contact Information Primary Emergency Contact: Noel Christmas Address: [address]          [place], [place] [address] [place] Phone: [phone] Relation: Daughter Secondary Emergency Contact: Karna Christmas [place] Mobile Phone: [phone] Relation: Grandaughter  Code Status: full code  Goals of care:  Advanced Directive information Advanced Directives 10/28/2016  Does Patient Have a Medical Advance Directive? Yes  Type of Advance Directive (No Data)  Does patient want to make changes to medical advance directive? No - Patient declined  Would patient like information on creating a medical advance directive? -     No Known Allergies  Chief Complaint  Patient presents with  . Discharge Note    Discharge from [facility]    HPI:  83 y.o. female seen today at [facility] for discharge home.She was here for short term rehabilitation for post hospital admission from 08/31/2016-09/09/2016 with acute encephalopathy.She was treated for Streptococcus bacteremia, community-acquired pneumonia and Escherichia coli UTI. Her TEE was negative for vegetation.She was started on antibiotic and completed her course on 09/16/2016.She was undergoing rehabilitation at this facility and had an emergency room visit on 09/14/2016 for dyspnea.Her BNP was noted to be elevated. Her Lasix dose was increased and she was sent back to the facility to continue rehabilitation. She is seen in her room today.She denies any acute issues this visit.  She has worked well with PT/OT now stable for discharge home.She will be discharged home with Home health  PT/OT to continue with ROM, Exercise,Gait stability and muscle strengthening.She requires a standard WC with Cushion, anti tippers, extended brake handles, removable elevating leg rests to enable her to maintain current level of independence with ADL's which cannot be achieved with walker or cane.She will also require a 3-1 bedside commode.Patient unable to safely and independently perform toileting transfer in home with unsteady gait.Home health services will be arranged by facility social worker prior to discharge. Prescription medication will be written x 1 month then patient to follow up with PCP in 1-2 weeks.   Facility staff report no new concerns.    Past Medical History:  Diagnosis Date  . Diabetes mellitus without complication ([facility])   . Peripheral vascular disease ([facility])   . Plantar fascial fibromatosis     Past Surgical History:  Procedure Laterality Date  . ABDOMINAL HYSTERECTOMY     partial  . ECTOPIC PREGNANCY SURGERY    . JOINT REPLACEMENT Bilateral    TKR  . TEE WITHOUT CARDIOVERSION N/A 09/07/2016   Procedure: TRANSESOPHAGEAL ECHOCARDIOGRAM (TEE);  Surgeon: Skeet Latch, MD;  Location: [facility] ENDOSCOPY;  Service: Cardiovascular;  Laterality: N/A;      reports that she has never smoked. She has never used smokeless tobacco. She reports that she does not drink alcohol or use drugs. Social History   Social History  . Marital status: Widowed    Spouse name: N/A  . Number of children: N/A  . Years of education: N/A   Occupational History  . Not on file.   Social History Main Topics  . Smoking status: Never Smoker  . Smokeless tobacco: Never Used  . Alcohol use No  . Drug use: No  .  Sexual activity: Not on file   Other Topics Concern  . Not on file   Social History Narrative  . No narrative on file   No Known Allergies  Pertinent  Health Maintenance Due  Topic Date Due  . FOOT EXAM  12/20/1942  . OPHTHALMOLOGY EXAM  12/20/1944  . DEXA SCAN  12/21/1999  .  PNA vac Low Risk Adult (1 of 2 - PCV13) 07/19/2017 (Originally 12/21/1999)  . INFLUENZA VACCINE  02/16/2017  . HEMOGLOBIN A1C  03/23/2017  . URINE MICROALBUMIN  10/22/2017    Medications: Allergies as of 10/28/2016   No Known Allergies     Medication List       Accurate as of 10/28/16 11:59 PM. Always use your most recent med list.          acetaminophen 325 MG tablet Commonly known as:  TYLENOL Take 650 mg by mouth every 6 (six) hours as needed for moderate pain.   albuterol (2.5 MG/3ML) 0.083% nebulizer solution Commonly known as:  PROVENTIL Take 3 mLs (2.5 mg total) by nebulization every 4 (four) hours as needed for wheezing or shortness of breath.   aspirin 81 MG chewable tablet Chew 81 mg by mouth every evening.   CENTRUM SILVER PO Take 1 tablet by mouth daily.   furosemide 40 MG tablet Commonly known as:  LASIX Take 40 mg by mouth daily.   glipiZIDE 5 MG 24 hr tablet Commonly known as:  GLUCOTROL XL Take 5 mg by mouth daily with breakfast.   guaiFENesin-dextromethorphan 100-10 MG/5ML syrup Commonly known as:  ROBITUSSIN DM Take 5 mLs by mouth every 4 (four) hours as needed for cough (chest congestion).   NOVOLOG 100 UNIT/ML injection Generic drug:  insulin aspart Inject 5 Units into the skin 3 (three) times daily before meals. For CBGS > 150   Potassium Chloride ER 20 MEQ Tbcr Take one tablet by mouth once daily for hypokalemia   senna-docusate 8.6-50 MG tablet Commonly known as:  Senokot-S Take 1 tablet by mouth 2 (two) times daily.   simvastatin 40 MG tablet Commonly known as:  ZOCOR Take 40 mg by mouth daily.   SYSTANE ULTRA 0.4-0.3 % Soln Generic drug:  Polyethyl Glycol-Propyl Glycol Place 2 drops into both eyes 2 (two) times daily as needed (for dry eyes).   verapamil 180 MG 24 hr capsule Commonly known as:  VERELAN PM Take 180 mg by mouth daily.       Review of Systems  Constitutional: Negative for activity change, appetite change,  chills, fatigue and fever.  HENT: Negative for congestion, rhinorrhea, sinus pain, sinus pressure, sneezing and sore throat.   Eyes: Negative.   Respiratory: Negative for cough, chest tightness, shortness of breath and wheezing.   Cardiovascular: Positive for leg swelling. Negative for chest pain and palpitations.  Gastrointestinal: Negative for abdominal distention, abdominal pain, constipation, diarrhea, nausea and vomiting.  Endocrine: Negative.   Genitourinary: Negative for dysuria, flank pain, frequency and urgency.  Musculoskeletal: Positive for gait problem.  Skin: Negative for color change, pallor and rash.  Neurological: Negative for dizziness, seizures, syncope, light-headedness, numbness and headaches.  Hematological: Does not bruise/bleed easily.  Psychiatric/Behavioral: Negative for agitation, confusion, hallucinations and sleep disturbance. The patient is not nervous/anxious.     Vitals:   10/28/16 1010  BP: 132/68  Pulse: 88  Resp: 18  Temp: 97.1 F (36.2 C)  TempSrc: Oral  SpO2: 98%  Weight: 201 lb (91.2 kg)  Height: 5\' 2"  (1.575 m)  Body mass index is 36.76 kg/m. Physical Exam  Constitutional: She is oriented to person, place, and time. She appears well-developed and well-nourished. No distress.  HENT:  Head: Normocephalic.  Mouth/Throat: Oropharynx is clear and moist. No oropharyngeal exudate.  Eyes: Conjunctivae and EOM are normal. Pupils are equal, round, and reactive to light. Right eye exhibits no discharge. Left eye exhibits no discharge. No scleral icterus.  Neck: Normal range of motion. No JVD present. No thyromegaly present.  Cardiovascular: Normal rate, regular rhythm, normal heart sounds and intact distal pulses.  Exam reveals no gallop and no friction rub.   No murmur heard. Pulmonary/Chest: Effort normal and breath sounds normal. No respiratory distress. She has no wheezes. She has no rales.  Abdominal: Soft. Bowel sounds are normal. She exhibits  no distension. There is no tenderness. There is no rebound and no guarding.  Musculoskeletal: She exhibits no tenderness or deformity.  Unsteady gait. Moves x 4 extremities. Bilateral trace- 1+ edema to lower extremities.Bilateral Ted hose in place.    Lymphadenopathy:    She has no cervical adenopathy.  Neurological: She is oriented to person, place, and time.  Skin: Skin is warm and dry. No rash noted. No erythema. No pallor.  Psychiatric: She has a normal mood and affect.    Labs reviewed: Basic Metabolic Panel:  Recent Labs  09/07/16 0439 09/07/16 0743 09/14/16 1907 09/20/16 09/28/16 10/11/16  NA 137 138 137 141 140 141  K 3.7 3.7 4.3 4.0 4.1 4.2  CL 105 102 102  --   --   --   CO2 26 27 30   --   --   --   GLUCOSE 172* 161* 127*  --   --   --   BUN <5* <5* 8 10 10 10   CREATININE 0.40* 0.41* 0.54 0.3* 0.4* 0.4*  CALCIUM 8.3* 8.4* 8.4*  --   --   --    Liver Function Tests:  Recent Labs  08/31/16 1648 09/14/16 1907 09/20/16  AST 32 43* 25  ALT 23 19 17   ALKPHOS 168* 104 87  BILITOT 1.6* 1.1  --   PROT 7.5 6.5  --   ALBUMIN 1.8* 2.1*  --     Recent Labs  08/31/16 1648  AMMONIA 76*   CBC:  Recent Labs  08/31/16 1648  09/08/16 0508 09/09/16 0516 09/14/16 1907 09/20/16 09/28/16  WBC 12.2*  < > 7.2 6.4 7.0 5.6 5.9  NEUTROABS 11.1*  --   --   --  5.1 33  --   HGB 11.2*  < > 9.0* 9.2* 10.2* 9.6* 10.8*  HCT 36.4  < > 30.4* 30.9* 33.7* 32* 37  MCV 90.5  < > 91.8 91.4 89.6  --   --   PLT 336  < > 270 257 209 151 230  < > = values in this interval not displayed. Cardiac Enzymes:  Recent Labs  09/14/16 1907 09/14/16 2234  TROPONINI 0.06* 0.06*    Recent Labs  09/09/16 0749 09/09/16 1114 09/09/16 1621  GLUCAP 141* 206* 168*   Assessment/Plan:   1. Unsteady gait  Has worked well with PT/ OT. Will discharge home PT/OT to continue with ROM, Exercise, Gait stability and muscle strengthening. She will require DME standard WC with Cushion, anti tippers,  extended brake handles, removable elevating leg rests to enable her to maintain current level of independence with ADL's which cannot be achieved with walker or cane.She will also require a 3-1 bedside commode.Patient unable to safely  and independently perform toileting transfer in home with unsteady gait. Fall and safety precautions.  2. Essential hypertension B/P stable.Continue on Verapamil and Furosemide.BMP in 1-2 weeks with PCP.    3. Type 2 diabetes mellitus without complication CBG ranging in the 90's-190's with occasional 200's.continue on Novolog 5 units three times before meals and glipizide 5 mg tablet daily. Monitor Hgb A1C.  4. Mixed hyperlipidemia Continue on simvastatin.monitor lipid panel.   5. Localized edema Trace-1+ bilateral lower extremities. Continue on Furosemide 40 mg Tablet daily. Continue on knee high Ted hose on in the morning and off at bedtime.   6. Slow transit constipation Current regimen effective. Continue to monitor.   Patient is being discharged with the following home health services:   -PT/OT for ROM, exercise, gait stability and muscle strengthening  Patient is being discharged with the following durable medical equipment:    - standard WC with Cushion, anti tippers, extended brake handles, removable elevating leg rests  to enable her to maintain current level of independence with ADL's which cannot be achieved with walker or cane.    - A 3-1 bedside commode patient unable to safely and independently perform toileting transfer in home with unsteady gait.  Patient has been advised to f/u with their PCP in 1-2 weeks to for a transitions of care visit.Social services at their facility was responsible for arranging this appointment.Pt was provided with adequate prescriptions of noncontrolled medications to reach the scheduled appointment.For controlled substances, a limited supply was provided as appropriate for the individual patient. If the pt normally  receives these medications from a pain clinic or has a contract with another physician, these medications should be received from that clinic or physician only).     Future labs/tests needed:  CBC, BMP in 1-2 weeks PCP

## 2016-11-08 DIAGNOSIS — M6281 Muscle weakness (generalized): Secondary | ICD-10-CM | POA: Diagnosis not present

## 2016-11-08 DIAGNOSIS — Z7984 Long term (current) use of oral hypoglycemic drugs: Secondary | ICD-10-CM | POA: Diagnosis not present

## 2016-11-08 DIAGNOSIS — Z7982 Long term (current) use of aspirin: Secondary | ICD-10-CM | POA: Diagnosis not present

## 2016-11-08 DIAGNOSIS — E1151 Type 2 diabetes mellitus with diabetic peripheral angiopathy without gangrene: Secondary | ICD-10-CM | POA: Diagnosis not present

## 2016-11-08 DIAGNOSIS — M722 Plantar fascial fibromatosis: Secondary | ICD-10-CM | POA: Diagnosis not present

## 2016-11-08 DIAGNOSIS — I110 Hypertensive heart disease with heart failure: Secondary | ICD-10-CM | POA: Diagnosis not present

## 2016-11-08 DIAGNOSIS — Z96653 Presence of artificial knee joint, bilateral: Secondary | ICD-10-CM | POA: Diagnosis not present

## 2016-11-08 DIAGNOSIS — I5032 Chronic diastolic (congestive) heart failure: Secondary | ICD-10-CM | POA: Diagnosis not present

## 2016-11-11 DIAGNOSIS — Z96653 Presence of artificial knee joint, bilateral: Secondary | ICD-10-CM | POA: Diagnosis not present

## 2016-11-11 DIAGNOSIS — M6281 Muscle weakness (generalized): Secondary | ICD-10-CM | POA: Diagnosis not present

## 2016-11-11 DIAGNOSIS — E1151 Type 2 diabetes mellitus with diabetic peripheral angiopathy without gangrene: Secondary | ICD-10-CM | POA: Diagnosis not present

## 2016-11-11 DIAGNOSIS — I5032 Chronic diastolic (congestive) heart failure: Secondary | ICD-10-CM | POA: Diagnosis not present

## 2016-11-11 DIAGNOSIS — I110 Hypertensive heart disease with heart failure: Secondary | ICD-10-CM | POA: Diagnosis not present

## 2016-11-11 DIAGNOSIS — M722 Plantar fascial fibromatosis: Secondary | ICD-10-CM | POA: Diagnosis not present

## 2016-11-12 DIAGNOSIS — E7889 Other lipoprotein metabolism disorders: Secondary | ICD-10-CM | POA: Diagnosis not present

## 2016-11-12 DIAGNOSIS — E089 Diabetes mellitus due to underlying condition without complications: Secondary | ICD-10-CM | POA: Diagnosis not present

## 2016-11-12 DIAGNOSIS — I10 Essential (primary) hypertension: Secondary | ICD-10-CM | POA: Diagnosis not present

## 2016-11-12 DIAGNOSIS — E785 Hyperlipidemia, unspecified: Secondary | ICD-10-CM | POA: Diagnosis not present

## 2016-11-15 DIAGNOSIS — I110 Hypertensive heart disease with heart failure: Secondary | ICD-10-CM | POA: Diagnosis not present

## 2016-11-15 DIAGNOSIS — I5032 Chronic diastolic (congestive) heart failure: Secondary | ICD-10-CM | POA: Diagnosis not present

## 2016-11-15 DIAGNOSIS — M722 Plantar fascial fibromatosis: Secondary | ICD-10-CM | POA: Diagnosis not present

## 2016-11-15 DIAGNOSIS — M6281 Muscle weakness (generalized): Secondary | ICD-10-CM | POA: Diagnosis not present

## 2016-11-15 DIAGNOSIS — Z96653 Presence of artificial knee joint, bilateral: Secondary | ICD-10-CM | POA: Diagnosis not present

## 2016-11-15 DIAGNOSIS — E1151 Type 2 diabetes mellitus with diabetic peripheral angiopathy without gangrene: Secondary | ICD-10-CM | POA: Diagnosis not present

## 2016-11-16 DIAGNOSIS — Z96653 Presence of artificial knee joint, bilateral: Secondary | ICD-10-CM | POA: Diagnosis not present

## 2016-11-16 DIAGNOSIS — M6281 Muscle weakness (generalized): Secondary | ICD-10-CM | POA: Diagnosis not present

## 2016-11-16 DIAGNOSIS — I739 Peripheral vascular disease, unspecified: Secondary | ICD-10-CM | POA: Diagnosis not present

## 2016-11-16 DIAGNOSIS — L603 Nail dystrophy: Secondary | ICD-10-CM | POA: Diagnosis not present

## 2016-11-16 DIAGNOSIS — I5032 Chronic diastolic (congestive) heart failure: Secondary | ICD-10-CM | POA: Diagnosis not present

## 2016-11-16 DIAGNOSIS — L84 Corns and callosities: Secondary | ICD-10-CM | POA: Diagnosis not present

## 2016-11-16 DIAGNOSIS — I110 Hypertensive heart disease with heart failure: Secondary | ICD-10-CM | POA: Diagnosis not present

## 2016-11-16 DIAGNOSIS — M722 Plantar fascial fibromatosis: Secondary | ICD-10-CM | POA: Diagnosis not present

## 2016-11-16 DIAGNOSIS — E1151 Type 2 diabetes mellitus with diabetic peripheral angiopathy without gangrene: Secondary | ICD-10-CM | POA: Diagnosis not present

## 2016-11-17 DIAGNOSIS — Z96653 Presence of artificial knee joint, bilateral: Secondary | ICD-10-CM | POA: Diagnosis not present

## 2016-11-17 DIAGNOSIS — M722 Plantar fascial fibromatosis: Secondary | ICD-10-CM | POA: Diagnosis not present

## 2016-11-17 DIAGNOSIS — E1151 Type 2 diabetes mellitus with diabetic peripheral angiopathy without gangrene: Secondary | ICD-10-CM | POA: Diagnosis not present

## 2016-11-17 DIAGNOSIS — I110 Hypertensive heart disease with heart failure: Secondary | ICD-10-CM | POA: Diagnosis not present

## 2016-11-17 DIAGNOSIS — I5032 Chronic diastolic (congestive) heart failure: Secondary | ICD-10-CM | POA: Diagnosis not present

## 2016-11-17 DIAGNOSIS — M6281 Muscle weakness (generalized): Secondary | ICD-10-CM | POA: Diagnosis not present

## 2016-11-18 DIAGNOSIS — Z96653 Presence of artificial knee joint, bilateral: Secondary | ICD-10-CM | POA: Diagnosis not present

## 2016-11-18 DIAGNOSIS — I110 Hypertensive heart disease with heart failure: Secondary | ICD-10-CM | POA: Diagnosis not present

## 2016-11-18 DIAGNOSIS — M722 Plantar fascial fibromatosis: Secondary | ICD-10-CM | POA: Diagnosis not present

## 2016-11-18 DIAGNOSIS — M6281 Muscle weakness (generalized): Secondary | ICD-10-CM | POA: Diagnosis not present

## 2016-11-18 DIAGNOSIS — E1151 Type 2 diabetes mellitus with diabetic peripheral angiopathy without gangrene: Secondary | ICD-10-CM | POA: Diagnosis not present

## 2016-11-18 DIAGNOSIS — I5032 Chronic diastolic (congestive) heart failure: Secondary | ICD-10-CM | POA: Diagnosis not present

## 2016-11-19 DIAGNOSIS — Z96653 Presence of artificial knee joint, bilateral: Secondary | ICD-10-CM | POA: Diagnosis not present

## 2016-11-19 DIAGNOSIS — I5032 Chronic diastolic (congestive) heart failure: Secondary | ICD-10-CM | POA: Diagnosis not present

## 2016-11-19 DIAGNOSIS — M722 Plantar fascial fibromatosis: Secondary | ICD-10-CM | POA: Diagnosis not present

## 2016-11-19 DIAGNOSIS — E1151 Type 2 diabetes mellitus with diabetic peripheral angiopathy without gangrene: Secondary | ICD-10-CM | POA: Diagnosis not present

## 2016-11-19 DIAGNOSIS — I110 Hypertensive heart disease with heart failure: Secondary | ICD-10-CM | POA: Diagnosis not present

## 2016-11-19 DIAGNOSIS — M6281 Muscle weakness (generalized): Secondary | ICD-10-CM | POA: Diagnosis not present

## 2016-11-22 DIAGNOSIS — Z96653 Presence of artificial knee joint, bilateral: Secondary | ICD-10-CM | POA: Diagnosis not present

## 2016-11-22 DIAGNOSIS — M6281 Muscle weakness (generalized): Secondary | ICD-10-CM | POA: Diagnosis not present

## 2016-11-22 DIAGNOSIS — M722 Plantar fascial fibromatosis: Secondary | ICD-10-CM | POA: Diagnosis not present

## 2016-11-22 DIAGNOSIS — E1151 Type 2 diabetes mellitus with diabetic peripheral angiopathy without gangrene: Secondary | ICD-10-CM | POA: Diagnosis not present

## 2016-11-22 DIAGNOSIS — I5032 Chronic diastolic (congestive) heart failure: Secondary | ICD-10-CM | POA: Diagnosis not present

## 2016-11-22 DIAGNOSIS — I110 Hypertensive heart disease with heart failure: Secondary | ICD-10-CM | POA: Diagnosis not present

## 2016-11-23 DIAGNOSIS — E1151 Type 2 diabetes mellitus with diabetic peripheral angiopathy without gangrene: Secondary | ICD-10-CM | POA: Diagnosis not present

## 2016-11-23 DIAGNOSIS — M6281 Muscle weakness (generalized): Secondary | ICD-10-CM | POA: Diagnosis not present

## 2016-11-23 DIAGNOSIS — M722 Plantar fascial fibromatosis: Secondary | ICD-10-CM | POA: Diagnosis not present

## 2016-11-23 DIAGNOSIS — I5032 Chronic diastolic (congestive) heart failure: Secondary | ICD-10-CM | POA: Diagnosis not present

## 2016-11-23 DIAGNOSIS — I110 Hypertensive heart disease with heart failure: Secondary | ICD-10-CM | POA: Diagnosis not present

## 2016-11-23 DIAGNOSIS — Z96653 Presence of artificial knee joint, bilateral: Secondary | ICD-10-CM | POA: Diagnosis not present

## 2016-11-24 DIAGNOSIS — E1151 Type 2 diabetes mellitus with diabetic peripheral angiopathy without gangrene: Secondary | ICD-10-CM | POA: Diagnosis not present

## 2016-11-24 DIAGNOSIS — Z96653 Presence of artificial knee joint, bilateral: Secondary | ICD-10-CM | POA: Diagnosis not present

## 2016-11-24 DIAGNOSIS — I110 Hypertensive heart disease with heart failure: Secondary | ICD-10-CM | POA: Diagnosis not present

## 2016-11-24 DIAGNOSIS — M722 Plantar fascial fibromatosis: Secondary | ICD-10-CM | POA: Diagnosis not present

## 2016-11-24 DIAGNOSIS — M6281 Muscle weakness (generalized): Secondary | ICD-10-CM | POA: Diagnosis not present

## 2016-11-24 DIAGNOSIS — I5032 Chronic diastolic (congestive) heart failure: Secondary | ICD-10-CM | POA: Diagnosis not present

## 2016-11-25 DIAGNOSIS — I110 Hypertensive heart disease with heart failure: Secondary | ICD-10-CM | POA: Diagnosis not present

## 2016-11-25 DIAGNOSIS — Z96653 Presence of artificial knee joint, bilateral: Secondary | ICD-10-CM | POA: Diagnosis not present

## 2016-11-25 DIAGNOSIS — I5032 Chronic diastolic (congestive) heart failure: Secondary | ICD-10-CM | POA: Diagnosis not present

## 2016-11-25 DIAGNOSIS — E1151 Type 2 diabetes mellitus with diabetic peripheral angiopathy without gangrene: Secondary | ICD-10-CM | POA: Diagnosis not present

## 2016-11-25 DIAGNOSIS — M722 Plantar fascial fibromatosis: Secondary | ICD-10-CM | POA: Diagnosis not present

## 2016-11-25 DIAGNOSIS — M6281 Muscle weakness (generalized): Secondary | ICD-10-CM | POA: Diagnosis not present

## 2016-11-29 DIAGNOSIS — M6281 Muscle weakness (generalized): Secondary | ICD-10-CM | POA: Diagnosis not present

## 2016-11-29 DIAGNOSIS — Z96653 Presence of artificial knee joint, bilateral: Secondary | ICD-10-CM | POA: Diagnosis not present

## 2016-11-29 DIAGNOSIS — I5032 Chronic diastolic (congestive) heart failure: Secondary | ICD-10-CM | POA: Diagnosis not present

## 2016-11-29 DIAGNOSIS — M722 Plantar fascial fibromatosis: Secondary | ICD-10-CM | POA: Diagnosis not present

## 2016-11-29 DIAGNOSIS — E1151 Type 2 diabetes mellitus with diabetic peripheral angiopathy without gangrene: Secondary | ICD-10-CM | POA: Diagnosis not present

## 2016-11-29 DIAGNOSIS — I110 Hypertensive heart disease with heart failure: Secondary | ICD-10-CM | POA: Diagnosis not present

## 2016-11-30 DIAGNOSIS — M6281 Muscle weakness (generalized): Secondary | ICD-10-CM | POA: Diagnosis not present

## 2016-11-30 DIAGNOSIS — Z96653 Presence of artificial knee joint, bilateral: Secondary | ICD-10-CM | POA: Diagnosis not present

## 2016-11-30 DIAGNOSIS — E1151 Type 2 diabetes mellitus with diabetic peripheral angiopathy without gangrene: Secondary | ICD-10-CM | POA: Diagnosis not present

## 2016-11-30 DIAGNOSIS — I5032 Chronic diastolic (congestive) heart failure: Secondary | ICD-10-CM | POA: Diagnosis not present

## 2016-11-30 DIAGNOSIS — I110 Hypertensive heart disease with heart failure: Secondary | ICD-10-CM | POA: Diagnosis not present

## 2016-11-30 DIAGNOSIS — M722 Plantar fascial fibromatosis: Secondary | ICD-10-CM | POA: Diagnosis not present

## 2016-12-01 DIAGNOSIS — I5032 Chronic diastolic (congestive) heart failure: Secondary | ICD-10-CM | POA: Diagnosis not present

## 2016-12-01 DIAGNOSIS — I110 Hypertensive heart disease with heart failure: Secondary | ICD-10-CM | POA: Diagnosis not present

## 2016-12-01 DIAGNOSIS — M722 Plantar fascial fibromatosis: Secondary | ICD-10-CM | POA: Diagnosis not present

## 2016-12-01 DIAGNOSIS — Z96653 Presence of artificial knee joint, bilateral: Secondary | ICD-10-CM | POA: Diagnosis not present

## 2016-12-01 DIAGNOSIS — M6281 Muscle weakness (generalized): Secondary | ICD-10-CM | POA: Diagnosis not present

## 2016-12-01 DIAGNOSIS — E1151 Type 2 diabetes mellitus with diabetic peripheral angiopathy without gangrene: Secondary | ICD-10-CM | POA: Diagnosis not present

## 2016-12-02 DIAGNOSIS — M722 Plantar fascial fibromatosis: Secondary | ICD-10-CM | POA: Diagnosis not present

## 2016-12-02 DIAGNOSIS — M130 Polyarthritis, unspecified: Secondary | ICD-10-CM | POA: Diagnosis not present

## 2016-12-02 DIAGNOSIS — Z96653 Presence of artificial knee joint, bilateral: Secondary | ICD-10-CM | POA: Diagnosis not present

## 2016-12-02 DIAGNOSIS — E1151 Type 2 diabetes mellitus with diabetic peripheral angiopathy without gangrene: Secondary | ICD-10-CM | POA: Diagnosis not present

## 2016-12-02 DIAGNOSIS — I5032 Chronic diastolic (congestive) heart failure: Secondary | ICD-10-CM | POA: Diagnosis not present

## 2016-12-02 DIAGNOSIS — I110 Hypertensive heart disease with heart failure: Secondary | ICD-10-CM | POA: Diagnosis not present

## 2016-12-02 DIAGNOSIS — M6281 Muscle weakness (generalized): Secondary | ICD-10-CM | POA: Diagnosis not present

## 2016-12-03 DIAGNOSIS — I5032 Chronic diastolic (congestive) heart failure: Secondary | ICD-10-CM | POA: Diagnosis not present

## 2016-12-03 DIAGNOSIS — E1151 Type 2 diabetes mellitus with diabetic peripheral angiopathy without gangrene: Secondary | ICD-10-CM | POA: Diagnosis not present

## 2016-12-03 DIAGNOSIS — M6281 Muscle weakness (generalized): Secondary | ICD-10-CM | POA: Diagnosis not present

## 2016-12-03 DIAGNOSIS — M722 Plantar fascial fibromatosis: Secondary | ICD-10-CM | POA: Diagnosis not present

## 2016-12-03 DIAGNOSIS — I110 Hypertensive heart disease with heart failure: Secondary | ICD-10-CM | POA: Diagnosis not present

## 2016-12-03 DIAGNOSIS — Z96653 Presence of artificial knee joint, bilateral: Secondary | ICD-10-CM | POA: Diagnosis not present

## 2017-01-05 DIAGNOSIS — M545 Low back pain: Secondary | ICD-10-CM | POA: Diagnosis not present

## 2017-01-05 DIAGNOSIS — M170 Bilateral primary osteoarthritis of knee: Secondary | ICD-10-CM | POA: Diagnosis not present

## 2017-01-18 DIAGNOSIS — E782 Mixed hyperlipidemia: Secondary | ICD-10-CM | POA: Diagnosis not present

## 2017-01-18 DIAGNOSIS — M1250 Traumatic arthropathy, unspecified site: Secondary | ICD-10-CM | POA: Diagnosis not present

## 2017-01-18 DIAGNOSIS — E0800 Diabetes mellitus due to underlying condition with hyperosmolarity without nonketotic hyperglycemic-hyperosmolar coma (NKHHC): Secondary | ICD-10-CM | POA: Diagnosis not present

## 2017-01-18 DIAGNOSIS — I10 Essential (primary) hypertension: Secondary | ICD-10-CM | POA: Diagnosis not present

## 2017-01-25 DIAGNOSIS — E1151 Type 2 diabetes mellitus with diabetic peripheral angiopathy without gangrene: Secondary | ICD-10-CM | POA: Diagnosis not present

## 2017-01-25 DIAGNOSIS — I739 Peripheral vascular disease, unspecified: Secondary | ICD-10-CM | POA: Diagnosis not present

## 2017-01-25 DIAGNOSIS — L84 Corns and callosities: Secondary | ICD-10-CM | POA: Diagnosis not present

## 2017-01-25 DIAGNOSIS — L603 Nail dystrophy: Secondary | ICD-10-CM | POA: Diagnosis not present

## 2017-10-10 DIAGNOSIS — E039 Hypothyroidism, unspecified: Secondary | ICD-10-CM | POA: Diagnosis not present

## 2017-10-10 DIAGNOSIS — E118 Type 2 diabetes mellitus with unspecified complications: Secondary | ICD-10-CM | POA: Diagnosis not present

## 2017-10-10 DIAGNOSIS — E782 Mixed hyperlipidemia: Secondary | ICD-10-CM | POA: Diagnosis not present

## 2017-10-10 DIAGNOSIS — I10 Essential (primary) hypertension: Secondary | ICD-10-CM | POA: Diagnosis not present

## 2017-10-10 DIAGNOSIS — E785 Hyperlipidemia, unspecified: Secondary | ICD-10-CM | POA: Diagnosis not present

## 2017-11-15 DIAGNOSIS — E1151 Type 2 diabetes mellitus with diabetic peripheral angiopathy without gangrene: Secondary | ICD-10-CM | POA: Diagnosis not present

## 2017-11-15 DIAGNOSIS — L84 Corns and callosities: Secondary | ICD-10-CM | POA: Diagnosis not present

## 2017-11-15 DIAGNOSIS — I739 Peripheral vascular disease, unspecified: Secondary | ICD-10-CM | POA: Diagnosis not present

## 2017-11-15 DIAGNOSIS — L603 Nail dystrophy: Secondary | ICD-10-CM | POA: Diagnosis not present

## 2018-02-08 DIAGNOSIS — E1100 Type 2 diabetes mellitus with hyperosmolarity without nonketotic hyperglycemic-hyperosmolar coma (NKHHC): Secondary | ICD-10-CM | POA: Diagnosis not present

## 2018-02-08 DIAGNOSIS — I10 Essential (primary) hypertension: Secondary | ICD-10-CM | POA: Diagnosis not present

## 2018-02-08 DIAGNOSIS — E039 Hypothyroidism, unspecified: Secondary | ICD-10-CM | POA: Diagnosis not present

## 2018-02-08 DIAGNOSIS — Z6838 Body mass index (BMI) 38.0-38.9, adult: Secondary | ICD-10-CM | POA: Diagnosis not present

## 2018-02-08 DIAGNOSIS — E785 Hyperlipidemia, unspecified: Secondary | ICD-10-CM | POA: Diagnosis not present

## 2018-05-31 DIAGNOSIS — Z6838 Body mass index (BMI) 38.0-38.9, adult: Secondary | ICD-10-CM | POA: Diagnosis not present

## 2018-05-31 DIAGNOSIS — E782 Mixed hyperlipidemia: Secondary | ICD-10-CM | POA: Diagnosis not present

## 2018-05-31 DIAGNOSIS — E039 Hypothyroidism, unspecified: Secondary | ICD-10-CM | POA: Diagnosis not present

## 2018-05-31 DIAGNOSIS — E1100 Type 2 diabetes mellitus with hyperosmolarity without nonketotic hyperglycemic-hyperosmolar coma (NKHHC): Secondary | ICD-10-CM | POA: Diagnosis not present

## 2018-06-13 DIAGNOSIS — Z1231 Encounter for screening mammogram for malignant neoplasm of breast: Secondary | ICD-10-CM | POA: Diagnosis not present

## 2018-06-13 DIAGNOSIS — Z803 Family history of malignant neoplasm of breast: Secondary | ICD-10-CM | POA: Diagnosis not present

## 2018-06-20 DIAGNOSIS — N6002 Solitary cyst of left breast: Secondary | ICD-10-CM | POA: Diagnosis not present

## 2018-06-20 DIAGNOSIS — N6322 Unspecified lump in the left breast, upper inner quadrant: Secondary | ICD-10-CM | POA: Diagnosis not present

## 2018-12-18 IMAGING — CR DG CHEST 1V PORT
1 series · 1 of 1 positions shown · non-contrast
Comparison: Prior radiograph from 08/31/2016.

CLINICAL DATA: Initial evaluation for acute shortness of breath,
history of pneumonia.

EXAM:
PORTABLE CHEST 1 VIEW

[AP]
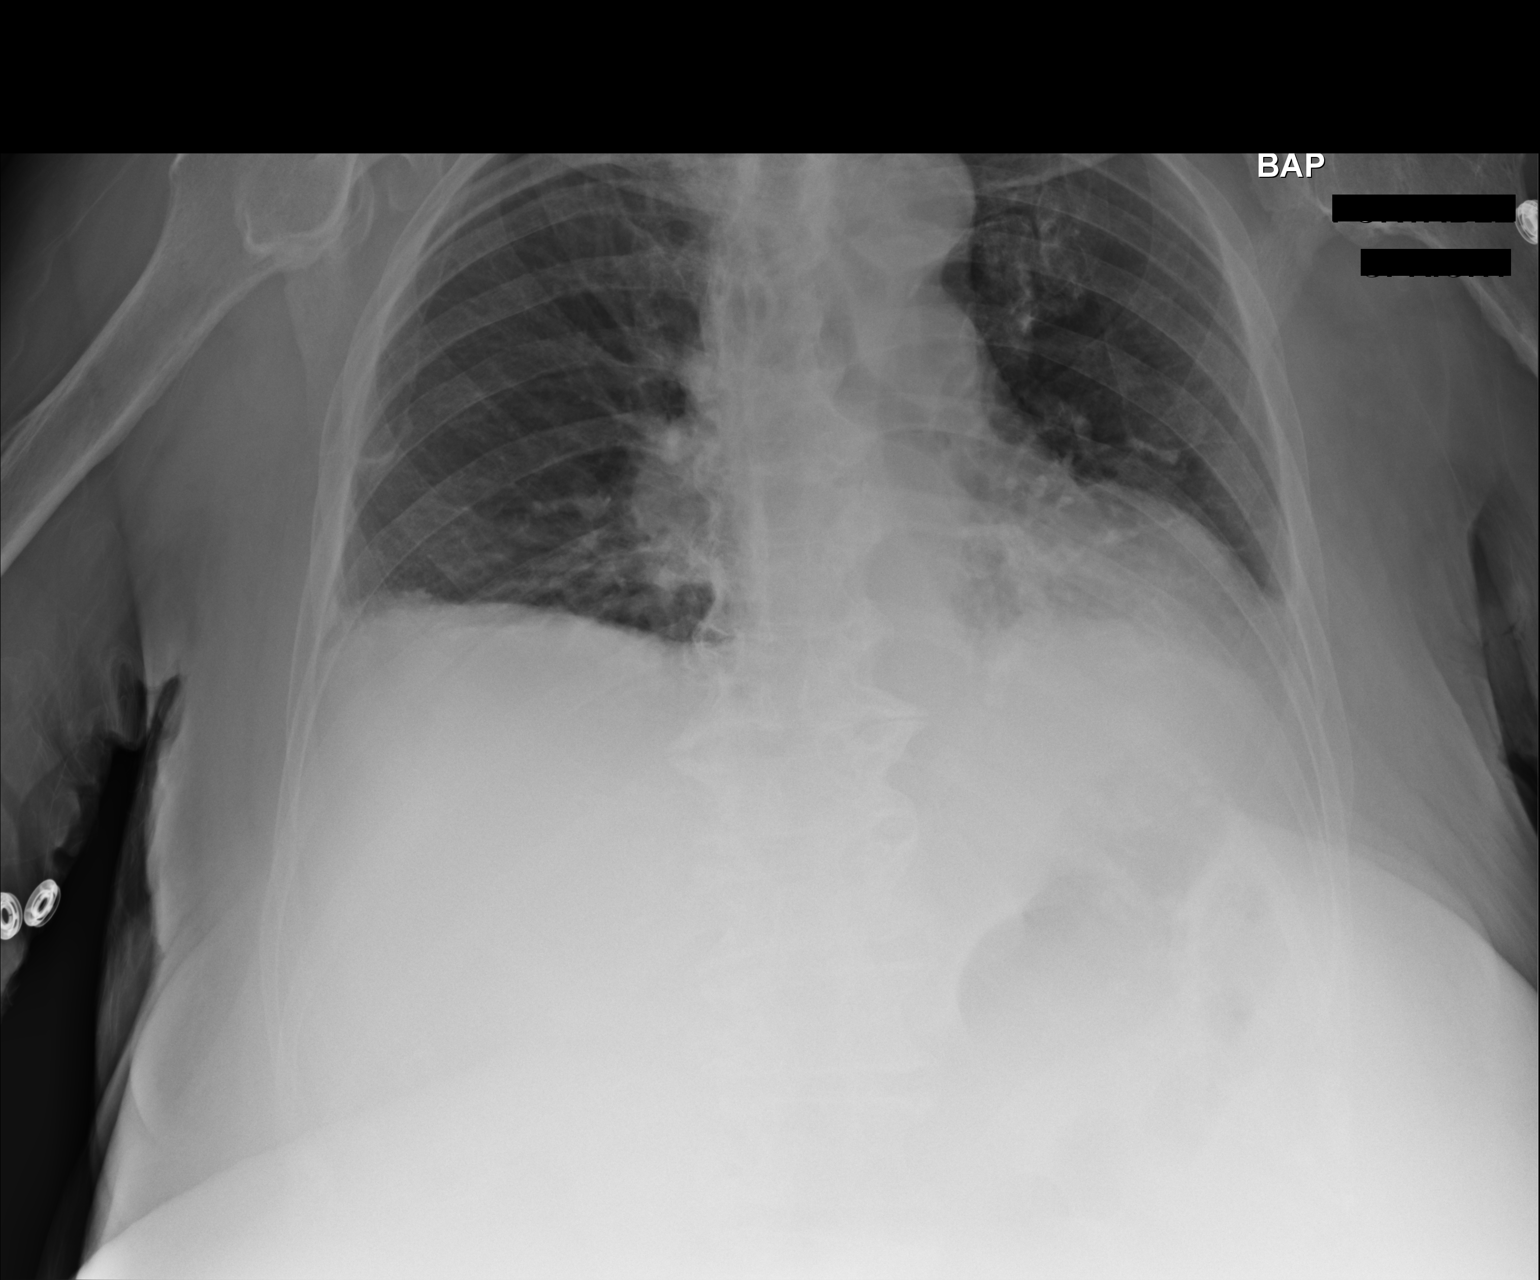

[1 of 1 positions shown; findings below may reference images not displayed]

FINDINGS: Stable cardiomegaly. Mediastinal silhouette within normal limits.
Aortic atherosclerosis noted.

Lungs hypoinflated. Patchy consolidation at the left lung base,
concerning for possible infiltrate. Bronchovascular crowding/
atelectasis present the right lung base as well. No pulmonary edema.
Probable small left pleural effusion. No pneumothorax.

No acute osseus abnormality.
IMPRESSION: 1. Left basilar consolidation, concerning for possible pneumonia.
2. Probable small left pleural effusion.
3. Stable cardiomegaly without pulmonary edema.
4. Aortic atherosclerosis.

## 2019-02-16 ENCOUNTER — Other Ambulatory Visit: Payer: Self-pay

## 2019-05-29 DIAGNOSIS — E1169 Type 2 diabetes mellitus with other specified complication: Secondary | ICD-10-CM | POA: Diagnosis not present

## 2019-05-29 DIAGNOSIS — E6609 Other obesity due to excess calories: Secondary | ICD-10-CM | POA: Diagnosis not present

## 2019-05-29 DIAGNOSIS — Z23 Encounter for immunization: Secondary | ICD-10-CM | POA: Diagnosis not present

## 2019-05-29 DIAGNOSIS — I10 Essential (primary) hypertension: Secondary | ICD-10-CM | POA: Diagnosis not present

## 2019-08-30 DIAGNOSIS — L03031 Cellulitis of right toe: Secondary | ICD-10-CM | POA: Diagnosis not present

## 2019-08-30 DIAGNOSIS — M2041 Other hammer toe(s) (acquired), right foot: Secondary | ICD-10-CM | POA: Diagnosis not present

## 2019-09-13 DIAGNOSIS — L600 Ingrowing nail: Secondary | ICD-10-CM | POA: Diagnosis not present

## 2019-09-13 DIAGNOSIS — M71571 Other bursitis, not elsewhere classified, right ankle and foot: Secondary | ICD-10-CM | POA: Diagnosis not present

## 2019-09-14 ENCOUNTER — Ambulatory Visit: Payer: Medicare Other

## 2019-09-17 ENCOUNTER — Ambulatory Visit: Payer: Medicare Other | Attending: Internal Medicine

## 2019-09-17 DIAGNOSIS — Z23 Encounter for immunization: Secondary | ICD-10-CM

## 2019-09-17 NOTE — Progress Notes (Signed)
   Covid-19 Vaccination Clinic  Name:  Toni [name]    MRN: [id number] DOB: [birth year]  09/17/2019  [name] was observed post Covid-19 immunization for 15 minutes without incidence. She was provided with Vaccine Information Sheet and instruction to access the V-Safe system.   [name] was instructed to call 911 with any severe reactions post vaccine: Marland Kitchen Difficulty breathing  . Swelling of your face and throat  . A fast heartbeat  . A bad rash all over your body  . Dizziness and weakness    Immunizations Administered    Name Date Dose VIS Date Route   Pfizer COVID-19 Vaccine 09/17/2019  1:02 PM 0.3 mL 06/29/2019 Intramuscular   Manufacturer: [place]   Lot: [id number]   [place]: [id number]

## 2019-10-10 ENCOUNTER — Ambulatory Visit: Payer: Medicare Other | Attending: Internal Medicine

## 2019-10-10 DIAGNOSIS — Z23 Encounter for immunization: Secondary | ICD-10-CM

## 2019-10-10 NOTE — Progress Notes (Signed)
   Covid-19 Vaccination Clinic  Name:  Toni [name]    MRN: [id number] DOB: [birth year]  10/10/2019  [name] was observed post Covid-19 immunization for 15 minutes without incident. She was provided with Vaccine Information Sheet and instruction to access the V-Safe system.   [name] was instructed to call 911 with any severe reactions post vaccine: Marland Kitchen Difficulty breathing  . Swelling of face and throat  . A fast heartbeat  . A bad rash all over body  . Dizziness and weakness   Immunizations Administered    Name Date Dose VIS Date Route   Pfizer COVID-19 Vaccine 10/10/2019  2:40 PM 0.3 mL 06/29/2019 Intramuscular   Manufacturer: [place]   Lot: [id number]   [place]: [facility]  Name:  Toni [name]    MRN: [id number] DOB: Mar 14, 1933  10/10/2019  [name] was observed post Covid-19 immunization for 15 minutes without incident. She was provided with Vaccine Information Sheet and instruction to access the V-Safe system.   [name] was instructed to call 911 with any severe reactions post vaccine: Marland Kitchen Difficulty breathing  . Swelling of face and throat  . A fast heartbeat  . A bad rash all over body  . Dizziness and weakness   Immunizations Administered    Name Date Dose VIS Date Route   Pfizer COVID-19 Vaccine 10/10/2019  2:40 PM 0.3 mL 06/29/2019 Intramuscular   Manufacturer: [place]   Lot: [id number]   [place]: [facility]  Name:  Toni [name]    MRN: [id number] DOB: 1933/03/10  10/10/2019  [name] was observed post Covid-19 immunization for 15 minutes without incident. She was provided with Vaccine Information Sheet and instruction to access the V-Safe system.   [name] was instructed to call 911 with any severe reactions post vaccine: Marland Kitchen Difficulty breathing  . Swelling of face and throat  . A fast heartbeat  . A bad rash all over body  . Dizziness and weakness   Immunizations  Administered    Name Date Dose VIS Date Route   Pfizer COVID-19 Vaccine 10/10/2019  2:40 PM 0.3 mL 06/29/2019 Intramuscular   Manufacturer: [place]   Lot: [id number]   [place]: [id number]

## 2020-07-15 ENCOUNTER — Ambulatory Visit: Payer: Medicare Other

## 2020-07-26 ENCOUNTER — Ambulatory Visit: Payer: Medicare Other | Attending: Internal Medicine

## 2020-07-26 DIAGNOSIS — Z23 Encounter for immunization: Secondary | ICD-10-CM

## 2020-07-26 NOTE — Progress Notes (Signed)
   Covid-19 Vaccination Clinic  Name:  Toni [name]    MRN: [id number] DOB: [birth year]  07/26/2020  [name] was observed post Covid-19 immunization for 15 minutes without incident. She was provided with Vaccine Information Sheet and instruction to access the V-Safe system.   [name] was instructed to call 911 with any severe reactions post vaccine: Marland Kitchen Difficulty breathing  . Swelling of face and throat  . A fast heartbeat  . A bad rash all over body  . Dizziness and weakness   Immunizations Administered    Name Date Dose VIS Date Route   Pfizer COVID-19 Vaccine 07/26/2020 12:25 PM 0.3 mL 05/07/2020 Intramuscular   Manufacturer: [place]   Lot: [id number]   [place]: [id number]

## 2020-11-11 DIAGNOSIS — Z0000 Encounter for general adult medical examination without abnormal findings: Secondary | ICD-10-CM | POA: Diagnosis not present

## 2020-11-27 DIAGNOSIS — L603 Nail dystrophy: Secondary | ICD-10-CM | POA: Diagnosis not present

## 2020-11-27 DIAGNOSIS — I739 Peripheral vascular disease, unspecified: Secondary | ICD-10-CM | POA: Diagnosis not present

## 2020-11-27 DIAGNOSIS — L84 Corns and callosities: Secondary | ICD-10-CM | POA: Diagnosis not present

## 2021-02-02 DIAGNOSIS — R600 Localized edema: Secondary | ICD-10-CM | POA: Diagnosis not present

## 2021-02-02 DIAGNOSIS — E1169 Type 2 diabetes mellitus with other specified complication: Secondary | ICD-10-CM | POA: Diagnosis not present

## 2021-02-02 DIAGNOSIS — E785 Hyperlipidemia, unspecified: Secondary | ICD-10-CM | POA: Diagnosis not present

## 2021-02-02 DIAGNOSIS — I10 Essential (primary) hypertension: Secondary | ICD-10-CM | POA: Diagnosis not present

## 2021-02-15 DIAGNOSIS — E782 Mixed hyperlipidemia: Secondary | ICD-10-CM | POA: Diagnosis not present

## 2021-02-15 DIAGNOSIS — E1100 Type 2 diabetes mellitus with hyperosmolarity without nonketotic hyperglycemic-hyperosmolar coma (NKHHC): Secondary | ICD-10-CM | POA: Diagnosis not present

## 2021-02-15 DIAGNOSIS — I10 Essential (primary) hypertension: Secondary | ICD-10-CM | POA: Diagnosis not present

## 2021-02-15 DIAGNOSIS — E0800 Diabetes mellitus due to underlying condition with hyperosmolarity without nonketotic hyperglycemic-hyperosmolar coma (NKHHC): Secondary | ICD-10-CM | POA: Diagnosis not present

## 2021-03-18 DIAGNOSIS — E1100 Type 2 diabetes mellitus with hyperosmolarity without nonketotic hyperglycemic-hyperosmolar coma (NKHHC): Secondary | ICD-10-CM | POA: Diagnosis not present

## 2021-03-18 DIAGNOSIS — E782 Mixed hyperlipidemia: Secondary | ICD-10-CM | POA: Diagnosis not present

## 2021-03-18 DIAGNOSIS — I10 Essential (primary) hypertension: Secondary | ICD-10-CM | POA: Diagnosis not present

## 2021-12-16 DIAGNOSIS — E782 Mixed hyperlipidemia: Secondary | ICD-10-CM | POA: Diagnosis not present

## 2021-12-16 DIAGNOSIS — I10 Essential (primary) hypertension: Secondary | ICD-10-CM | POA: Diagnosis not present

## 2022-03-24 ENCOUNTER — Inpatient Hospital Stay (HOSPITAL_COMMUNITY)
Admission: EM | Admit: 2022-03-24 | Discharge: 2022-03-29 | DRG: 480 | Disposition: A | Discharge status: Skilled Nursing Facility | Payer: Medicare Other | Attending: Internal Medicine | Admitting: Internal Medicine

## 2022-03-24 ENCOUNTER — Other Ambulatory Visit: Payer: Self-pay

## 2022-03-24 ENCOUNTER — Encounter (HOSPITAL_COMMUNITY): Payer: Self-pay | Admitting: Anesthesiology

## 2022-03-24 ENCOUNTER — Emergency Department (HOSPITAL_COMMUNITY): Payer: Medicare Other

## 2022-03-24 DIAGNOSIS — E785 Hyperlipidemia, unspecified: Secondary | ICD-10-CM | POA: Diagnosis present

## 2022-03-24 DIAGNOSIS — R8281 Pyuria: Secondary | ICD-10-CM | POA: Diagnosis present

## 2022-03-24 DIAGNOSIS — I110 Hypertensive heart disease with heart failure: Secondary | ICD-10-CM | POA: Diagnosis present

## 2022-03-24 DIAGNOSIS — I2510 Atherosclerotic heart disease of native coronary artery without angina pectoris: Secondary | ICD-10-CM | POA: Diagnosis present

## 2022-03-24 DIAGNOSIS — E1165 Type 2 diabetes mellitus with hyperglycemia: Secondary | ICD-10-CM | POA: Diagnosis present

## 2022-03-24 DIAGNOSIS — E8809 Other disorders of plasma-protein metabolism, not elsewhere classified: Secondary | ICD-10-CM | POA: Diagnosis present

## 2022-03-24 DIAGNOSIS — Z79899 Other long term (current) drug therapy: Secondary | ICD-10-CM

## 2022-03-24 DIAGNOSIS — R8271 Bacteriuria: Secondary | ICD-10-CM | POA: Diagnosis not present

## 2022-03-24 DIAGNOSIS — R278 Other lack of coordination: Secondary | ICD-10-CM | POA: Diagnosis not present

## 2022-03-24 DIAGNOSIS — R0602 Shortness of breath: Secondary | ICD-10-CM | POA: Diagnosis not present

## 2022-03-24 DIAGNOSIS — Z23 Encounter for immunization: Secondary | ICD-10-CM | POA: Diagnosis not present

## 2022-03-24 DIAGNOSIS — Z7401 Bed confinement status: Secondary | ICD-10-CM | POA: Diagnosis not present

## 2022-03-24 DIAGNOSIS — Z833 Family history of diabetes mellitus: Secondary | ICD-10-CM

## 2022-03-24 DIAGNOSIS — R600 Localized edema: Secondary | ICD-10-CM | POA: Diagnosis not present

## 2022-03-24 DIAGNOSIS — R102 Pelvic and perineal pain: Secondary | ICD-10-CM | POA: Diagnosis not present

## 2022-03-24 DIAGNOSIS — S7290XA Unspecified fracture of unspecified femur, initial encounter for closed fracture: Secondary | ICD-10-CM | POA: Diagnosis present

## 2022-03-24 DIAGNOSIS — R2689 Other abnormalities of gait and mobility: Secondary | ICD-10-CM | POA: Diagnosis not present

## 2022-03-24 DIAGNOSIS — M7989 Other specified soft tissue disorders: Secondary | ICD-10-CM | POA: Diagnosis present

## 2022-03-24 DIAGNOSIS — Z01810 Encounter for preprocedural cardiovascular examination: Secondary | ICD-10-CM

## 2022-03-24 DIAGNOSIS — D62 Acute posthemorrhagic anemia: Secondary | ICD-10-CM | POA: Diagnosis not present

## 2022-03-24 DIAGNOSIS — R339 Retention of urine, unspecified: Secondary | ICD-10-CM | POA: Diagnosis present

## 2022-03-24 DIAGNOSIS — I5033 Acute on chronic diastolic (congestive) heart failure: Secondary | ICD-10-CM | POA: Diagnosis not present

## 2022-03-24 DIAGNOSIS — Z794 Long term (current) use of insulin: Secondary | ICD-10-CM

## 2022-03-24 DIAGNOSIS — W010XXA Fall on same level from slipping, tripping and stumbling without subsequent striking against object, initial encounter: Secondary | ICD-10-CM | POA: Diagnosis present

## 2022-03-24 DIAGNOSIS — E559 Vitamin D deficiency, unspecified: Secondary | ICD-10-CM | POA: Diagnosis not present

## 2022-03-24 DIAGNOSIS — Z6841 Body Mass Index (BMI) 40.0 and over, adult: Secondary | ICD-10-CM

## 2022-03-24 DIAGNOSIS — M898X9 Other specified disorders of bone, unspecified site: Secondary | ICD-10-CM | POA: Diagnosis not present

## 2022-03-24 DIAGNOSIS — R739 Hyperglycemia, unspecified: Secondary | ICD-10-CM | POA: Diagnosis not present

## 2022-03-24 DIAGNOSIS — W19XXXA Unspecified fall, initial encounter: Principal | ICD-10-CM

## 2022-03-24 DIAGNOSIS — R9431 Abnormal electrocardiogram [ECG] [EKG]: Secondary | ICD-10-CM

## 2022-03-24 DIAGNOSIS — Z01818 Encounter for other preprocedural examination: Secondary | ICD-10-CM | POA: Diagnosis not present

## 2022-03-24 DIAGNOSIS — I272 Pulmonary hypertension, unspecified: Secondary | ICD-10-CM | POA: Diagnosis present

## 2022-03-24 DIAGNOSIS — D649 Anemia, unspecified: Secondary | ICD-10-CM

## 2022-03-24 DIAGNOSIS — I2721 Secondary pulmonary arterial hypertension: Secondary | ICD-10-CM | POA: Diagnosis not present

## 2022-03-24 DIAGNOSIS — M9702XA Periprosthetic fracture around internal prosthetic left hip joint, initial encounter: Secondary | ICD-10-CM | POA: Diagnosis not present

## 2022-03-24 DIAGNOSIS — R011 Cardiac murmur, unspecified: Secondary | ICD-10-CM | POA: Diagnosis present

## 2022-03-24 DIAGNOSIS — E1151 Type 2 diabetes mellitus with diabetic peripheral angiopathy without gangrene: Secondary | ICD-10-CM | POA: Diagnosis not present

## 2022-03-24 DIAGNOSIS — M84352A Stress fracture, left femur, initial encounter for fracture: Secondary | ICD-10-CM | POA: Diagnosis present

## 2022-03-24 DIAGNOSIS — I509 Heart failure, unspecified: Secondary | ICD-10-CM | POA: Diagnosis not present

## 2022-03-24 DIAGNOSIS — Z83438 Family history of other disorder of lipoprotein metabolism and other lipidemia: Secondary | ICD-10-CM

## 2022-03-24 DIAGNOSIS — M6281 Muscle weakness (generalized): Secondary | ICD-10-CM | POA: Diagnosis not present

## 2022-03-24 DIAGNOSIS — Y92003 Bedroom of unspecified non-institutional (private) residence as the place of occurrence of the external cause: Secondary | ICD-10-CM | POA: Diagnosis not present

## 2022-03-24 DIAGNOSIS — G8929 Other chronic pain: Secondary | ICD-10-CM | POA: Diagnosis present

## 2022-03-24 DIAGNOSIS — I739 Peripheral vascular disease, unspecified: Secondary | ICD-10-CM

## 2022-03-24 DIAGNOSIS — I10 Essential (primary) hypertension: Secondary | ICD-10-CM | POA: Diagnosis not present

## 2022-03-24 DIAGNOSIS — E871 Hypo-osmolality and hyponatremia: Secondary | ICD-10-CM | POA: Diagnosis present

## 2022-03-24 DIAGNOSIS — R41841 Cognitive communication deficit: Secondary | ICD-10-CM | POA: Diagnosis not present

## 2022-03-24 DIAGNOSIS — S79929A Unspecified injury of unspecified thigh, initial encounter: Secondary | ICD-10-CM | POA: Diagnosis not present

## 2022-03-24 DIAGNOSIS — I080 Rheumatic disorders of both mitral and aortic valves: Secondary | ICD-10-CM | POA: Diagnosis present

## 2022-03-24 DIAGNOSIS — M9712XA Periprosthetic fracture around internal prosthetic left knee joint, initial encounter: Principal | ICD-10-CM | POA: Diagnosis present

## 2022-03-24 DIAGNOSIS — S72302A Unspecified fracture of shaft of left femur, initial encounter for closed fracture: Secondary | ICD-10-CM

## 2022-03-24 DIAGNOSIS — S80919A Unspecified superficial injury of unspecified knee, initial encounter: Secondary | ICD-10-CM | POA: Diagnosis not present

## 2022-03-24 DIAGNOSIS — M25562 Pain in left knee: Secondary | ICD-10-CM | POA: Diagnosis not present

## 2022-03-24 DIAGNOSIS — Z8249 Family history of ischemic heart disease and other diseases of the circulatory system: Secondary | ICD-10-CM

## 2022-03-24 DIAGNOSIS — E119 Type 2 diabetes mellitus without complications: Secondary | ICD-10-CM | POA: Diagnosis not present

## 2022-03-24 DIAGNOSIS — Z9889 Other specified postprocedural states: Secondary | ICD-10-CM | POA: Diagnosis not present

## 2022-03-24 DIAGNOSIS — S72402A Unspecified fracture of lower end of left femur, initial encounter for closed fracture: Secondary | ICD-10-CM | POA: Diagnosis not present

## 2022-03-24 DIAGNOSIS — Z96642 Presence of left artificial hip joint: Secondary | ICD-10-CM | POA: Diagnosis not present

## 2022-03-24 DIAGNOSIS — S7292XA Unspecified fracture of left femur, initial encounter for closed fracture: Secondary | ICD-10-CM

## 2022-03-24 DIAGNOSIS — Z7982 Long term (current) use of aspirin: Secondary | ICD-10-CM

## 2022-03-24 DIAGNOSIS — S7292XD Unspecified fracture of left femur, subsequent encounter for closed fracture with routine healing: Secondary | ICD-10-CM | POA: Diagnosis not present

## 2022-03-24 DIAGNOSIS — M79605 Pain in left leg: Secondary | ICD-10-CM | POA: Diagnosis not present

## 2022-03-24 DIAGNOSIS — G8918 Other acute postprocedural pain: Secondary | ICD-10-CM | POA: Diagnosis not present

## 2022-03-24 HISTORY — DX: Essential (primary) hypertension: I10

## 2022-03-24 LAB — CBC WITH DIFFERENTIAL/PLATELET
Abs Immature Granulocytes: 0.02 10*3/uL (ref 0.00–0.07)
Basophils Absolute: 0 10*3/uL (ref 0.0–0.1)
Basophils Relative: 1 %
Eosinophils Absolute: 0 10*3/uL (ref 0.0–0.5)
Eosinophils Relative: 1 %
HCT: 38.2 % (ref 36.0–46.0)
Hemoglobin: 12 g/dL (ref 12.0–15.0)
Immature Granulocytes: 0 %
Lymphocytes Relative: 23 %
Lymphs Abs: 1.4 10*3/uL (ref 0.7–4.0)
MCH: 29 pg (ref 26.0–34.0)
MCHC: 31.4 g/dL (ref 30.0–36.0)
MCV: 92.3 fL (ref 80.0–100.0)
Monocytes Absolute: 0.6 10*3/uL (ref 0.1–1.0)
Monocytes Relative: 10 %
Neutro Abs: 3.9 10*3/uL (ref 1.7–7.7)
Neutrophils Relative %: 65 %
Platelets: 165 10*3/uL (ref 150–400)
RBC: 4.14 MIL/uL (ref 3.87–5.11)
RDW: 12.1 % (ref 11.5–15.5)
WBC: 6 10*3/uL (ref 4.0–10.5)
nRBC: 0 % (ref 0.0–0.2)

## 2022-03-24 LAB — TYPE AND SCREEN
ABO/RH(D): B NEG
Antibody Screen: NEGATIVE

## 2022-03-24 LAB — BASIC METABOLIC PANEL
Anion gap: 10 (ref 5–15)
BUN: 13 mg/dL (ref 8–23)
CO2: 28 mmol/L (ref 22–32)
Calcium: 9 mg/dL (ref 8.9–10.3)
Chloride: 102 mmol/L (ref 98–111)
Creatinine, Ser: 0.64 mg/dL (ref 0.44–1.00)
GFR, Estimated: >60 mL/min (ref >60)
Glucose, Bld: 205 mg/dL — ABNORMAL HIGH (ref 70–99)
Potassium: 4.4 mmol/L (ref 3.5–5.1)
Sodium: 140 mmol/L (ref 135–145)

## 2022-03-24 LAB — HEMOGLOBIN A1C
Hgb A1c MFr Bld: 7.8 % — ABNORMAL HIGH (ref 4.8–5.6)
Mean Plasma Glucose: 177.16 mg/dL

## 2022-03-24 LAB — PROTIME-INR
INR: 1.1 (ref 0.8–1.2)
Prothrombin Time: 14.1 seconds (ref 11.4–15.2)

## 2022-03-24 LAB — CBG MONITORING, ED: Glucose-Capillary: 166 mg/dL — ABNORMAL HIGH (ref 70–99)

## 2022-03-24 LAB — BRAIN NATRIURETIC PEPTIDE: B Natriuretic Peptide: 306.4 pg/mL — ABNORMAL HIGH (ref 0.0–100.0)

## 2022-03-24 LAB — ABO/RH: ABO/RH(D): B NEG

## 2022-03-24 MED ORDER — SENNOSIDES-DOCUSATE SODIUM 8.6-50 MG PO TABS
1.0000 | ORAL_TABLET | Freq: Every evening | ORAL | Status: DC | PRN
  Administered 2022-03-27: 1 via ORAL

## 2022-03-24 MED ORDER — SENNOSIDES-DOCUSATE SODIUM 8.6-50 MG PO TABS
1.0000 | ORAL_TABLET | Freq: Two times a day (BID) | ORAL | Status: DC
  Administered 2022-03-25 – 2022-03-29 (×7): 1 via ORAL
  Filled 2022-03-24 (×9): qty 1

## 2022-03-24 MED ORDER — CARVEDILOL 3.125 MG PO TABS
3.1250 mg | ORAL_TABLET | Freq: Two times a day (BID) | ORAL | Status: DC
  Administered 2022-03-24 – 2022-03-29 (×9): 3.125 mg via ORAL
  Filled 2022-03-24 (×9): qty 1

## 2022-03-24 MED ORDER — MORPHINE SULFATE (PF) 4 MG/ML IV SOLN
4.0000 mg | INTRAVENOUS | Status: DC | PRN
  Administered 2022-03-24: 4 mg via INTRAVENOUS
  Filled 2022-03-24: qty 1

## 2022-03-24 MED ORDER — SODIUM CHLORIDE 0.9 % IV SOLN: 1.0000 g | Freq: Once | INTRAVENOUS | Status: DC

## 2022-03-24 MED ORDER — HYDRALAZINE HCL 25 MG PO TABS: 25.0000 mg | ORAL_TABLET | Freq: Four times a day (QID) | ORAL | Status: DC | PRN

## 2022-03-24 MED ORDER — INSULIN ASPART 100 UNIT/ML IJ SOLN
0.0000 [IU] | Freq: Three times a day (TID) | INTRAMUSCULAR | Status: DC
  Administered 2022-03-24: 4 [IU] via SUBCUTANEOUS
  Administered 2022-03-25: 3 [IU] via SUBCUTANEOUS
  Administered 2022-03-25: 4 [IU] via SUBCUTANEOUS
  Administered 2022-03-26 (×2): 7 [IU] via SUBCUTANEOUS

## 2022-03-24 MED ORDER — HYDROMORPHONE HCL 1 MG/ML IJ SOLN: 0.5000 mg | INTRAMUSCULAR | Status: DC | PRN

## 2022-03-24 MED ORDER — ALBUTEROL SULFATE (2.5 MG/3ML) 0.083% IN NEBU: 2.5000 mg | INHALATION_SOLUTION | RESPIRATORY_TRACT | Status: DC | PRN

## 2022-03-24 MED ORDER — ASPIRIN 81 MG PO CHEW
81.0000 mg | CHEWABLE_TABLET | Freq: Every evening | ORAL | Status: DC
  Administered 2022-03-24 – 2022-03-28 (×5): 81 mg via ORAL
  Filled 2022-03-24 (×5): qty 1

## 2022-03-24 MED ORDER — ONDANSETRON HCL 4 MG/2ML IJ SOLN
4.0000 mg | Freq: Four times a day (QID) | INTRAMUSCULAR | Status: DC | PRN
  Administered 2022-03-24: 4 mg via INTRAVENOUS
  Filled 2022-03-24: qty 2

## 2022-03-24 MED ORDER — SIMVASTATIN 20 MG PO TABS
40.0000 mg | ORAL_TABLET | Freq: Every day | ORAL | Status: DC
  Administered 2022-03-24 – 2022-03-29 (×5): 40 mg via ORAL
  Filled 2022-03-24 (×5): qty 2

## 2022-03-24 MED ORDER — ALBUTEROL SULFATE (2.5 MG/3ML) 0.083% IN NEBU
2.5000 mg | INHALATION_SOLUTION | RESPIRATORY_TRACT | Status: DC | PRN
Start: 2022-03-24 — End: 2022-03-24

## 2022-03-24 MED ORDER — ENOXAPARIN SODIUM 40 MG/0.4ML IJ SOSY
40.0000 mg | PREFILLED_SYRINGE | INTRAMUSCULAR | Status: DC
  Administered 2022-03-24: 40 mg via SUBCUTANEOUS
  Filled 2022-03-24: qty 0.4

## 2022-03-24 MED ORDER — HYDROCODONE-ACETAMINOPHEN 5-325 MG PO TABS
1.0000 | ORAL_TABLET | Freq: Four times a day (QID) | ORAL | Status: DC | PRN
  Administered 2022-03-25: 2 via ORAL
  Administered 2022-03-27 – 2022-03-28 (×3): 1 via ORAL
  Filled 2022-03-24: qty 2
  Filled 2022-03-24: qty 1
  Filled 2022-03-24: qty 2
  Filled 2022-03-24 (×2): qty 1

## 2022-03-24 MED ORDER — BISACODYL 5 MG PO TBEC: 5.0000 mg | DELAYED_RELEASE_TABLET | Freq: Every day | ORAL | Status: DC | PRN

## 2022-03-24 MED ORDER — SODIUM CHLORIDE 0.9 % IV SOLN: 500.0000 mg | Freq: Once | INTRAVENOUS | Status: DC

## 2022-03-24 MED ORDER — FUROSEMIDE 10 MG/ML IJ SOLN
40.0000 mg | Freq: Two times a day (BID) | INTRAMUSCULAR | Status: DC
Start: 2022-03-24 — End: 2022-03-27
  Administered 2022-03-24 – 2022-03-27 (×7): 40 mg via INTRAVENOUS
  Filled 2022-03-24 (×7): qty 4

## 2022-03-24 NOTE — ED Provider Notes (Signed)
[facility] EMERGENCY DEPARTMENT Provider Note   CSN: [id number] Arrival date & time: 03/24/22  1145     History  Chief Complaint  Patient presents with   Knee Pain    Toni [name] is a 88 y.o. female.  88 year old female with past medical history of peripheral vascular disease, diabetes, complex history of bilateral knee replacement, reports a rod in her left leg from her ankle to her mid thigh.  Patient is brought in by EMS today for pain in her left knee area after a fall which occurred last night.  Patient reports she is bedbound secondary to her left knee problems but is generally able to transfer from bed to bedside commode.  Patient was trying to slide off her bed to her commode last night, states that the strap on her shoes was not in the correct position causing her to slip out of the bed and landed on the floor.  Did not hit head, no loss of consciousness, not anticoagulated.  States that her left leg was "bent in the wrong direction."  Patient was able to call to family in the home who called paramedics who assisted patient back into bed.  Patient feels like she should have come to the hospital last night but declined.  Came today due to ongoing pain in her left knee which is worse than usual.  She denies any other new injuries, complaints, concerns.       Home Medications Prior to Admission medications   Medication Sig Start Date End Date Taking? Authorizing Provider  acetaminophen (TYLENOL) 325 MG tablet Take 650 mg by mouth every 6 (six) hours as needed for moderate pain.     [provider]  albuterol (PROVENTIL) (2.5 MG/3ML) 0.083% nebulizer solution Take 3 mLs (2.5 mg total) by nebulization every 4 (four) hours as needed for wheezing or shortness of breath. 09/09/16   Robbie Lis, MD  aspirin 81 MG chewable tablet Chew 81 mg by mouth every evening.    [provider]  furosemide (LASIX) 40 MG tablet Take 40 mg by mouth daily.     [provider]  glipiZIDE (GLUCOTROL XL) 5 MG 24 hr tablet Take 5 mg by mouth daily with breakfast.    [provider]  guaiFENesin-dextromethorphan (ROBITUSSIN DM) 100-10 MG/5ML syrup Take 5 mLs by mouth every 4 (four) hours as needed for cough (chest congestion). 09/09/16   Robbie Lis, MD  insulin aspart (NOVOLOG) 100 UNIT/ML injection Inject 5 Units into the skin 3 (three) times daily before meals. For CBGS > 150    [provider]  Multiple Vitamins-Minerals (CENTRUM SILVER PO) Take 1 tablet by mouth daily.     [provider]  Polyethyl Glycol-Propyl Glycol (SYSTANE ULTRA) 0.4-0.3 % SOLN Place 2 drops into both eyes 2 (two) times daily as needed (for dry eyes).    [provider]  Potassium Chloride ER 20 MEQ TBCR Take one tablet by mouth once daily for hypokalemia    [provider]  senna-docusate (SENOKOT-S) 8.6-50 MG tablet Take 1 tablet by mouth 2 (two) times daily.     [provider]  simvastatin (ZOCOR) 40 MG tablet Take 40 mg by mouth daily.     [provider]  verapamil (VERELAN PM) 180 MG 24 hr capsule Take 180 mg by mouth daily.    [provider]      Allergies    Patient has no known allergies.  Review of Systems   Review of Systems Negative except as per HPI Physical Exam Updated Vital Signs BP 130/69   Pulse 96   Temp 98.4 F (36.9 C) (Oral)   Resp 19   Ht '5\' 2"'$  (1.575 m)   Wt 95.3 kg   SpO2 98%   BMI 38.41 kg/m  Physical Exam Vitals and nursing note reviewed.  Constitutional:      General: She is not in acute distress.    Appearance: She is well-developed. She is not diaphoretic.  HENT:     Head: Normocephalic and atraumatic.  Pulmonary:     Effort: Pulmonary effort is normal.  Musculoskeletal:        General: Tenderness present.     Comments: Upper extremities and right lower extremity without significant pain or deformity.  Does have pain in the left thigh, unable  to logroll leg secondary to pain.  Will not flex knee but states this is baseline for her.  Sensation intact.  Skin:    General: Skin is warm and dry.     Findings: No erythema.  Neurological:     Mental Status: She is alert and oriented to person, place, and time.     Sensory: No sensory deficit.  Psychiatric:        Behavior: Behavior normal.     ED Results / Procedures / Treatments   Labs (all labs ordered are listed, but only abnormal results are displayed) Labs Reviewed  BASIC METABOLIC PANEL - Abnormal; Notable for the following components:      Result Value   Glucose, Bld 205 (*)    All other components within normal limits  CBC WITH DIFFERENTIAL/PLATELET  PROTIME-INR  TYPE AND SCREEN    EKG None  Radiology DG Pelvis 1-2 Views  Result Date: 03/24/2022 CLINICAL DATA:  [place] out of bed yesterday. Hip pain. Left knee pain. EXAM: PELVIS - 1-2 VIEW COMPARISON:  None Available. FINDINGS: Advanced arthropathic changes of the left hip, with marked concentric joint space narrowing, subchondral sclerosis and prominent osteophytes. Femoral neck appears aligned with the more superior aspect of the femoral head, but there is no defined fracture line. There is surrounding heterotopic bone formation. All these findings are likely chronic. Mild superolateral right hip joint space narrowing. SI joints and pubic symphysis are normally spaced and aligned. No bone lesion. No definite acute fracture. IMPRESSION: 1. No convincing acute fracture, although a subtle fracture of the proximal left femur is difficult to exclude due to the extensive arthropathic changes and heterotopic bone formation. If there is significant clinical suspicion for a proximal left femur fracture, recommend follow-up left hip MRI versus high-resolution unenhanced CT. Electronically Signed   By: Lajean Manes M.D.   On: 03/24/2022 12:55    Procedures Procedures    Medications Ordered in ED Medications  ondansetron  (ZOFRAN) injection 4 mg (4 mg Intravenous Given 03/24/22 1402)  morphine (PF) 4 MG/ML injection 4 mg (4 mg Intravenous Given 03/24/22 1405)    ED Course/ Medical Decision Making/ A&P Clinical Course as of 03/24/22 1505  Wed Mar 24, 2022  1403 EXAM: LEFT FEMUR 2 VIEWS  COMPARISON: None Available.  FINDINGS: Oblique fracture of the distal femur, extending from the midshaft to the distal femur. Proximal component of the fracture is displaced medially by 4.4 cm. Fracture is mildly angulated posteriorly.  Femoral component of the knee prosthesis is well aligned, stem lying within the fractured distal component.  There is ossification along the quadriceps tendon.  Advanced arthropathic changes of the hip joint as detailed under the pelvis radiographs. Knee joint prosthetic components are normally aligned.  There is soft tissue edema the mid to distal thigh.  IMPRESSION: 1. Displaced fracture of the left femur extending from the midshaft to the distal femur.   Electronically Signed By: Lajean Manes M.D. On: 03/24/2022 13:40   [LM]    Clinical Course User Index [LM] Tacy Learn, PA-C                           Medical Decision Making Amount and/or Complexity of Data Reviewed Labs: ordered. Radiology: ordered.  Risk Prescription drug management.   This patient presents to the ED for concern of left leg pain after fall, this involves an extensive number of treatment options, and is a complaint that carries with it a high risk of complications and morbidity.  The differential diagnosis includes but not limited to fracture, dislocation, strain   Co morbidities that complicate the patient evaluation  Diabetes, peripheral vascular disease, bilateral knee replacement, complications with left knee replacement requiring additional surgeries.   Additional history obtained:  Additional history obtained from family at bedside who assist with history as above External records  from outside source obtained and reviewed including no recent relevant labs, medical notes or imaging available for comparison   Lab Tests:  I Ordered, and personally interpreted labs.  The pertinent results include: CBC unremarkable.  BMP with elevated glucose at 205, nonfasting.  INR normal.   Imaging Studies ordered:  I ordered imaging studies including x-ray pelvis, left femur, left tib-fib I independently visualized and interpreted imaging which showed chronic changes to the left hip, left midshaft femur fracture I agree with the radiologist interpretation   Consultations Obtained:  I requested consultation with the orthopedic team, Hilbert Odor, PA-C,  and discussed lab and imaging findings as well as pertinent plan - they recommend: Plan for surgery tomorrow, n.p.o. after midnight.  Will consult hospitalist service for admission. Case discussed with Dr. Roosevelt Locks with [facility] hospitalist service who will consult for admission.   Problem List / ED Course / Critical interventions / Medication management  88 year old female presents with left leg pain after a fall last night where she slipped out of bed resulting in injury to the left leg.  She is found to have a left femur fracture.  Patient is not anticoagulated, reports a few chips earlier this morning otherwise NPO.  Patient was seen by Ortho, plan for surgery tomorrow.  Will consult hospitalist for admission as soon as labs result. I ordered medication including Morphine, Zofran for pain if needed Reevaluation of the patient after these medicines showed that the patient  did not request pain medicaitons I have reviewed the patients home medicines and have made adjustments as needed   Social Determinants of Health:  Lives with family, reportedly bedbound, transfers to bedside commode   Test / Admission - Considered:  Admit for surgery         Final Clinical Impression(s) / ED Diagnoses Final diagnoses:  Fall,  initial encounter  Closed fracture of shaft of left femur, unspecified fracture morphology, initial encounter [facility])    Rx / DC Orders ED Discharge Orders     None         Tacy Learn, PA-C 03/24/22 1505    Pattricia Boss, MD 03/25/22 0945

## 2022-03-24 NOTE — ED Notes (Signed)
PACU RN called, no longer plan to administer nerve block at this time due to fracture location

## 2022-03-24 NOTE — Progress Notes (Signed)
Orthopedic Tech Progress Note Patient Details:  Toni [name] 1932-08-27 [id number]  Musculoskeletal Traction Type of Traction: Bucks Skin Traction Traction Location: LLE Traction Weight: 5 lbs   Post Interventions Patient Tolerated: Well Instructions Provided: Adjustment of device  Arville Go 03/24/2022, 3:06 PM

## 2022-03-24 NOTE — ED Triage Notes (Signed)
Pt BIB EMS from home for bilateral knee pain. Pt had a fall off the bed yesterday and hit her knees. Pt is not on blood thinner and denies a hitting her head in the fall.  EMS Vitals 150/86 HR 98 98% RA CBG 251

## 2022-03-24 NOTE — H&P (Signed)
History and Physical    Toni [name] PIR:[id number] DOB: 1933/07/14 DOA: 03/24/2022  PCP: Criss Rosales, Clinic (Confirm with patient/family/NH records and if not entered, this has to be entered at [facility] point of entry) Patient coming from: Home  I have personally briefly reviewed patient's old medical records in [place]  Chief Complaint: I fell and broke my leg  HPI: Toni [name] is a 88 y.o. female with medical history significant of HTN, HLD, IDDM on diet control, morbid obesity, chronic HFpEF, severe pulmonary hypertension, knee OA status post left TKA in 2003, PVD, chronic ambulation dysfunction, presented with mechanical fall and left leg fracture.  Patient has chronic ambulation dysfunction, uses a walker to ambulate, and nighttime use of bedside commode.  Last night, patient woke up to use the commode but she rolled and lost balance and fell on the floor on left leg.  Denies any chest pain lightheadedness of prodromes.  At baseline, patient uses roller walker to ambulate because of chronic knee problems for more than 8 years.  She reported frequent shortness of breath and chronic productive cough " a lot of phlegms" as well as chronic bilateral lower extremity swelling.  She used to be on Lasix but stopped taking "for sometime", she used to be on insulin for her diabetes but at least this year, she has been only on diet control for unclear reasons.  ED Course: X-ray showed angulated spiral left mid femur fracture.  Glucose 205.  Review of Systems: As per HPI otherwise 14 point review of systems negative.    Past Medical History:  Diagnosis Date   Diabetes mellitus without complication ([place])    Peripheral vascular disease ([place])    Plantar fascial fibromatosis     Past Surgical History:  Procedure Laterality Date   ABDOMINAL HYSTERECTOMY     partial   ECTOPIC PREGNANCY SURGERY     JOINT REPLACEMENT Bilateral    TKR   TEE WITHOUT CARDIOVERSION N/A 09/07/2016   Procedure:  TRANSESOPHAGEAL ECHOCARDIOGRAM (TEE);  Surgeon: Skeet Latch, MD;  Location: [facility] ENDOSCOPY;  Service: Cardiovascular;  Laterality: N/A;     reports that she has never smoked. She has never used smokeless tobacco. She reports that she does not drink alcohol and does not use drugs.  No Known Allergies  Family History  Problem Relation Age of Onset   Hypertension Mother    Hyperlipidemia Mother        before age 86   Heart disease Father    Hypertension Father    Hyperlipidemia Father        before age 46   Diabetes Sister    Other Sister        amputation     Prior to Admission medications   Medication Sig Start Date End Date Taking? Authorizing Provider  aspirin 81 MG chewable tablet Chew 81 mg by mouth every evening.   Yes [provider]  furosemide (LASIX) 40 MG tablet Take 40 mg by mouth daily.   Yes [provider]  Multiple Vitamins-Minerals (CENTRUM SILVER PO) Take 1 tablet by mouth daily.    Yes [provider]  Polyethyl Glycol-Propyl Glycol (SYSTANE ULTRA) 0.4-0.3 % SOLN Place 2 drops into both eyes 2 (two) times daily as needed (for dry eyes).   Yes [provider]  Potassium Chloride ER 20 MEQ TBCR Take one tablet by mouth once daily for hypokalemia   Yes [provider]  simvastatin (ZOCOR) 40 MG tablet Take 40 mg  by mouth daily.    Yes [provider]  verapamil (VERELAN PM) 180 MG 24 hr capsule Take 180 mg by mouth daily.   Yes [provider]  albuterol (PROVENTIL) (2.5 MG/3ML) 0.083% nebulizer solution Take 3 mLs (2.5 mg total) by nebulization every 4 (four) hours as needed for wheezing or shortness of breath. Patient not taking: Reported on 03/24/2022 09/09/16   Robbie Lis, MD    Physical Exam: Vitals:   03/24/22 1149 03/24/22 1415 03/24/22 1430 03/24/22 1445  BP:  120/62 126/62 130/69  Pulse:  100 96 96  Resp:  19  19  Temp:      TempSrc:      SpO2:  94% 93% 98%  Weight: 95.3 kg      Height: '5\' 2"'$  (1.575 m)       Constitutional: NAD, calm, comfortable Vitals:   03/24/22 1149 03/24/22 1415 03/24/22 1430 03/24/22 1445  BP:  120/62 126/62 130/69  Pulse:  100 96 96  Resp:  19  19  Temp:      TempSrc:      SpO2:  94% 93% 98%  Weight: 95.3 kg     Height: '5\' 2"'$  (1.575 m)      Eyes: PERRL, lids and conjunctivae normal ENMT: Mucous membranes are moist. Posterior pharynx clear of any exudate or lesions.Normal dentition.  Neck: normal, supple, no masses, no thyromegaly Respiratory: clear to auscultation bilaterally, no wheezing, no crackles. Normal respiratory effort. No accessory muscle use.  Cardiovascular: Regular rate and rhythm, loud systolic murmur on heart base, loud S3. 2+ extremity edema. 2+ pedal pulses. No carotid bruits.  Abdomen: no tenderness, no masses palpated. No hepatosplenomegaly. Bowel sounds positive.  Musculoskeletal: no clubbing / cyanosis. No joint deformity upper and lower extremities. Good ROM, no contractures. Normal muscle tone.  Skin: no rashes, lesions, ulcers. No induration Neurologic: CN 2-12 grossly intact. Sensation intact, DTR normal. Strength 5/5 in all 4.  Psychiatric: Normal judgment and insight. Alert and oriented x 3. Normal mood.     Labs on Admission: I have personally reviewed following labs and imaging studies  CBC: Recent Labs  Lab 03/24/22 1400  WBC 6.0  NEUTROABS 3.9  HGB 12.0  HCT 38.2  MCV 92.3  PLT 222   Basic Metabolic Panel: Recent Labs  Lab 03/24/22 1400  NA 140  K 4.4  CL 102  CO2 28  GLUCOSE 205*  BUN 13  CREATININE 0.64  CALCIUM 9.0   GFR: Estimated Creatinine Clearance: 53.3 mL/min (by C-G formula based on SCr of 0.64 mg/dL). Liver Function Tests: No results for input(s): "AST", "ALT", "ALKPHOS", "BILITOT", "PROT", "ALBUMIN" in the last 168 hours. No results for input(s): "LIPASE", "AMYLASE" in the last 168 hours. No results for input(s): "AMMONIA" in the last 168 hours. Coagulation  Profile: Recent Labs  Lab 03/24/22 1400  INR 1.1   Cardiac Enzymes: No results for input(s): "CKTOTAL", "CKMB", "CKMBINDEX", "TROPONINI" in the last 168 hours. BNP (last 3 results) No results for input(s): "PROBNP" in the last 8760 hours. HbA1C: No results for input(s): "HGBA1C" in the last 72 hours. CBG: No results for input(s): "GLUCAP" in the last 168 hours. Lipid Profile: No results for input(s): "CHOL", "HDL", "LDLCALC", "TRIG", "CHOLHDL", "LDLDIRECT" in the last 72 hours. Thyroid Function Tests: No results for input(s): "TSH", "T4TOTAL", "FREET4", "T3FREE", "THYROIDAB" in the last 72 hours. Anemia Panel: No results for input(s): "VITAMINB12", "FOLATE", "FERRITIN", "TIBC", "IRON", "RETICCTPCT" in the last 72 hours. Urine analysis:  Component Value Date/Time   COLORURINE YELLOW 09/14/2016 2148   APPEARANCEUR CLEAR 09/14/2016 2148   LABSPEC 1.006 09/14/2016 2148   PHURINE 7.0 09/14/2016 2148   GLUCOSEU NEGATIVE 09/14/2016 2148   HGBUR NEGATIVE 09/14/2016 2148   [place] NEGATIVE 09/14/2016 2148   [place] NEGATIVE 09/14/2016 2148   PROTEINUR NEGATIVE 09/14/2016 2148   NITRITE NEGATIVE 09/14/2016 2148   LEUKOCYTESUR NEGATIVE 09/14/2016 2148    Radiological Exams on Admission: DG Pelvis 1-2 Views  Result Date: 03/24/2022 CLINICAL DATA:  [place] out of bed yesterday. Hip pain. Left knee pain. EXAM: PELVIS - 1-2 VIEW COMPARISON:  None Available. FINDINGS: Advanced arthropathic changes of the left hip, with marked concentric joint space narrowing, subchondral sclerosis and prominent osteophytes. Femoral neck appears aligned with the more superior aspect of the femoral head, but there is no defined fracture line. There is surrounding heterotopic bone formation. All these findings are likely chronic. Mild superolateral right hip joint space narrowing. SI joints and pubic symphysis are normally spaced and aligned. No bone lesion. No definite acute fracture. IMPRESSION: 1. No  convincing acute fracture, although a subtle fracture of the proximal left femur is difficult to exclude due to the extensive arthropathic changes and heterotopic bone formation. If there is significant clinical suspicion for a proximal left femur fracture, recommend follow-up left hip MRI versus high-resolution unenhanced CT. Electronically Signed   By: Lajean Manes M.D.   On: 03/24/2022 12:55    EKG: Independently reviewed.  Sinus rhythm, no acute ST changes, prolonged QTc.  Assessment/Plan Principal Problem:   Femur fracture (HCC) Active Problems:   Stress fracture, left femur, initial encounter for fracture  (please populate well all problems here in Problem List. (For example, if patient is on BP meds at home and you resume or decide to hold them, it is a problem that needs to be her. Same for CAD, COPD, HLD and so on)  Acute left femur fracture -Scheduled OR tomorrow, discussed with orthopedic surgery PA, likely will be postponed due to recent block.  Acute on chronic HFpEF decompensation -Significant fluid overload, peripheral edema and bilateral crackles, start IV Lasix 40 mg IV twice daily.  Echocardiogram -Echocardiogram 2019 showed significant abnormalities with significant severe pulmonary hypertension, and mild to moderate mitral regurgitation.  On physical exam she does have multiple murmurs and S3.  She has poor activity tolerance but significant exertional dyspnea with minimal activity at home.  I tried to reach patient's daughter to find out more information but nobody picked up the phone.  Did not consult cardiology for preop cardiac work-up.  Orthopedic surgery updated. -Start beta-blocker -DVT prophylaxis  IDDM with hyperglycemia -Not coherent with insulin regimen, reason no known -Start sliding scale  HTN -On Lasix hold off verapamil, start Coreg  Pulmonary HTN -In decompensation with significant peripheral edema, IV Lasix, echo pending.  Prolonged Qtc -Appears to  be a chronic issue, no significant QTc changes compared to EKG in 2018.  PVD -CAD equivalent -Considered to be mild on the right side of the leg with the ABI study in 2018 and normal ABI on left side in 2018. -Continue aspirin, outpatient follow-up with PCP.  Morbid obesity -BMI >38, calorie control recommended  DVT prophylaxis: Lovenox Code Status: Full code Family Communication: Left daughter message Disposition Plan: Patient sick with CHF consultation requiring IV diuresis, and left femoral fracture requiring ORIF, expect more than 2 midnight hospital stay. Consults called: Cardiology, orthopedic surgery Admission status: Telemetry admission   Lequita Halt MD [facility] Pager [phone]  03/24/2022, 3:23 PM

## 2022-03-24 NOTE — ED Notes (Signed)
Pt placed on hospital bed and placed in Buck's traction by ortho tech.

## 2022-03-24 NOTE — Consult Note (Addendum)
Cardiology Consultation   Patient ID: Toni [name] MRN: [id number]; DOB: 1932-08-11  Admit date: 03/24/2022 Date of Consult: 03/24/2022  PCP:  [facility] Cardiologist:  None        Patient Profile:   Toni [name] is a 88 y.o. female with a hx of hypertension, hyperlipidemia, historically insulin-dependent diabetes mellitus (now reportedly diet controlled), morbid obesity, chronic heart failure with preserved ejection fraction, severe pulmonary hypertension, knee osteoarthritis status post left total knee arthroplasty in 2003, peripheral vascular disease, chronic ambulation dysfunction who is being seen 03/24/2022 for cardiac surgical evaluation at the request of Dr. Roosevelt Locks.  History of Present Illness:   Toni [name] is an 88 year old female with above medical history who presented to the emergency department status post mechanical fall.  Patient has chronic ambulatory dysfunction secondary to chronic knee pain and uses a walker to get around.  Toni [name] uses a bedside toilet at night.  Reportedly last night patient awoke to use the toilet but lost her balance and fell onto the floor on her left leg.  X-ray of patient's left leg shows an angulated spiral fracture of the mid femur.  Patient lives in a house with her granddaughter who helps to care for her. Regarding infrequent ambulation, patient says "I got used to staying in bed with COVID and just don't get out of bed much."   In addition to symptoms related to fall prompting admission, patient reports that Toni [name] experiences shortness of breath with a chronic, productive cough.  Toni [name] also endorses chronic bilateral lower extremity swelling.  Review of patient's chart shows that Toni [name] has been on Lasix for several years although Toni [name] has not been taking this for at least 6 months. Patient reports that Toni [name] sees Dr. Berdine Addison with [facility] for primary care but has not followed up and could not get additional  prescriptions for Lasix. Today patient says that Toni [name] has not noticed an increase in leg swelling or shortness of breath without lasix but Toni [name] does sleep in a slightly elevated position at night to reduce shortness of breath. Toni [name] does however, deny significant orthopnea.  Toni [name] is also been insulin-dependent for diabetes management but reportedly has been diet controlled for at least this calendar year. Today Toni [name] tells me "I've never really needed to take insulin, I just manage my sugar with diet."  Previous cardiac evaluation includes a complete TTE in February 2018 which were notable for normal left ejection fraction of 60 to 65%, mild to moderate aortic regurgitation, severely calcified mitral annulus, moderately reduced right ventricular systolic function, and moderately elevated pulmonary peak pressure of 58 mmHg.  A February 2018 TEE was notable for mild mitral valve regurgitation, moderate tricuspid regurgitation, and severely increased pulmonary systolic pressure.     Past Medical History:  Diagnosis Date   Diabetes mellitus without complication ([place])    Peripheral vascular disease ([place])    Plantar fascial fibromatosis     Past Surgical History:  Procedure Laterality Date   ABDOMINAL HYSTERECTOMY     partial   ECTOPIC PREGNANCY SURGERY     JOINT REPLACEMENT Bilateral    TKR   TEE WITHOUT CARDIOVERSION N/A 09/07/2016   Procedure: TRANSESOPHAGEAL ECHOCARDIOGRAM (TEE);  Surgeon: Skeet Latch, MD;  Location: [facility] ENDOSCOPY;  Service: Cardiovascular;  Laterality: N/A;     Home Medications:  Prior to Admission medications   Medication Sig Start Date End Date Taking? Authorizing Provider  aspirin 81 MG chewable tablet Chew 81 mg  by mouth every evening.   Yes [provider]  furosemide (LASIX) 40 MG tablet Take 40 mg by mouth daily.   Yes [provider]  Multiple Vitamins-Minerals (CENTRUM SILVER PO) Take 1 tablet by mouth daily.    Yes [provider]   Polyethyl Glycol-Propyl Glycol (SYSTANE ULTRA) 0.4-0.3 % SOLN Place 2 drops into both eyes 2 (two) times daily as needed (for dry eyes).   Yes [provider]  Potassium Chloride ER 20 MEQ TBCR Take one tablet by mouth once daily for hypokalemia   Yes [provider]  simvastatin (ZOCOR) 40 MG tablet Take 40 mg by mouth daily.    Yes [provider]  verapamil (VERELAN PM) 180 MG 24 hr capsule Take 180 mg by mouth daily.   Yes [provider]  albuterol (PROVENTIL) (2.5 MG/3ML) 0.083% nebulizer solution Take 3 mLs (2.5 mg total) by nebulization every 4 (four) hours as needed for wheezing or shortness of breath. Patient not taking: Reported on 03/24/2022 09/09/16   Robbie Lis, MD    Inpatient Medications: Scheduled Meds:  aspirin  81 mg Oral QPM   carvedilol  3.125 mg Oral BID WC   enoxaparin (LOVENOX) injection  40 mg Subcutaneous Q24H   furosemide  40 mg Intravenous BID   insulin aspart  0-20 Units Subcutaneous TID WC   senna-docusate  1 tablet Oral BID   simvastatin  40 mg Oral Daily   Continuous Infusions:  PRN Meds: albuterol, bisacodyl, hydrALAZINE, HYDROcodone-acetaminophen, HYDROmorphone (DILAUDID) injection, morphine injection, ondansetron (ZOFRAN) IV, senna-docusate  Allergies:   No Known Allergies  Social History:   Social History   Socioeconomic History   Marital status: Widowed    Spouse name: Not on file   Number of children: Not on file   Years of education: Not on file   Highest education level: Not on file  Occupational History   Not on file  Tobacco Use   Smoking status: Never   Smokeless tobacco: Never  Substance and Sexual Activity   Alcohol use: No   Drug use: No   Sexual activity: Not on file  Other Topics Concern   Not on file  Social History Narrative   Not on file   Social Determinants of Health   Financial Resource Strain: Not on file  Food Insecurity: Not on file  Transportation Needs: Not on file   Physical Activity: Not on file  Stress: Not on file  Social Connections: Not on file  Intimate Partner Violence: Not on file    Family History:    Family History  Problem Relation Age of Onset   Hypertension Mother    Hyperlipidemia Mother        before age 59   Heart disease Father    Hypertension Father    Hyperlipidemia Father        before age 67   Diabetes Sister    Other Sister        amputation     ROS:  Please see the history of present illness.   All other ROS reviewed and negative.     Physical Exam/Data:   Vitals:   03/24/22 1149 03/24/22 1415 03/24/22 1430 03/24/22 1445  BP:  120/62 126/62 130/69  Pulse:  100 96 96  Resp:  19  19  Temp:      TempSrc:      SpO2:  94% 93% 98%  Weight: 95.3 kg     Height: '5\' 2"'$  (1.575  m)      No intake or output data in the 24 hours ending 03/24/22 1554    03/24/2022   11:49 AM 10/28/2016   10:10 AM 10/21/2016   11:00 AM  Last 3 Weights  Weight (lbs) 210 lb 201 lb 201 lb  Weight (kg) 95.255 kg 91.173 kg 91.173 kg     Body mass index is 38.41 kg/m.  General:  Well nourished, well developed, in no acute distress HEENT: normal Neck: no JVD, difficult to appreciate with body habitus Vascular: No carotid bruits; Distal pulses 2+ bilaterally Cardiac:  loud systolic murmur noted RUSB and LLSB.  Lungs:  Upper anterior lobes without abnormal sounds. Exam very limited by patient's supine positioning with traction splint in place. Abd: soft, nontender, no hepatomegaly  Ext: bilateral edema, 2+ to knee Musculoskeletal:  BUE strength normal and equal. Left leg in traction Skin: warm and dry  Neuro:  CNs 2-12 intact, no focal abnormalities noted Psych:  Normal affect   EKG:  The EKG was personally reviewed and demonstrates:   Sinus rhythm with prolonged PR interval.  Telemetry:  Telemetry was personally reviewed and demonstrates:  sinus rhythm with long PR interval and PVCs  Relevant CV Studies:  09/03/16 TTE:  - Left  ventricle: The cavity size was normal. Wall thickness was increased in a pattern of mild LVH. Systolic function was normal.    The estimated ejection fraction was in the range of 60% to 65%.    Wall motion was normal; there were no regional wall motion    abnormalities. Doppler parameters are consistent with abnormal left ventricular relaxation (grade 1 diastolic dysfunction).  - Aortic valve: Mildly to moderately calcified annulus. There was mild to moderate regurgitation.  - Mitral valve: Severely calcified annulus.  - Left atrium: The atrium was mildly dilated.  - Right ventricle: Systolic function was moderately reduced.  - Pulmonary arteries: Systolic pressure was moderately increased.    PA peak pressure: 58 mm Hg (S).   09/07/16 TEE:  - Left ventricle: There was mild concentric hypertrophy. Systolic    function was normal. The estimated ejection fraction was in the range of 60% to 65%. Wall motion was normal; there were no regional wall motion abnormalities.  - Mitral valve: Severely calcified annulus. There was mild    regurgitation.  - Left atrium: No evidence of thrombus in the atrial cavity or    appendage. No evidence of thrombus in the atrial cavity or    appendage.  - Right ventricle: The cavity size was normal. Wall thickness was normal. Systolic function was normal.  - Right atrium: No evidence of thrombus in the atrial cavity or    appendage.  - Tricuspid valve: There was moderate regurgitation.  - Pulmonary arteries: Systolic pressure was severely increased.     Laboratory Data:  High Sensitivity Troponin:  No results for input(s): "TROPONINIHS" in the last 720 hours.   Chemistry Recent Labs  Lab 03/24/22 1400  NA 140  K 4.4  CL 102  CO2 28  GLUCOSE 205*  BUN 13  CREATININE 0.64  CALCIUM 9.0  GFRNONAA >60  ANIONGAP 10    No results for input(s): "PROT", "ALBUMIN", "AST", "ALT", "ALKPHOS", "BILITOT" in the last 168 hours. Lipids No results for input(s):  "CHOL", "TRIG", "HDL", "LABVLDL", "LDLCALC", "CHOLHDL" in the last 168 hours.  Hematology Recent Labs  Lab 03/24/22 1400  WBC 6.0  RBC 4.14  HGB 12.0  HCT 38.2  MCV 92.3  MCH 29.0  MCHC 31.4  RDW 12.1  PLT 165   Thyroid No results for input(s): "TSH", "FREET4" in the last 168 hours.  BNPNo results for input(s): "BNP", "PROBNP" in the last 168 hours.  DDimer No results for input(s): "DDIMER" in the last 168 hours.   Radiology/Studies:  DG Pelvis 1-2 Views  Result Date: 03/24/2022 CLINICAL DATA:  [place] out of bed yesterday. Hip pain. Left knee pain. EXAM: PELVIS - 1-2 VIEW COMPARISON:  None Available. FINDINGS: Advanced arthropathic changes of the left hip, with marked concentric joint space narrowing, subchondral sclerosis and prominent osteophytes. Femoral neck appears aligned with the more superior aspect of the femoral head, but there is no defined fracture line. There is surrounding heterotopic bone formation. All these findings are likely chronic. Mild superolateral right hip joint space narrowing. SI joints and pubic symphysis are normally spaced and aligned. No bone lesion. No definite acute fracture. IMPRESSION: 1. No convincing acute fracture, although a subtle fracture of the proximal left femur is difficult to exclude due to the extensive arthropathic changes and heterotopic bone formation. If there is significant clinical suspicion for a proximal left femur fracture, recommend follow-up left hip MRI versus high-resolution unenhanced CT. Electronically Signed   By: Lajean Manes M.D.   On: 03/24/2022 12:55     Assessment and Plan:    Acute on Chronic HFpEF (decompensated) Pulmonary hypertension Hypertension  Patient with pulmonary hypertension since at least 2018 has been off lasix for several months. Despite this, Toni [name] denies a significant increase in dyspnea though her limited baseline mobility makes functional status and exertional tolerance assessment difficult. At this  point, there are no cardiac interventions that will improve this patient's surgical risk.   Recommend optimizing fluid status prior to any surgical intervention. Use caution with any fluid administration. TTE ordered to assess current LVEF and degree of RV dysfunction. BNP ordered. Elevated BNP would be an indication for PACU EKG and troponin trending.  Patient would be a good candidate for future consideration of MRA and/or SGLT2 HFpEF and HTN.   Insulin Dependent DM with hyperglycemia  Glucose is 205 on admission BMP, A1c is pending.  Continue sliding scale insulin per primary team.  Prolonged PR/QT  Patient with long PR and QT on telemetry and EKG. Both of these are noted on EKGs from previous admissions.  Femur fracture, left Preop evaluation Surgery with timing per ortho recs.  Antiplatelet and/or Anticoagulation Recommendations: Patient without known CAD. Based on age, would be okay to hold ASA in perioperative period. USPSTF recommends against daily '81mg'$  ASA for primary prevention of CVD in adults 60 years or older.    Risk Assessment/Risk Scores:    New York Heart Association (NYHA) Functional Class NYHA Class II-III (difficult to assess with limited activity not clearly due to HF symptoms)   Toni [name]'s perioperative risk of a major cardiac event is 6.6% according to the Revised Cardiac Risk Index (RCRI).  Therefore, Toni [name] is at moderate to high risk for perioperative complications.   Her functional capacity is poor at 2.74 METs according to the Duke Activity Status Index (DASI).     For questions or updates, please contact [place] Please consult [email or web address] for contact info under    Signed, Lily Kocher, PA-C  03/24/2022 3:54 PM  I have examined the patient and reviewed assessment and plan and discussed with patient.  Agree with above as stated.    Patient with known pulmonary hypertension and RV systolic dysfunction who now has a hip  fracture.  Toni [name]  denies any anginal symptoms.  Exam shows lower extremity swelling which is likely more related to RV failure.  Toni [name] has had normal LV function.  We cannot lower her cardiac risk with any further testing or intervention.  Would try to avoid excessive IV fluids to prevent volume overload.  Please let us know if there are any perioperative complications.  No further cardiac testing needed prior to surgery.  Larae Grooms

## 2022-03-24 NOTE — ED Notes (Signed)
Patient Consent for surgical procedure filled out at bedside. Pt verbalized understanding and requested daughter (POA) to sign. Signature obtained and witnessed by this RN.

## 2022-03-24 NOTE — Consult Note (Signed)
Reason for Consult:Left femur fx Referring Physician: Pattricia Boss Time called: [id number] Time at bedside: Toni [name] is an 88 y.o. female.  HPI: Toni [name] was getting up to use the [facility]. She slipped out of her clogs and then slipped on the carpet and fell. She had immediate left thigh pain and could not get up. She was brought to the ED where x-rays showed a left periprosthetic femur fx and orthopedic surgery was consulted. She lives at home and mostly doesn't ambulate though does take a few steps with a RW for toileting, etc.  Past Medical History:  Diagnosis Date   Diabetes mellitus without complication ([place])    Peripheral vascular disease ([place])    Plantar fascial fibromatosis     Past Surgical History:  Procedure Laterality Date   ABDOMINAL HYSTERECTOMY     partial   ECTOPIC PREGNANCY SURGERY     JOINT REPLACEMENT Bilateral    TKR   TEE WITHOUT CARDIOVERSION N/A 09/07/2016   Procedure: TRANSESOPHAGEAL ECHOCARDIOGRAM (TEE);  Surgeon: Skeet Latch, MD;  Location: [facility] ENDOSCOPY;  Service: Cardiovascular;  Laterality: N/A;    Family History  Problem Relation Age of Onset   Hypertension Mother    Hyperlipidemia Mother        before age 58   Heart disease Father    Hypertension Father    Hyperlipidemia Father        before age 38   Diabetes Sister    Other Sister        amputation    Social History:  reports that she has never smoked. She has never used smokeless tobacco. She reports that she does not drink alcohol and does not use drugs.  Allergies: No Known Allergies  Medications: I have reviewed the patient's current medications.  No results found for this or any previous visit (from the past 48 hour(s)).  DG Pelvis 1-2 Views  Result Date: 03/24/2022 CLINICAL DATA:  [place] out of bed yesterday. Hip pain. Left knee pain. EXAM: PELVIS - 1-2 VIEW COMPARISON:  None Available. FINDINGS: Advanced arthropathic changes of the left hip, with marked concentric joint space  narrowing, subchondral sclerosis and prominent osteophytes. Femoral neck appears aligned with the more superior aspect of the femoral head, but there is no defined fracture line. There is surrounding heterotopic bone formation. All these findings are likely chronic. Mild superolateral right hip joint space narrowing. SI joints and pubic symphysis are normally spaced and aligned. No bone lesion. No definite acute fracture. IMPRESSION: 1. No convincing acute fracture, although a subtle fracture of the proximal left femur is difficult to exclude due to the extensive arthropathic changes and heterotopic bone formation. If there is significant clinical suspicion for a proximal left femur fracture, recommend follow-up left hip MRI versus high-resolution unenhanced CT. Electronically Signed   By: Lajean Manes M.D.   On: 03/24/2022 12:55    Review of Systems  HENT:  Negative for ear discharge, ear pain, hearing loss and tinnitus.   Eyes:  Negative for photophobia and pain.  Respiratory:  Negative for cough and shortness of breath.   Cardiovascular:  Negative for chest pain.  Gastrointestinal:  Negative for abdominal pain, nausea and vomiting.  Genitourinary:  Negative for dysuria, flank pain, frequency and urgency.  Musculoskeletal:  Positive for arthralgias (Left leg). Negative for back pain, myalgias and neck pain.  Neurological:  Negative for dizziness and headaches.  Hematological:  Does not bruise/bleed easily.  Psychiatric/Behavioral:  The patient is not nervous/anxious.  Blood pressure (!) 165/96, pulse (!) 101, temperature 98.4 F (36.9 C), temperature source Oral, resp. rate 18, height '5\' 2"'$  (1.575 m), weight 95.3 kg, SpO2 99 %. Physical Exam Constitutional:      General: She is not in acute distress.    Appearance: She is well-developed. She is not diaphoretic.  HENT:     Head: Normocephalic and atraumatic.  Eyes:     General: No scleral icterus.       Right eye: No discharge.         Left eye: No discharge.     Conjunctiva/sclera: Conjunctivae normal.  Cardiovascular:     Rate and Rhythm: Normal rate and regular rhythm.  Pulmonary:     Effort: Pulmonary effort is normal. No respiratory distress.  Musculoskeletal:     Cervical back: Normal range of motion.     Comments: LLE No traumatic wounds, ecchymosis, or rash  Mild TTP thigh  No knee or ankle effusion  Knee stable to varus/ valgus and anterior/posterior stress  Sens DPN, SPN, TN intact  Motor EHL, ext, flex, evers 5/5  DP 1+, PT 1+, 3+ NP edema  Skin:    General: Skin is warm and dry.  Neurological:     Mental Status: She is alert.  Psychiatric:        Mood and Affect: Mood normal.        Behavior: Behavior normal.     Assessment/Plan: Left femur fx -- Plan ORIF tomorrow with Dr. Marcelino Scot. Please keep NPO after MN. Multiple medical problems including PVD and DM -- per primary service    Toni Abu, PA-C Orthopedic Surgery [phone] 03/24/2022, 2:09 PM

## 2022-03-25 ENCOUNTER — Inpatient Hospital Stay (HOSPITAL_COMMUNITY): Payer: Medicare Other

## 2022-03-25 ENCOUNTER — Inpatient Hospital Stay (HOSPITAL_COMMUNITY): Payer: Medicare Other | Admitting: Certified Registered Nurse Anesthetist

## 2022-03-25 ENCOUNTER — Other Ambulatory Visit: Payer: Self-pay

## 2022-03-25 ENCOUNTER — Encounter (HOSPITAL_COMMUNITY)
Admission: EM | Disposition: A | Discharge status: Skilled Nursing Facility | Payer: Self-pay | Source: Home / Self Care | Attending: Internal Medicine

## 2022-03-25 ENCOUNTER — Encounter (HOSPITAL_COMMUNITY): Payer: Self-pay | Admitting: Internal Medicine

## 2022-03-25 ENCOUNTER — Other Ambulatory Visit (HOSPITAL_COMMUNITY): Payer: Medicare Other

## 2022-03-25 DIAGNOSIS — I110 Hypertensive heart disease with heart failure: Secondary | ICD-10-CM | POA: Diagnosis not present

## 2022-03-25 DIAGNOSIS — I509 Heart failure, unspecified: Secondary | ICD-10-CM | POA: Diagnosis not present

## 2022-03-25 DIAGNOSIS — I5033 Acute on chronic diastolic (congestive) heart failure: Secondary | ICD-10-CM

## 2022-03-25 DIAGNOSIS — S7292XA Unspecified fracture of left femur, initial encounter for closed fracture: Secondary | ICD-10-CM | POA: Diagnosis not present

## 2022-03-25 DIAGNOSIS — M84352A Stress fracture, left femur, initial encounter for fracture: Secondary | ICD-10-CM | POA: Diagnosis not present

## 2022-03-25 DIAGNOSIS — E119 Type 2 diabetes mellitus without complications: Secondary | ICD-10-CM

## 2022-03-25 DIAGNOSIS — M9712XA Periprosthetic fracture around internal prosthetic left knee joint, initial encounter: Secondary | ICD-10-CM | POA: Diagnosis not present

## 2022-03-25 HISTORY — PX: ORIF FEMUR FRACTURE: SHX2119

## 2022-03-25 LAB — COMPREHENSIVE METABOLIC PANEL
ALT: 13 U/L (ref 0–44)
AST: 20 U/L (ref 15–41)
Albumin: 2.9 g/dL — ABNORMAL LOW (ref 3.5–5.0)
Alkaline Phosphatase: 68 U/L (ref 38–126)
Anion gap: 10 (ref 5–15)
BUN: 13 mg/dL (ref 8–23)
CO2: 27 mmol/L (ref 22–32)
Calcium: 8.8 mg/dL — ABNORMAL LOW (ref 8.9–10.3)
Chloride: 103 mmol/L (ref 98–111)
Creatinine, Ser: 0.61 mg/dL (ref 0.44–1.00)
GFR, Estimated: >60 mL/min (ref >60)
Glucose, Bld: 163 mg/dL — ABNORMAL HIGH (ref 70–99)
Potassium: 4.5 mmol/L (ref 3.5–5.1)
Sodium: 140 mmol/L (ref 135–145)
Total Bilirubin: 0.6 mg/dL (ref 0.3–1.2)
Total Protein: 6 g/dL — ABNORMAL LOW (ref 6.5–8.1)

## 2022-03-25 LAB — CBC
HCT: 36.4 % (ref 36.0–46.0)
Hemoglobin: 11.1 g/dL — ABNORMAL LOW (ref 12.0–15.0)
MCH: 28.7 pg (ref 26.0–34.0)
MCHC: 30.5 g/dL (ref 30.0–36.0)
MCV: 94.1 fL (ref 80.0–100.0)
Platelets: 162 10*3/uL (ref 150–400)
RBC: 3.87 MIL/uL (ref 3.87–5.11)
RDW: 12.3 % (ref 11.5–15.5)
WBC: 6.1 10*3/uL (ref 4.0–10.5)
nRBC: 0 % (ref 0.0–0.2)

## 2022-03-25 LAB — CBG MONITORING, ED
Glucose-Capillary: 156 mg/dL — ABNORMAL HIGH (ref 70–99)
Glucose-Capillary: 137 mg/dL — ABNORMAL HIGH (ref 70–99)

## 2022-03-25 LAB — GLUCOSE, CAPILLARY
Glucose-Capillary: 113 mg/dL — ABNORMAL HIGH (ref 70–99)
Glucose-Capillary: 131 mg/dL — ABNORMAL HIGH (ref 70–99)
Glucose-Capillary: 157 mg/dL — ABNORMAL HIGH (ref 70–99)
Glucose-Capillary: 219 mg/dL — ABNORMAL HIGH (ref 70–99)

## 2022-03-25 LAB — MAGNESIUM: Magnesium: 1.9 mg/dL (ref 1.7–2.4)

## 2022-03-25 LAB — PHOSPHORUS: Phosphorus: 3.3 mg/dL (ref 2.5–4.6)

## 2022-03-25 SURGERY — OPEN REDUCTION INTERNAL FIXATION (ORIF) DISTAL FEMUR FRACTURE
Anesthesia: Monitor Anesthesia Care | Site: Leg Upper | Laterality: Left

## 2022-03-25 MED ORDER — ALBUMIN HUMAN 5 % IV SOLN: INTRAVENOUS | Status: DC | PRN

## 2022-03-25 MED ORDER — FENTANYL CITRATE (PF) 100 MCG/2ML IJ SOLN
INTRAMUSCULAR | Status: AC
  Administered 2022-03-25: 50 ug
  Filled 2022-03-25: qty 2

## 2022-03-25 MED ORDER — ONDANSETRON HCL 4 MG/2ML IJ SOLN
INTRAMUSCULAR | Status: DC | PRN
  Administered 2022-03-25: 4 mg via INTRAVENOUS

## 2022-03-25 MED ORDER — PROPOFOL 10 MG/ML IV BOLUS
INTRAVENOUS | Status: DC | PRN
  Administered 2022-03-25: 80 mg via INTRAVENOUS

## 2022-03-25 MED ORDER — ROPIVACAINE HCL 5 MG/ML IJ SOLN
INTRAMUSCULAR | Status: DC | PRN
  Administered 2022-03-25: 30 mL via PERINEURAL

## 2022-03-25 MED ORDER — ROCURONIUM BROMIDE 10 MG/ML (PF) SYRINGE
PREFILLED_SYRINGE | INTRAVENOUS | Status: AC
  Filled 2022-03-25: qty 10

## 2022-03-25 MED ORDER — DEXAMETHASONE SODIUM PHOSPHATE 10 MG/ML IJ SOLN
INTRAMUSCULAR | Status: DC | PRN
  Administered 2022-03-25: 5 mg via INTRAVENOUS

## 2022-03-25 MED ORDER — SUGAMMADEX SODIUM 200 MG/2ML IV SOLN
INTRAVENOUS | Status: DC | PRN
  Administered 2022-03-25: 200 mg via INTRAVENOUS

## 2022-03-25 MED ORDER — METOCLOPRAMIDE HCL 5 MG/ML IJ SOLN: 5.0000 mg | Freq: Three times a day (TID) | INTRAMUSCULAR | Status: DC | PRN

## 2022-03-25 MED ORDER — BUPIVACAINE HCL (PF) 0.25 % IJ SOLN
INTRAMUSCULAR | Status: AC
  Filled 2022-03-25: qty 30

## 2022-03-25 MED ORDER — DOCUSATE SODIUM 100 MG PO CAPS
100.0000 mg | ORAL_CAPSULE | Freq: Two times a day (BID) | ORAL | Status: DC
  Administered 2022-03-25 – 2022-03-29 (×8): 100 mg via ORAL
  Filled 2022-03-25 (×8): qty 1

## 2022-03-25 MED ORDER — PROPOFOL 10 MG/ML IV BOLUS
INTRAVENOUS | Status: AC
  Filled 2022-03-25: qty 20

## 2022-03-25 MED ORDER — METOCLOPRAMIDE HCL 5 MG PO TABS: 5.0000 mg | ORAL_TABLET | Freq: Three times a day (TID) | ORAL | Status: DC | PRN

## 2022-03-25 MED ORDER — FENTANYL CITRATE (PF) 100 MCG/2ML IJ SOLN: 25.0000 ug | INTRAMUSCULAR | Status: DC | PRN

## 2022-03-25 MED ORDER — LIDOCAINE 2% (20 MG/ML) 5 ML SYRINGE
INTRAMUSCULAR | Status: AC
  Filled 2022-03-25: qty 5

## 2022-03-25 MED ORDER — ORAL CARE MOUTH RINSE: 15.0000 mL | Freq: Once | OROMUCOSAL | Status: AC

## 2022-03-25 MED ORDER — CEFAZOLIN SODIUM-DEXTROSE 2-4 GM/100ML-% IV SOLN
2.0000 g | INTRAVENOUS | Status: AC
  Administered 2022-03-25: 2 g via INTRAVENOUS
  Filled 2022-03-25: qty 100

## 2022-03-25 MED ORDER — ENOXAPARIN SODIUM 40 MG/0.4ML IJ SOSY
40.0000 mg | PREFILLED_SYRINGE | INTRAMUSCULAR | Status: DC
  Administered 2022-03-26: 40 mg via SUBCUTANEOUS
  Filled 2022-03-25: qty 0.4

## 2022-03-25 MED ORDER — OXYCODONE HCL 5 MG/5ML PO SOLN: 5.0000 mg | Freq: Once | ORAL | Status: DC | PRN

## 2022-03-25 MED ORDER — PHENYLEPHRINE 80 MCG/ML (10ML) SYRINGE FOR IV PUSH (FOR BLOOD PRESSURE SUPPORT)
PREFILLED_SYRINGE | INTRAVENOUS | Status: DC | PRN
  Administered 2022-03-25 (×2): 80 ug via INTRAVENOUS

## 2022-03-25 MED ORDER — CHLORHEXIDINE GLUCONATE 0.12 % MT SOLN
OROMUCOSAL | Status: AC
  Administered 2022-03-25: 15 mL via OROMUCOSAL
  Filled 2022-03-25: qty 15

## 2022-03-25 MED ORDER — CHLORHEXIDINE GLUCONATE 4 % EX LIQD: 60.0000 mL | Freq: Once | CUTANEOUS | Status: DC

## 2022-03-25 MED ORDER — CEFAZOLIN SODIUM-DEXTROSE 2-4 GM/100ML-% IV SOLN
2.0000 g | Freq: Four times a day (QID) | INTRAVENOUS | Status: AC
  Administered 2022-03-25 – 2022-03-26 (×3): 2 g via INTRAVENOUS
  Filled 2022-03-25 (×3): qty 100

## 2022-03-25 MED ORDER — VANCOMYCIN HCL 1000 MG IV SOLR
INTRAVENOUS | Status: DC | PRN
  Administered 2022-03-25: 1000 mg

## 2022-03-25 MED ORDER — LIDOCAINE 2% (20 MG/ML) 5 ML SYRINGE
INTRAMUSCULAR | Status: DC | PRN
  Administered 2022-03-25: 60 mg via INTRAVENOUS

## 2022-03-25 MED ORDER — FENTANYL CITRATE (PF) 250 MCG/5ML IJ SOLN
INTRAMUSCULAR | Status: DC | PRN
Start: 2022-03-25 — End: 2022-03-25
  Administered 2022-03-25 (×2): 50 ug via INTRAVENOUS

## 2022-03-25 MED ORDER — ACETAMINOPHEN 500 MG PO TABS
1000.0000 mg | ORAL_TABLET | ORAL | Status: AC
  Administered 2022-03-25: 1000 mg via ORAL
  Filled 2022-03-25: qty 2

## 2022-03-25 MED ORDER — ONDANSETRON HCL 4 MG PO TABS: 4.0000 mg | ORAL_TABLET | Freq: Four times a day (QID) | ORAL | Status: DC | PRN

## 2022-03-25 MED ORDER — PHENYLEPHRINE 80 MCG/ML (10ML) SYRINGE FOR IV PUSH (FOR BLOOD PRESSURE SUPPORT)
PREFILLED_SYRINGE | INTRAVENOUS | Status: AC
  Filled 2022-03-25: qty 10

## 2022-03-25 MED ORDER — ONDANSETRON HCL 4 MG/2ML IJ SOLN
4.0000 mg | Freq: Four times a day (QID) | INTRAMUSCULAR | Status: DC | PRN
  Administered 2022-03-26 (×2): 4 mg via INTRAVENOUS
  Filled 2022-03-25 (×2): qty 2

## 2022-03-25 MED ORDER — 0.9 % SODIUM CHLORIDE (POUR BTL) OPTIME
TOPICAL | Status: DC | PRN
  Administered 2022-03-25: 1000 mL

## 2022-03-25 MED ORDER — OXYCODONE HCL 5 MG PO TABS: 5.0000 mg | ORAL_TABLET | Freq: Once | ORAL | Status: DC | PRN

## 2022-03-25 MED ORDER — LACTATED RINGERS IV SOLN: INTRAVENOUS | Status: DC

## 2022-03-25 MED ORDER — PHENYLEPHRINE HCL-NACL 20-0.9 MG/250ML-% IV SOLN
INTRAVENOUS | Status: DC | PRN
  Administered 2022-03-25: 40 ug/min via INTRAVENOUS

## 2022-03-25 MED ORDER — SUCCINYLCHOLINE CHLORIDE 200 MG/10ML IV SOSY
PREFILLED_SYRINGE | INTRAVENOUS | Status: DC | PRN
  Administered 2022-03-25: 140 mg via INTRAVENOUS

## 2022-03-25 MED ORDER — VANCOMYCIN HCL 1000 MG IV SOLR
INTRAVENOUS | Status: AC
  Filled 2022-03-25: qty 20

## 2022-03-25 MED ORDER — DEXAMETHASONE SODIUM PHOSPHATE 10 MG/ML IJ SOLN
INTRAMUSCULAR | Status: DC | PRN
  Administered 2022-03-25: 10 mg

## 2022-03-25 MED ORDER — POVIDONE-IODINE 10 % EX SWAB
2.0000 | Freq: Once | CUTANEOUS | Status: AC
  Administered 2022-03-25: 2 via TOPICAL

## 2022-03-25 MED ORDER — CHLORHEXIDINE GLUCONATE 0.12 % MT SOLN
15.0000 mL | Freq: Once | OROMUCOSAL | Status: AC
  Filled 2022-03-25: qty 15

## 2022-03-25 MED ORDER — FENTANYL CITRATE (PF) 100 MCG/2ML IJ SOLN: 50.0000 ug | Freq: Once | INTRAMUSCULAR | Status: DC

## 2022-03-25 MED ORDER — INSULIN ASPART 100 UNIT/ML IJ SOLN: 0.0000 [IU] | INTRAMUSCULAR | Status: DC | PRN

## 2022-03-25 MED ORDER — ROCURONIUM BROMIDE 10 MG/ML (PF) SYRINGE
PREFILLED_SYRINGE | INTRAVENOUS | Status: DC | PRN
  Administered 2022-03-25: 20 mg via INTRAVENOUS
  Administered 2022-03-25: 50 mg via INTRAVENOUS
  Administered 2022-03-25: 20 mg via INTRAVENOUS

## 2022-03-25 MED ORDER — ONDANSETRON HCL 4 MG/2ML IJ SOLN
4.0000 mg | Freq: Once | INTRAMUSCULAR | Status: DC | PRN
Start: 2022-03-25 — End: 2022-03-25

## 2022-03-25 MED ORDER — ONDANSETRON HCL 4 MG/2ML IJ SOLN
INTRAMUSCULAR | Status: AC
  Filled 2022-03-25: qty 2

## 2022-03-25 MED ORDER — DEXAMETHASONE SODIUM PHOSPHATE 10 MG/ML IJ SOLN
INTRAMUSCULAR | Status: AC
  Filled 2022-03-25: qty 1

## 2022-03-25 MED ORDER — FENTANYL CITRATE (PF) 250 MCG/5ML IJ SOLN
INTRAMUSCULAR | Status: AC
  Filled 2022-03-25: qty 5

## 2022-03-25 SURGICAL SUPPLY — 86 items
BAG COUNTER SPONGE SURGICOUNT (BAG) ×1 IMPLANT
BIT DRILL 4.3 (BIT) ×1
BIT DRILL 4.3X300MM (BIT) IMPLANT
BIT DRILL LONG 3.3 (BIT) IMPLANT
BIT DRILL QC 3.3X195 (BIT) IMPLANT
BLADE CLIPPER SURG (BLADE) IMPLANT
BNDG COHESIVE 6X5 TAN NS LF (GAUZE/BANDAGES/DRESSINGS) IMPLANT
BNDG ELASTIC 4X5.8 VLCR STR LF (GAUZE/BANDAGES/DRESSINGS) ×1 IMPLANT
BNDG ELASTIC 6X5.8 VLCR STR LF (GAUZE/BANDAGES/DRESSINGS) ×1 IMPLANT
BNDG GAUZE DERMACEA FLUFF 4 (GAUZE/BANDAGES/DRESSINGS) ×1 IMPLANT
BRUSH SCRUB EZ PLAIN DRY (MISCELLANEOUS) ×2 IMPLANT
CANISTER SUCT 3000ML PPV (MISCELLANEOUS) ×1 IMPLANT
CAP LOCK NCB (Cap) IMPLANT
COVER SURGICAL LIGHT HANDLE (MISCELLANEOUS) ×1 IMPLANT
DRAPE C-ARM 42X72 X-RAY (DRAPES) ×1 IMPLANT
DRAPE C-ARMOR (DRAPES) ×1 IMPLANT
DRAPE IMP U-DRAPE 54X76 (DRAPES) ×1 IMPLANT
DRAPE ORTHO SPLIT 77X108 STRL (DRAPES) ×2
DRAPE SURG ORHT 6 SPLT 77X108 (DRAPES) ×3 IMPLANT
DRAPE U-SHAPE 47X51 STRL (DRAPES) ×1 IMPLANT
DRESSING MEPILEX FLEX 4X4 (GAUZE/BANDAGES/DRESSINGS) IMPLANT
DRSG ADAPTIC 3X8 NADH LF (GAUZE/BANDAGES/DRESSINGS) ×1 IMPLANT
DRSG AQUACEL AG ADV 3.5X10 (GAUZE/BANDAGES/DRESSINGS) IMPLANT
DRSG AQUACEL AG ADV 3.5X14 (GAUZE/BANDAGES/DRESSINGS) IMPLANT
DRSG MEPILEX BORDER 4X8 (GAUZE/BANDAGES/DRESSINGS) IMPLANT
DRSG MEPILEX FLEX 4X4 (GAUZE/BANDAGES/DRESSINGS) ×1
ELECT REM PT RETURN 9FT ADLT (ELECTROSURGICAL) ×1
ELECTRODE REM PT RTRN 9FT ADLT (ELECTROSURGICAL) ×1 IMPLANT
EVACUATOR 1/8 PVC DRAIN (DRAIN) IMPLANT
EVACUATOR 3/16  PVC DRAIN (DRAIN)
EVACUATOR 3/16 PVC DRAIN (DRAIN) IMPLANT
GAUZE PAD ABD 8X10 STRL (GAUZE/BANDAGES/DRESSINGS) ×4 IMPLANT
GAUZE SPONGE 4X4 12PLY STRL (GAUZE/BANDAGES/DRESSINGS) ×1 IMPLANT
GLOVE BIO SURGEON STRL SZ7.5 (GLOVE) ×1 IMPLANT
GLOVE BIO SURGEON STRL SZ8 (GLOVE) ×1 IMPLANT
GLOVE BIOGEL PI IND STRL 7.5 (GLOVE) ×1 IMPLANT
GLOVE BIOGEL PI IND STRL 8 (GLOVE) ×1 IMPLANT
GLOVE SURG ORTHO LTX SZ7.5 (GLOVE) ×2 IMPLANT
GOWN STRL REUS W/ TWL LRG LVL3 (GOWN DISPOSABLE) ×2 IMPLANT
GOWN STRL REUS W/ TWL XL LVL3 (GOWN DISPOSABLE) ×1 IMPLANT
GOWN STRL REUS W/TWL LRG LVL3 (GOWN DISPOSABLE) ×2
GOWN STRL REUS W/TWL XL LVL3 (GOWN DISPOSABLE) ×1
K-WIRE 2.0 (WIRE) ×1
K-WIRE FXSTD 280X2XNS SS (WIRE) ×1
KIT BASIN OR (CUSTOM PROCEDURE TRAY) ×1 IMPLANT
KIT TURNOVER KIT B (KITS) ×1 IMPLANT
KWIRE FXSTD 280X2XNS SS (WIRE) IMPLANT
NDL 22X1.5 STRL (OR ONLY) (MISCELLANEOUS) IMPLANT
NEEDLE 22X1.5 STRL (OR ONLY) (MISCELLANEOUS) IMPLANT
NS IRRIG 1000ML POUR BTL (IV SOLUTION) ×1 IMPLANT
PACK TOTAL JOINT (CUSTOM PROCEDURE TRAY) ×1 IMPLANT
PACK UNIVERSAL I (CUSTOM PROCEDURE TRAY) ×1 IMPLANT
PAD ARMBOARD 7.5X6 YLW CONV (MISCELLANEOUS) ×2 IMPLANT
PAD CAST 4YDX4 CTTN HI CHSV (CAST SUPPLIES) ×1 IMPLANT
PADDING CAST COTTON 4X4 STRL (CAST SUPPLIES)
PADDING CAST COTTON 6X4 STRL (CAST SUPPLIES) ×1 IMPLANT
PLATE NCB 15H HIP (Plate) IMPLANT
SCREW 5.0 32MM (Screw) IMPLANT
SCREW 5.0 60MM (Screw) IMPLANT
SCREW 5.0 80MM (Screw) IMPLANT
SCREW HUM NCB PA ST 4X60 (Screw) IMPLANT
SCREW NCB 3.5X75X5X6.2XST (Screw) IMPLANT
SCREW NCB 4.0MX44M (Screw) IMPLANT
SCREW NCB 4.0X36MM (Screw) IMPLANT
SCREW NCB 4.0X40MM (Screw) IMPLANT
SCREW NCB 5.0X36MM (Screw) IMPLANT
SCREW NCB 5.0X38 (Screw) IMPLANT
SCREW NCB 5.0X75MM (Screw) ×1 IMPLANT
SCREW NCB 5.0X85MM (Screw) IMPLANT
SPONGE T-LAP 18X18 ~~LOC~~+RFID (SPONGE) ×1 IMPLANT
STAPLER VISISTAT 35W (STAPLE) ×1 IMPLANT
SUCTION FRAZIER HANDLE 10FR (MISCELLANEOUS) ×1
SUCTION TUBE FRAZIER 10FR DISP (MISCELLANEOUS) ×1 IMPLANT
SUT ETHILON 2 0 PSLX (SUTURE) IMPLANT
SUT PROLENE 0 CT 2 (SUTURE) IMPLANT
SUT VIC AB 0 CT1 27 (SUTURE) ×1
SUT VIC AB 0 CT1 27XBRD ANBCTR (SUTURE) ×2 IMPLANT
SUT VIC AB 1 CT1 27 (SUTURE) ×2
SUT VIC AB 1 CT1 27XBRD ANBCTR (SUTURE) ×2 IMPLANT
SUT VIC AB 2-0 CT1 27 (SUTURE) ×2
SUT VIC AB 2-0 CT1 TAPERPNT 27 (SUTURE) ×2 IMPLANT
SYR 20ML ECCENTRIC (SYRINGE) IMPLANT
TOWEL GREEN STERILE (TOWEL DISPOSABLE) ×2 IMPLANT
TOWEL GREEN STERILE FF (TOWEL DISPOSABLE) ×1 IMPLANT
TRAY FOLEY MTR SLVR 16FR STAT (SET/KITS/TRAYS/PACK) IMPLANT
WATER STERILE IRR 1000ML POUR (IV SOLUTION) ×2 IMPLANT

## 2022-03-25 NOTE — Progress Notes (Signed)
I discussed with the patient the risks and benefits of surgery for left femur repair around her total knee stem, including the possibility of infection, nerve injury, vessel injury, wound breakdown, arthritis, symptomatic hardware, DVT/ PE, loss of motion, malunion, nonunion, and need for further surgery among others.  We also specifically discussed the elevated risks of complications because of her age and comorbidities. She acknowledged these risks and wished to proceed.  [name], MD [facility], [facility] [phone]

## 2022-03-25 NOTE — Progress Notes (Addendum)
Orthopedic Tech Progress Note Patient Details:  [name] 1932-10-08 [id number]  Overhead frame with trapeze bar is not permitted for this pt due to restrictions for use: pt age >/= 63.  Patient ID: Toni [name], female   DOB: 1932-10-05, 88 y.o.   MRN: [id number]  Carin [place] 03/25/2022, 5:20 PM

## 2022-03-25 NOTE — ED Notes (Signed)
Pt in room A&O x4 on room air. Pt denies any pain at this time. Pt is in traction on the bed. Updated on plan of care. NV intact distally.

## 2022-03-25 NOTE — Anesthesia Procedure Notes (Signed)
Procedure Name: Intubation Date/Time: 03/25/2022 11:23 AM  Performed by: Carolan Clines, CRNAPre-anesthesia Checklist: Patient identified, Emergency Drugs available, Suction available and Patient being monitored Patient Re-evaluated:Patient Re-evaluated prior to induction Oxygen Delivery Method: Circle System Utilized Preoxygenation: Pre-oxygenation with 100% oxygen Induction Type: IV induction Ventilation: Mask ventilation without difficulty Laryngoscope Size: Glidescope and 4 Grade View: Grade I Tube type: Oral Tube size: 7.0 mm Number of attempts: 1 Airway Equipment and Method: Rigid stylet and Video-laryngoscopy Placement Confirmation: ETT inserted through vocal cords under direct vision, positive ETCO2 and breath sounds checked- equal and bilateral Secured at: 22 cm Tube secured with: Tape Dental Injury: Teeth and Oropharynx as per pre-operative assessment  Difficulty Due To: Difficulty was anticipated and Difficult Airway- due to reduced neck mobility

## 2022-03-25 NOTE — Anesthesia Procedure Notes (Signed)
Anesthesia Regional Block: Femoral nerve block   Pre-Anesthetic Checklist: , timeout performed,  Correct Patient, Correct Site, Correct Laterality,  Correct Procedure, Correct Position, site marked,  Risks and benefits discussed,  Surgical consent,  Pre-op evaluation,  At surgeon's request and post-op pain management  Laterality: Left  Prep: Maximum Sterile Barrier Precautions used, chloraprep       Needles:  Injection technique: Single-shot  Needle Type: Echogenic Stimulator Needle     Needle Length: 9cm  Needle Gauge: 22     Additional Needles:   Procedures:,,,, ultrasound used (permanent image in chart),,    Narrative:  Start time: 03/25/2022 10:20 AM End time: 03/25/2022 10:30 AM Injection made incrementally with aspirations every 5 mL.  Performed by: Personally  Anesthesiologist: Pervis Hocking, DO  Additional Notes: Monitors applied. No increased pain on injection. No increased resistance to injection. Injection made in 5cc increments. Good needle visualization. Patient tolerated procedure well.

## 2022-03-25 NOTE — Anesthesia Preprocedure Evaluation (Addendum)
Anesthesia Evaluation  Patient identified by MRN, date of birth, ID band Patient awake    Reviewed: Allergy & Precautions, NPO status , Patient's Chart, lab work & pertinent test results  Airway Mallampati: III  TM Distance: >3 FB Neck ROM: Full    Dental no notable dental hx. (+) Chipped, Poor Dentition, Dental Advisory Given,    Pulmonary neg pulmonary ROS,    Pulmonary exam normal breath sounds clear to auscultation       Cardiovascular METS: < 3 Mets hypertension, Pt. on medications pulmonary hypertension (severe pHTN)+ Peripheral Vascular Disease and +CHF  Normal cardiovascular exam Rhythm:Regular Rate:Normal  Echo 2018 - Left ventricle: There was mild concentric hypertrophy. Systolic  function was normal. The estimated ejection fraction was in the  range of 60% to 65%. Wall motion was normal; there were no  regional wall motion abnormalities.  - Mitral valve: Severely calcified annulus. There was mild  regurgitation.  - Left atrium: No evidence of thrombus in the atrial cavity or  appendage. No evidence of thrombus in the atrial cavity or  appendage.  - Right ventricle: The cavity size was normal. Wall thickness was  normal. Systolic function was normal.  - Right atrium: No evidence of thrombus in the atrial cavity or  appendage.  - Tricuspid valve: There was moderate regurgitation.  - Pulmonary arteries: Systolic pressure was severely increased.    Neuro/Psych negative neurological ROS  negative psych ROS   GI/Hepatic negative GI ROS, Neg liver ROS,   Endo/Other  diabetes, Type 2BMI 38 FS 113  Renal/GU negative Renal ROS  negative genitourinary   Musculoskeletal negative musculoskeletal ROS (+) L femur fx   Abdominal (+) + obese,   Peds negative pediatric ROS (+)  Hematology negative hematology ROS (+) Hb 11, plt 162   Anesthesia Other Findings   Reproductive/Obstetrics negative OB  ROS                            Anesthesia Physical Anesthesia Plan  ASA: 4  Anesthesia Plan: General and Regional   Post-op Pain Management: Regional block* and Tylenol PO (pre-op)*   Induction:   PONV Risk Score and Plan: 3 and Ondansetron and Treatment may vary due to age or medical condition  Airway Management Planned: Oral ETT and Video Laryngoscope Planned  Additional Equipment: None  Intra-op Plan:   Post-operative Plan: Extubation in OR  Informed Consent: I have reviewed the patients History and Physical, chart, labs and discussed the procedure including the risks, benefits and alternatives for the proposed anesthesia with the patient or authorized representative who has indicated his/her understanding and acceptance.     Dental advisory given  Plan Discussed with: CRNA  Anesthesia Plan Comments:        Anesthesia Quick Evaluation

## 2022-03-25 NOTE — Op Note (Signed)
03/25/2022  3:36 PM  PATIENT:  Victorino Sparrow  88 y.o. female  PRE-OPERATIVE DIAGNOSIS:  LEFT PERIPROSTHETIC SUPRACONDYLAR FEMUR FRACTURE  POST-OPERATIVE DIAGNOSIS: LEFT PERIPROSTHETIC SUPRACONDYLAR FEMUR FRACTURE  PROCEDURE:   1. OPEN REDUCTION INTERNAL FIXATION OF LEFT PERIPROSTHETIC FEMUR FRACTURE WITH BIOMET NCB PLATE 2. APPLICATION OF STRESS UNDER FLUOROSCOPY TO THE LEFT KNEE  SURGEON:  Altamese Clayton, MD  PHYSICIAN ASSISTANT: Ainsley Spinner, PA-C  ANESTHESIA:   GENERAL  I/O:  Total I/O In: 0263 [I.V.:1300; IV Piggyback:350] Out: 1150 [Urine:1050; Blood:100]  SPECIMEN:  No Specimen  TOURNIQUET:  NONE  COMPLICATIONS: NONE  DICTATION: Note written in EPIC  DISPOSITION: TO PACU  CONDITION: STABLE  DELAY START OF DVT PROPHYLAXIS BECAUSE OF BLEEDING RISK: NO  BRIEF SUMMARY OF INDICATION FOR PROCEDURE:  CYMONE YESKE is a 88 y.o. who sustained a comminuted supracondylar femur fracture, which was quite distal and near to the femoral implant which had a revision long stem. The risks and benefits of surgery were discussed with the patient, including the possibility of infection, nerve injury, vessel injury, wound breakdown, arthritis, symptomatic hardware, DVT/ PE, loss of motion, malunion, nonunion, heart attack, stroke, death, implant loosening, and need for further surgery among others.  These risks were acknowledged and consent provided to proceed.   BRIEF SUMMARY OF PROCEDURE:  The patient was taken to the operating room where general anesthesia was induced and after receipt of preoperative antibiotics. There was foul smell associated with poor perineal care. Washing of the perineum was performed with the help of multiple assistants and a foley catheter placed, all made more difficult and lengthy because of severe hip arthritis and morbid obesity.  The left lower extremity was prepped and draped in usual sterile fashion.  No tourniquet was used during the procedure.  A  radiolucent triangle was placed underneath the femur and towel bumps to restore appropriate alignment and length. C-arm was brought in to confirm the appropriate position of the distal incision.  This was checked on lateral as well.  An incision was then made.  Dissection was carried down to the IT band.  It was split in line with the incision.  The deep retractor was placed.  Hematoma evacuated. My assistant pulled and maintained traction and dialed in the rotation for alignment.  I then introduced the Biomet NCB plate.  I placed a pin distally parallel to the femoral component and joint line of the tibial tray and then checked the position on both AP and lateral views proximally, placing a single screw. The king tong clamp had to be applied distally to appose the plate to bone. I also applied the synthes colinear clamp and transiently placed a lag screw to maintaine anatomic alignment through the fracture site confirmed on AP and LAT images. Additional screws were placed around the stem distally and four screws total proximally. Release of the clamp and removal of the lag screw allowed translation of 1.5 cm but maintained excellent alignment on both planes and enabled [place] to craft a bridege construct which would be more biologically friendly. Locking caps were placed over the heads of the screws distally and none in the proximal segment, as well, after confirming appropriate position of all screws on orthogonal views. Stress view of the knee following repair did not indicate any instability to varus or valgus force of the total knee implant. Wounds were irrigated thoroughly and then closed in standard layered fashion using #1 Vicryl for the tensor, 0 Vicryl for the deep subcu, 2-0  Vicryl and 3-0 vertical mattress sutures for the skin.  A sterile gently compressive dressing was then applied with an Ace wrap from foot to thigh as well as a knee immobilizer until the patient wakes up adequately from anesthesia  at which time she will be allowed unrestricted range of motion. Ainsley Spinner, PA-C, assisted me throughout as did a PA student and an assistant was necessary to obtain and maintain reduction during provisional and definitive fixation and also assisted with wound closure.   PROGNOSIS:  Because of comorbidities and increased BMI, patient is at increased risk for perioperative complications. PT/ OT to assist with touch down weightbearing and unrestricted range of motion of the knee without bracing.  Formal pharmacologic DVT prophylaxis with Lovenox. F/u in 10-14 days for removal of sutures. Discharge to a SNF is anticipated.     Astrid Divine. Marcelino Scot, M.D.

## 2022-03-25 NOTE — Progress Notes (Signed)
PROGRESS NOTE    Toni [name]  KVQ:[id number] DOB: 08-11-1932 DOA: 03/24/2022 PCP: [facility]   Brief Narrative:  HPI per Dr. Wynetta Fines on 03/24/22 HPI: Toni [name] is a 88 y.o. female with medical history significant of HTN, HLD, IDDM on diet control, morbid obesity, chronic HFpEF, severe pulmonary hypertension, knee OA status post left TKA in 2003, PVD, chronic ambulation dysfunction, presented with mechanical fall and left leg fracture.   Patient has chronic ambulation dysfunction, uses a walker to ambulate, and nighttime use of bedside commode.  Last night, patient woke up to use the commode but she rolled and lost balance and fell on the floor on left leg.  Denies any chest pain lightheadedness of prodromes.   At baseline, patient uses roller walker to ambulate because of chronic knee problems for more than 8 years.  She reported frequent shortness of breath and chronic productive cough " a lot of phlegms" as well as chronic bilateral lower extremity swelling.  She used to be on Lasix but stopped taking "for sometime", she used to be on insulin for her diabetes but at least this year, she has been only on diet control for unclear reasons.  **Interim History  Seen and examined this morning and preop and she is about to go for surgical intervention.  States that she fell yesterday and states that it is "not fun".  Denies any chest pain or shortness breath currently.  No other concerns or complaints this time.  Assessment and Plan:   Acute left femur fracture status post ORIF of the left periprosthetic femur fracture with Biomet NCB plate and application stress under fluoroscopy to the left knee -Scheduled OR tomorrow, Dr. Roosevelt Locks discussed with orthopedic surgery PA, likely will be postponed due to recent block. -Pain control per orthopedic surgery along with DVT prophylaxis -We will need PT OT to further evaluate and treat for safe discharge disposition -She was taken to the OR by Dr.  Marcelino Scot for an open reduction internal fixation of the left periprosthetic femur fracture with Biomet NCB plate and application of stress under fluoroscopy to the left knee -Continue with bowel regimen with bisacodyl 5 mg p.o. daily as needed for moderate constipation or, docusate 100 mg p.o. twice daily, senna docusate 1 tab p.o. twice daily as well as as needed -Continue with pain control with morphine 4 mg IV every 4 as needed and hydromorphone 0.5 mg IV every 2 as needed for severe pain and has hydrocodone-acetaminophen 1 tab p.o. every 6 as needed for moderate pain -Cardiology evaluated preoperatively and they feel that they cannot lower her cardiac risk with any further testing or intervention and are recommending avoiding excessive IV fluids to prevent volume overload and continue monitoring if perioperative complications  Acute on chronic HFpEF decompensation -Significant fluid overload, peripheral edema and bilateral crackles, start IV Lasix 40 mg IV twice daily.  -BNP was elevated at 309 -Echocardiogram ordered and pending -Echocardiogram 2019 showed significant abnormalities with significant severe pulmonary hypertension, and mild to moderate mitral regurgitation.  On physical exam she does have multiple murmurs and S3.  She has poor activity tolerance but significant exertional dyspnea with minimal activity at home.   -Cardiology consulted for further evaluation -Start beta-blocker -She will need strict I's and O's and daily weights; she is +500 mL since admission -Continue to monitor for signs and symptoms of volume overload -DVT prophylaxis per orthopedic surgery now she is on enoxaparin 40 mg subcu every 24 and also on  aspirin 81 mg p.o. daily -Continuing carvedilol 3.125 mg p.o. twice daily   IDDM with hyperglycemia -Not adherent with insulin regimen, reason no known -Start sliding scale -Continue monitor CBGs per protocol; CBGs ranging from 113-137   HTN -On Lasix  -hold off  verapamil, start Coreg -Continue to monitor blood pressures per protocol -Last blood pressure reading was elevated at 139/108   Pulmonary HTN -In decompensation with significant peripheral edema, IV Lasix given -ECHO ordered and pending    Prolonged Qtc -Appears to be a chronic issue, no significant QTc changes compared to EKG in 2018. -Continue Telemetry Monitoring    PVD -CAD equivalent -Considered to be mild on the right side of the leg with the ABI study in 2018 and normal ABI on left side in 2018. -Continue aspirin, outpatient follow-up with PCP.   Morbid Obesity -Complicates overall prognosis and care -Estimated body mass index is 46.61 kg/m as calculated from the following:   Height as of this encounter: '5\' 2"'$  (1.575 m).   Weight as of this encounter: 115.6 kg.  -Weight Loss and Dietary Counseling given  Normocytic Anemia -Patient's hemoglobin/hematocrit went from 12.0/38.2 is now 11.1/36.4 and expected drop postoperatively given her recent surgical intervention -Check anemia panel in the a.m. Continue to monitor for signs of bleeding; no overt bleeding noted -Repeat CBC in a.m.  Hypoalbuminemia -Patient's albumin level is now 2.9 -Continue to monitor and trend and repeat CMP in a.m.  DVT prophylaxis: enoxaparin (LOVENOX) injection 40 mg Start: 03/26/22 0800 SCDs Start: 03/25/22 1705    Code Status: Full Code Family Communication: No family at bedside as she was in the preoperative unit awaiting surgical intervention  Disposition Plan:  Level of care: Telemetry Medical Status is: Inpatient Remains inpatient appropriate because: She needs further evaluation by PT and OT to determine a safe discharge disposition   Consultants:  Cardiology Orthopedic Surgery  Procedures:  Echocardiogram is being ordered  1. OPEN REDUCTION INTERNAL FIXATION OF LEFT PERIPROSTHETIC FEMUR FRACTURE WITH BIOMET NCB PLATE 2. APPLICATION OF STRESS UNDER FLUOROSCOPY TO THE LEFT  KNEE  Antimicrobials:  Anti-infectives (From admission, onward)    Start     Dose/Rate Route Frequency Ordered Stop   03/25/22 1800  ceFAZolin (ANCEF) IVPB 2g/100 mL premix        2 g 200 mL/hr over 30 Minutes Intravenous Every 6 hours 03/25/22 1705 03/26/22 1159   03/25/22 1424  vancomycin (VANCOCIN) powder  Status:  Discontinued          As needed 03/25/22 1424 03/25/22 1507   03/25/22 1000  ceFAZolin (ANCEF) IVPB 2g/100 mL premix        2 g 200 mL/hr over 30 Minutes Intravenous To ShortStay Surgical 03/25/22 0704 03/25/22 1200   03/24/22 1200  cefTRIAXone (ROCEPHIN) 1 g in sodium chloride 0.9 % 100 mL IVPB  Status:  Discontinued        1 g 200 mL/hr over 30 Minutes Intravenous  Once 03/24/22 1149 03/24/22 1220   03/24/22 1200  azithromycin (ZITHROMAX) 500 mg in sodium chloride 0.9 % 250 mL IVPB  Status:  Discontinued        500 mg 250 mL/hr over 60 Minutes Intravenous  Once 03/24/22 1149 03/24/22 1220       Subjective: Seen and examined at bedside and she is awaiting surgical intervention.  Had some hip pain.  No nausea or vomiting.  Denies any shortness of breath.  Feels okay.  No other concerns or complaints at this time.  Objective: Vitals:   03/25/22 1530 03/25/22 1545 03/25/22 1615 03/25/22 1643  BP: (!) 109/58 (!) 113/49 122/62 (!) 139/108  Pulse: 85 90 97 99  Resp: '17 20 20   '$ Temp:  98.7 F (37.1 C)  98.1 F (36.7 C)  TempSrc:      SpO2: 93% 99% 100% 100%  Weight:      Height:        Intake/Output Summary (Last 24 hours) at 03/25/2022 1723 Last data filed at 03/25/2022 1510 Gross per 24 hour  Intake 1650 ml  Output 1150 ml  Net 500 ml   Filed Weights   03/24/22 1149 03/25/22 1518  Weight: 95.3 kg 115.6 kg   Examination: Physical Exam:  Constitutional: WN/WD morbidly obese elderly African-American female currently no acute distress Respiratory: Diminished to auscultation bilaterally, no wheezing, rales, rhonchi or crackles. Normal respiratory effort and  patient is not tachypenic. No accessory muscle use.  Unlabored breathing Cardiovascular: RRR, no murmurs / rubs / gallops. S1 and S2 auscultated.  Has 1+ lower extremity edema Abdomen: Soft, non-tender, stented secondary body habitus. Bowel sounds positive.  GU: Deferred. Musculoskeletal: No clubbing / cyanosis of digits/nails. No joint deformity upper and lower extremities.   Skin: No rashes, lesions, ulcers on limited skin evaluation. No induration; Warm and dry.  Neurologic: CN 2-12 grossly intact with no focal deficits. Romberg sign and cerebellar reflexes not assessed.  Psychiatric: Normal judgment and insight. Alert and oriented x 3. Normal mood and appropriate affect.   Data Reviewed: I have personally reviewed following labs and imaging studies  CBC: Recent Labs  Lab 03/24/22 1400 03/25/22 0235  WBC 6.0 6.1  NEUTROABS 3.9  --   HGB 12.0 11.1*  HCT 38.2 36.4  MCV 92.3 94.1  PLT 165 008   Basic Metabolic Panel: Recent Labs  Lab 03/24/22 1400 03/24/22 1828  NA 140 140  K 4.4 4.5  CL 102 103  CO2 28 27  GLUCOSE 205* 163*  BUN 13 13  CREATININE 0.64 0.61  CALCIUM 9.0 8.8*  MG  --  1.9  PHOS  --  3.3   GFR: Estimated Creatinine Clearance: 59.7 mL/min (by C-G formula based on SCr of 0.61 mg/dL). Liver Function Tests: Recent Labs  Lab 03/24/22 1828  AST 20  ALT 13  ALKPHOS 68  BILITOT 0.6  PROT 6.0*  ALBUMIN 2.9*   No results for input(s): "LIPASE", "AMYLASE" in the last 168 hours. No results for input(s): "AMMONIA" in the last 168 hours. Coagulation Profile: Recent Labs  Lab 03/24/22 1400  INR 1.1   Cardiac Enzymes: No results for input(s): "CKTOTAL", "CKMB", "CKMBINDEX", "TROPONINI" in the last 168 hours. BNP (last 3 results) No results for input(s): "PROBNP" in the last 8760 hours. HbA1C: Recent Labs    03/24/22 1828  HGBA1C 7.8*   CBG: Recent Labs  Lab 03/24/22 1735 03/25/22 0143 03/25/22 0734 03/25/22 0931 03/25/22 1515  GLUCAP 166*  156* 137* 113* 131*   Lipid Profile: No results for input(s): "CHOL", "HDL", "LDLCALC", "TRIG", "CHOLHDL", "LDLDIRECT" in the last 72 hours. Thyroid Function Tests: No results for input(s): "TSH", "T4TOTAL", "FREET4", "T3FREE", "THYROIDAB" in the last 72 hours. Anemia Panel: No results for input(s): "VITAMINB12", "FOLATE", "FERRITIN", "TIBC", "IRON", "RETICCTPCT" in the last 72 hours. Sepsis Labs: No results for input(s): "PROCALCITON", "LATICACIDVEN" in the last 168 hours.  No results found for this or any previous visit (from the past 240 hour(s)).   Radiology Studies: DG FEMUR MIN 2 VIEWS LEFT  Result  Date: 03/25/2022 CLINICAL DATA:  Open reduction internal fixation left femur. EXAM: LEFT FEMUR 2 VIEWS COMPARISON:  Preoperative radiograph yesterday. FINDINGS: Six fluoroscopic spot views of the left femur obtained in the operating room. Lateral plate and multi screw fixation of periprosthetic distal femur fracture. Fluoroscopy time 1 minutes 58 seconds. Dose 33.58 mGy. IMPRESSION: Procedural fluoroscopy for periprosthetic distal femur fracture fixation. Electronically Signed   By: Keith Rake M.D.   On: 03/25/2022 15:11   DG C-Arm 1-60 Min-No Report  Result Date: 03/25/2022 Fluoroscopy was utilized by the requesting physician.  No radiographic interpretation.   DG C-Arm 1-60 Min-No Report  Result Date: 03/25/2022 Fluoroscopy was utilized by the requesting physician.  No radiographic interpretation.   DG Tibia/Fibula Left  Result Date: 03/24/2022 CLINICAL DATA:  Fall from bed. EXAM: LEFT TIBIA AND FIBULA - 2 VIEW COMPARISON:  Femoral radiograph same day FINDINGS: LEFT total knee arthroplasty noted. Extensive heterotopic ossification extending superior to the patella. Bulky osteophytosis along the medial compartment. Fracture of the distal fibula extends towards the lateral femoral condyle. See femoral radiograph. No fracture of the distal tibia. IMPRESSION: 1. Distal femur fracture  extends towards the lateral femoral condyle. Incompletely imaged. 2. No tibia or fibular fracture. Electronically Signed   By: Suzy Bouchard M.D.   On: 03/24/2022 15:46   DG Chest 1 View  Result Date: 03/24/2022 CLINICAL DATA:  CHF EXAM: CHEST  1 VIEW COMPARISON:  September 14, 2016. FINDINGS: Enlarged cardiac silhouette with central vascular prominence. Aortic atherosclerosis. No overt pulmonary edema or focal airspace consolidation. No visible pleural effusion or pneumothorax. Advanced degenerative change of the bilateral shoulders. Thoracic spondylosis. IMPRESSION: Cardiomegaly with central vascular prominence without overt pulmonary edema or focal airspace consolidation. Aortic Atherosclerosis (ICD10-I70.0). Electronically Signed   By: Dahlia Bailiff M.D.   On: 03/24/2022 15:27   DG Femur Min 2 Views Left  Result Date: 03/24/2022 CLINICAL DATA:  [place] out of bed yesterday.  Left leg pain. EXAM: LEFT FEMUR 2 VIEWS COMPARISON:  None Available. FINDINGS: Oblique fracture of the distal femur, extending from the midshaft to the distal femur. Proximal component of the fracture is displaced medially by 4.4 cm. Fracture is mildly angulated posteriorly. Femoral component of the knee prosthesis is well aligned, stem lying within the fractured distal component. There is ossification along the quadriceps tendon. Advanced arthropathic changes of the hip joint as detailed under the pelvis radiographs. Knee joint prosthetic components are normally aligned. There is soft tissue edema the mid to distal thigh. IMPRESSION: 1. Displaced fracture of the left femur extending from the midshaft to the distal femur. Electronically Signed   By: Lajean Manes M.D.   On: 03/24/2022 13:40   DG Pelvis 1-2 Views  Result Date: 03/24/2022 CLINICAL DATA:  [place] out of bed yesterday. Hip pain. Left knee pain. EXAM: PELVIS - 1-2 VIEW COMPARISON:  None Available. FINDINGS: Advanced arthropathic changes of the left hip, with marked  concentric joint space narrowing, subchondral sclerosis and prominent osteophytes. Femoral neck appears aligned with the more superior aspect of the femoral head, but there is no defined fracture line. There is surrounding heterotopic bone formation. All these findings are likely chronic. Mild superolateral right hip joint space narrowing. SI joints and pubic symphysis are normally spaced and aligned. No bone lesion. No definite acute fracture. IMPRESSION: 1. No convincing acute fracture, although a subtle fracture of the proximal left femur is difficult to exclude due to the extensive arthropathic changes and heterotopic bone formation. If there is  significant clinical suspicion for a proximal left femur fracture, recommend follow-up left hip MRI versus high-resolution unenhanced CT. Electronically Signed   By: Lajean Manes M.D.   On: 03/24/2022 12:55    Scheduled Meds:  aspirin  81 mg Oral QPM   carvedilol  3.125 mg Oral BID WC   docusate sodium  100 mg Oral BID   [START ON 03/26/2022] enoxaparin (LOVENOX) injection  40 mg Subcutaneous Q24H   fentaNYL (SUBLIMAZE) injection  50 mcg Intravenous Once   furosemide  40 mg Intravenous BID   insulin aspart  0-20 Units Subcutaneous TID WC   senna-docusate  1 tablet Oral BID   simvastatin  40 mg Oral Daily   Continuous Infusions:   ceFAZolin (ANCEF) IV      LOS: 1 day   Raiford Noble, DO [facility] Available via Epic secure chat 7am-7pm After these hours, please refer to coverage provider listed on [email or web address] 03/25/2022, 5:23 PM

## 2022-03-25 NOTE — Transfer of Care (Signed)
Immediate Anesthesia Transfer of Care Note  Patient: Toni [name]  Procedure(s) Performed: OPEN REDUCTION INTERNAL FIXATION LEFT FEMUR (Left: Leg Upper)  Patient Location: PACU  Anesthesia Type:General  Level of Consciousness: awake and alert   Airway & Oxygen Therapy: Patient Spontanous Breathing and Patient connected to face mask oxygen  Post-op Assessment: Report given to RN and Post -op Vital signs reviewed and stable  Post vital signs: Reviewed and stable  Last Vitals:  Vitals Value Taken Time  BP 125/59 03/25/22 1515  Temp    Pulse 82 03/25/22 1520  Resp 21 03/25/22 1520  SpO2 99 % 03/25/22 1520  Vitals shown include unvalidated device data.  Last Pain:  Vitals:   03/25/22 0940  TempSrc: Oral  PainSc:          Complications:  Encounter Notable Events  Notable Event Outcome Phase Comment  Difficult to intubate - expected  Intraprocedure Filed from anesthesia note documentation.

## 2022-03-25 NOTE — ED Notes (Signed)
Daughter updated on patient's plan of care. Patient used phone to talk to daughter

## 2022-03-26 ENCOUNTER — Inpatient Hospital Stay (HOSPITAL_COMMUNITY): Payer: Medicare Other

## 2022-03-26 ENCOUNTER — Encounter (HOSPITAL_COMMUNITY): Payer: Self-pay | Admitting: Orthopedic Surgery

## 2022-03-26 DIAGNOSIS — I5033 Acute on chronic diastolic (congestive) heart failure: Secondary | ICD-10-CM | POA: Diagnosis not present

## 2022-03-26 DIAGNOSIS — S7292XA Unspecified fracture of left femur, initial encounter for closed fracture: Secondary | ICD-10-CM | POA: Diagnosis not present

## 2022-03-26 DIAGNOSIS — M84352A Stress fracture, left femur, initial encounter for fracture: Secondary | ICD-10-CM | POA: Diagnosis not present

## 2022-03-26 LAB — ECHOCARDIOGRAM COMPLETE
AR max vel: 2.31 cm2
AV Area VTI: 2.42 cm2
AV Area mean vel: 2.26 cm2
AV Mean grad: 5 mmHg
AV Peak grad: 10.1 mmHg
Ao pk vel: 1.59 m/s
Area-P 1/2: 3.37 cm2
Height: 62 in
MV VTI: 1.63 cm2
P 1/2 time: 450 msec
S' Lateral: 3.3 cm
Weight: 4077.63 oz

## 2022-03-26 LAB — CBC
HCT: 29.1 % — ABNORMAL LOW (ref 36.0–46.0)
Hemoglobin: 9.4 g/dL — ABNORMAL LOW (ref 12.0–15.0)
MCH: 29.1 pg (ref 26.0–34.0)
MCHC: 32.3 g/dL (ref 30.0–36.0)
MCV: 90.1 fL (ref 80.0–100.0)
Platelets: 153 10*3/uL (ref 150–400)
RBC: 3.23 MIL/uL — ABNORMAL LOW (ref 3.87–5.11)
RDW: 12 % (ref 11.5–15.5)
WBC: 9.9 10*3/uL (ref 4.0–10.5)
nRBC: 0 % (ref 0.0–0.2)

## 2022-03-26 LAB — BASIC METABOLIC PANEL
Anion gap: 11 (ref 5–15)
BUN: 18 mg/dL (ref 8–23)
CO2: 26 mmol/L (ref 22–32)
Calcium: 8.3 mg/dL — ABNORMAL LOW (ref 8.9–10.3)
Chloride: 100 mmol/L (ref 98–111)
Creatinine, Ser: 0.78 mg/dL (ref 0.44–1.00)
GFR, Estimated: >60 mL/min (ref >60)
Glucose, Bld: 238 mg/dL — ABNORMAL HIGH (ref 70–99)
Potassium: 4.2 mmol/L (ref 3.5–5.1)
Sodium: 137 mmol/L (ref 135–145)

## 2022-03-26 LAB — URINALYSIS, ROUTINE W REFLEX MICROSCOPIC
Bilirubin Urine: NEGATIVE
Glucose, UA: NEGATIVE mg/dL
Ketones, ur: NEGATIVE mg/dL
Nitrite: NEGATIVE
Protein, ur: NEGATIVE mg/dL
RBC / HPF: >50 RBC/hpf — ABNORMAL HIGH (ref 0–5)
Specific Gravity, Urine: 1.019 (ref 1.005–1.030)
pH: 5 (ref 5.0–8.0)

## 2022-03-26 LAB — GLUCOSE, CAPILLARY
Glucose-Capillary: 241 mg/dL — ABNORMAL HIGH (ref 70–99)
Glucose-Capillary: 215 mg/dL — ABNORMAL HIGH (ref 70–99)
Glucose-Capillary: 194 mg/dL — ABNORMAL HIGH (ref 70–99)
Glucose-Capillary: 233 mg/dL — ABNORMAL HIGH (ref 70–99)

## 2022-03-26 LAB — VITAMIN D 25 HYDROXY (VIT D DEFICIENCY, FRACTURES): Vit D, 25-Hydroxy: 15.39 ng/mL — ABNORMAL LOW (ref 30–100)

## 2022-03-26 MED ORDER — INSULIN ASPART 100 UNIT/ML IJ SOLN
0.0000 [IU] | Freq: Three times a day (TID) | INTRAMUSCULAR | Status: DC
  Administered 2022-03-27 (×2): 3 [IU] via SUBCUTANEOUS
  Administered 2022-03-27: 1 [IU] via SUBCUTANEOUS
  Administered 2022-03-27: 2 [IU] via SUBCUTANEOUS
  Administered 2022-03-28: 7 [IU] via SUBCUTANEOUS
  Administered 2022-03-28: 1 [IU] via SUBCUTANEOUS
  Administered 2022-03-28: 2 [IU] via SUBCUTANEOUS
  Administered 2022-03-28: 3 [IU] via SUBCUTANEOUS
  Administered 2022-03-29: 1 [IU] via SUBCUTANEOUS
  Administered 2022-03-29: 2 [IU] via SUBCUTANEOUS

## 2022-03-26 MED ORDER — INSULIN ASPART 100 UNIT/ML IJ SOLN: 0.0000 [IU] | Freq: Three times a day (TID) | INTRAMUSCULAR | Status: DC

## 2022-03-26 MED ORDER — ENOXAPARIN SODIUM 60 MG/0.6ML IJ SOSY
55.0000 mg | PREFILLED_SYRINGE | INTRAMUSCULAR | Status: DC
  Administered 2022-03-27 – 2022-03-28 (×2): 55 mg via SUBCUTANEOUS
  Filled 2022-03-26 (×2): qty 0.6

## 2022-03-26 MED ORDER — VITAMIN D (ERGOCALCIFEROL) 1.25 MG (50000 UNIT) PO CAPS
50000.0000 [IU] | ORAL_CAPSULE | ORAL | Status: DC
  Administered 2022-03-26 – 2022-03-28 (×2): 50000 [IU] via ORAL
  Filled 2022-03-26 (×2): qty 1

## 2022-03-26 MED ORDER — ADULT MULTIVITAMIN W/MINERALS CH
1.0000 | ORAL_TABLET | Freq: Every day | ORAL | Status: DC
  Administered 2022-03-26 – 2022-03-29 (×4): 1 via ORAL
  Filled 2022-03-26 (×4): qty 1

## 2022-03-26 MED ORDER — VITAMIN D 25 MCG (1000 UNIT) PO TABS
2000.0000 [IU] | ORAL_TABLET | Freq: Two times a day (BID) | ORAL | Status: DC
  Administered 2022-03-26 – 2022-03-29 (×7): 2000 [IU] via ORAL
  Filled 2022-03-26 (×6): qty 2

## 2022-03-26 MED ORDER — ENSURE ENLIVE PO LIQD
237.0000 mL | Freq: Two times a day (BID) | ORAL | Status: DC
  Administered 2022-03-27 – 2022-03-29 (×5): 237 mL via ORAL

## 2022-03-26 MED ORDER — INSULIN ASPART 100 UNIT/ML IJ SOLN
3.0000 [IU] | Freq: Once | INTRAMUSCULAR | Status: AC
  Administered 2022-03-26: 3 [IU] via SUBCUTANEOUS

## 2022-03-26 NOTE — Anesthesia Postprocedure Evaluation (Signed)
Anesthesia Post Note  Patient: Toni [name]  Procedure(s) Performed: OPEN REDUCTION INTERNAL FIXATION LEFT FEMUR (Left: Leg Upper)     Patient location during evaluation: PACU Anesthesia Type: Regional and General Level of consciousness: awake and alert, oriented and patient cooperative Pain management: pain level controlled Vital Signs Assessment: post-procedure vital signs reviewed and stable Respiratory status: spontaneous breathing, nonlabored ventilation and respiratory function stable Cardiovascular status: blood pressure returned to baseline and stable Postop Assessment: no apparent nausea or vomiting Anesthetic complications: yes   Encounter Notable Events  Notable Event Outcome Phase Comment  Difficult to intubate - expected  Intraprocedure Filed from anesthesia note documentation.    Last Vitals:  Vitals:   03/25/22 1643 03/25/22 2157  BP: (!) 139/108 113/62  Pulse: 99 93  Resp:  16  Temp: 36.7 C 37.1 C  SpO2: 100% 99%    Last Pain:  Vitals:   03/25/22 2252  TempSrc:   PainSc: [place]

## 2022-03-26 NOTE — Progress Notes (Signed)
Inpatient Diabetes Program Recommendations  AACE/ADA: New Consensus Statement on Inpatient Glycemic Control (2015)  Target Ranges:  Prepandial:   less than 140 mg/dL      Peak postprandial:   less than 180 mg/dL (1-2 hours)      Critically ill patients:  140 - 180 mg/dL   Lab Results  Component Value Date   GLUCAP 215 (H) 03/26/2022   HGBA1C 7.8 (H) 03/24/2022    Review of Glycemic Control  Latest Reference Range & Units 03/25/22 17:48 03/25/22 21:01 03/26/22 08:13 03/26/22 12:37  Glucose-Capillary 70 - 99 mg/dL 157 (H) 219 (H) 241 (H) 215 (H)   Diabetes history: DM 2 Outpatient Diabetes medications:  None listed Current orders for Inpatient glycemic control: Novolog resistant tid with meals  Inpatient Diabetes Program Recommendations:    Please consider reducing Novolog correction to sensitive tid with meals- Anticipate that blood sugars will improve since she has not received steroids today (got 15 mg of Decadron on 03/25/22).   Thanks,  Adah Perl, RN, BC-ADM Inpatient Diabetes Coordinator Pager [phone]  (8a-5p)

## 2022-03-26 NOTE — Progress Notes (Signed)
Mobility Specialist Progress Note   03/26/22 1210  Mobility  Activity Transferred from chair to bed  Level of Assistance Other (Comment) (+3A(safety/equipment))  LLE Weight Bearing NWB  Activity Response Tolerated poorly  $Mobility charge 1 Mobility   RN requesting assistance to get pt from chair to bed d/t pt soiling themselves. Required +3A d/t soil and equipment to safely get pt over to bed w/o fault. Pt left supine in bed w/ RN's and NT tending to pericare.  Holland Falling Mobility Specialist MS [facility] #:  [phone] Acute Rehab Office:  [phone]

## 2022-03-26 NOTE — Evaluation (Signed)
Physical Therapy Evaluation Patient Details Name: Toni [name] MRN: [id number] DOB: 12-03-1932 Today's Date: 03/26/2022  History of Present Illness  Patient is 88 y.o. female admitted after ground level fall with Lt periprosthetic distal femur fracture now s/p ORIF and NWB on Lt LE. PMH significant for DM, HTN, PVD, obesity. Pt limited ambulator at baseline.    Clinical Impression  Toni [name] is a 88 y.o. female POD 1 s/p Lt LE ORIF. PTA pt was very limited with mobility and had progressed to stand pivot transfers bed<>BSC primarily. Patient is now limited by functional impairments (see PT problem list below) and requires lift for safe transfers OOB due to weakness, pain, and NWB status of Lt LE. Pt was able to pivot to EOB with Max +2 assist and stand partially with 2+ max assist for safety. Pt had good initiation with Rt LE and UE's to power up from EOB but is unable to maintain NWB on Lt LE. EOS pt's incision bleeding and RN called to room to assist with dressing change/reinforcement. Patient will benefit from continued skilled PT interventions to address impairments and progress towards PLOF. Recommend SNF rehab.    Recommendations for follow up therapy are one component of a multi-disciplinary discharge planning process, led by the attending physician.  Recommendations may be updated based on patient status, additional functional criteria and insurance authorization.  Follow Up Recommendations Skilled nursing-short term rehab (<3 hours/day) Can patient physically be transported by private vehicle: No    Assistance Recommended at Discharge Frequent or constant Supervision/Assistance  Patient can return home with the following  Two people to help with walking and/or transfers;Two people to help with bathing/dressing/bathroom;Assistance with cooking/housework;Assistance with feeding;Direct supervision/assist for medications management;Assist for transportation;Help with stairs or ramp for  entrance    Equipment Recommendations  (TBD at next venue)  Recommendations for Other Services       Functional Status Assessment Patient has had a recent decline in their functional status and/or demonstrates limited ability to make significant improvements in function in a reasonable and predictable amount of time     Precautions / Restrictions Precautions Precautions: Fall Restrictions Weight Bearing Restrictions: Yes LLE Weight Bearing: Non weight bearing      Mobility  Bed Mobility Overal bed mobility: Needs Assistance Bed Mobility: Supine to Sit, Sit to Supine     Supine to sit: +2 for physical assistance, +2 for safety/equipment, HOB elevated     General bed mobility comments: Pt ultimately requires Max+2 assist for safety and physical assist but does attempt to initiated moving bil LE's towards EOB and reachign for bed rail despite pain and limited shoulder ROM.    Transfers Overall transfer level: Needs assistance   Transfers: Bed to chair/wheelchair/BSC, Sit to/from Stand Sit to Stand: Max assist, +2 physical assistance, +2 safety/equipment           General transfer comment: 2x stand from EOB, pt unable to maintain NWB on Lt LE with max+2 for safety. Pt wtih good initiation to stand from EOB. Lift pad placed under pt while sitting EOB after first stand and pt transfered to recliner via [place]. RN notified of pt's incision draining blood and RN/PT worked to complete dressing change/reinforcement. Transfer via Lift Equipment: [employer]  Ambulation/Gait                  Stairs            Wheelchair Mobility    Modified Rankin (Stroke Patients Only)  Balance Overall balance assessment: Needs assistance Sitting-balance support: Feet supported, Bilateral upper extremity supported Sitting balance-Leahy Scale: Poor Sitting balance - Comments: reliant on UE support     Standing balance-Leahy Scale: Zero Standing balance comment: unable  to stand with NWB on Lt LE continues to require 2+ for safety                             Pertinent Vitals/Pain      Home Living Family/patient expects to be discharged to:: Private residence Living Arrangements: Children (Daughter) Available Help at Discharge: Family Type of Home: House Home Access: Stairs to enter   Technical brewer of Steps: 1   Home Layout: One level Home Equipment: Rollator (4 wheels);Rolling Walker (2 wheels)      Prior Function Prior Level of Function : Needs assist                     Hand Dominance   Dominant Hand: Right    Extremity/Trunk Assessment   Upper Extremity Assessment Upper Extremity Assessment: Defer to OT evaluation    Lower Extremity Assessment Lower Extremity Assessment: RLE deficits/detail;LLE deficits/detail RLE Deficits / Details: limited testing by habitus, pt able to complete partial SLR, quad set, dorsi/plantart flexion, glute set, and able to initaite stand with Rt LE. LLE Deficits / Details: pt NWB, no testing done, pt can complete quad set. LLE: Unable to fully assess due to pain    Cervical / Trunk Assessment Cervical / Trunk Assessment: Kyphotic  Communication   Communication: No difficulties  Cognition Arousal/Alertness: Awake/alert Behavior During Therapy: WFL for tasks assessed/performed Overall Cognitive Status: Within Functional Limits for tasks assessed                                          General Comments      Exercises     Assessment/Plan    PT Assessment Patient needs continued PT services  PT Problem List Decreased strength;Decreased range of motion;Decreased activity tolerance;Decreased balance;Decreased mobility;Decreased knowledge of use of DME;Decreased safety awareness;Decreased knowledge of precautions;Cardiopulmonary status limiting activity;Obesity;Pain       PT Treatment Interventions DME instruction;Gait training;Stair training;Functional  mobility training;Therapeutic activities;Therapeutic exercise;Balance training;Neuromuscular re-education;Patient/family education    PT Goals (Current goals can be found in the Care Plan section)  Acute Rehab PT Goals Patient Stated Goal: to heal and get better PT Goal Formulation: With patient Time For Goal Achievement: 04/09/22 Potential to Achieve Goals: Fair    Frequency Min 3X/week     Co-evaluation PT/OT/SLP Co-Evaluation/Treatment: Yes Reason for Co-Treatment: For patient/therapist safety;To address functional/ADL transfers PT goals addressed during session: Mobility/safety with mobility;Balance OT goals addressed during session: ADL's and self-care;Strengthening/ROM       AM-PAC PT "6 Clicks" Mobility  Outcome Measure Help needed turning from your back to your side while in a flat bed without using bedrails?: Total Help needed moving from lying on your back to sitting on the side of a flat bed without using bedrails?: Total Help needed moving to and from a bed to a chair (including a wheelchair)?: Total Help needed standing up from a chair using your arms (e.g., wheelchair or bedside chair)?: Total Help needed to walk in hospital room?: Total Help needed climbing 3-5 steps with a railing? : Total 6 Click Score: 6    End of Session  Equipment Utilized During Treatment: Gait belt Activity Tolerance: Patient tolerated treatment well Patient left: in chair;with call bell/phone within reach;with chair alarm set;with nursing/sitter in room Nurse Communication: Mobility status;Need for lift equipment;Weight bearing status PT Visit Diagnosis: Other abnormalities of gait and mobility (R26.89);Muscle weakness (generalized) (M62.81);Difficulty in walking, not elsewhere classified (R26.2);Other symptoms and signs involving the nervous system (R29.898);Pain;History of falling (Z91.81) Pain - Right/Left: Left Pain - part of body: Hip;Knee    Time: 1117-1204 PT Time Calculation  (min) (ACUTE ONLY): 47 min   Charges:   PT Evaluation $PT Eval Moderate Complexity: 1 Mod PT Treatments $Therapeutic Activity: 8-22 mins        Verner Mould, DPT Acute Rehabilitation Services Office [phone] Pager [phone]  03/26/22 3:28 PM

## 2022-03-26 NOTE — Progress Notes (Signed)
Initial Nutrition Assessment  DOCUMENTATION CODES:   Morbid obesity  INTERVENTION:  Encourage adequate PO intake Ensure Enlive po BID, each supplement provides 350 kcal and 20 grams of protein. (Pt prefers strawberry) MVI with minerals daily  NUTRITION DIAGNOSIS:   Increased nutrient needs related to post-op healing as evidenced by estimated needs.  GOAL:   Patient will meet greater than or equal to 90% of their needs  MONITOR:   PO intake, Supplement acceptance, Labs, Weight trends, I & O's, Skin  REASON FOR ASSESSMENT:   Consult Hip fracture protocol  ASSESSMENT:   Pt admitted after a fall leading to L femur fx. PMH significant for HTN, HLD, diet controlled T2DM, morbid obesity, chronic HFpEF, severe pulmonary HTN, knee OA s/p L TKA, PVD, chronic ambulation dysfunction.   9/7 s/p ORIF of L femur fx  PT/OT recommend SNF.   Pt with emesis bag in hand at time of visit. Denies current nausea but states that she was throwing up last night. Today she states that she has not eaten anything, although RN reports that she had breakfast this morning but threw it all back up. She states that she had reported nausea anytime she was moved such as when working with PT. Pt had saltines on her table but was very adamant about not eating them. Since she had not eaten since this morning but appears she has not thrown up since, offered to order a meal tray for her to include items such as soup and jello. She was hesitant but agreed however wanted to talk to her nurse first. Made RN aware who spoke with pt and ordered her meal for her. Unable to obtain detailed nutrition related history prior to admission. She did report that she enjoys Boost and Ensure and is agreeable to receive these during admission.   Meal completions: 9/8: 25% lunch  Limited documentation of weight history within the last year. Current weight noted to be 115.6 kg. Will continue to monitor throughout admission.   Edema:  mild pitting BLE  Medications: Vitamin D3, colace, lasix, SSI 0-9 units TID, senna  Labs: Vitamin D 15.39, HgbA1c 7.8%, CBG's 194-241 x24 hours  UOP: 1.8L x24 hours + 274m x12 hours I/O's: +1831msince admission   NUTRITION - FOCUSED PHYSICAL EXAM:  Flowsheet Row Most Recent Value  Orbital Region No depletion  Upper Arm Region No depletion  Thoracic and Lumbar Region No depletion  Buccal Region No depletion  Temple Region No depletion  Clavicle Bone Region No depletion  Clavicle and Acromion Bone Region No depletion  Scapular Bone Region No depletion  Dorsal Hand No depletion  Patellar Region No depletion  Anterior Thigh Region No depletion  Posterior Calf Region No depletion  Edema (RD Assessment) Mild  Hair Reviewed  Eyes Reviewed  Mouth Reviewed  Skin Reviewed  Nails Reviewed       Diet Order:   Diet Order             Diet Carb Modified Fluid consistency: Thin; Room service appropriate? Yes  Diet effective now                   EDUCATION NEEDS:   Education needs have been addressed  Skin:  Skin Assessment: Skin Integrity Issues: Skin Integrity Issues:: Incisions Incisions: L leg (closed)  Last BM:  9/8  Height:   Ht Readings from Last 1 Encounters:  03/24/22 '5\' 2"'$  (1.575 m)    Weight:   Wt Readings from Last 1 Encounters:  03/25/22 115.6 kg    Ideal Body Weight:  50 kg  BMI:  Body mass index is 46.61 kg/m.  Estimated Nutritional Needs:   Kcal:  1300-1500  Protein:  65-80g  Fluid:  >/=1.5L  Clayborne Dana, RDN, LDN Clinical Nutrition

## 2022-03-26 NOTE — Evaluation (Signed)
Occupational Therapy Evaluation Patient Details Name: Toni [name] MRN: [id number] DOB: Oct 21, 1932 Today's Date: 03/26/2022   History of Present Illness 88 y.o. female admitted after ground level fall with Lt periprosthetic distal femur fracture now s/p ORIF and NWB on Lt LE. PMH significant for DM, HTN, PVD, obesity. Pt limited ambulator at baseline.   Clinical Impression   PTA, pt was living with her daughter who assisted with ADLs; pt using rollator for stand pivot transfers at home. Pt currently requiring Min-Mod A for, Max A for LB ADLs, and Max A +2 for sit<>stand. Pt unable to maintain NWB status of LLE during transfer. Requiring lift to transfer from bed to recliner. Despite pain, pt very motivated to participate in therapy. Pt would benefit from further acute OT to facilitate safe dc. Recommend dc to SNF for further OT to optimize safety, independence with ADLs, and return to PLOF.      Recommendations for follow up therapy are one component of a multi-disciplinary discharge planning process, led by the attending physician.  Recommendations may be updated based on patient status, additional functional criteria and insurance authorization.   Follow Up Recommendations  Skilled nursing-short term rehab (<3 hours/day)    Assistance Recommended at Discharge Frequent or constant Supervision/Assistance  Patient can return home with the following  (Total A)    Functional Status Assessment     Equipment Recommendations  None recommended by OT    Recommendations for Other Services       Precautions / Restrictions Precautions Precautions: Fall Restrictions Weight Bearing Restrictions: Yes LLE Weight Bearing: Non weight bearing      Mobility Bed Mobility Overal bed mobility: Needs Assistance Bed Mobility: Supine to Sit, Sit to Supine     Supine to sit: +2 for physical assistance, +2 for safety/equipment, HOB elevated, Max assist     General bed mobility comments: Pt  ultimately requires Max+2 assist for safety and physical assist but does attempt to initiated moving bil LE's towards EOB and reachign for bed rail despite pain and limited shoulder ROM.    Transfers Overall transfer level: Needs assistance   Transfers: Bed to chair/wheelchair/BSC, Sit to/from Stand Sit to Stand: Max assist, +2 physical assistance, +2 safety/equipment           General transfer comment: 2x stand from EOB, pt unable to maintain NWB on Lt LE with max+2 for safety. Pt wtih good initiation to stand from EOB. Lift pad placed under pt while sitting EOB after first stand and pt transfered to recliner via [place]. RN notified of pt's incision draining blood and RN/PT worked to complete dressing change/reinforcement. Transfer via [place] Overall balance assessment: Needs assistance Sitting-balance support: Feet supported, Bilateral upper extremity supported Sitting balance-Leahy Scale: Poor Sitting balance - Comments: reliant on UE support     Standing balance-Leahy Scale: Zero Standing balance comment: unable to stand with NWB on Lt LE continues to require 2+ for safety                           ADL either performed or assessed with clinical judgement   ADL Overall ADL's : Needs assistance/impaired Eating/Feeding: Set up;Sitting   Grooming: Wash/dry face;Supervision/safety;Set up;Sitting   Upper Body Bathing: Minimal assistance;Sitting   Lower Body Bathing: Maximal assistance;Sit to/from stand   Upper Body Dressing : Moderate assistance;Sitting   Lower Body Dressing: Maximal assistance;+2 for physical assistance;Sit to/from stand   Toilet Transfer:  Total assistance (use of lift to transfer to recliner)           Functional mobility during ADLs: Maximal assistance;+2 for physical assistance (sit<>stand from EOB with Max A +2 but unable to maintain NWB status; lift for transfer to recliner) General ADL Comments: Pt very  motivated. unabel to maintain NWB status.     Vision Baseline Vision/History: 1 Wears glasses       Perception     Praxis      Pertinent Vitals/Pain       Hand Dominance Right   Extremity/Trunk Assessment Upper Extremity Assessment Upper Extremity Assessment: RUE deficits/detail;LUE deficits/detail RUE Deficits / Details: Difficulty reaching RUE overhead LUE Deficits / Details: Difficulty reaching RUE overhead   Lower Extremity Assessment Lower Extremity Assessment: Defer to PT evaluation RLE Deficits / Details: limited testing by habitus, pt able to complete partial SLR, quad set, dorsi/plantart flexion, glute set, and able to initaite stand with Rt LE. LLE Deficits / Details: pt NWB, no testing done, pt can complete quad set. LLE: Unable to fully assess due to pain   Cervical / Trunk Assessment Cervical / Trunk Assessment: Kyphotic   Communication Communication Communication: No difficulties   Cognition Arousal/Alertness: Awake/alert Behavior During Therapy: WFL for tasks assessed/performed Overall Cognitive Status: Within Functional Limits for tasks assessed                                       General Comments  Bleeding from sx site; notified RN who changed bandage    Exercises     Shoulder Instructions      Home Living Family/patient expects to be discharged to:: Private residence Living Arrangements: Children (Daughter) Available Help at Discharge: Family Type of Home: House Home Access: Stairs to enter Technical brewer of Steps: 1   Home Layout: One level     Bathroom Shower/Tub: Sponge bathes at baseline (at EOB)         Home Equipment: Rollator (4 wheels);Rolling Walker (2 wheels)          Prior Functioning/Environment Prior Level of Function : Needs assist             Mobility Comments: Using rollator to stand pivot to [facility] from bed ADLs Comments: Daughter assisting with ADLs. Was sponge bathing at EOB with  daughter's help.        OT Problem List: Decreased strength;Decreased range of motion;Decreased activity tolerance;Impaired balance (sitting and/or standing);Decreased knowledge of use of DME or AE;Decreased knowledge of precautions;Pain      OT Treatment/Interventions: Self-care/ADL training;Therapeutic exercise;Energy conservation;DME and/or AE instruction;Therapeutic activities;Patient/family education    OT Goals(Current goals can be found in the care plan section) Acute Rehab OT Goals Patient Stated Goal: Get stronger OT Goal Formulation: With patient Time For Goal Achievement: 04/09/22 Potential to Achieve Goals: Good  OT Frequency: Min 2X/week    Co-evaluation PT/OT/SLP Co-Evaluation/Treatment: Yes Reason for Co-Treatment: To address functional/ADL transfers;For patient/therapist safety   OT goals addressed during session: ADL's and self-care      AM-PAC OT "6 Clicks" Daily Activity     Outcome Measure Help from another person eating meals?: A Little Help from another person taking care of personal grooming?: A Little Help from another person toileting, which includes using toliet, bedpan, or urinal?: A Lot Help from another person bathing (including washing, rinsing, drying)?: A Lot Help from another person to put on and taking  off regular upper body clothing?: A Lot Help from another person to put on and taking off regular lower body clothing?: A Lot 6 Click Score: 14   End of Session Equipment Utilized During Treatment: Gait belt Nurse Communication: Mobility status;Need for lift equipment  Activity Tolerance: Patient tolerated treatment well Patient left: in chair;with call bell/phone within reach;with chair alarm set  OT Visit Diagnosis: Other abnormalities of gait and mobility (R26.89);Unsteadiness on feet (R26.81);Muscle weakness (generalized) (M62.81);Pain Pain - Right/Left: Left Pain - part of body: Leg                Time: 1117-1204 OT Time Calculation  (min): 47 min Charges:  OT General Charges $OT Visit: 1 Visit OT Evaluation $OT Eval Moderate Complexity: 1 Mod  Kentrell Guettler MSOT, OTR/L Acute Rehab Office: [place] 03/26/2022, 5:09 PM

## 2022-03-26 NOTE — Progress Notes (Signed)
Orthopaedic Trauma Service Progress Note  Patient ID: Toni [name] MRN: [id number] DOB/AGE: 86-Aug-1934 86 y.o.  Subjective:  In bed, getting bath Doing ok   States she really does walk much anymore.  Only does bed to chair transfers and bed to potty chair transfers   ROS As above Objective:   VITALS:   Vitals:   03/25/22 1615 03/25/22 1643 03/25/22 2157 03/26/22 0810  BP: 122/62 (!) 139/108 113/62 (!) 109/59  Pulse: 97 99 93 82  Resp: '20  16 18  '$ Temp:  98.1 F (36.7 C) 98.7 F (37.1 C) 97.6 F (36.4 C)  TempSrc:   Oral Oral  SpO2: 100% 100% 99% 95%  Weight:      Height:        Estimated body mass index is 46.61 kg/m as calculated from the following:   Height as of this encounter: '5\' 2"'$  (1.575 m).   Weight as of this encounter: 115.6 kg.   Intake/Output      09/07 0701 09/08 0700 09/08 0701 09/09 0700   P.O. 120    I.V. (mL/kg) 1300 (11.2)    IV Piggyback 550    Total Intake(mL/kg) 1970 (17)    Urine (mL/kg/hr) 1800 (0.6)    Blood 100    Total Output 1900    Net +70           LABS  Results for orders placed or performed during the hospital encounter of 03/24/22 (from the past 24 hour(s))  Glucose, capillary     Status: Abnormal   Collection Time: 03/25/22  3:15 PM  Result Value Ref Range   Glucose-Capillary 131 (H) 70 - 99 mg/dL  Glucose, capillary     Status: Abnormal   Collection Time: 03/25/22  5:48 PM  Result Value Ref Range   Glucose-Capillary 157 (H) 70 - 99 mg/dL  Glucose, capillary     Status: Abnormal   Collection Time: 03/25/22  9:01 PM  Result Value Ref Range   Glucose-Capillary 219 (H) 70 - 99 mg/dL  CBC     Status: Abnormal   Collection Time: 03/26/22  1:03 AM  Result Value Ref Range   WBC 9.9 4.0 - 10.5 K/uL   RBC 3.23 (L) 3.87 - 5.11 MIL/uL   Hemoglobin 9.4 (L) 12.0 - 15.0 g/dL   HCT 29.1 (L) 36.0 - 46.0 %   MCV 90.1 80.0 - 100.0 fL   MCH 29.1 26.0 -  34.0 pg   MCHC 32.3 30.0 - 36.0 g/dL   RDW 12.0 11.5 - 15.5 %   Platelets 153 150 - 400 K/uL   nRBC 0.0 0.0 - 0.2 %  Basic metabolic panel     Status: Abnormal   Collection Time: 03/26/22  1:03 AM  Result Value Ref Range   Sodium 137 135 - 145 mmol/L   Potassium 4.2 3.5 - 5.1 mmol/L   Chloride 100 98 - 111 mmol/L   CO2 26 22 - 32 mmol/L   Glucose, Bld 238 (H) 70 - 99 mg/dL   BUN 18 8 - 23 mg/dL   Creatinine, Ser 0.78 0.44 - 1.00 mg/dL   Calcium 8.3 (L) 8.9 - 10.3 mg/dL   GFR, Estimated >60 >60 mL/min   Anion gap 11 5 - 15  VITAMIN D 25 Hydroxy (Vit-D Deficiency, Fractures)  Status: Abnormal   Collection Time: 03/26/22  1:03 AM  Result Value Ref Range   Vit D, 25-Hydroxy 15.39 (L) 30 - 100 ng/mL  Glucose, capillary     Status: Abnormal   Collection Time: 03/26/22  8:13 AM  Result Value Ref Range   Glucose-Capillary 241 (H) 70 - 99 mg/dL     PHYSICAL EXAM:   Gen: in bed, NAD, pleasant  Lungs: unlabored Cardiac: reg Ext:       Left Lower Extremity   Dressing in place but tattered  Drainage on dressing  Chronic LEx edema  Motor and sensory functions grossly intact  + DP pulse  Ext warm     Assessment/Plan: 1 Day Post-Op   Principal Problem:   Femur fracture (HCC) Active Problems:   Stress fracture, left femur, initial encounter for fracture   Anti-infectives (From admission, onward)    Start     Dose/Rate Route Frequency Ordered Stop   03/25/22 1800  ceFAZolin (ANCEF) IVPB 2g/100 mL premix        2 g 200 mL/hr over 30 Minutes Intravenous Every 6 hours 03/25/22 1705 03/26/22 0611   03/25/22 1424  vancomycin (VANCOCIN) powder  Status:  Discontinued          As needed 03/25/22 1424 03/25/22 1507   03/25/22 1000  ceFAZolin (ANCEF) IVPB 2g/100 mL premix        2 g 200 mL/hr over 30 Minutes Intravenous To [facility] 03/25/22 0704 03/25/22 1200   03/24/22 1200  cefTRIAXone (ROCEPHIN) 1 g in sodium chloride 0.9 % 100 mL IVPB  Status:  Discontinued         1 g 200 mL/hr over 30 Minutes Intravenous  Once 03/24/22 1149 03/24/22 1220   03/24/22 1200  azithromycin (ZITHROMAX) 500 mg in sodium chloride 0.9 % 250 mL IVPB  Status:  Discontinued        500 mg 250 mL/hr over 60 Minutes Intravenous  Once 03/24/22 1149 03/24/22 1220     .  POD/HD#: 1  88 y/o female s/p ground level fall with left periprosthetic distal femur fracture    -fall   - fragility fracture left distal femur, periprosthetic distal femur fracture. Nonambulator  NWB Left leg   Slide or lift transfers  Unrestricted ROM L knee   Pts L hip is nearly ankylosed and has minimal motion    I suspect she spends most of her time in bed    PT/OT   TOC consult for SNF    Reinforce dressing as needed, change starting tomorrow   - Pain management:  Multimodal   - ABL anemia/Hemodynamics  Stable  Monitor  - Medical issues   Per primary     Blood sugars elevated   ?neurogenic bladder/urinary retention      - DVT/PE prophylaxis:  Weightbased lovenox   Eliquis or xarelto at [employer]  - ID:   Periop abx   - Metabolic Bone Disease:  Vitamin d deficiency    Supplement  Fragility fracture  Referral to osteoporosis clinic   Non-ambulator status makes for weak bone   - Activity:  As above  - FEN/GI prophylaxis/Foley/Lines:  Carb mod diet   Foley for perineal care   - Impediments to fracture healing:  Vitamin d deficiency   Diabetes    - Dispo:  Therapy evals  Continue with inpatient care  [facility] for SNF      Jari Pigg, PA-C [phone] (C) 03/26/2022, 10:25 AM  [facility]  [address] [place] [address] [phone] Domingo Sep (F)    After 5pm and on the weekends please log on to Amion, go to orthopaedics and the look under the [facility] Call for the provider(s) on call. You can also call our office at [phone] and then follow the prompts to be connected to the call team.   Patient ID: Toni [name], female   DOB: Nov 21, 1932, 88 y.o.   MRN: [id number]

## 2022-03-26 NOTE — Progress Notes (Signed)
Nurse tech gave patient bath and after rolling patient for new linen dressing on left leg became soiled with blood.  Dressing reinforced. Patient became nauseated and began to vomit.  PRN zofran given.  Will continue to monitor.

## 2022-03-26 NOTE — Progress Notes (Signed)
PT advised this nurse after transferring patient, incision site started bleeding and saturated dressing.  Ainsley Spinner notified.  Per Lanny Hurst, this nurse was told to place new mepliex on incision site.  Due to bleeding, site was covered with ABD pads and mepilex dressings to reinforce.

## 2022-03-26 NOTE — Progress Notes (Signed)
PROGRESS NOTE    Toni [name]  VHQ:[id number] DOB: 1933-04-17 DOA: 03/24/2022 PCP: [facility]   HPI per Dr. Wynetta Fines on 03/24/22 HPI: Toni [name] is a 88 y.o. female with medical history significant of HTN, HLD, IDDM on diet control, morbid obesity, chronic HFpEF, severe pulmonary hypertension, knee OA status post left TKA in 2003, PVD, chronic ambulation dysfunction, presented with mechanical fall and left leg fracture.   Patient has chronic ambulation dysfunction, uses a walker to ambulate, and nighttime use of bedside commode.  Last night, patient woke up to use the commode but she rolled and lost balance and fell on the floor on left leg.  Denies any chest pain lightheadedness of prodromes.   At baseline, patient uses roller walker to ambulate because of chronic knee problems for more than 8 years.  She reported frequent shortness of breath and chronic productive cough " a lot of phlegms" as well as chronic bilateral lower extremity swelling.  She used to be on Lasix but stopped taking "for sometime", she used to be on insulin for her diabetes but at least this year, she has been only on diet control for unclear reasons.   **Interim History  She underwent surgical intervention by Dr. Ginette Pitman and today she is postoperative day 1.  Echocardiogram was done and she continues to be on diuretics.  Orthopedic surgery recommending continuing the Foley catheter today and trying to remove tomorrow given that she does not really ambulate anymore and only does bed to chair transfers and bed to potty chair transfers.  PT OT recommending SNF.  The operation side started having some bleeding so is reinforced with Mepilex.  Assessment and Plan:  Acute left femur fracture status post ORIF of the left periprosthetic femur fracture with Biomet NCB plate and application stress under fluoroscopy to the left knee -Scheduled OR tomorrow, Dr. Roosevelt Locks discussed with orthopedic surgery PA, likely will be postponed  due to recent block. -Pain control per orthopedic surgery along with DVT prophylaxis -We will need PT OT to further evaluate and treat for safe discharge disposition they are recommending SNF -She was taken to the OR by Dr. Marcelino Scot for an open reduction internal fixation of the left periprosthetic femur fracture with Biomet NCB plate and application of stress under fluoroscopy to the left knee -Continue with bowel regimen with bisacodyl 5 mg p.o. daily as needed for moderate constipation or, docusate 100 mg p.o. twice daily, senna docusate 1 tab p.o. twice daily as well as as needed -Continue with pain control with morphine 4 mg IV every 4 as needed and hydromorphone 0.5 mg IV every 2 as needed for severe pain and has hydrocodone-acetaminophen 1 tab p.o. every 6 as needed for moderate pain -Cardiology evaluated preoperatively and they feel that they cannot lower her cardiac risk with any further testing or intervention and are recommending avoiding excessive IV fluids to prevent volume overload and continue monitoring if perioperative complications -MND level was 15.39 -Further care per orthopedic surgery and they are recommending either Xarelto or Eliquis at discharge for DVT prophylaxis as she is currently on enoxaparin 55 mg subcu q. 24   Acute on chronic HFpEF decompensation -Significant fluid overload, peripheral edema and bilateral crackles, will continue IV Lasix 40 mg IV twice daily.  -BNP was elevated at 309 -Echocardiogram ordered and showed G1DD with EF of 65-70% -Echocardiogram 2019 showed significant abnormalities with significant severe pulmonary hypertension, and mild to moderate mitral regurgitation.  On physical exam she does  have multiple murmurs and S3.  She has poor activity tolerance but significant exertional dyspnea with minimal activity at home.   -Cardiology consulted for further evaluation -Start beta-blocker -She will need strict I's and O's and daily weights; she is +180 mL  since admission -Continue to monitor for signs and symptoms of volume overload -Has a Foley catheter in place and will likely need to remove this tomorrow; for some reason the Ortho team has ordered a urinalysis and urine culture -DVT prophylaxis per orthopedic surgery now she is on enoxaparin 40 mg subcu every 24 and also on aspirin 81 mg p.o. daily -Continuing carvedilol 3.125 mg p.o. twice daily -Repeat chest x-ray in the a.m.   IDDM with Hyperglycemia -Not adherent with insulin regimen, reason no known -Start sliding scale and was on resistant scale but will cut down to the sensitive NovoLog sign scale -Continue monitor CBGs per protocol; CBGs ranging from 194-241   Pyuria and bacteriuria -Patient was having frequent urination but she is on Lasix; unclear why the Ortho team is worried this given that she is asymptomatic -Urinalysis showed a hazy appearance with amber-colored urine, moderate hemoglobin, moderate leukocytes, rare bacteria, greater than 50 RBCs per high-power field and 21-50 WBCs with 0-5 squamous epithelial cells with a urine culture pending -Currently do not have a clinical inclination to treat given that she has no symptoms  HTN -On Lasix as above -hold off verapamil, start Coreg -Continue to monitor blood pressures per protocol -Last blood pressure reading was elevated at 139/108   Pulmonary HTN -In decompensation with significant peripheral edema, IV Lasix given continue today -ECHO ordered and done and showed ". Left ventricular ejection fraction, by estimation, is 65 to 70%. The  left ventricle has normal function. The left ventricle has no regional wall motion abnormalities. There is mild concentric left ventricular hypertrophy. Left ventricular diastolic  parameters are consistent with Grade I diastolic dysfunction (impaired relaxation). Right ventricular systolic function is normal. The right ventricular size is normal. Left atrial size was mildly dilated. The  mitral valve is normal in structure. No evidence of mitral valve regurgitation. Mild mitral stenosis. Severe mitral annular calcification. The aortic valve is tricuspid. Aortic valve regurgitation is trivial. No aortic stenosis is present."   Prolonged Qtc -Appears to be a chronic issue, no significant QTc changes compared to EKG in 2018. -Continue Telemetry Monitoring    PVD -CAD equivalent -Considered to be mild on the right side of the leg with the ABI study in 2018 and normal ABI on left side in 2018. -Continue aspirin, outpatient follow-up with PCP.   Morbid Obesity -Complicates overall prognosis and care -Estimated body mass index is 46.61 kg/m as calculated from the following:   Height as of this encounter: '5\' 2"'$  (1.575 m).   Weight as of this encounter: 115.6 kg.  -Weight Loss and Dietary Counseling given   Normocytic Anemia -Patient's hemoglobin/hematocrit went from 12.0/38.2 is now 11.1/36.4 yesterday and as expected dropped postoperatively given her recent surgical intervention and today is 9.4/29.1 -Check anemia panel in the a.m. Continue to monitor for signs of bleeding; no overt bleeding noted -Repeat CBC in a.m.   Hypoalbuminemia -Patient's albumin level is now 2.9 on last check -Continue to monitor and trend and repeat CMP in a.m.    DVT prophylaxis: SCDs Start: 03/25/22 1705    Code Status: Full Code Family Communication: No family currently at bedside  Disposition Plan:  Level of care: Telemetry Medical Status is: Inpatient Remains inpatient appropriate  because: Needs further clinical improvement and clearance by PT and OT as well as orthopedic surgery.  They are recommending SNF and will need TOC to help with discharge disposition   Consultants:  Cardiology Orthopedic Surgery  Procedures:   ECHOCARDIOGRAM IMPRESSIONS    1. Left ventricular ejection fraction, by estimation, is 65 to 70%. The  left ventricle has normal function. The left ventricle  has no regional  wall motion abnormalities. There is mild concentric left ventricular  hypertrophy. Left ventricular diastolic  parameters are consistent with Grade I diastolic dysfunction (impaired  relaxation).   2. Right ventricular systolic function is normal. The right ventricular  size is normal.   3. Left atrial size was mildly dilated.   4. The mitral valve is normal in structure. No evidence of mitral valve  regurgitation. Mild mitral stenosis. Severe mitral annular calcification.   5. The aortic valve is tricuspid. Aortic valve regurgitation is trivial.  No aortic stenosis is present.   FINDINGS   Left Ventricle: Left ventricular ejection fraction, by estimation, is 65  to 70%. The left ventricle has normal function. The left ventricle has no  regional wall motion abnormalities. The left ventricular internal cavity  size was normal in size. There is   mild concentric left ventricular hypertrophy. Left ventricular diastolic  parameters are consistent with Grade I diastolic dysfunction (impaired  relaxation).   Right Ventricle: The right ventricular size is normal. Right ventricular  systolic function is normal.   Left Atrium: Left atrial size was mildly dilated.   Right Atrium: Right atrial size was normal in size.   Pericardium: There is no evidence of pericardial effusion.   Mitral Valve: The mitral valve is normal in structure. Severe mitral  annular calcification. No evidence of mitral valve regurgitation. Mild  mitral valve stenosis. MV peak gradient, 12.5 mmHg. The mean mitral valve  gradient is 7.0 mmHg.   Tricuspid Valve: The tricuspid valve is normal in structure. Tricuspid  valve regurgitation is mild . No evidence of tricuspid stenosis.   Aortic Valve: The aortic valve is tricuspid. Aortic valve regurgitation is  trivial. Aortic regurgitation PHT measures 450 msec. No aortic stenosis is  present. Aortic valve mean gradient measures 5.0 mmHg. Aortic valve  peak  gradient measures 10.1 mmHg.  Aortic valve area, by VTI measures 2.42 cm.   Pulmonic Valve: The pulmonic valve was not well visualized. Pulmonic valve  regurgitation is not visualized. No evidence of pulmonic stenosis.   Aorta: The aortic root is normal in size and structure.   Venous: The inferior vena cava was not well visualized.   IAS/Shunts: The interatrial septum was not well visualized.      LEFT VENTRICLE  PLAX 2D  LVIDd:         5.00 cm   Diastology  LVIDs:         3.30 cm   LV e' lateral:   6.53 cm/s  LV PW:         1.20 cm   LV E/e' lateral: 14.6  LV IVS:        1.20 cm  LVOT diam:     2.10 cm  LV SV:         69  LV SV Index:   33  LVOT Area:     3.46 cm      RIGHT VENTRICLE  TAPSE (M-mode): 2.1 cm   LEFT ATRIUM             Index  RIGHT ATRIUM           Index  LA Vol (A2C):   74.0 ml 34.92 ml/m  RA Area:     12.00 cm  LA Vol (A4C):   75.0 ml 35.39 ml/m  RA Volume:   22.90 ml  10.81 ml/m  LA Biplane Vol: 76.0 ml 35.86 ml/m   AORTIC VALVE  AV Area (Vmax):    2.31 cm  AV Area (Vmean):   2.26 cm  AV Area (VTI):     2.42 cm  AV Vmax:           159.00 cm/s  AV Vmean:          106.000 cm/s  AV VTI:            0.285 m  AV Peak Grad:      10.1 mmHg  AV Mean Grad:      5.0 mmHg  LVOT Vmax:         106.00 cm/s  LVOT Vmean:        69.200 cm/s  LVOT VTI:          0.199 m  LVOT/AV VTI ratio: 0.70  AI PHT:            450 msec     AORTA  Ao Root diam: 3.10 cm   MITRAL VALVE                TRICUSPID VALVE  MV Area (PHT): 3.37 cm     TR Peak grad:   43.8 mmHg  MV Area VTI:   1.63 cm     TR Vmax:        331.00 cm/s  MV Peak grad:  12.5 mmHg  MV Mean grad:  7.0 mmHg     SHUNTS  MV Vmax:       1.77 m/s     Systemic VTI:  0.20 m  MV Vmean:      121.5 cm/s   Systemic Diam: 2.10 cm  MV Decel Time: 225 msec  MV E velocity: 95.40 cm/s  MV A velocity: 173.00 cm/s  MV E/A ratio:  0.55     1. OPEN REDUCTION INTERNAL FIXATION OF LEFT PERIPROSTHETIC  FEMUR FRACTURE WITH BIOMET NCB PLATE 2. APPLICATION OF STRESS UNDER FLUOROSCOPY TO THE LEFT KNEE  Antimicrobials:  Anti-infectives (From admission, onward)    Start     Dose/Rate Route Frequency Ordered Stop   03/25/22 1800  ceFAZolin (ANCEF) IVPB 2g/100 mL premix        2 g 200 mL/hr over 30 Minutes Intravenous Every 6 hours 03/25/22 1705 03/26/22 1619   03/25/22 1424  vancomycin (VANCOCIN) powder  Status:  Discontinued          As needed 03/25/22 1424 03/25/22 1507   03/25/22 1000  ceFAZolin (ANCEF) IVPB 2g/100 mL premix        2 g 200 mL/hr over 30 Minutes Intravenous To ShortStay Surgical 03/25/22 0704 03/25/22 1200   03/24/22 1200  cefTRIAXone (ROCEPHIN) 1 g in sodium chloride 0.9 % 100 mL IVPB  Status:  Discontinued        1 g 200 mL/hr over 30 Minutes Intravenous  Once 03/24/22 1149 03/24/22 1220   03/24/22 1200  azithromycin (ZITHROMAX) 500 mg in sodium chloride 0.9 % 250 mL IVPB  Status:  Discontinued        500 mg 250 mL/hr over 60 Minutes Intravenous  Once 03/24/22 1149 03/24/22 1220  Subjective: Seen and examined at bedside and the patient was doing fairly well.  No nausea or vomiting.  Has a Foley catheter in place which is draining well.  Her wound from her hip was draining a little bit.  No chest pain or shortness of breath.  Denies any other concerns or complaints at this time.  Objective: Vitals:   03/25/22 1643 03/25/22 2157 03/26/22 0810 03/26/22 1614  BP: (!) 139/108 113/62 (!) 109/59 111/62  Pulse: 99 93 82 91  Resp:  '16 18 20  '$ Temp: 98.1 F (36.7 C) 98.7 F (37.1 C) 97.6 F (36.4 C) 98.6 F (37 C)  TempSrc:  Oral Oral Oral  SpO2: 100% 99% 95% 94%  Weight:      Height:        Intake/Output Summary (Last 24 hours) at 03/26/2022 1648 Last data filed at 03/26/2022 1500 Gross per 24 hour  Intake 680 ml  Output 1000 ml  Net -320 ml   Filed Weights   03/24/22 1149 03/25/22 1518  Weight: 95.3 kg 115.6 kg   Examination: Physical  Exam:  Constitutional: WN/WD morbidly obese African-American female Respiratory: Diminished to auscultation bilaterally, no wheezing, rales, rhonchi or crackles. Normal respiratory effort and patient is not tachypenic. No accessory muscle use.  Unlabored breathing Cardiovascular: RRR, no murmurs / rubs / gallops. S1 and S2 auscultated.  Mild extremity edema Abdomen: Soft, non-tender, distended secondary body habitus. Bowel sounds positive.  GU: Deferred. Musculoskeletal: No clubbing / cyanosis of digits/nails. No joint deformity upper and lower extremities.  Skin: Right hip incision is draining and slightly bloody Neurologic: CN 2-12 grossly intact with no focal deficits. Romberg sign and cerebellar reflexes not assessed.  Psychiatric: Normal judgment and insight. Alert and oriented x 3. Normal mood and appropriate affect.   Data Reviewed: I have personally reviewed following labs and imaging studies  CBC: Recent Labs  Lab 03/24/22 1400 03/25/22 0235 03/26/22 0103  WBC 6.0 6.1 9.9  NEUTROABS 3.9  --   --   HGB 12.0 11.1* 9.4*  HCT 38.2 36.4 29.1*  MCV 92.3 94.1 90.1  PLT 165 162 301   Basic Metabolic Panel: Recent Labs  Lab 03/24/22 1400 03/24/22 1828 03/26/22 0103  NA 140 140 137  K 4.4 4.5 4.2  CL 102 103 100  CO2 '28 27 26  '$ GLUCOSE 205* 163* 238*  BUN '13 13 18  '$ CREATININE 0.64 0.61 0.78  CALCIUM 9.0 8.8* 8.3*  MG  --  1.9  --   PHOS  --  3.3  --    GFR: Estimated Creatinine Clearance: 59.7 mL/min (by C-G formula based on SCr of 0.78 mg/dL). Liver Function Tests: Recent Labs  Lab 03/24/22 1828  AST 20  ALT 13  ALKPHOS 68  BILITOT 0.6  PROT 6.0*  ALBUMIN 2.9*   No results for input(s): "LIPASE", "AMYLASE" in the last 168 hours. No results for input(s): "AMMONIA" in the last 168 hours. Coagulation Profile: Recent Labs  Lab 03/24/22 1400  INR 1.1   Cardiac Enzymes: No results for input(s): "CKTOTAL", "CKMB", "CKMBINDEX", "TROPONINI" in the last 168  hours. BNP (last 3 results) No results for input(s): "PROBNP" in the last 8760 hours. HbA1C: Recent Labs    03/24/22 1828  HGBA1C 7.8*   CBG: Recent Labs  Lab 03/25/22 1748 03/25/22 2101 03/26/22 0813 03/26/22 1237 03/26/22 1612  GLUCAP 157* 219* 241* 215* 194*   Lipid Profile: No results for input(s): "CHOL", "HDL", "LDLCALC", "TRIG", "CHOLHDL", "LDLDIRECT" in the last  72 hours. Thyroid Function Tests: No results for input(s): "TSH", "T4TOTAL", "FREET4", "T3FREE", "THYROIDAB" in the last 72 hours. Anemia Panel: No results for input(s): "VITAMINB12", "FOLATE", "FERRITIN", "TIBC", "IRON", "RETICCTPCT" in the last 72 hours. Sepsis Labs: No results for input(s): "PROCALCITON", "LATICACIDVEN" in the last 168 hours.  No results found for this or any previous visit (from the past 240 hour(s)).   Radiology Studies: ECHOCARDIOGRAM COMPLETE  Result Date: 03/26/2022    ECHOCARDIOGRAM REPORT   Patient Name:   Toni [name] Date of Exam: 03/26/2022 Medical Rec #:  [id number]       Height:       62.0 in Accession #:    [id number]      Weight:       254.9 lb Date of Birth:  [birth year]        BSA:          2.119 m Patient Age:    89 years        BP:           113/62 mmHg Patient Gender: F               HR:           78 bpm. Exam Location:  Inpatient Procedure: 2D Echo, Cardiac Doppler and Color Doppler Indications:    CHF  History:        Patient has prior history of Echocardiogram examinations, most                 recent 09/03/2016. Risk Factors:Hypertension and Diabetes.  Sonographer:    Memory Argue Referring Phys: [place]  1. Left ventricular ejection fraction, by estimation, is 65 to 70%. The left ventricle has normal function. The left ventricle has no regional wall motion abnormalities. There is mild concentric left ventricular hypertrophy. Left ventricular diastolic parameters are consistent with Grade I diastolic dysfunction (impaired relaxation).  2. Right ventricular  systolic function is normal. The right ventricular size is normal.  3. Left atrial size was mildly dilated.  4. The mitral valve is normal in structure. No evidence of mitral valve regurgitation. Mild mitral stenosis. Severe mitral annular calcification.  5. The aortic valve is tricuspid. Aortic valve regurgitation is trivial. No aortic stenosis is present. FINDINGS  Left Ventricle: Left ventricular ejection fraction, by estimation, is 65 to 70%. The left ventricle has normal function. The left ventricle has no regional wall motion abnormalities. The left ventricular internal cavity size was normal in size. There is  mild concentric left ventricular hypertrophy. Left ventricular diastolic parameters are consistent with Grade I diastolic dysfunction (impaired relaxation). Right Ventricle: The right ventricular size is normal. Right ventricular systolic function is normal. Left Atrium: Left atrial size was mildly dilated. Right Atrium: Right atrial size was normal in size. Pericardium: There is no evidence of pericardial effusion. Mitral Valve: The mitral valve is normal in structure. Severe mitral annular calcification. No evidence of mitral valve regurgitation. Mild mitral valve stenosis. MV peak gradient, 12.5 mmHg. The mean mitral valve gradient is 7.0 mmHg. Tricuspid Valve: The tricuspid valve is normal in structure. Tricuspid valve regurgitation is mild . No evidence of tricuspid stenosis. Aortic Valve: The aortic valve is tricuspid. Aortic valve regurgitation is trivial. Aortic regurgitation PHT measures 450 msec. No aortic stenosis is present. Aortic valve mean gradient measures 5.0 mmHg. Aortic valve peak gradient measures 10.1 mmHg. Aortic valve area, by VTI measures 2.42 cm. Pulmonic Valve: The pulmonic valve was not well visualized.  Pulmonic valve regurgitation is not visualized. No evidence of pulmonic stenosis. Aorta: The aortic root is normal in size and structure. Venous: The inferior vena cava was  not well visualized. IAS/Shunts: The interatrial septum was not well visualized.  LEFT VENTRICLE PLAX 2D LVIDd:         5.00 cm   Diastology LVIDs:         3.30 cm   LV e' lateral:   6.53 cm/s LV PW:         1.20 cm   LV E/e' lateral: 14.6 LV IVS:        1.20 cm LVOT diam:     2.10 cm LV SV:         69 LV SV Index:   33 LVOT Area:     3.46 cm  RIGHT VENTRICLE TAPSE (M-mode): 2.1 cm LEFT ATRIUM             Index        RIGHT ATRIUM           Index LA Vol (A2C):   74.0 ml 34.92 ml/m  RA Area:     12.00 cm LA Vol (A4C):   75.0 ml 35.39 ml/m  RA Volume:   22.90 ml  10.81 ml/m LA Biplane Vol: 76.0 ml 35.86 ml/m  AORTIC VALVE AV Area (Vmax):    2.31 cm AV Area (Vmean):   2.26 cm AV Area (VTI):     2.42 cm AV Vmax:           159.00 cm/s AV Vmean:          106.000 cm/s AV VTI:            0.285 m AV Peak Grad:      10.1 mmHg AV Mean Grad:      5.0 mmHg LVOT Vmax:         106.00 cm/s LVOT Vmean:        69.200 cm/s LVOT VTI:          0.199 m LVOT/AV VTI ratio: 0.70 AI PHT:            450 msec  AORTA Ao Root diam: 3.10 cm MITRAL VALVE                TRICUSPID VALVE MV Area (PHT): 3.37 cm     TR Peak grad:   43.8 mmHg MV Area VTI:   1.63 cm     TR Vmax:        331.00 cm/s MV Peak grad:  12.5 mmHg MV Mean grad:  7.0 mmHg     SHUNTS MV Vmax:       1.77 m/s     Systemic VTI:  0.20 m MV Vmean:      121.5 cm/s   Systemic Diam: 2.10 cm MV Decel Time: 225 msec MV E velocity: 95.40 cm/s MV A velocity: 173.00 cm/s MV E/A ratio:  0.55 Kirk Ruths MD Electronically signed by Kirk Ruths MD Signature Date/Time: 03/26/2022/12:21:53 PM    Final    DG Knee Left Port  Result Date: 03/25/2022 CLINICAL DATA:  Postop femur surgery. EXAM: PORTABLE LEFT KNEE - 1-2 VIEW COMPARISON:  Preoperative imaging FINDINGS: It is new lateral plate and multi screw fixation of periprosthetic distal femur fracture. There is improved alignment from preoperative imaging with mild residual fracture displacement. Proximal most aspect of the hardware  is not entirely included in the field of view. Previous knee arthroplasty. Recent postsurgical change includes  air and edema in the soft tissues. IMPRESSION: ORIF of periprosthetic distal femur fracture, in improved alignment from preoperative imaging. Electronically Signed   By: Keith Rake M.D.   On: 03/25/2022 18:45   DG FEMUR MIN 2 VIEWS LEFT  Result Date: 03/25/2022 CLINICAL DATA:  Open reduction internal fixation left femur. EXAM: LEFT FEMUR 2 VIEWS COMPARISON:  Preoperative radiograph yesterday. FINDINGS: Six fluoroscopic spot views of the left femur obtained in the operating room. Lateral plate and multi screw fixation of periprosthetic distal femur fracture. Fluoroscopy time 1 minutes 58 seconds. Dose 33.58 mGy. IMPRESSION: Procedural fluoroscopy for periprosthetic distal femur fracture fixation. Electronically Signed   By: Keith Rake M.D.   On: 03/25/2022 15:11   DG C-Arm 1-60 Min-No Report  Result Date: 03/25/2022 Fluoroscopy was utilized by the requesting physician.  No radiographic interpretation.   DG C-Arm 1-60 Min-No Report  Result Date: 03/25/2022 Fluoroscopy was utilized by the requesting physician.  No radiographic interpretation.    Scheduled Meds:  aspirin  81 mg Oral QPM   carvedilol  3.125 mg Oral BID WC   cholecalciferol  2,000 Units Oral BID   docusate sodium  100 mg Oral BID   [START ON 03/27/2022] enoxaparin (LOVENOX) injection  55 mg Subcutaneous Q24H   fentaNYL (SUBLIMAZE) injection  50 mcg Intravenous Once   furosemide  40 mg Intravenous BID   insulin aspart  0-20 Units Subcutaneous TID WC   senna-docusate  1 tablet Oral BID   simvastatin  40 mg Oral Daily   Vitamin D (Ergocalciferol)  50,000 Units Oral Q7 days   Continuous Infusions:   LOS: 2 days   Raiford Noble, DO [facility] Available via Epic secure chat 7am-7pm After these hours, please refer to coverage provider listed on [email or web address] 03/26/2022, 4:48 PM

## 2022-03-26 NOTE — [place] FL2 (Signed)
[place] LEVEL OF CARE SCREENING TOOL     IDENTIFICATION  Patient Name: Toni [name] Birthdate: 26-Aug-1932 Sex: female Admission Date (Current Location): 03/24/2022  [facility] and [place] Number:  Herbalist and Address:  [facility] [place], [place] [address] [place], [place] [address]      Provider Number: [id number]  Attending Physician Name and Address:  Kerney Elbe, DO  Relative Name and Phone Number:  Pamala Hurry [phone]    Current Level of Care: Hospital Recommended Level of Care: [place] Prior Approval Number:    Date Approved/Denied:   PASRR Number: [id number]  Discharge Plan: SNF    Current Diagnoses: Patient Active Problem List   Diagnosis Date Noted   Stress fracture, left femur, initial encounter for fracture 03/24/2022   Femur fracture ([place]) 03/24/2022   Sepsis ([place])    Goals of care, counseling/discussion    Palliative care by specialist    Streptococcal sepsis ([place]) 09/02/2016   Streptococcal bacteremia 09/02/2016   E. coli UTI 84/13/2440   Acute metabolic encephalopathy 05/15/2535   Leukocytosis 09/02/2016   Community acquired pneumonia 08/31/2016    Orientation RESPIRATION BLADDER Height & Weight     Self, Place, Situation  Normal Continent, Indwelling catheter Weight: 254 lb 13.6 oz (115.6 kg) Height:  '5\' 2"'$  (157.5 cm)  BEHAVIORAL SYMPTOMS/MOOD NEUROLOGICAL BOWEL NUTRITION STATUS      Continent Diet (See Dc summary)  AMBULATORY STATUS COMMUNICATION OF NEEDS Skin   Extensive Assist Verbally Surgical wounds (L leg incision)                       Personal Care Assistance Level of Assistance  Bathing, Feeding, Dressing Bathing Assistance: Maximum assistance Feeding assistance: Maximum assistance Dressing Assistance: Maximum assistance     Functional Limitations Info  Sight, Hearing, Speech Sight Info: Adequate Hearing Info: Adequate Speech Info: Adequate    SPECIAL  CARE FACTORS FREQUENCY  PT (By licensed PT), OT (By licensed OT)     PT Frequency: 5x week OT Frequency: 5x week            Contractures Contractures Info: Not present    Additional Factors Info  Code Status, Allergies, Insulin Sliding Scale Code Status Info: Full Allergies Info: NKA   Insulin Sliding Scale Info: See DC summary       Current Medications (03/26/2022):  This is the current hospital active medication list Current Facility-Administered Medications  Medication Dose Route Frequency Provider Last Rate Last Admin   albuterol (PROVENTIL) (2.5 MG/3ML) 0.083% nebulizer solution 2.5 mg  2.5 mg Nebulization Q4H PRN Ainsley Spinner, PA-C       aspirin chewable tablet 81 mg  81 mg Oral QPM Ainsley Spinner, PA-C   81 mg at 03/26/22 1830   bisacodyl (DULCOLAX) EC tablet 5 mg  5 mg Oral Daily PRN Ainsley Spinner, PA-C       carvedilol (COREG) tablet 3.125 mg  3.125 mg Oral BID WC Ainsley Spinner, PA-C   3.125 mg at 03/26/22 1829   cholecalciferol (VITAMIN D3) 25 MCG (1000 UNIT) tablet 2,000 Units  2,000 Units Oral BID Ainsley Spinner, PA-C   2,000 Units at 03/26/22 1520   docusate sodium (COLACE) capsule 100 mg  100 mg Oral BID Ainsley Spinner, PA-C   100 mg at 03/26/22 0920   [START ON 03/27/2022] enoxaparin (LOVENOX) injection 55 mg  55 mg Subcutaneous Q24H Ainsley Spinner, PA-C       fentaNYL (  SUBLIMAZE) injection 50 mcg  50 mcg Intravenous Once Ainsley Spinner, PA-C       furosemide (LASIX) injection 40 mg  40 mg Intravenous BID Ainsley Spinner, PA-C   40 mg at 03/26/22 1831   hydrALAZINE (APRESOLINE) tablet 25 mg  25 mg Oral Q6H PRN Ainsley Spinner, PA-C       HYDROcodone-acetaminophen (NORCO/VICODIN) 5-325 MG per tablet 1-2 tablet  1-2 tablet Oral Q6H PRN Ainsley Spinner, PA-C   2 tablet at 03/25/22 2207   HYDROmorphone (DILAUDID) injection 0.5 mg  0.5 mg Intravenous Q2H PRN Ainsley Spinner, PA-C       [START ON 03/27/2022] insulin aspart (novoLOG) injection 0-9 Units  0-9 Units Subcutaneous TID WC Sheikh, Omair Latif, DO        metoCLOPramide (REGLAN) tablet 5-10 mg  5-10 mg Oral Q8H PRN Ainsley Spinner, PA-C       Or   metoCLOPramide (REGLAN) injection 5-10 mg  5-10 mg Intravenous Q8H PRN Ainsley Spinner, PA-C       morphine (PF) 4 MG/ML injection 4 mg  4 mg Intravenous Q4H PRN Ainsley Spinner, PA-C   4 mg at 03/24/22 1405   ondansetron (ZOFRAN) tablet 4 mg  4 mg Oral Q6H PRN Ainsley Spinner, PA-C       Or   ondansetron [facility]) injection 4 mg  4 mg Intravenous Q6H PRN Ainsley Spinner, PA-C   4 mg at 03/26/22 1054   senna-docusate (Senokot-S) tablet 1 tablet  1 tablet Oral BID Ainsley Spinner, PA-C   1 tablet at 03/26/22 0920   senna-docusate (Senokot-S) tablet 1 tablet  1 tablet Oral QHS PRN Ainsley Spinner, PA-C       simvastatin (ZOCOR) tablet 40 mg  40 mg Oral Daily Ainsley Spinner, PA-C   40 mg at 03/26/22 0920   Vitamin D (Ergocalciferol) (DRISDOL) 1.25 MG (50000 UNIT) capsule 50,000 Units  50,000 Units Oral Q7 days Ainsley Spinner, PA-C   50,000 Units at 03/26/22 1612     Discharge Medications: Please see discharge summary for a list of discharge medications.  Relevant Imaging Results:  Relevant Lab Results:   Additional Information SS# [place], LCSWA

## 2022-03-26 NOTE — Care Management Important Message (Signed)
Important Message  Patient Details  Name: Toni [name] MRN: [id number] Date of Birth: 12-26-1932   Medicare Important Message Given:  Yes     Hannah Beat 03/26/2022, 1:02 PM

## 2022-03-27 DIAGNOSIS — M84352A Stress fracture, left femur, initial encounter for fracture: Secondary | ICD-10-CM | POA: Diagnosis not present

## 2022-03-27 DIAGNOSIS — S7292XA Unspecified fracture of left femur, initial encounter for closed fracture: Secondary | ICD-10-CM | POA: Diagnosis not present

## 2022-03-27 DIAGNOSIS — I5033 Acute on chronic diastolic (congestive) heart failure: Secondary | ICD-10-CM | POA: Diagnosis not present

## 2022-03-27 LAB — COMPREHENSIVE METABOLIC PANEL
ALT: 12 U/L (ref 0–44)
AST: 18 U/L (ref 15–41)
Albumin: 2.7 g/dL — ABNORMAL LOW (ref 3.5–5.0)
Alkaline Phosphatase: 51 U/L (ref 38–126)
Anion gap: 10 (ref 5–15)
BUN: 22 mg/dL (ref 8–23)
CO2: 30 mmol/L (ref 22–32)
Calcium: 8.4 mg/dL — ABNORMAL LOW (ref 8.9–10.3)
Chloride: 94 mmol/L — ABNORMAL LOW (ref 98–111)
Creatinine, Ser: 0.74 mg/dL (ref 0.44–1.00)
GFR, Estimated: >60 mL/min (ref >60)
Glucose, Bld: 180 mg/dL — ABNORMAL HIGH (ref 70–99)
Potassium: 3.6 mmol/L (ref 3.5–5.1)
Sodium: 134 mmol/L — ABNORMAL LOW (ref 135–145)
Total Bilirubin: 0.9 mg/dL (ref 0.3–1.2)
Total Protein: 5.6 g/dL — ABNORMAL LOW (ref 6.5–8.1)

## 2022-03-27 LAB — CBC WITH DIFFERENTIAL/PLATELET
Abs Immature Granulocytes: 0.1 10*3/uL — ABNORMAL HIGH (ref 0.00–0.07)
Basophils Absolute: 0 10*3/uL (ref 0.0–0.1)
Basophils Relative: 0 %
Eosinophils Absolute: 0 10*3/uL (ref 0.0–0.5)
Eosinophils Relative: 0 %
HCT: 27.5 % — ABNORMAL LOW (ref 36.0–46.0)
Hemoglobin: 9 g/dL — ABNORMAL LOW (ref 12.0–15.0)
Immature Granulocytes: 1 %
Lymphocytes Relative: 27 %
Lymphs Abs: 2.3 10*3/uL (ref 0.7–4.0)
MCH: 29.2 pg (ref 26.0–34.0)
MCHC: 32.7 g/dL (ref 30.0–36.0)
MCV: 89.3 fL (ref 80.0–100.0)
Monocytes Absolute: 1 10*3/uL (ref 0.1–1.0)
Monocytes Relative: 12 %
Neutro Abs: 5.1 10*3/uL (ref 1.7–7.7)
Neutrophils Relative %: 60 %
Platelets: 176 10*3/uL (ref 150–400)
RBC: 3.08 MIL/uL — ABNORMAL LOW (ref 3.87–5.11)
RDW: 12.3 % (ref 11.5–15.5)
WBC: 8.6 10*3/uL (ref 4.0–10.5)
nRBC: 0.4 % — ABNORMAL HIGH (ref 0.0–0.2)

## 2022-03-27 LAB — URINE CULTURE: Culture: <10000 — AB

## 2022-03-27 LAB — IRON AND TIBC
Iron: 40 ug/dL (ref 28–170)
Saturation Ratios: 16 % (ref 10.4–31.8)
TIBC: 249 ug/dL — ABNORMAL LOW (ref 250–450)
UIBC: 209 ug/dL

## 2022-03-27 LAB — VITAMIN B12: Vitamin B-12: 1480 pg/mL — ABNORMAL HIGH (ref 180–914)

## 2022-03-27 LAB — GLUCOSE, CAPILLARY
Glucose-Capillary: 147 mg/dL — ABNORMAL HIGH (ref 70–99)
Glucose-Capillary: 165 mg/dL — ABNORMAL HIGH (ref 70–99)
Glucose-Capillary: 244 mg/dL — ABNORMAL HIGH (ref 70–99)
Glucose-Capillary: 214 mg/dL — ABNORMAL HIGH (ref 70–99)

## 2022-03-27 LAB — FOLATE: Folate: 13.8 ng/mL (ref >5.9)

## 2022-03-27 LAB — RETICULOCYTES
Immature Retic Fract: 25.5 % — ABNORMAL HIGH (ref 2.3–15.9)
RBC.: 3.02 MIL/uL — ABNORMAL LOW (ref 3.87–5.11)
Retic Count, Absolute: 94.2 10*3/uL (ref 19.0–186.0)
Retic Ct Pct: 3.1 % (ref 0.4–3.1)

## 2022-03-27 LAB — MAGNESIUM: Magnesium: 1.8 mg/dL (ref 1.7–2.4)

## 2022-03-27 LAB — PHOSPHORUS: Phosphorus: 3 mg/dL (ref 2.5–4.6)

## 2022-03-27 LAB — FERRITIN: Ferritin: 80 ng/mL (ref 11–307)

## 2022-03-27 MED ORDER — MAGNESIUM SULFATE 2 GM/50ML IV SOLN
2.0000 g | Freq: Once | INTRAVENOUS | Status: AC
  Administered 2022-03-27: 2 g via INTRAVENOUS
  Filled 2022-03-27: qty 50

## 2022-03-27 MED ORDER — FUROSEMIDE 40 MG PO TABS
40.0000 mg | ORAL_TABLET | Freq: Every day | ORAL | Status: DC
  Administered 2022-03-28 – 2022-03-29 (×2): 40 mg via ORAL
  Filled 2022-03-27 (×3): qty 1

## 2022-03-27 NOTE — Progress Notes (Signed)
PROGRESS NOTE    Toni [name]  GOT:[id number] DOB: [birth year] DOA: 03/24/2022 PCP: [facility]   Brief Narrative:  HPI per Dr. Wynetta Fines on 03/24/22 HPI: Toni [name] is a 88 y.o. female with medical history significant of HTN, HLD, IDDM on diet control, morbid obesity, chronic HFpEF, severe pulmonary hypertension, knee OA status post left TKA in 2003, PVD, chronic ambulation dysfunction, presented with mechanical fall and left leg fracture.   Patient has chronic ambulation dysfunction, uses a walker to ambulate, and nighttime use of bedside commode.  Last night, patient woke up to use the commode but she rolled and lost balance and fell on the floor on left leg.  Denies any chest pain lightheadedness of prodromes.   At baseline, patient uses roller walker to ambulate because of chronic knee problems for more than 8 years.  She reported frequent shortness of breath and chronic productive cough " a lot of phlegms" as well as chronic bilateral lower extremity swelling.  She used to be on Lasix but stopped taking "for sometime", she used to be on insulin for her diabetes but at least this year, she has been only on diet control for unclear reasons.   **Interim History  She underwent surgical intervention by Dr. Ginette Pitman and today she is postoperative day 2.  Echocardiogram was done and she continues to be on diuretics but will stop and change to p.o. diuretics.  Orthopedic surgery recommending continuing the Foley catheter yesterday and will remove today.  PT OT recommending SNF.  The operation side started having some bleeding so is reinforced with Mepilex yesterday and again had her dressings reinforced today.  Assessment and Plan:  Acute left femur fracture status post ORIF of the left periprosthetic femur fracture with Biomet NCB plate and application stress under fluoroscopy to the left knee -Scheduled OR tomorrow, Dr. Roosevelt Locks discussed with orthopedic surgery PA, likely will be postponed due  to recent block. -Pain control per orthopedic surgery along with DVT prophylaxis -We will need PT OT to further evaluate and treat for safe discharge disposition they are recommending SNF -She was taken to the OR by Dr. Marcelino Scot for an open reduction internal fixation of the left periprosthetic femur fracture with Biomet NCB plate and application of stress under fluoroscopy to the left knee -Continue with bowel regimen with bisacodyl 5 mg p.o. daily as needed for moderate constipation or, docusate 100 mg p.o. twice daily, senna docusate 1 tab p.o. twice daily as well as as needed -Continue with pain control with morphine 4 mg IV every 4 as needed and hydromorphone 0.5 mg IV every 2 as needed for severe pain and has hydrocodone-acetaminophen 1 tab p.o. every 6 as needed for moderate pain -Cardiology evaluated preoperatively and they feel that they cannot lower her cardiac risk with any further testing or intervention and are recommending avoiding excessive IV fluids to prevent volume overload and continue monitoring if perioperative complications -Vitamin D level was 15.39 -Further care per orthopedic surgery and they are recommending either Xarelto or Eliquis at discharge for DVT prophylaxis as she is currently on enoxaparin 55 mg subcu q. 24 -Per orthopedic surgery they are recommending nonweightbearing on the left lower extremity and sliders left transfers and unrestricted range of motion of the left knee.  She had her dressings reviewed for today and had a new dressing placed.   Acute on Chronic HFpEF decompensation, improved -Significant fluid overload, peripheral edema and bilateral crackles, continued IV Lasix 40 mg IV twice daily  but have now stopped given her improvement.  -BNP was elevated at 309 -Echocardiogram ordered and showed G1DD with EF of 65-70% -Echocardiogram 2019 showed significant abnormalities with significant severe pulmonary hypertension, and mild to moderate mitral regurgitation.   On physical exam she does have multiple murmurs and S3.  She has poor activity tolerance but significant exertional dyspnea with minimal activity at home.   -Cardiology consulted for further evaluation -Start beta-blocker -She will need strict I's and O's and daily weights; she is -680 mL since admission -Continue to monitor for signs and symptoms of volume overload -Foley catheter was in place and will discontinue given that we are stopping her IV diuretics" we will resume oral diuretics in the a.m with Lasix 40 mg p.o. daily -DVT prophylaxis per orthopedic surgery now she is on enoxaparin 40 mg subcu every 24 and also on aspirin 81 mg p.o. daily -Continuing carvedilol 3.125 mg p.o. twice daily -Repeat chest x-ray yesterday showed "The heart size and mediastinal contours are within normal limits. Both lungs are clear. No acute fractures. Degenerative changes affect the spine and shoulders."   IDDM with Hyperglycemia -Not adherent with insulin regimen, reason no known -Start sliding scale and was on resistant scale but will cut down to the sensitive NovoLog sign scale -Continue monitor CBGs per protocol; CBGs ranging from 147-244   Pyuria and bacteriuria -Patient was having frequent urination but she is on Lasix; unclear why the Ortho team is worried this given that she is asymptomatic -Urinalysis showed a hazy appearance with amber-colored urine, moderate hemoglobin, moderate leukocytes, rare bacteria, greater than 50 RBCs per high-power field and 21-50 WBCs with 0-5 squamous epithelial cells with a urine culture showing less than 10,000 colonies of insignificant growth -Currently do not have a clinical inclination to treat given that she has no symptoms   HTN -On Lasix as above but will stop and resume her home Lasix given that she appears closer to being euvolemic -hold off verapamil, start Coreg -Continue to monitor blood pressures per protocol -Last blood pressure reading was elevated  at 139/108   Pulmonary HTN -In decompensation with significant peripheral edema, IV Lasix given and continued through today and will change to p.o. tomorrow -ECHO ordered and done and showed ". Left ventricular ejection fraction, by estimation, is 65 to 70%. The  left ventricle has normal function. The left ventricle has no regional wall motion abnormalities. There is mild concentric left ventricular hypertrophy. Left ventricular diastolic  parameters are consistent with Grade I diastolic dysfunction (impaired relaxation). Right ventricular systolic function is normal. The right ventricular size is normal. Left atrial size was mildly dilated. The mitral valve is normal in structure. No evidence of mitral valve regurgitation. Mild mitral stenosis. Severe mitral annular calcification. The aortic valve is tricuspid. Aortic valve regurgitation is trivial. No aortic stenosis is present."   Prolonged Qtc -Appears to be a chronic issue, no significant QTc changes compared to EKG in 2018. -Continue Telemetry Monitoring    PVD -CAD equivalent -Considered to be mild on the right side of the leg with the ABI study in 2018 and normal ABI on left side in 2018. -Continue aspirin, outpatient follow-up with PCP.   Obesity -Complicates overall prognosis and care -Estimated body mass index is 46.61 kg/m as calculated from the following:   Height as of this encounter: '5\' 2"'$  (1.575 m).   Weight as of this encounter: 115.6 kg.  -Weight Loss and Dietary Counseling given     Normocytic Anemia -Patient's  hemoglobin/hematocrit went from 12.0/38.2 ->  11.1/36.4 -> 9.4/27.5 yesterday and as expected dropped postoperatively given her recent surgical intervention and today is 9.0/27.5 -Checked Anemia Panel and showed an iron level of 40, U IBC of 209, TIBC of 249, saturation ratios of 16%, ferritin level 80, folate of 13.8, vitamin B12 1480 Continue to monitor for signs of bleeding; no overt bleeding noted -Repeat  CBC in a.m.   Hypoalbuminemia -Patient's albumin level is now 2.9 -> 2.7 on last check -Continue to monitor and trend and repeat CMP in a.m.   DVT prophylaxis: SCDs Start: 03/25/22 1705    Code Status: Full Code Family Communication: No family currently at bedside  Disposition Plan:  Level of care: Telemetry Medical Status is: Inpatient Remains inpatient appropriate because: Needs SNF   Consultants:  Cardiology Orthopedic Surgery  Procedures:  ECHOCARDIOGRAM IMPRESSIONS    1. Left ventricular ejection fraction, by estimation, is 65 to 70%. The  left ventricle has normal function. The left ventricle has no regional  wall motion abnormalities. There is mild concentric left ventricular  hypertrophy. Left ventricular diastolic  parameters are consistent with Grade I diastolic dysfunction (impaired  relaxation).   2. Right ventricular systolic function is normal. The right ventricular  size is normal.   3. Left atrial size was mildly dilated.   4. The mitral valve is normal in structure. No evidence of mitral valve  regurgitation. Mild mitral stenosis. Severe mitral annular calcification.   5. The aortic valve is tricuspid. Aortic valve regurgitation is trivial.  No aortic stenosis is present.   FINDINGS   Left Ventricle: Left ventricular ejection fraction, by estimation, is 65  to 70%. The left ventricle has normal function. The left ventricle has no  regional wall motion abnormalities. The left ventricular internal cavity  size was normal in size. There is   mild concentric left ventricular hypertrophy. Left ventricular diastolic  parameters are consistent with Grade I diastolic dysfunction (impaired  relaxation).   Right Ventricle: The right ventricular size is normal. Right ventricular  systolic function is normal.   Left Atrium: Left atrial size was mildly dilated.   Right Atrium: Right atrial size was normal in size.   Pericardium: There is no evidence of  pericardial effusion.   Mitral Valve: The mitral valve is normal in structure. Severe mitral  annular calcification. No evidence of mitral valve regurgitation. Mild  mitral valve stenosis. MV peak gradient, 12.5 mmHg. The mean mitral valve  gradient is 7.0 mmHg.   Tricuspid Valve: The tricuspid valve is normal in structure. Tricuspid  valve regurgitation is mild . No evidence of tricuspid stenosis.   Aortic Valve: The aortic valve is tricuspid. Aortic valve regurgitation is  trivial. Aortic regurgitation PHT measures 450 msec. No aortic stenosis is  present. Aortic valve mean gradient measures 5.0 mmHg. Aortic valve peak  gradient measures 10.1 mmHg.  Aortic valve area, by VTI measures 2.42 cm.   Pulmonic Valve: The pulmonic valve was not well visualized. Pulmonic valve  regurgitation is not visualized. No evidence of pulmonic stenosis.   Aorta: The aortic root is normal in size and structure.   Venous: The inferior vena cava was not well visualized.   IAS/Shunts: The interatrial septum was not well visualized.      LEFT VENTRICLE  PLAX 2D  LVIDd:         5.00 cm   Diastology  LVIDs:         3.30 cm   LV e' lateral:  6.53 cm/s  LV PW:         1.20 cm   LV E/e' lateral: 14.6  LV IVS:        1.20 cm  LVOT diam:     2.10 cm  LV SV:         69  LV SV Index:   33  LVOT Area:     3.46 cm      RIGHT VENTRICLE  TAPSE (M-mode): 2.1 cm   LEFT ATRIUM             Index        RIGHT ATRIUM           Index  LA Vol (A2C):   74.0 ml 34.92 ml/m  RA Area:     12.00 cm  LA Vol (A4C):   75.0 ml 35.39 ml/m  RA Volume:   22.90 ml  10.81 ml/m  LA Biplane Vol: 76.0 ml 35.86 ml/m   AORTIC VALVE  AV Area (Vmax):    2.31 cm  AV Area (Vmean):   2.26 cm  AV Area (VTI):     2.42 cm  AV Vmax:           159.00 cm/s  AV Vmean:          106.000 cm/s  AV VTI:            0.285 m  AV Peak Grad:      10.1 mmHg  AV Mean Grad:      5.0 mmHg  LVOT Vmax:         106.00 cm/s  LVOT Vmean:         69.200 cm/s  LVOT VTI:          0.199 m  LVOT/AV VTI ratio: 0.70  AI PHT:            450 msec     AORTA  Ao Root diam: 3.10 cm   MITRAL VALVE                TRICUSPID VALVE  MV Area (PHT): 3.37 cm     TR Peak grad:   43.8 mmHg  MV Area VTI:   1.63 cm     TR Vmax:        331.00 cm/s  MV Peak grad:  12.5 mmHg  MV Mean grad:  7.0 mmHg     SHUNTS  MV Vmax:       1.77 m/s     Systemic VTI:  0.20 m  MV Vmean:      121.5 cm/s   Systemic Diam: 2.10 cm  MV Decel Time: 225 msec  MV E velocity: 95.40 cm/s  MV A velocity: 173.00 cm/s  MV E/A ratio:  0.55      1. OPEN REDUCTION INTERNAL FIXATION OF LEFT PERIPROSTHETIC FEMUR FRACTURE WITH BIOMET NCB PLATE 2. APPLICATION OF STRESS UNDER FLUOROSCOPY TO THE LEFT KNEE  Antimicrobials:  Anti-infectives (From admission, onward)    Start     Dose/Rate Route Frequency Ordered Stop   03/25/22 1800  ceFAZolin (ANCEF) IVPB 2g/100 mL premix        2 g 200 mL/hr over 30 Minutes Intravenous Every 6 hours 03/25/22 1705 03/26/22 1619   03/25/22 1424  vancomycin (VANCOCIN) powder  Status:  Discontinued          As needed 03/25/22 1424 03/25/22 1507   03/25/22 1000  ceFAZolin (ANCEF) IVPB 2g/100 mL premix  2 g 200 mL/hr over 30 Minutes Intravenous To ShortStay Surgical 03/25/22 0704 03/25/22 1200   03/24/22 1200  cefTRIAXone (ROCEPHIN) 1 g in sodium chloride 0.9 % 100 mL IVPB  Status:  Discontinued        1 g 200 mL/hr over 30 Minutes Intravenous  Once 03/24/22 1149 03/24/22 1220   03/24/22 1200  azithromycin (ZITHROMAX) 500 mg in sodium chloride 0.9 % 250 mL IVPB  Status:  Discontinued        500 mg 250 mL/hr over 60 Minutes Intravenous  Once 03/24/22 1149 03/24/22 1220       Subjective: Seen and examined at bedside and she states that her pain was doing okay and was a 7 out of 10.  Had no nausea or vomiting.  Foley catheter is in place and she was draining well but will discontinue her Foley catheter given that we are stopping her IV  diuretics.  Wound dressing was reinforced again.  She denies chest pain or shortness breath.  Appears closer to being euvolemic.  No other concerns or plaints at this time.  Objective: Vitals:   03/26/22 1614 03/26/22 2010 03/27/22 0300 03/27/22 0750  BP: 111/62 (!) 86/62 (!) 100/58 (!) 98/59  Pulse: 91 89 100 99  Resp: '20 20 20 20  '$ Temp: 98.6 F (37 C) 98.1 F (36.7 C) 98.3 F (36.8 C) 99 F (37.2 C)  TempSrc: Oral Oral Oral Oral  SpO2: 94% 97% 92% 93%  Weight:      Height:        Intake/Output Summary (Last 24 hours) at 03/27/2022 1722 Last data filed at 03/27/2022 0940 Gross per 24 hour  Intake 240 ml  Output 1100 ml  Net -860 ml   Filed Weights   03/24/22 1149 03/25/22 1518  Weight: 95.3 kg 115.6 kg   Examination: Physical Exam:  Constitutional: WN/WD morbidly obese African-American female in no acute distress Respiratory: Diminished to auscultation bilaterally, no wheezing, rales, rhonchi or crackles. Normal respiratory effort and patient is not tachypenic. No accessory muscle use.  Unlabored breathing Cardiovascular: RRR, no murmurs / rubs / gallops. S1 and S2 auscultated.  Has mild lower extremity edema Abdomen: Soft, non-tender, distended secondary body habitus. Bowel sounds positive.  GU: Deferred.  Foley catheter is in place Musculoskeletal: No clubbing / cyanosis of digits/nails. No joint deformity upper and lower extremities.  Skin: No rashes, lesions, ulcers on limited skin evaluation. No induration; Warm and dry.  Neurologic: CN 2-12 grossly intact with no focal deficits. Romberg sign and cerebellar reflexes not assessed.  Psychiatric: Normal judgment and insight. Alert and oriented x 3. Normal mood and appropriate affect.   Data Reviewed: I have personally reviewed following labs and imaging studies  CBC: Recent Labs  Lab 03/24/22 1400 03/25/22 0235 03/26/22 0103 03/27/22 0104  WBC 6.0 6.1 9.9 8.6  NEUTROABS 3.9  --   --  5.1  HGB 12.0 11.1* 9.4* 9.0*   HCT 38.2 36.4 29.1* 27.5*  MCV 92.3 94.1 90.1 89.3  PLT 165 162 153 774   Basic Metabolic Panel: Recent Labs  Lab 03/24/22 1400 03/24/22 1828 03/26/22 0103 03/27/22 0104  NA 140 140 137 134*  K 4.4 4.5 4.2 3.6  CL 102 103 100 94*  CO2 '28 27 26 30  '$ GLUCOSE 205* 163* 238* 180*  BUN '13 13 18 22  '$ CREATININE 0.64 0.61 0.78 0.74  CALCIUM 9.0 8.8* 8.3* 8.4*  MG  --  1.9  --  1.8  PHOS  --  3.3  --  3.0   GFR: Estimated Creatinine Clearance: 59.7 mL/min (by C-G formula based on SCr of 0.74 mg/dL). Liver Function Tests: Recent Labs  Lab 03/24/22 1828 03/27/22 0104  AST 20 18  ALT 13 12  ALKPHOS 68 51  BILITOT 0.6 0.9  PROT 6.0* 5.6*  ALBUMIN 2.9* 2.7*   No results for input(s): "LIPASE", "AMYLASE" in the last 168 hours. No results for input(s): "AMMONIA" in the last 168 hours. Coagulation Profile: Recent Labs  Lab 03/24/22 1400  INR 1.1   Cardiac Enzymes: No results for input(s): "CKTOTAL", "CKMB", "CKMBINDEX", "TROPONINI" in the last 168 hours. BNP (last 3 results) No results for input(s): "PROBNP" in the last 8760 hours. HbA1C: Recent Labs    03/24/22 1828  HGBA1C 7.8*   CBG: Recent Labs  Lab 03/26/22 1612 03/26/22 1954 03/27/22 0752 03/27/22 1145 03/27/22 1658  GLUCAP 194* 233* 147* 165* 244*   Lipid Profile: No results for input(s): "CHOL", "HDL", "LDLCALC", "TRIG", "CHOLHDL", "LDLDIRECT" in the last 72 hours. Thyroid Function Tests: No results for input(s): "TSH", "T4TOTAL", "FREET4", "T3FREE", "THYROIDAB" in the last 72 hours. Anemia Panel: Recent Labs    03/27/22 0104  VITAMINB12 1,480*  FOLATE 13.8  FERRITIN 80  TIBC 249*  IRON 40  RETICCTPCT 3.1   Sepsis Labs: No results for input(s): "PROCALCITON", "LATICACIDVEN" in the last 168 hours.  Recent Results (from the past 240 hour(s))  Urine Culture     Status: Abnormal   Collection Time: 03/26/22 12:54 PM   Specimen: Urine, Catheterized  Result Value Ref Range Status   Specimen  Description URINE, CATHETERIZED  Final   Special Requests NONE  Final   Culture (A)  Final    <10,000 COLONIES/mL INSIGNIFICANT GROWTH Performed at [facility], [address] [place], [place] [address]    Report Status 03/27/2022 FINAL  Final    Radiology Studies: DG CHEST PORT 1 VIEW  Result Date: 03/26/2022 CLINICAL DATA:  Shortness of breath EXAM: PORTABLE CHEST 1 VIEW COMPARISON:  Chest x-ray 03/24/2022 FINDINGS: The heart size and mediastinal contours are within normal limits. Both lungs are clear. No acute fractures. Degenerative changes affect the spine and shoulders. IMPRESSION: No active disease. Electronically Signed   By: Ronney Asters M.D.   On: 03/26/2022 19:25   ECHOCARDIOGRAM COMPLETE  Result Date: 03/26/2022    ECHOCARDIOGRAM REPORT   Patient Name:   Toni [name] Date of Exam: 03/26/2022 Medical Rec #:  [id number]       Height:       62.0 in Accession #:    [id number]      Weight:       254.9 lb Date of Birth:  12-May-1933        BSA:          2.119 m Patient Age:    5 years        BP:           113/62 mmHg Patient Gender: F               HR:           78 bpm. Exam Location:  Inpatient Procedure: 2D Echo, Cardiac Doppler and Color Doppler Indications:    CHF  History:        Patient has prior history of Echocardiogram examinations, most                 recent 09/03/2016. Risk Factors:Hypertension and Diabetes.  Sonographer:  Memory Argue Referring Phys: [place]  1. Left ventricular ejection fraction, by estimation, is 65 to 70%. The left ventricle has normal function. The left ventricle has no regional wall motion abnormalities. There is mild concentric left ventricular hypertrophy. Left ventricular diastolic parameters are consistent with Grade I diastolic dysfunction (impaired relaxation).  2. Right ventricular systolic function is normal. The right ventricular size is normal.  3. Left atrial size was mildly dilated.  4. The mitral valve is normal in  structure. No evidence of mitral valve regurgitation. Mild mitral stenosis. Severe mitral annular calcification.  5. The aortic valve is tricuspid. Aortic valve regurgitation is trivial. No aortic stenosis is present. FINDINGS  Left Ventricle: Left ventricular ejection fraction, by estimation, is 65 to 70%. The left ventricle has normal function. The left ventricle has no regional wall motion abnormalities. The left ventricular internal cavity size was normal in size. There is  mild concentric left ventricular hypertrophy. Left ventricular diastolic parameters are consistent with Grade I diastolic dysfunction (impaired relaxation). Right Ventricle: The right ventricular size is normal. Right ventricular systolic function is normal. Left Atrium: Left atrial size was mildly dilated. Right Atrium: Right atrial size was normal in size. Pericardium: There is no evidence of pericardial effusion. Mitral Valve: The mitral valve is normal in structure. Severe mitral annular calcification. No evidence of mitral valve regurgitation. Mild mitral valve stenosis. MV peak gradient, 12.5 mmHg. The mean mitral valve gradient is 7.0 mmHg. Tricuspid Valve: The tricuspid valve is normal in structure. Tricuspid valve regurgitation is mild . No evidence of tricuspid stenosis. Aortic Valve: The aortic valve is tricuspid. Aortic valve regurgitation is trivial. Aortic regurgitation PHT measures 450 msec. No aortic stenosis is present. Aortic valve mean gradient measures 5.0 mmHg. Aortic valve peak gradient measures 10.1 mmHg. Aortic valve area, by VTI measures 2.42 cm. Pulmonic Valve: The pulmonic valve was not well visualized. Pulmonic valve regurgitation is not visualized. No evidence of pulmonic stenosis. Aorta: The aortic root is normal in size and structure. Venous: The inferior vena cava was not well visualized. IAS/Shunts: The interatrial septum was not well visualized.  LEFT VENTRICLE PLAX 2D LVIDd:         5.00 cm   Diastology  LVIDs:         3.30 cm   LV e' lateral:   6.53 cm/s LV PW:         1.20 cm   LV E/e' lateral: 14.6 LV IVS:        1.20 cm LVOT diam:     2.10 cm LV SV:         69 LV SV Index:   33 LVOT Area:     3.46 cm  RIGHT VENTRICLE TAPSE (M-mode): 2.1 cm LEFT ATRIUM             Index        RIGHT ATRIUM           Index LA Vol (A2C):   74.0 ml 34.92 ml/m  RA Area:     12.00 cm LA Vol (A4C):   75.0 ml 35.39 ml/m  RA Volume:   22.90 ml  10.81 ml/m LA Biplane Vol: 76.0 ml 35.86 ml/m  AORTIC VALVE AV Area (Vmax):    2.31 cm AV Area (Vmean):   2.26 cm AV Area (VTI):     2.42 cm AV Vmax:           159.00 cm/s AV Vmean:  106.000 cm/s AV VTI:            0.285 m AV Peak Grad:      10.1 mmHg AV Mean Grad:      5.0 mmHg LVOT Vmax:         106.00 cm/s LVOT Vmean:        69.200 cm/s LVOT VTI:          0.199 m LVOT/AV VTI ratio: 0.70 AI PHT:            450 msec  AORTA Ao Root diam: 3.10 cm MITRAL VALVE                TRICUSPID VALVE MV Area (PHT): 3.37 cm     TR Peak grad:   43.8 mmHg MV Area VTI:   1.63 cm     TR Vmax:        331.00 cm/s MV Peak grad:  12.5 mmHg MV Mean grad:  7.0 mmHg     SHUNTS MV Vmax:       1.77 m/s     Systemic VTI:  0.20 m MV Vmean:      121.5 cm/s   Systemic Diam: 2.10 cm MV Decel Time: 225 msec MV E velocity: 95.40 cm/s MV A velocity: 173.00 cm/s MV E/A ratio:  0.55 Kirk Ruths MD Electronically signed by Kirk Ruths MD Signature Date/Time: 03/26/2022/12:21:53 PM    Final    DG Knee Left Port  Result Date: 03/25/2022 CLINICAL DATA:  Postop femur surgery. EXAM: PORTABLE LEFT KNEE - 1-2 VIEW COMPARISON:  Preoperative imaging FINDINGS: It is new lateral plate and multi screw fixation of periprosthetic distal femur fracture. There is improved alignment from preoperative imaging with mild residual fracture displacement. Proximal most aspect of the hardware is not entirely included in the field of view. Previous knee arthroplasty. Recent postsurgical change includes air and edema in the soft  tissues. IMPRESSION: ORIF of periprosthetic distal femur fracture, in improved alignment from preoperative imaging. Electronically Signed   By: Keith Rake M.D.   On: 03/25/2022 18:45    Scheduled Meds:  aspirin  81 mg Oral QPM   carvedilol  3.125 mg Oral BID WC   cholecalciferol  2,000 Units Oral BID   docusate sodium  100 mg Oral BID   enoxaparin (LOVENOX) injection  55 mg Subcutaneous Q24H   feeding supplement  237 mL Oral BID BM   fentaNYL (SUBLIMAZE) injection  50 mcg Intravenous Once   furosemide  40 mg Intravenous BID   insulin aspart  0-9 Units Subcutaneous TID AC & HS   multivitamin with minerals  1 tablet Oral Daily   senna-docusate  1 tablet Oral BID   simvastatin  40 mg Oral Daily   Vitamin D (Ergocalciferol)  50,000 Units Oral Q7 days   Continuous Infusions:   LOS: 3 days   Raiford Noble, DO [facility] Available via Epic secure chat 7am-7pm After these hours, please refer to coverage provider listed on [email or web address] 03/27/2022, 5:22 PM

## 2022-03-27 NOTE — Progress Notes (Signed)
   ORTHOPAEDIC PROGRESS NOTE  s/p Procedure(s): OPEN REDUCTION INTERNAL FIXATION LEFT FEMUR on 03/25/2022 with Dr. Marcelino Scot  SUBJECTIVE: Reports moderate pain about operative site. No chest pain. No SOB. No nausea/vomiting. No other complaints.   OBJECTIVE: PE: LLE: dressing with some drainage noted, new dressing placed, incision CDI, distal motor and sensory function grossly intact, warm well perfused extremity   Vitals:   03/27/22 0300 03/27/22 0750  BP: (!) 100/58 (!) 98/59  Pulse: 100 99  Resp: 20 20  Temp: 98.3 F (36.8 C) 99 F (37.2 C)  SpO2: 92% 93%     ASSESSMENT: Toni [name] is a 88 y.o. female POD#2  PLAN: Weightbearing: NWB LLE  - Slide or lift transfers  - Unrestricted ROM Left knee Insicional and dressing care: Reinforce dressings as needed New dressing placed today.  Orthopedic device(s): None VTE prophylaxis: Lovenox Pain control: PRN pain medications, minimize narcotics as able Follow - up plan: 2 weeks in office with DR. Handy Dispo: PT/OT recommending SNF. TOC following.  Contact information: After hours and holidays please check [email or web address] for group call information for [facility]  Noemi Chapel, PA-C 03/27/2022

## 2022-03-27 NOTE — TOC Initial Note (Signed)
Transition of Care [facility]) - Initial/Assessment Note    Patient Details  Name: Toni [name] MRN: [id number] Date of Birth: 1933/03/20  [facility]) CM/SW Contact:    [address], [place] Phone Number: 03/27/2022, 12:20 PM  Clinical Narrative:                  CSW attempted to reach out to pt's daughter to discuss dc plans, no answer, HIPPA compliant vm left.         Patient Goals and CMS Choice        Expected Discharge Plan and Services                                                Prior Living Arrangements/Services                       Activities of Daily Living      Permission Sought/Granted                  Emotional Assessment              Admission diagnosis:  Femur fracture ([place]) [S72.90XA] Fall, initial encounter [W19.XXXA] Closed fracture of shaft of left femur, unspecified fracture morphology, initial encounter ([place]) [S72.302A] Patient Active Problem List   Diagnosis Date Noted   Stress fracture, left femur, initial encounter for fracture 03/24/2022   Femur fracture ([place]) 03/24/2022   Sepsis ([place])    Goals of care, counseling/discussion    Palliative care by specialist    Streptococcal sepsis ([place]) 09/02/2016   Streptococcal bacteremia 09/02/2016   E. coli UTI 33/29/5188   Acute metabolic encephalopathy 41/66/0630   Leukocytosis 09/02/2016   Community acquired pneumonia 08/31/2016   PCP:  Criss Rosales, Clinic Pharmacy:   [id number] [address], [place] [address] [address] Phone: [phone] Fax: [phone]     Social Determinants of Health (SDOH) Interventions    Readmission Risk Interventions     No data to display

## 2022-03-27 NOTE — TOC Progression Note (Signed)
Transition of Care [facility]) - Progression Note    Patient Details  Name: Toni [name] MRN: [id number] Date of Birth: 06/11/1933  Transition of Care [facility]) CM/SW [place], [place] Phone Number: 03/27/2022, 12:31 PM  Clinical Narrative:     CSW received phone call back from pt's daughter, she is agreement with SNF placement for pt. She states pt has previously been to [facility] a few years ago and would like her to return there if possible. CSW explained the fax out process and Medicare ratings information. No other concerns voiced.       Expected Discharge Plan and Services                                                 Social Determinants of Health (SDOH) Interventions    Readmission Risk Interventions     No data to display

## 2022-03-28 ENCOUNTER — Inpatient Hospital Stay (HOSPITAL_COMMUNITY): Payer: Medicare Other

## 2022-03-28 DIAGNOSIS — I5033 Acute on chronic diastolic (congestive) heart failure: Secondary | ICD-10-CM | POA: Diagnosis not present

## 2022-03-28 DIAGNOSIS — M84352A Stress fracture, left femur, initial encounter for fracture: Secondary | ICD-10-CM | POA: Diagnosis not present

## 2022-03-28 DIAGNOSIS — S7292XA Unspecified fracture of left femur, initial encounter for closed fracture: Secondary | ICD-10-CM | POA: Diagnosis not present

## 2022-03-28 LAB — CBC WITH DIFFERENTIAL/PLATELET
Abs Immature Granulocytes: 0.09 10*3/uL — ABNORMAL HIGH (ref 0.00–0.07)
Basophils Absolute: 0 10*3/uL (ref 0.0–0.1)
Basophils Relative: 0 %
Eosinophils Absolute: 0 10*3/uL (ref 0.0–0.5)
Eosinophils Relative: 0 %
HCT: 28.5 % — ABNORMAL LOW (ref 36.0–46.0)
Hemoglobin: 9.1 g/dL — ABNORMAL LOW (ref 12.0–15.0)
Immature Granulocytes: 1 %
Lymphocytes Relative: 29 %
Lymphs Abs: 2.7 10*3/uL (ref 0.7–4.0)
MCH: 28.9 pg (ref 26.0–34.0)
MCHC: 31.9 g/dL (ref 30.0–36.0)
MCV: 90.5 fL (ref 80.0–100.0)
Monocytes Absolute: 1.3 10*3/uL — ABNORMAL HIGH (ref 0.1–1.0)
Monocytes Relative: 14 %
Neutro Abs: 5 10*3/uL (ref 1.7–7.7)
Neutrophils Relative %: 56 %
Platelets: 202 10*3/uL (ref 150–400)
RBC: 3.15 MIL/uL — ABNORMAL LOW (ref 3.87–5.11)
RDW: 12.3 % (ref 11.5–15.5)
WBC: 9.2 10*3/uL (ref 4.0–10.5)
nRBC: 1.2 % — ABNORMAL HIGH (ref 0.0–0.2)

## 2022-03-28 LAB — COMPREHENSIVE METABOLIC PANEL
ALT: 10 U/L (ref 0–44)
AST: 19 U/L (ref 15–41)
Albumin: 2.6 g/dL — ABNORMAL LOW (ref 3.5–5.0)
Alkaline Phosphatase: 55 U/L (ref 38–126)
Anion gap: 10 (ref 5–15)
BUN: 21 mg/dL (ref 8–23)
CO2: 31 mmol/L (ref 22–32)
Calcium: 8 mg/dL — ABNORMAL LOW (ref 8.9–10.3)
Chloride: 93 mmol/L — ABNORMAL LOW (ref 98–111)
Creatinine, Ser: 0.67 mg/dL (ref 0.44–1.00)
GFR, Estimated: >60 mL/min (ref >60)
Glucose, Bld: 191 mg/dL — ABNORMAL HIGH (ref 70–99)
Potassium: 3.4 mmol/L — ABNORMAL LOW (ref 3.5–5.1)
Sodium: 134 mmol/L — ABNORMAL LOW (ref 135–145)
Total Bilirubin: 0.6 mg/dL (ref 0.3–1.2)
Total Protein: 5.5 g/dL — ABNORMAL LOW (ref 6.5–8.1)

## 2022-03-28 LAB — GLUCOSE, CAPILLARY
Glucose-Capillary: 136 mg/dL — ABNORMAL HIGH (ref 70–99)
Glucose-Capillary: 323 mg/dL — ABNORMAL HIGH (ref 70–99)
Glucose-Capillary: 217 mg/dL — ABNORMAL HIGH (ref 70–99)
Glucose-Capillary: 170 mg/dL — ABNORMAL HIGH (ref 70–99)

## 2022-03-28 LAB — MAGNESIUM: Magnesium: 2 mg/dL (ref 1.7–2.4)

## 2022-03-28 LAB — PHOSPHORUS: Phosphorus: 2.5 mg/dL (ref 2.5–4.6)

## 2022-03-28 MED ORDER — POTASSIUM CHLORIDE CRYS ER 20 MEQ PO TBCR
40.0000 meq | EXTENDED_RELEASE_TABLET | Freq: Two times a day (BID) | ORAL | Status: AC
Start: 2022-03-28 — End: 2022-03-28
  Administered 2022-03-28 (×2): 40 meq via ORAL
  Filled 2022-03-28 (×2): qty 2

## 2022-03-28 MED ORDER — HYDROCODONE-ACETAMINOPHEN 5-325 MG PO TABS: 1.0000 | ORAL_TABLET | Freq: Four times a day (QID) | ORAL | 0 refills | Status: DC | PRN

## 2022-03-28 MED ORDER — RIVAROXABAN 10 MG PO TABS: 10.0000 mg | ORAL_TABLET | Freq: Every day | ORAL | 0 refills | Status: AC

## 2022-03-28 MED ORDER — RIVAROXABAN 10 MG PO TABS
10.0000 mg | ORAL_TABLET | Freq: Every day | ORAL | Status: DC
  Administered 2022-03-29: 10 mg via ORAL
  Filled 2022-03-28: qty 1

## 2022-03-28 NOTE — Progress Notes (Signed)
PROGRESS NOTE    Toni [name]  FUX:[id number] DOB: 1933-02-24 DOA: 03/24/2022 PCP: Criss Rosales, Clinic   Brief Narrative:  HPI per Dr. Wynetta Fines on 03/24/22 HPI: Toni [name] is a 88 y.o. female with medical history significant of HTN, HLD, IDDM on diet control, morbid obesity, chronic HFpEF, severe pulmonary hypertension, knee OA status post left TKA in 2003, PVD, chronic ambulation dysfunction, presented with mechanical fall and left leg fracture.   Patient has chronic ambulation dysfunction, uses a walker to ambulate, and nighttime use of bedside commode.  Last night, patient woke up to use the commode but she rolled and lost balance and fell on the floor on left leg.  Denies any chest pain lightheadedness of prodromes.   At baseline, patient uses roller walker to ambulate because of chronic knee problems for more than 8 years.  She reported frequent shortness of breath and chronic productive cough " a lot of phlegms" as well as chronic bilateral lower extremity swelling.  She used to be on Lasix but stopped taking "for sometime", she used to be on insulin for her diabetes but at least this year, she has been only on diet control for unclear reasons.   **Interim History  She underwent surgical intervention by Dr. Ginette Pitman and today she is postoperative day 2.  Echocardiogram was done and she continues to be on diuretics but will stop and change to p.o. diuretics.  Orthopedic surgery recommending continuing the Foley catheter yesterday and will remove today.  PT OT recommending SNF.  The operation side started having some bleeding so is reinforced with Mepilex yesterday and again had her dressings reinforced today.   She is slowly improving and Foley was removed yesterday.  She is having bowel movements now.  Unfortunately she did not tolerate her Lovenox shot so this has been changed to p.o. Xarelto now for discharge.  PT OT still recommending SNF and awaiting placement.  Assessment and  Plan:  Acute left femur fracture status post ORIF of the left periprosthetic femur fracture with Biomet NCB plate and application stress under fluoroscopy to the left knee -Scheduled OR tomorrow, Dr. Roosevelt Locks discussed with orthopedic surgery PA, likely will be postponed due to recent block. -Pain control per orthopedic surgery along with DVT prophylaxis -We will need PT OT to further evaluate and treat for safe discharge disposition they are recommending SNF -She was taken to the OR by Dr. Marcelino Scot for an open reduction internal fixation of the left periprosthetic femur fracture with Biomet NCB plate and application of stress under fluoroscopy to the left knee -Continue with bowel regimen with bisacodyl 5 mg p.o. daily as needed for moderate constipation or, docusate 100 mg p.o. twice daily, senna docusate 1 tab p.o. twice daily as well as as needed; she had a very large bowel movement today so may need to back down on her bowel regimen -Continue with pain control with morphine 4 mg IV every 4 as needed and hydromorphone 0.5 mg IV every 2 as needed for severe pain and has hydrocodone-acetaminophen 1 tab p.o. every 6 as needed for moderate pain -Cardiology evaluated preoperatively and they feel that they cannot lower her cardiac risk with any further testing or intervention and are recommending avoiding excessive IV fluids to prevent volume overload and continue monitoring if perioperative complications -Vitamin D level was 15.39 -Further care per orthopedic surgery and they are recommending either Xarelto or Eliquis at discharge for DVT prophylaxis as she is currently on enoxaparin 55 mg subcu  q. 24 -Per orthopedic surgery they are recommending nonweightbearing on the left lower extremity and sliders left transfers and unrestricted range of motion of the left knee.  She had her dressings reviewed for today and had a new dressing placed. -Follow-up with TOC for placement   Acute on Chronic HFpEF  decompensation, improved -Significant fluid overload, peripheral edema and bilateral crackles, continued IV Lasix 40 mg IV twice daily but have now stopped given her improvement.  -BNP was elevated at 309 -Echocardiogram ordered and showed G1DD with EF of 65-70% -Echocardiogram 2019 showed significant abnormalities with significant severe pulmonary hypertension, and mild to moderate mitral regurgitation.  On physical exam she does have multiple murmurs and S3.  She has poor activity tolerance but significant exertional dyspnea with minimal activity at home.   -Cardiology consulted for further evaluation -Start beta-blocker -She will need strict I's and O's and daily weights; she is -250 mL since admission -Continue to monitor for signs and symptoms of volume overload -Foley catheter was in place was discontinued on 03/27/2022 given that we stopped her IV diuretics" we will resume oral diuretics in the a.m with Lasix 40 mg p.o. daily -DVT prophylaxis per orthopedic surgery now she is on enoxaparin 40 mg subcu every 24 and also on aspirin 81 mg p.o. daily but she was unable to tolerate the Lovenox injections so this is now been changed to Xarelto -Continuing carvedilol 3.125 mg p.o. twice daily but held given her lower blood pressure -Repeat chest x-ray today showed "Lungs are hypoinflated without focal airspace consolidation or effusion.  Cardiomediastinal silhouette and remainder of the exam is unchanged."   IDDM with Hyperglycemia -Not adherent with insulin regimen, reason no known -Start sliding scale and was on resistant scale but will cut down to the sensitive NovoLog sign scale -Continue monitor CBGs per protocol; CBGs ranging from 136-323   Pyuria and bacteriuria -Patient was having frequent urination but she is on Lasix; unclear why the Ortho team is worried this given that she is asymptomatic -Urinalysis showed a hazy appearance with amber-colored urine, moderate hemoglobin, moderate  leukocytes, rare bacteria, greater than 50 RBCs per high-power field and 21-50 WBCs with 0-5 squamous epithelial cells with a urine culture showing less than 10,000 colonies of insignificant growth -Currently do not have a clinical inclination to treat given that she has no symptoms   HTN -On Lasix as above but will stop and resume her home Lasix given that she appears closer to being euvolemic -hold off verapamil, start Coreg -Continue to monitor blood pressures per protocol -Last blood pressure reading is on the softer side and is now 99/65Pulmonary HTN -In decompensation with significant peripheral edema, IV Lasix given and now stopped and she is transition back to her p.o. Lasix -ECHO ordered and done and showed ". Left ventricular ejection fraction, by estimation, is 65 to 70%. The  left ventricle has normal function. The left ventricle has no regional wall motion abnormalities. There is mild concentric left ventricular hypertrophy. Left ventricular diastolic  parameters are consistent with Grade I diastolic dysfunction (impaired relaxation). Right ventricular systolic function is normal. The right ventricular size is normal. Left atrial size was mildly dilated. The mitral valve is normal in structure. No evidence of mitral valve regurgitation. Mild mitral stenosis. Severe mitral annular calcification. The aortic valve is tricuspid. Aortic valve regurgitation is trivial. No aortic stenosis is present."   Prolonged Qtc -Appears to be a chronic issue, no significant QTc changes compared to EKG in 2018. -Continue  Telemetry Monitoring    PVD -CAD equivalent -Considered to be mild on the right side of the leg with the ABI study in 2018 and normal ABI on left side in 2018. -Continue aspirin, outpatient follow-up with PCP.   Obesity -Complicates overall prognosis and care -Estimated body mass index is 46.61 kg/m as calculated from the following:   Height as of this encounter: '5\' 2"'$  (1.575  m).   Weight as of this encounter: 115.6 kg.  -Weight Loss and Dietary Counseling given  DVT prophylaxis: rivaroxaban (XARELTO) tablet 10 mg Start: 03/29/22 1000 SCDs Start: 03/25/22 1705 rivaroxaban (XARELTO) tablet 10 mg    Code Status: Full Code Family Communication: No family currently at bedside  Disposition Plan:  Level of care: Telemetry Medical Status is: Inpatient Remains inpatient appropriate because: Still awaiting TOC assistance with placement.  He is improving and we removed Foley catheter.  She had a very large bowel movement today.  Orthopedic surgery has changed her to p.o. Xarelto now   Consultants:  Cardiology Orthopedic Surgery  Procedures:  ECHOCARDIOGRAM IMPRESSIONS    1. Left ventricular ejection fraction, by estimation, is 65 to 70%. The  left ventricle has normal function. The left ventricle has no regional  wall motion abnormalities. There is mild concentric left ventricular  hypertrophy. Left ventricular diastolic  parameters are consistent with Grade I diastolic dysfunction (impaired  relaxation).   2. Right ventricular systolic function is normal. The right ventricular  size is normal.   3. Left atrial size was mildly dilated.   4. The mitral valve is normal in structure. No evidence of mitral valve  regurgitation. Mild mitral stenosis. Severe mitral annular calcification.   5. The aortic valve is tricuspid. Aortic valve regurgitation is trivial.  No aortic stenosis is present.   FINDINGS   Left Ventricle: Left ventricular ejection fraction, by estimation, is 65  to 70%. The left ventricle has normal function. The left ventricle has no  regional wall motion abnormalities. The left ventricular internal cavity  size was normal in size. There is   mild concentric left ventricular hypertrophy. Left ventricular diastolic  parameters are consistent with Grade I diastolic dysfunction (impaired  relaxation).   Right Ventricle: The right ventricular  size is normal. Right ventricular  systolic function is normal.   Left Atrium: Left atrial size was mildly dilated.   Right Atrium: Right atrial size was normal in size.   Pericardium: There is no evidence of pericardial effusion.   Mitral Valve: The mitral valve is normal in structure. Severe mitral  annular calcification. No evidence of mitral valve regurgitation. Mild  mitral valve stenosis. MV peak gradient, 12.5 mmHg. The mean mitral valve  gradient is 7.0 mmHg.   Tricuspid Valve: The tricuspid valve is normal in structure. Tricuspid  valve regurgitation is mild . No evidence of tricuspid stenosis.   Aortic Valve: The aortic valve is tricuspid. Aortic valve regurgitation is  trivial. Aortic regurgitation PHT measures 450 msec. No aortic stenosis is  present. Aortic valve mean gradient measures 5.0 mmHg. Aortic valve peak  gradient measures 10.1 mmHg.  Aortic valve area, by VTI measures 2.42 cm.   Pulmonic Valve: The pulmonic valve was not well visualized. Pulmonic valve  regurgitation is not visualized. No evidence of pulmonic stenosis.   Aorta: The aortic root is normal in size and structure.   Venous: The inferior vena cava was not well visualized.   IAS/Shunts: The interatrial septum was not well visualized.      LEFT  VENTRICLE  PLAX 2D  LVIDd:         5.00 cm   Diastology  LVIDs:         3.30 cm   LV e' lateral:   6.53 cm/s  LV PW:         1.20 cm   LV E/e' lateral: 14.6  LV IVS:        1.20 cm  LVOT diam:     2.10 cm  LV SV:         69  LV SV Index:   33  LVOT Area:     3.46 cm      RIGHT VENTRICLE  TAPSE (M-mode): 2.1 cm   LEFT ATRIUM             Index        RIGHT ATRIUM           Index  LA Vol (A2C):   74.0 ml 34.92 ml/m  RA Area:     12.00 cm  LA Vol (A4C):   75.0 ml 35.39 ml/m  RA Volume:   22.90 ml  10.81 ml/m  LA Biplane Vol: 76.0 ml 35.86 ml/m   AORTIC VALVE  AV Area (Vmax):    2.31 cm  AV Area (Vmean):   2.26 cm  AV Area (VTI):      2.42 cm  AV Vmax:           159.00 cm/s  AV Vmean:          106.000 cm/s  AV VTI:            0.285 m  AV Peak Grad:      10.1 mmHg  AV Mean Grad:      5.0 mmHg  LVOT Vmax:         106.00 cm/s  LVOT Vmean:        69.200 cm/s  LVOT VTI:          0.199 m  LVOT/AV VTI ratio: 0.70  AI PHT:            450 msec     AORTA  Ao Root diam: 3.10 cm   MITRAL VALVE                TRICUSPID VALVE  MV Area (PHT): 3.37 cm     TR Peak grad:   43.8 mmHg  MV Area VTI:   1.63 cm     TR Vmax:        331.00 cm/s  MV Peak grad:  12.5 mmHg  MV Mean grad:  7.0 mmHg     SHUNTS  MV Vmax:       1.77 m/s     Systemic VTI:  0.20 m  MV Vmean:      121.5 cm/s   Systemic Diam: 2.10 cm  MV Decel Time: 225 msec  MV E velocity: 95.40 cm/s  MV A velocity: 173.00 cm/s  MV E/A ratio:  0.55      1. OPEN REDUCTION INTERNAL FIXATION OF LEFT PERIPROSTHETIC FEMUR FRACTURE WITH BIOMET NCB PLATE 2. APPLICATION OF STRESS UNDER FLUOROSCOPY TO THE LEFT KNEE  Antimicrobials:  Anti-infectives (From admission, onward)    Start     Dose/Rate Route Frequency Ordered Stop   03/25/22 1800  ceFAZolin (ANCEF) IVPB 2g/100 mL premix        2 g 200 mL/hr over 30 Minutes Intravenous Every 6 hours 03/25/22 1705 03/26/22 1619   03/25/22 1424  vancomycin (VANCOCIN) powder  Status:  Discontinued          As needed 03/25/22 1424 03/25/22 1507   03/25/22 1000  ceFAZolin (ANCEF) IVPB 2g/100 mL premix        2 g 200 mL/hr over 30 Minutes Intravenous To [facility]Stay [facility] 03/25/22 0704 03/25/22 1200   03/24/22 1200  cefTRIAXone (ROCEPHIN) 1 g in sodium chloride 0.9 % 100 mL IVPB  Status:  Discontinued        1 g 200 mL/hr over 30 Minutes Intravenous  Once 03/24/22 1149 03/24/22 1220   03/24/22 1200  azithromycin (ZITHROMAX) 500 mg in sodium chloride 0.9 % 250 mL IVPB  Status:  Discontinued        500 mg 250 mL/hr over 60 Minutes Intravenous  Once 03/24/22 1149 03/24/22 1220       Subjective: Seen and examined at bedside and she is  overall comfortable as she states that she just had a bowel movement complains that she did not have a chance of the nursing and time.  She states that she feels better after having a bowel movement.  No nausea or vomiting.  Denies chest pain.  No other concerns or complaints this time.  Objective: Vitals:   03/28/22 1400 03/28/22 1447 03/28/22 1500 03/28/22 1600  BP:  99/65    Pulse: 99 95 94 90  Resp: '20 20 20 20  '$ Temp:  98.7 F (37.1 C)    TempSrc:  Oral    SpO2: 94% 98% 95% 93%  Weight:      Height:        Intake/Output Summary (Last 24 hours) at 03/28/2022 1604 Last data filed at 03/28/2022 1300 Gross per 24 hour  Intake 680 ml  Output 250 ml  Net 430 ml   Filed Weights   03/24/22 1149 03/25/22 1518  Weight: 95.3 kg 115.6 kg   Examination: Physical Exam:  Constitutional: WN/WD morbidly obese African-American female currently no acute distress but appears little comfortable Respiratory: Diminished to auscultation bilaterally with coarse breath sounds, no wheezing, rales, rhonchi or crackles. Normal respiratory effort and patient is not tachypenic. No accessory muscle use.  Unlabored breathing Cardiovascular: RRR, no murmurs / rubs / gallops. S1 and S2 auscultated.  Has some mild lower extremity edema Abdomen: Soft, non-tender, distant secondary body habitus. Bowel sounds positive.  GU: Deferred. Musculoskeletal: No clubbing / cyanosis of digits/nails. No joint deformity upper and lower extremities.  Skin: No rashes, lesions, ulcers on limited skin evaluation. No induration; Warm and dry.  Neurologic: CN 2-12 grossly intact with no focal deficits. Romberg sign and cerebellar reflexes not assessed.  Psychiatric: Normal judgment and insight. Alert and oriented x 3.  Anxious mood and appropriate affect.   Data Reviewed: I have personally reviewed following labs and imaging studies  CBC: Recent Labs  Lab 03/24/22 1400 03/25/22 0235 03/26/22 0103 03/27/22 0104  03/28/22 0045  WBC 6.0 6.1 9.9 8.6 9.2  NEUTROABS 3.9  --   --  5.1 5.0  HGB 12.0 11.1* 9.4* 9.0* 9.1*  HCT 38.2 36.4 29.1* 27.5* 28.5*  MCV 92.3 94.1 90.1 89.3 90.5  PLT 165 162 153 176 030   Basic Metabolic Panel: Recent Labs  Lab 03/24/22 1400 03/24/22 1828 03/26/22 0103 03/27/22 0104 03/28/22 0045  NA 140 140 137 134* 134*  K 4.4 4.5 4.2 3.6 3.4*  CL 102 103 100 94* 93*  CO2 '28 27 26 30 31  '$ GLUCOSE 205* 163* 238* 180* 191*  BUN 13  $'13 18 22 21  'G$ CREATININE 0.64 0.61 0.78 0.74 0.67  CALCIUM 9.0 8.8* 8.3* 8.4* 8.0*  MG  --  1.9  --  1.8 2.0  PHOS  --  3.3  --  3.0 2.5   GFR: Estimated Creatinine Clearance: 59.7 mL/min (by C-G formula based on SCr of 0.67 mg/dL). Liver Function Tests: Recent Labs  Lab 03/24/22 1828 03/27/22 0104 03/28/22 0045  AST '20 18 19  '$ ALT '13 12 10  '$ ALKPHOS 68 51 55  BILITOT 0.6 0.9 0.6  PROT 6.0* 5.6* 5.5*  ALBUMIN 2.9* 2.7* 2.6*   No results for input(s): "LIPASE", "AMYLASE" in the last 168 hours. No results for input(s): "AMMONIA" in the last 168 hours. Coagulation Profile: Recent Labs  Lab 03/24/22 1400  INR 1.1   Cardiac Enzymes: No results for input(s): "CKTOTAL", "CKMB", "CKMBINDEX", "TROPONINI" in the last 168 hours. BNP (last 3 results) No results for input(s): "PROBNP" in the last 8760 hours. HbA1C: No results for input(s): "HGBA1C" in the last 72 hours. CBG: Recent Labs  Lab 03/27/22 1658 03/27/22 2017 03/28/22 0816 03/28/22 1157 03/28/22 1603  GLUCAP 244* 214* 136* 323* 217*   Lipid Profile: No results for input(s): "CHOL", "HDL", "LDLCALC", "TRIG", "CHOLHDL", "LDLDIRECT" in the last 72 hours. Thyroid Function Tests: No results for input(s): "TSH", "T4TOTAL", "FREET4", "T3FREE", "THYROIDAB" in the last 72 hours. Anemia Panel: Recent Labs    03/27/22 0104  VITAMINB12 1,480*  FOLATE 13.8  FERRITIN 80  TIBC 249*  IRON 40  RETICCTPCT 3.1   Sepsis Labs: No results for input(s): "PROCALCITON", "LATICACIDVEN"  in the last 168 hours.  Recent Results (from the past 240 hour(s))  Urine Culture     Status: Abnormal   Collection Time: 03/26/22 12:54 PM   Specimen: Urine, Catheterized  Result Value Ref Range Status   Specimen Description URINE, CATHETERIZED  Final   Special Requests NONE  Final   Culture (A)  Final    <10,000 COLONIES/mL INSIGNIFICANT GROWTH Performed at [facility], [address], [place], [place] [address]    Report Status 03/27/2022 FINAL  Final     Radiology Studies: DG CHEST PORT 1 VIEW  Result Date: 03/28/2022 CLINICAL DATA:  Shortness of breath. EXAM: PORTABLE CHEST 1 VIEW COMPARISON:  03/26/2022 FINDINGS: Lungs are hypoinflated without focal airspace consolidation or effusion. Cardiomediastinal silhouette and remainder of the exam is unchanged. IMPRESSION: Hypoinflation without acute cardiopulmonary disease. Electronically Signed   By: Marin Olp M.D.   On: 03/28/2022 08:00   DG CHEST PORT 1 VIEW  Result Date: 03/26/2022 CLINICAL DATA:  Shortness of breath EXAM: PORTABLE CHEST 1 VIEW COMPARISON:  Chest x-ray 03/24/2022 FINDINGS: The heart size and mediastinal contours are within normal limits. Both lungs are clear. No acute fractures. Degenerative changes affect the spine and shoulders. IMPRESSION: No active disease. Electronically Signed   By: Ronney Asters M.D.   On: 03/26/2022 19:25    Scheduled Meds:  aspirin  81 mg Oral QPM   carvedilol  3.125 mg Oral BID WC   cholecalciferol  2,000 Units Oral BID   docusate sodium  100 mg Oral BID   feeding supplement  237 mL Oral BID BM   fentaNYL (SUBLIMAZE) injection  50 mcg Intravenous Once   furosemide  40 mg Oral Daily   insulin aspart  0-9 Units Subcutaneous TID AC & HS   multivitamin with minerals  1 tablet Oral Daily   potassium chloride  40 mEq Oral BID   [START  ON 03/29/2022] rivaroxaban  10 mg Oral Daily   senna-docusate  1 tablet Oral BID   simvastatin  40 mg Oral Daily   Vitamin D (Ergocalciferol)   50,000 Units Oral Q7 days   Continuous Infusions:   LOS: 4 days   Raiford Noble, DO [facility] Available via Epic secure chat 7am-7pm After these hours, please refer to coverage provider listed on [email or web address] 03/28/2022, 4:04 PM

## 2022-03-28 NOTE — Progress Notes (Addendum)
   ORTHOPAEDIC PROGRESS NOTE  s/p Procedure(s): OPEN REDUCTION INTERNAL FIXATION LEFT FEMUR on 03/25/2022 with Dr. Marcelino Scot  SUBJECTIVE: Reports moderate pain about operative site. This is improved compared to yesterday. Tolerating diet. No chest pain. No SOB. No nausea/vomiting. No other complaints.   OBJECTIVE: PE: LLE: dressing with some drainage noted, new dressing placed, incision CDI, distal motor and sensory function grossly intact, warm well perfused extremity   Vitals:   03/27/22 2019 03/28/22 0440  BP: (!) 82/68 101/61  Pulse: 92 89  Resp: 16 18  Temp: 98.8 F (37.1 C) 98.3 F (36.8 C)  SpO2: 90% 91%     ASSESSMENT: Toni [name] is a 88 y.o. female POD#3  PLAN: Weightbearing: NWB LLE  - Slide or lift transfers  - Unrestricted ROM Left knee Insicional and dressing care: Reinforce dressings as needed New dressing placed today.  Orthopedic device(s): None VTE prophylaxis: Lovenox - patient has not been tolerating Lovenox injections. Will switch to oral Xarelto today.  Pain control: PRN pain medications, minimize narcotics as able Follow - up plan: 2 weeks in office with DR. Handy Dispo: PT/OT recommending SNF. TOC following.  Contact information: After hours and holidays please check [email or web address] for group call information for [facility]  Noemi Chapel, PA-C 03/28/2022

## 2022-03-29 DIAGNOSIS — Z7984 Long term (current) use of oral hypoglycemic drugs: Secondary | ICD-10-CM | POA: Diagnosis not present

## 2022-03-29 DIAGNOSIS — R8271 Bacteriuria: Secondary | ICD-10-CM | POA: Diagnosis not present

## 2022-03-29 DIAGNOSIS — R059 Cough, unspecified: Secondary | ICD-10-CM | POA: Diagnosis not present

## 2022-03-29 DIAGNOSIS — R319 Hematuria, unspecified: Secondary | ICD-10-CM | POA: Diagnosis present

## 2022-03-29 DIAGNOSIS — I959 Hypotension, unspecified: Secondary | ICD-10-CM | POA: Diagnosis not present

## 2022-03-29 DIAGNOSIS — N3000 Acute cystitis without hematuria: Secondary | ICD-10-CM | POA: Diagnosis not present

## 2022-03-29 DIAGNOSIS — K5900 Constipation, unspecified: Secondary | ICD-10-CM | POA: Diagnosis not present

## 2022-03-29 DIAGNOSIS — N3946 Mixed incontinence: Secondary | ICD-10-CM | POA: Diagnosis not present

## 2022-03-29 DIAGNOSIS — D649 Anemia, unspecified: Secondary | ICD-10-CM | POA: Diagnosis not present

## 2022-03-29 DIAGNOSIS — I5033 Acute on chronic diastolic (congestive) heart failure: Secondary | ICD-10-CM

## 2022-03-29 DIAGNOSIS — M6281 Muscle weakness (generalized): Secondary | ICD-10-CM | POA: Diagnosis not present

## 2022-03-29 DIAGNOSIS — R310 Gross hematuria: Secondary | ICD-10-CM | POA: Diagnosis not present

## 2022-03-29 DIAGNOSIS — R300 Dysuria: Secondary | ICD-10-CM | POA: Diagnosis not present

## 2022-03-29 DIAGNOSIS — L84 Corns and callosities: Secondary | ICD-10-CM | POA: Diagnosis not present

## 2022-03-29 DIAGNOSIS — I739 Peripheral vascular disease, unspecified: Secondary | ICD-10-CM | POA: Diagnosis not present

## 2022-03-29 DIAGNOSIS — Z7982 Long term (current) use of aspirin: Secondary | ICD-10-CM | POA: Diagnosis not present

## 2022-03-29 DIAGNOSIS — Z0000 Encounter for general adult medical examination without abnormal findings: Secondary | ICD-10-CM | POA: Diagnosis not present

## 2022-03-29 DIAGNOSIS — S79929A Unspecified injury of unspecified thigh, initial encounter: Secondary | ICD-10-CM | POA: Diagnosis not present

## 2022-03-29 DIAGNOSIS — M1712 Unilateral primary osteoarthritis, left knee: Secondary | ICD-10-CM | POA: Diagnosis not present

## 2022-03-29 DIAGNOSIS — M898X5 Other specified disorders of bone, thigh: Secondary | ICD-10-CM | POA: Diagnosis not present

## 2022-03-29 DIAGNOSIS — E871 Hypo-osmolality and hyponatremia: Secondary | ICD-10-CM | POA: Diagnosis not present

## 2022-03-29 DIAGNOSIS — M9712XD Periprosthetic fracture around internal prosthetic left knee joint, subsequent encounter: Secondary | ICD-10-CM | POA: Diagnosis not present

## 2022-03-29 DIAGNOSIS — I10 Essential (primary) hypertension: Secondary | ICD-10-CM | POA: Diagnosis not present

## 2022-03-29 DIAGNOSIS — D6489 Other specified anemias: Secondary | ICD-10-CM | POA: Diagnosis not present

## 2022-03-29 DIAGNOSIS — R9431 Abnormal electrocardiogram [ECG] [EKG]: Secondary | ICD-10-CM

## 2022-03-29 DIAGNOSIS — Z8616 Personal history of COVID-19: Secondary | ICD-10-CM | POA: Diagnosis not present

## 2022-03-29 DIAGNOSIS — K5909 Other constipation: Secondary | ICD-10-CM | POA: Diagnosis not present

## 2022-03-29 DIAGNOSIS — I5032 Chronic diastolic (congestive) heart failure: Secondary | ICD-10-CM | POA: Diagnosis not present

## 2022-03-29 DIAGNOSIS — M2012 Hallux valgus (acquired), left foot: Secondary | ICD-10-CM | POA: Diagnosis not present

## 2022-03-29 DIAGNOSIS — S72392S Other fracture of shaft of left femur, sequela: Secondary | ICD-10-CM | POA: Diagnosis not present

## 2022-03-29 DIAGNOSIS — S7292XD Unspecified fracture of left femur, subsequent encounter for closed fracture with routine healing: Secondary | ICD-10-CM | POA: Diagnosis not present

## 2022-03-29 DIAGNOSIS — E1165 Type 2 diabetes mellitus with hyperglycemia: Secondary | ICD-10-CM | POA: Diagnosis not present

## 2022-03-29 DIAGNOSIS — M2011 Hallux valgus (acquired), right foot: Secondary | ICD-10-CM | POA: Diagnosis not present

## 2022-03-29 DIAGNOSIS — B351 Tinea unguium: Secondary | ICD-10-CM | POA: Diagnosis not present

## 2022-03-29 DIAGNOSIS — I270 Primary pulmonary hypertension: Secondary | ICD-10-CM | POA: Diagnosis not present

## 2022-03-29 DIAGNOSIS — R296 Repeated falls: Secondary | ICD-10-CM | POA: Diagnosis not present

## 2022-03-29 DIAGNOSIS — L818 Other specified disorders of pigmentation: Secondary | ICD-10-CM | POA: Diagnosis not present

## 2022-03-29 DIAGNOSIS — Z7901 Long term (current) use of anticoagulants: Secondary | ICD-10-CM | POA: Diagnosis not present

## 2022-03-29 DIAGNOSIS — R278 Other lack of coordination: Secondary | ICD-10-CM | POA: Diagnosis not present

## 2022-03-29 DIAGNOSIS — R41841 Cognitive communication deficit: Secondary | ICD-10-CM | POA: Diagnosis not present

## 2022-03-29 DIAGNOSIS — I272 Pulmonary hypertension, unspecified: Secondary | ICD-10-CM | POA: Diagnosis not present

## 2022-03-29 DIAGNOSIS — E119 Type 2 diabetes mellitus without complications: Secondary | ICD-10-CM | POA: Diagnosis not present

## 2022-03-29 DIAGNOSIS — Z79899 Other long term (current) drug therapy: Secondary | ICD-10-CM | POA: Diagnosis not present

## 2022-03-29 DIAGNOSIS — R2689 Other abnormalities of gait and mobility: Secondary | ICD-10-CM | POA: Diagnosis not present

## 2022-03-29 DIAGNOSIS — K5730 Diverticulosis of large intestine without perforation or abscess without bleeding: Secondary | ICD-10-CM | POA: Diagnosis not present

## 2022-03-29 DIAGNOSIS — R8281 Pyuria: Secondary | ICD-10-CM | POA: Diagnosis not present

## 2022-03-29 DIAGNOSIS — R051 Acute cough: Secondary | ICD-10-CM | POA: Diagnosis not present

## 2022-03-29 DIAGNOSIS — E118 Type 2 diabetes mellitus with unspecified complications: Secondary | ICD-10-CM | POA: Diagnosis not present

## 2022-03-29 DIAGNOSIS — S7292XA Unspecified fracture of left femur, initial encounter for closed fracture: Secondary | ICD-10-CM | POA: Diagnosis not present

## 2022-03-29 DIAGNOSIS — K7460 Unspecified cirrhosis of liver: Secondary | ICD-10-CM | POA: Diagnosis not present

## 2022-03-29 DIAGNOSIS — K7689 Other specified diseases of liver: Secondary | ICD-10-CM | POA: Diagnosis not present

## 2022-03-29 DIAGNOSIS — N3289 Other specified disorders of bladder: Secondary | ICD-10-CM | POA: Diagnosis not present

## 2022-03-29 DIAGNOSIS — M84352A Stress fracture, left femur, initial encounter for fracture: Secondary | ICD-10-CM | POA: Diagnosis not present

## 2022-03-29 DIAGNOSIS — D638 Anemia in other chronic diseases classified elsewhere: Secondary | ICD-10-CM | POA: Diagnosis not present

## 2022-03-29 DIAGNOSIS — R0689 Other abnormalities of breathing: Secondary | ICD-10-CM | POA: Diagnosis not present

## 2022-03-29 DIAGNOSIS — M5459 Other low back pain: Secondary | ICD-10-CM | POA: Diagnosis not present

## 2022-03-29 DIAGNOSIS — E785 Hyperlipidemia, unspecified: Secondary | ICD-10-CM | POA: Diagnosis not present

## 2022-03-29 DIAGNOSIS — S72001A Fracture of unspecified part of neck of right femur, initial encounter for closed fracture: Secondary | ICD-10-CM | POA: Diagnosis not present

## 2022-03-29 DIAGNOSIS — Z9181 History of falling: Secondary | ICD-10-CM | POA: Diagnosis not present

## 2022-03-29 DIAGNOSIS — L988 Other specified disorders of the skin and subcutaneous tissue: Secondary | ICD-10-CM | POA: Diagnosis not present

## 2022-03-29 DIAGNOSIS — Z794 Long term (current) use of insulin: Secondary | ICD-10-CM

## 2022-03-29 DIAGNOSIS — R531 Weakness: Secondary | ICD-10-CM | POA: Diagnosis not present

## 2022-03-29 DIAGNOSIS — N281 Cyst of kidney, acquired: Secondary | ICD-10-CM | POA: Diagnosis not present

## 2022-03-29 DIAGNOSIS — I110 Hypertensive heart disease with heart failure: Secondary | ICD-10-CM | POA: Diagnosis not present

## 2022-03-29 DIAGNOSIS — N390 Urinary tract infection, site not specified: Secondary | ICD-10-CM | POA: Diagnosis not present

## 2022-03-29 DIAGNOSIS — R410 Disorientation, unspecified: Secondary | ICD-10-CM | POA: Diagnosis not present

## 2022-03-29 DIAGNOSIS — Z7401 Bed confinement status: Secondary | ICD-10-CM | POA: Diagnosis not present

## 2022-03-29 DIAGNOSIS — S72302D Unspecified fracture of shaft of left femur, subsequent encounter for closed fracture with routine healing: Secondary | ICD-10-CM | POA: Diagnosis not present

## 2022-03-29 DIAGNOSIS — I509 Heart failure, unspecified: Secondary | ICD-10-CM | POA: Diagnosis not present

## 2022-03-29 DIAGNOSIS — R58 Hemorrhage, not elsewhere classified: Secondary | ICD-10-CM | POA: Diagnosis not present

## 2022-03-29 DIAGNOSIS — T68XXXA Hypothermia, initial encounter: Secondary | ICD-10-CM | POA: Diagnosis not present

## 2022-03-29 DIAGNOSIS — Z23 Encounter for immunization: Secondary | ICD-10-CM | POA: Diagnosis not present

## 2022-03-29 DIAGNOSIS — N938 Other specified abnormal uterine and vaginal bleeding: Secondary | ICD-10-CM | POA: Diagnosis not present

## 2022-03-29 DIAGNOSIS — Z9889 Other specified postprocedural states: Secondary | ICD-10-CM | POA: Diagnosis not present

## 2022-03-29 LAB — COMPREHENSIVE METABOLIC PANEL
ALT: 12 U/L (ref 0–44)
AST: 20 U/L (ref 15–41)
Albumin: 2.6 g/dL — ABNORMAL LOW (ref 3.5–5.0)
Alkaline Phosphatase: 57 U/L (ref 38–126)
Anion gap: 9 (ref 5–15)
BUN: 19 mg/dL (ref 8–23)
CO2: 33 mmol/L — ABNORMAL HIGH (ref 22–32)
Calcium: 8.6 mg/dL — ABNORMAL LOW (ref 8.9–10.3)
Chloride: 94 mmol/L — ABNORMAL LOW (ref 98–111)
Creatinine, Ser: 0.59 mg/dL (ref 0.44–1.00)
GFR, Estimated: >60 mL/min (ref >60)
Glucose, Bld: 164 mg/dL — ABNORMAL HIGH (ref 70–99)
Potassium: 5.1 mmol/L (ref 3.5–5.1)
Sodium: 136 mmol/L (ref 135–145)
Total Bilirubin: 1 mg/dL (ref 0.3–1.2)
Total Protein: 5.7 g/dL — ABNORMAL LOW (ref 6.5–8.1)

## 2022-03-29 LAB — CBC WITH DIFFERENTIAL/PLATELET
Abs Immature Granulocytes: 0.13 10*3/uL — ABNORMAL HIGH (ref 0.00–0.07)
Basophils Absolute: 0.1 10*3/uL (ref 0.0–0.1)
Basophils Relative: 1 %
Eosinophils Absolute: 0.2 10*3/uL (ref 0.0–0.5)
Eosinophils Relative: 2 %
HCT: 28.2 % — ABNORMAL LOW (ref 36.0–46.0)
Hemoglobin: 9.2 g/dL — ABNORMAL LOW (ref 12.0–15.0)
Immature Granulocytes: 1 %
Lymphocytes Relative: 29 %
Lymphs Abs: 3 10*3/uL (ref 0.7–4.0)
MCH: 29.9 pg (ref 26.0–34.0)
MCHC: 32.6 g/dL (ref 30.0–36.0)
MCV: 91.6 fL (ref 80.0–100.0)
Monocytes Absolute: 1.3 10*3/uL — ABNORMAL HIGH (ref 0.1–1.0)
Monocytes Relative: 13 %
Neutro Abs: 5.4 10*3/uL (ref 1.7–7.7)
Neutrophils Relative %: 54 %
Platelets: 228 10*3/uL (ref 150–400)
RBC: 3.08 MIL/uL — ABNORMAL LOW (ref 3.87–5.11)
RDW: 12.5 % (ref 11.5–15.5)
WBC: 10 10*3/uL (ref 4.0–10.5)
nRBC: 2.5 % — ABNORMAL HIGH (ref 0.0–0.2)

## 2022-03-29 LAB — GLUCOSE, CAPILLARY
Glucose-Capillary: 185 mg/dL — ABNORMAL HIGH (ref 70–99)
Glucose-Capillary: 146 mg/dL — ABNORMAL HIGH (ref 70–99)
Glucose-Capillary: 152 mg/dL — ABNORMAL HIGH (ref 70–99)

## 2022-03-29 LAB — PHOSPHORUS: Phosphorus: 2.6 mg/dL (ref 2.5–4.6)

## 2022-03-29 LAB — MAGNESIUM: Magnesium: 2 mg/dL (ref 1.7–2.4)

## 2022-03-29 MED ORDER — ENSURE ENLIVE PO LIQD: 237.0000 mL | Freq: Two times a day (BID) | ORAL | 12 refills | Status: DC

## 2022-03-29 MED ORDER — CARVEDILOL 3.125 MG PO TABS: 3.1250 mg | ORAL_TABLET | Freq: Two times a day (BID) | ORAL | Status: DC

## 2022-03-29 MED ORDER — VITAMIN D (ERGOCALCIFEROL) 1.25 MG (50000 UNIT) PO CAPS: 50000.0000 [IU] | ORAL_CAPSULE | ORAL | 0 refills | Status: DC

## 2022-03-29 MED ORDER — BISACODYL 5 MG PO TBEC: 5.0000 mg | DELAYED_RELEASE_TABLET | Freq: Every day | ORAL | 0 refills | Status: DC | PRN

## 2022-03-29 MED ORDER — VITAMIN D3 25 MCG PO TABS: 2000.0000 [IU] | ORAL_TABLET | Freq: Two times a day (BID) | ORAL | Status: DC

## 2022-03-29 MED ORDER — SENNOSIDES-DOCUSATE SODIUM 8.6-50 MG PO TABS: 1.0000 | ORAL_TABLET | Freq: Two times a day (BID) | ORAL | Status: DC

## 2022-03-29 MED ORDER — ONDANSETRON HCL 4 MG PO TABS: 4.0000 mg | ORAL_TABLET | Freq: Four times a day (QID) | ORAL | 0 refills | Status: DC | PRN

## 2022-03-29 NOTE — Discharge Summary (Signed)
Physician Discharge Summary   Patient: Toni [name] MRN: [id number] DOB: 08-30-1932  Admit date:     03/24/2022  Discharge date: 03/29/22  Discharge Physician: Raiford Noble, DO   PCP: [facility]   Recommendations at discharge:   Follow-up with PCP within 1 to 2 weeks and repeat CBC, CMP, mag, Phos within 1 week With cardiology in outpatient setting within 1 to 2 weeks with close follow-up Follow-up with Orthopedic Surgery in 1 to 2 weeks  Discharge Diagnoses: Principal Problem:   Femur fracture ([place]) Active Problems:   Stress fracture, left femur, initial encounter for fracture   Acute on chronic diastolic CHF (congestive heart failure) (HCC)   Type 2 diabetes mellitus with hyperglycemia, with long-term current use of insulin (HCC)   Pyuria   Bacteriuria   Essential hypertension   Prolonged QT interval   PVD (peripheral vascular disease) (HCC)   Obesity, Class III, BMI 40-49.9 (morbid obesity) ([place])   Pulmonary hypertension (HCC)   Hyponatremia   Normocytic anemia  Resolved Problems:   * No resolved hospital problems. [facility] Course: HPI per Dr. Wynetta Fines on 03/24/22 HPI: Toni [name] is a 88 y.o. female with medical history significant of HTN, HLD, IDDM on diet control, morbid obesity, chronic HFpEF, severe pulmonary hypertension, knee OA status post left TKA in 2003, PVD, chronic ambulation dysfunction, presented with mechanical fall and left leg fracture.   Patient has chronic ambulation dysfunction, uses a walker to ambulate, and nighttime use of bedside commode.  Last night, patient woke up to use the commode but she rolled and lost balance and fell on the floor on left leg.  Denies any chest pain lightheadedness of prodromes.   At baseline, patient uses roller walker to ambulate because of chronic knee problems for more than 8 years.  She reported frequent shortness of breath and chronic productive cough " a lot of phlegms" as well as chronic bilateral lower  extremity swelling.  She used to be on Lasix but stopped taking "for sometime", she used to be on insulin for her diabetes but at least this year, she has been only on diet control for unclear reasons.   **Interim History  She underwent surgical intervention by Dr. Ginette Pitman and today she is postoperative day 2.  Echocardiogram was done and she continues to be on diuretics but will stop and change to p.o. diuretics.  Orthopedic surgery recommending continuing the Foley catheter yesterday and will remove today.  PT OT recommending SNF.  The operation side started having some bleeding so is reinforced with Mepilex yesterday and again had her dressings reinforced today.   She is slowly improving and Foley was removed the day before yesterday.  She is having bowel movements now. Unfortunately she did not tolerate her Lovenox shot so this has been changed to p.o. Xarelto now for discharge.  PT OT still recommending SNF and she is improved and is stable for discharge to the facility  Assessment and Plan:  Acute left femur fracture status post ORIF of the left periprosthetic femur fracture with Biomet NCB plate and application stress under fluoroscopy to the left knee -Pain control per orthopedic surgery along with DVT prophylaxis -We will need PT OT to further evaluate and treat for safe discharge disposition they are recommending SNF -She was taken to the OR by Dr. Marcelino Scot for an open reduction internal fixation of the left periprosthetic femur fracture with Biomet NCB plate and application of stress under fluoroscopy to the left  knee -Continue with bowel regimen with bisacodyl 5 mg p.o. daily as needed for moderate constipation or, docusate 100 mg p.o. twice daily, senna docusate 1 tab p.o. twice daily as well as as needed; she had a very large bowel movement today so may need to back down on her bowel regimen -Continue with pain control with morphine 4 mg IV every 4 as needed and hydromorphone 0.5 mg IV every 2  as needed for severe pain and has hydrocodone-acetaminophen 1 tab p.o. every 6 as needed for moderate pain -Cardiology evaluated preoperatively and they feel that they cannot lower her cardiac risk with any further testing or intervention and are recommending avoiding excessive IV fluids to prevent volume overload and continue monitoring if perioperative complications -Vitamin D level was 15.39 -Further care per orthopedic surgery and they are recommending either Xarelto  -Per orthopedic surgery they are recommending nonweightbearing on the left lower extremity and sliders left transfers and unrestricted range of motion of the left knee.  She had her dressings reviewed for today and had a new dressing placed. -Follow-up with TOC for placement   Acute on Chronic HFpEF decompensation, improved -Significant fluid overload, peripheral edema and bilateral crackles, continued IV Lasix 40 mg IV twice daily but have now stopped given her improvement.and transitioned to po  -BNP was elevated at 309 -Echocardiogram ordered and showed G1DD with EF of 65-70% -Echocardiogram 2019 showed significant abnormalities with significant severe pulmonary hypertension, and mild to moderate mitral regurgitation.  On physical exam she does have multiple murmurs and S3.  She has poor activity tolerance but significant exertional dyspnea with minimal activity at home.   -Cardiology consulted for further evaluation -Start beta-blocker -She will need strict I's and O's and daily weights; she is -500 mL since admission -Continue to monitor for signs and symptoms of volume overload -Foley catheter was in place was discontinued on 03/27/2022 given that we stopped her IV diuretics" we will resume oral diuretics in the a.m with Lasix 40 mg p.o. daily -DVT prophylaxis per orthopedic surgery now she is on enoxaparin 40 mg subcu every 24 and also on aspirin 81 mg p.o. daily but she was unable to tolerate the Lovenox injections so this is  now been changed to Xarelto -Continuing carvedilol 3.125 mg p.o. twice daily but held given her lower blood pressure -Repeat chest x-ray yesterday showed "Lungs are hypoinflated without focal airspace consolidation or effusion.  Cardiomediastinal silhouette and remainder of the exam is unchanged."   IDDM with Hyperglycemia -Not adherent with insulin regimen, reason no known -Start sliding scale and was on resistant scale but will cut down to the sensitive NovoLog sign scale -Continue monitor CBGs per protocol; CBGs ranging from 146-217   Pyuria and bacteriuria -Patient was having frequent urination but she is on Lasix; unclear why the Ortho team is worried this given that she is asymptomatic -Urinalysis showed a hazy appearance with amber-colored urine, moderate hemoglobin, moderate leukocytes, rare bacteria, greater than 50 RBCs per high-power field and 21-50 WBCs with 0-5 squamous epithelial cells with a urine culture showing less than 10,000 colonies of insignificant growth -Currently do not have a clinical inclination to treat given that she has no symptoms   HTN -On Lasix as above but will stop and resume her home Lasix given that she appears closer to being euvolemic -hold off verapamil, start Coreg -Continue to monitor blood pressures per protocol -Last blood pressure reading is on the softer side and is now 101/61  Hyponatremia -Mild and  improved.  Sodium went from 134 is now 136 -Continue to monitor and trend and repeat CMP in outpatient setting  Pulmonary HTN -In decompensation with significant peripheral edema, IV Lasix given and now stopped and she is transition back to her p.o. Lasix -ECHO ordered and done and showed ". Left ventricular ejection fraction, by estimation, is 65 to 70%. The  left ventricle has normal function. The left ventricle has no regional wall motion abnormalities. There is mild concentric left ventricular hypertrophy. Left ventricular diastolic   parameters are consistent with Grade I diastolic dysfunction (impaired relaxation). Right ventricular systolic function is normal. The right ventricular size is normal. Left atrial size was mildly dilated. The mitral valve is normal in structure. No evidence of mitral valve regurgitation. Mild mitral stenosis. Severe mitral annular calcification. The aortic valve is tricuspid. Aortic valve regurgitation is trivial. No aortic stenosis is present." -Follow up with Cardiology within 1-2 weeks    Prolonged Qtc -Appears to be a chronic issue, no significant QTc changes compared to EKG in 2018. -Continue Telemetry Monitoring    PVD -CAD equivalent -Considered to be mild on the right side of the leg with the ABI study in 2018 and normal ABI on left side in 2018. -Continue aspirin, outpatient follow-up with PCP.  Normocytic Anemia -Patient's Hgb/Hct went from 9.4/29.1 -> 9.0/27.5 -> 9.1/28.5 -> 9.2/28.2 -Patient's anemia panel done and showed an iron level of 40, U IBC of 209, TIBC of 249, saturation ratio of 16%, ferritin level of 80, folate level of 13.8 and a vitamin B12 level of 1480 -Continue to monitor for signs and symptoms of bleeding; no overt bleeding noted -Repeat CBC within 1 week    Morbid Obesity -Complicates overall prognosis and care -Estimated body mass index is 46.61 kg/m as calculated from the following:   Height as of this encounter: '5\' 2"'$  (1.575 m).   Weight as of this encounter: 115.6 kg.  -Weight Loss and Dietary Counseling given  Nutrition Documentation    [facility] ED to Hosp-Admission (Current) from 03/24/2022 in [place]  Nutrition Problem Increased nutrient needs  Etiology post-op healing  Nutrition Goal Patient will meet greater than or equal to 90% of their needs  Interventions Ensure Enlive (each supplement provides 350kcal and 20 grams of protein), MVI      Consultants: Cardiology; Orthopedic Surgery  Procedures  performed: ECHOCARDIOGRAM; . OPEN REDUCTION INTERNAL FIXATION OF LEFT PERIPROSTHETIC FEMUR FRACTURE WITH BIOMET NCB PLATE 2. APPLICATION OF STRESS UNDER FLUOROSCOPY TO THE LEFT KNEE Disposition: Skilled nursing facility Diet recommendation:  Cardiac and Carb modified diet  DISCHARGE MEDICATION: Allergies as of 03/29/2022   No Known Allergies      Medication List     STOP taking these medications    verapamil 180 MG 24 hr capsule Commonly known as: VERELAN PM       TAKE these medications    albuterol (2.5 MG/3ML) 0.083% nebulizer solution Commonly known as: PROVENTIL Take 3 mLs (2.5 mg total) by nebulization every 4 (four) hours as needed for wheezing or shortness of breath.   aspirin 81 MG chewable tablet Chew 81 mg by mouth every evening.   bisacodyl 5 MG EC tablet Commonly known as: DULCOLAX Take 1 tablet (5 mg total) by mouth daily as needed for moderate constipation.   carvedilol 3.125 MG tablet Commonly known as: COREG Take 1 tablet (3.125 mg total) by mouth 2 (two) times daily with a meal.   CENTRUM SILVER PO  Take 1 tablet by mouth daily.   feeding supplement Liqd Take 237 mLs by mouth 2 (two) times daily between meals.   furosemide 40 MG tablet Commonly known as: LASIX Take 40 mg by mouth daily.   HYDROcodone-acetaminophen 5-325 MG tablet Commonly known as: Norco Take 1-2 tablets by mouth every 6 (six) hours as needed for severe pain.   ondansetron 4 MG tablet Commonly known as: ZOFRAN Take 1 tablet (4 mg total) by mouth every 6 (six) hours as needed for nausea.   Potassium Chloride ER 20 MEQ Tbcr Take one tablet by mouth once daily for hypokalemia   rivaroxaban 10 MG Tabs tablet Commonly known as: Xarelto Take 1 tablet (10 mg total) by mouth daily. For DVT prophylaxis after surgery   senna-docusate 8.6-50 MG tablet Commonly known as: Senokot-S Take 1 tablet by mouth 2 (two) times daily.   simvastatin 40 MG tablet Commonly known as:  ZOCOR Take 40 mg by mouth daily.   Systane Ultra 0.4-0.3 % Soln Generic drug: Polyethyl Glycol-Propyl Glycol Place 2 drops into both eyes 2 (two) times daily as needed (for dry eyes).   Vitamin D (Ergocalciferol) 1.25 MG (50000 UNIT) Caps capsule Commonly known as: DRISDOL Take 1 capsule (50,000 Units total) by mouth every 7 (seven) days. Start taking on: April 02, 2022   vitamin D3 25 MCG tablet Commonly known as: CHOLECALCIFEROL Take 2 tablets (2,000 Units total) by mouth 2 (two) times daily.               Discharge Care Instructions  (From admission, onward)           Start     Ordered   03/29/22 0000  Discharge wound care:       Comments: Per Orthopedic Surgery   03/29/22 1352            Contact information for after-discharge care     Destination     [facility]erred [facility] .   Service: Skilled Nursing Contact information: [address] [place] [phone]                    Discharge Exam: Danley Danker Weights   03/24/22 1149 03/25/22 1518  Weight: 95.3 kg 115.6 kg   Vitals:   03/29/22 0200 03/29/22 0744  BP: 101/61   Pulse: 83   Resp: 20 16  Temp: 98.7 F (37.1 C) 98.8 F (37.1 C)  SpO2: 97%    Examination: Physical Exam:  Constitutional: WN/WD morbidly obese African-American female currently no acute distress Respiratory: Diminished to auscultation bilaterally, no wheezing, rales, rhonchi or crackles. Normal respiratory effort and patient is not tachypenic. No accessory muscle use.  Unlabored breathing Cardiovascular: RRR, no murmurs / rubs / gallops. S1 and S2 auscultated.  Trace extremity edema Abdomen: Soft, non-tender, distended secondary body habitus. Bowel sounds positive.  GU: Deferred. Musculoskeletal: No clubbing / cyanosis of digits/nails. No joint deformity upper and lower extremities.  Skin: Left hip dressing noted.  No appreciable rashes on limited skin  evaluation Neurologic: CN 2-12 grossly intact with no focal deficits. Romberg sign and cerebellar reflexes not assessed.  Psychiatric: Normal judgment and insight. Alert and oriented x 3. Normal mood and appropriate affect.   Condition at discharge: stable  The results of significant diagnostics from this hospitalization (including imaging, microbiology, ancillary and laboratory) are listed below for reference.   Imaging Studies: DG CHEST PORT 1 VIEW  Result Date: 03/28/2022 CLINICAL DATA:  Shortness of  breath. EXAM: PORTABLE CHEST 1 VIEW COMPARISON:  03/26/2022 FINDINGS: Lungs are hypoinflated without focal airspace consolidation or effusion. Cardiomediastinal silhouette and remainder of the exam is unchanged. IMPRESSION: Hypoinflation without acute cardiopulmonary disease. Electronically Signed   By: Marin Olp M.D.   On: 03/28/2022 08:00   DG CHEST PORT 1 VIEW  Result Date: 03/26/2022 CLINICAL DATA:  Shortness of breath EXAM: PORTABLE CHEST 1 VIEW COMPARISON:  Chest x-ray 03/24/2022 FINDINGS: The heart size and mediastinal contours are within normal limits. Both lungs are clear. No acute fractures. Degenerative changes affect the spine and shoulders. IMPRESSION: No active disease. Electronically Signed   By: Ronney Asters M.D.   On: 03/26/2022 19:25   ECHOCARDIOGRAM COMPLETE  Result Date: 03/26/2022    ECHOCARDIOGRAM REPORT   Patient Name:   Toni [name] Date of Exam: 03/26/2022 Medical Rec #:  [id number]       Height:       62.0 in Accession #:    [id number]      Weight:       254.9 lb Date of Birth:  08-23-1932        BSA:          2.119 m Patient Age:    88 years        BP:           113/62 mmHg Patient Gender: F               HR:           78 bpm. Exam Location:  Inpatient Procedure: 2D Echo, Cardiac Doppler and Color Doppler Indications:    CHF  History:        Patient has prior history of Echocardiogram examinations, most                 recent 09/03/2016. Risk Factors:Hypertension and  Diabetes.  Sonographer:    Memory Argue Referring Phys: [place]  1. Left ventricular ejection fraction, by estimation, is 65 to 70%. The left ventricle has normal function. The left ventricle has no regional wall motion abnormalities. There is mild concentric left ventricular hypertrophy. Left ventricular diastolic parameters are consistent with Grade I diastolic dysfunction (impaired relaxation).  2. Right ventricular systolic function is normal. The right ventricular size is normal.  3. Left atrial size was mildly dilated.  4. The mitral valve is normal in structure. No evidence of mitral valve regurgitation. Mild mitral stenosis. Severe mitral annular calcification.  5. The aortic valve is tricuspid. Aortic valve regurgitation is trivial. No aortic stenosis is present. FINDINGS  Left Ventricle: Left ventricular ejection fraction, by estimation, is 65 to 70%. The left ventricle has normal function. The left ventricle has no regional wall motion abnormalities. The left ventricular internal cavity size was normal in size. There is  mild concentric left ventricular hypertrophy. Left ventricular diastolic parameters are consistent with Grade I diastolic dysfunction (impaired relaxation). Right Ventricle: The right ventricular size is normal. Right ventricular systolic function is normal. Left Atrium: Left atrial size was mildly dilated. Right Atrium: Right atrial size was normal in size. Pericardium: There is no evidence of pericardial effusion. Mitral Valve: The mitral valve is normal in structure. Severe mitral annular calcification. No evidence of mitral valve regurgitation. Mild mitral valve stenosis. MV peak gradient, 12.5 mmHg. The mean mitral valve gradient is 7.0 mmHg. Tricuspid Valve: The tricuspid valve is normal in structure. Tricuspid valve regurgitation is mild . No evidence of tricuspid stenosis. Aortic Valve:  The aortic valve is tricuspid. Aortic valve regurgitation is trivial.  Aortic regurgitation PHT measures 450 msec. No aortic stenosis is present. Aortic valve mean gradient measures 5.0 mmHg. Aortic valve peak gradient measures 10.1 mmHg. Aortic valve area, by VTI measures 2.42 cm. Pulmonic Valve: The pulmonic valve was not well visualized. Pulmonic valve regurgitation is not visualized. No evidence of pulmonic stenosis. Aorta: The aortic root is normal in size and structure. Venous: The inferior vena cava was not well visualized. IAS/Shunts: The interatrial septum was not well visualized.  LEFT VENTRICLE PLAX 2D LVIDd:         5.00 cm   Diastology LVIDs:         3.30 cm   LV e' lateral:   6.53 cm/s LV PW:         1.20 cm   LV E/e' lateral: 14.6 LV IVS:        1.20 cm LVOT diam:     2.10 cm LV SV:         69 LV SV Index:   33 LVOT Area:     3.46 cm  RIGHT VENTRICLE TAPSE (M-mode): 2.1 cm LEFT ATRIUM             Index        RIGHT ATRIUM           Index LA Vol (A2C):   74.0 ml 34.92 ml/m  RA Area:     12.00 cm LA Vol (A4C):   75.0 ml 35.39 ml/m  RA Volume:   22.90 ml  10.81 ml/m LA Biplane Vol: 76.0 ml 35.86 ml/m  AORTIC VALVE AV Area (Vmax):    2.31 cm AV Area (Vmean):   2.26 cm AV Area (VTI):     2.42 cm AV Vmax:           159.00 cm/s AV Vmean:          106.000 cm/s AV VTI:            0.285 m AV Peak Grad:      10.1 mmHg AV Mean Grad:      5.0 mmHg LVOT Vmax:         106.00 cm/s LVOT Vmean:        69.200 cm/s LVOT VTI:          0.199 m LVOT/AV VTI ratio: 0.70 AI PHT:            450 msec  AORTA Ao Root diam: 3.10 cm MITRAL VALVE                TRICUSPID VALVE MV Area (PHT): 3.37 cm     TR Peak grad:   43.8 mmHg MV Area VTI:   1.63 cm     TR Vmax:        331.00 cm/s MV Peak grad:  12.5 mmHg MV Mean grad:  7.0 mmHg     SHUNTS MV Vmax:       1.77 m/s     Systemic VTI:  0.20 m MV Vmean:      121.5 cm/s   Systemic Diam: 2.10 cm MV Decel Time: 225 msec MV E velocity: 95.40 cm/s MV A velocity: 173.00 cm/s MV E/A ratio:  0.55 Kirk Ruths MD Electronically signed by Kirk Ruths MD Signature Date/Time: 03/26/2022/12:21:53 PM    Final    DG Knee Left Port  Result Date: 03/25/2022 CLINICAL DATA:  Postop femur surgery. EXAM: PORTABLE LEFT KNEE - 1-2 VIEW COMPARISON:  Preoperative imaging FINDINGS: It is new lateral plate and multi screw fixation of periprosthetic distal femur fracture. There is improved alignment from preoperative imaging with mild residual fracture displacement. Proximal most aspect of the hardware is not entirely included in the field of view. Previous knee arthroplasty. Recent postsurgical change includes air and edema in the soft tissues. IMPRESSION: ORIF of periprosthetic distal femur fracture, in improved alignment from preoperative imaging. Electronically Signed   By: Keith Rake M.D.   On: 03/25/2022 18:45   DG FEMUR MIN 2 VIEWS LEFT  Result Date: 03/25/2022 CLINICAL DATA:  Open reduction internal fixation left femur. EXAM: LEFT FEMUR 2 VIEWS COMPARISON:  Preoperative radiograph yesterday. FINDINGS: Six fluoroscopic spot views of the left femur obtained in the operating room. Lateral plate and multi screw fixation of periprosthetic distal femur fracture. Fluoroscopy time 1 minutes 58 seconds. Dose 33.58 mGy. IMPRESSION: Procedural fluoroscopy for periprosthetic distal femur fracture fixation. Electronically Signed   By: Keith Rake M.D.   On: 03/25/2022 15:11   DG C-Arm 1-60 Min-No Report  Result Date: 03/25/2022 Fluoroscopy was utilized by the requesting physician.  No radiographic interpretation.   DG C-Arm 1-60 Min-No Report  Result Date: 03/25/2022 Fluoroscopy was utilized by the requesting physician.  No radiographic interpretation.   DG Tibia/Fibula Left  Result Date: 03/24/2022 CLINICAL DATA:  Fall from bed. EXAM: LEFT TIBIA AND FIBULA - 2 VIEW COMPARISON:  Femoral radiograph same day FINDINGS: LEFT total knee arthroplasty noted. Extensive heterotopic ossification extending superior to the patella. Bulky osteophytosis along the  medial compartment. Fracture of the distal fibula extends towards the lateral femoral condyle. See femoral radiograph. No fracture of the distal tibia. IMPRESSION: 1. Distal femur fracture extends towards the lateral femoral condyle. Incompletely imaged. 2. No tibia or fibular fracture. Electronically Signed   By: Suzy Bouchard M.D.   On: 03/24/2022 15:46   DG Chest 1 View  Result Date: 03/24/2022 CLINICAL DATA:  CHF EXAM: CHEST  1 VIEW COMPARISON:  September 14, 2016. FINDINGS: Enlarged cardiac silhouette with central vascular prominence. Aortic atherosclerosis. No overt pulmonary edema or focal airspace consolidation. No visible pleural effusion or pneumothorax. Advanced degenerative change of the bilateral shoulders. Thoracic spondylosis. IMPRESSION: Cardiomegaly with central vascular prominence without overt pulmonary edema or focal airspace consolidation. Aortic Atherosclerosis (ICD10-I70.0). Electronically Signed   By: Dahlia Bailiff M.D.   On: 03/24/2022 15:27   DG Femur Min 2 Views Left  Result Date: 03/24/2022 CLINICAL DATA:  [place] out of bed yesterday.  Left leg pain. EXAM: LEFT FEMUR 2 VIEWS COMPARISON:  None Available. FINDINGS: Oblique fracture of the distal femur, extending from the midshaft to the distal femur. Proximal component of the fracture is displaced medially by 4.4 cm. Fracture is mildly angulated posteriorly. Femoral component of the knee prosthesis is well aligned, stem lying within the fractured distal component. There is ossification along the quadriceps tendon. Advanced arthropathic changes of the hip joint as detailed under the pelvis radiographs. Knee joint prosthetic components are normally aligned. There is soft tissue edema the mid to distal thigh. IMPRESSION: 1. Displaced fracture of the left femur extending from the midshaft to the distal femur. Electronically Signed   By: Lajean Manes M.D.   On: 03/24/2022 13:40   DG Pelvis 1-2 Views  Result Date: 03/24/2022 CLINICAL  DATA:  [place] out of bed yesterday. Hip pain. Left knee pain. EXAM: PELVIS - 1-2 VIEW COMPARISON:  None Available. FINDINGS: Advanced arthropathic changes of the left hip, with marked concentric joint  space narrowing, subchondral sclerosis and prominent osteophytes. Femoral neck appears aligned with the more superior aspect of the femoral head, but there is no defined fracture line. There is surrounding heterotopic bone formation. All these findings are likely chronic. Mild superolateral right hip joint space narrowing. SI joints and pubic symphysis are normally spaced and aligned. No bone lesion. No definite acute fracture. IMPRESSION: 1. No convincing acute fracture, although a subtle fracture of the proximal left femur is difficult to exclude due to the extensive arthropathic changes and heterotopic bone formation. If there is significant clinical suspicion for a proximal left femur fracture, recommend follow-up left hip MRI versus high-resolution unenhanced CT. Electronically Signed   By: Lajean Manes M.D.   On: 03/24/2022 12:55    Microbiology: Results for orders placed or performed during the hospital encounter of 03/24/22  Urine Culture     Status: Abnormal   Collection Time: 03/26/22 12:54 PM   Specimen: Urine, Catheterized  Result Value Ref Range Status   Specimen Description URINE, CATHETERIZED  Final   Special Requests NONE  Final   Culture (A)  Final    <10,000 COLONIES/mL INSIGNIFICANT GROWTH Performed at [facility], [address] [place], [place] [address]    Report Status 03/27/2022 FINAL  Final   Labs: CBC: Recent Labs  Lab 03/24/22 1400 03/25/22 0235 03/26/22 0103 03/27/22 0104 03/28/22 0045 03/29/22 0146  WBC 6.0 6.1 9.9 8.6 9.2 10.0  NEUTROABS 3.9  --   --  5.1 5.0 5.4  HGB 12.0 11.1* 9.4* 9.0* 9.1* 9.2*  HCT 38.2 36.4 29.1* 27.5* 28.5* 28.2*  MCV 92.3 94.1 90.1 89.3 90.5 91.6  PLT 165 162 153 176 202 696   Basic Metabolic Panel: Recent Labs  Lab  03/24/22 1828 03/26/22 0103 03/27/22 0104 03/28/22 0045 03/29/22 0146  NA 140 137 134* 134* 136  K 4.5 4.2 3.6 3.4* 5.1  CL 103 100 94* 93* 94*  CO2 '27 26 30 31 '$ 33*  GLUCOSE 163* 238* 180* 191* 164*  BUN '13 18 22 21 19  '$ CREATININE 0.61 0.78 0.74 0.67 0.59  CALCIUM 8.8* 8.3* 8.4* 8.0* 8.6*  MG 1.9  --  1.8 2.0 2.0  PHOS 3.3  --  3.0 2.5 2.6   Liver Function Tests: Recent Labs  Lab 03/24/22 1828 03/27/22 0104 03/28/22 0045 03/29/22 0146  AST '20 18 19 20  '$ ALT '13 12 10 12  '$ ALKPHOS 68 51 55 57  BILITOT 0.6 0.9 0.6 1.0  PROT 6.0* 5.6* 5.5* 5.7*  ALBUMIN 2.9* 2.7* 2.6* 2.6*   CBG: Recent Labs  Lab 03/28/22 1157 03/28/22 1603 03/28/22 1934 03/29/22 0747 03/29/22 1150  GLUCAP 323* 217* 170* 185* 146*   Discharge time spent: greater than 30 minutes.  Signed: Raiford Noble, DO [facility] 03/29/2022

## 2022-03-29 NOTE — Progress Notes (Signed)
IV removed. PTAR here for pick up. Daughter aware. Glasses on patient upon discharge to go to facility with her. No other personal belongings.

## 2022-03-29 NOTE — Progress Notes (Signed)
Mobility Specialist Progress Note   03/29/22 1717  Mobility  Activity Turned to back - supine;Turned to left side;Turned to right side  Level of Assistance +2 (takes two people)  Assistive Device  (HHA)  Activity Response Tolerated well  $Mobility charge 1 Mobility   Received pt in bed having minimal LLE pain and agreeable to sit in chair for dinner. Pt limited by pain and expressing it vocally when being turned for lift pad placement. As soon as pt was being lifted we received word from the RN that PTAR would be arriving in ~5 mins. Left pt supine for convenience, RN present in room.    Holland Falling Mobility Specialist MS [facility] #:  [phone] Acute Rehab Office:  [phone]

## 2022-03-29 NOTE — Progress Notes (Addendum)
Physical Therapy Treatment Patient Details Name: Toni [name] MRN: [id number] DOB: [birth year] Today's Date: 03/29/2022   History of Present Illness Patient is 88 y.o. female admitted after ground level fall with Lt periprosthetic distal femur fracture now s/p ORIF and NWB on Lt LE. PMH significant for DM, HTN, PVD, obesity. Pt limited ambulator at baseline.    PT Comments    Pt instructed in and performed bed mobility and transfer training. Pt unable to complete full sit to stand with max assist x 2. Pt not demonstrating adequate UE strength to attempt lateral scoot transfer. Pt will continue to require maxi sky for OOB mobility.    Recommendations for follow up therapy are one component of a multi-disciplinary discharge planning process, led by the attending physician.  Recommendations may be updated based on patient status, additional functional criteria and insurance authorization.  Follow Up Recommendations  Skilled nursing-short term rehab (<3 hours/day) Can patient physically be transported by private vehicle: No   Assistance Recommended at Discharge Frequent or constant Supervision/Assistance  Patient can return home with the following Two people to help with walking and/or transfers;Two people to help with bathing/dressing/bathroom;Assistance with cooking/housework;Assistance with feeding;Direct supervision/assist for medications management;Assist for transportation;Help with stairs or ramp for entrance   Equipment Recommendations   (TBD at next venue)    Recommendations for Other Services       Precautions / Restrictions Precautions Precautions: Fall Restrictions Weight Bearing Restrictions: Yes LLE Weight Bearing: Non weight bearing     Mobility  Bed Mobility Overal bed mobility: Needs Assistance Bed Mobility: Supine to Sit, Sit to Supine     Supine to sit: +2 for physical assistance, HOB elevated, Mod assist Sit to supine: +2 for physical assistance, Max assist    General bed mobility comments: Pt required cues for sequencing. Pt utilized bed rail during supine to sit and needed sling pad to assist her with scooting towards EOB. Pt required max assist x 2 with pad to scoot EOB (minimal movement).    Transfers Overall transfer level: Needs assistance Equipment used: Rolling walker (2 wheels), 1 person hand held assist (arm rest on bedside chair) Transfers: Sit to/from Stand Sit to Stand: Max assist, +2 physical assistance           General transfer comment: Attempted two sit to stands. Pt completed quarter sit to stand on first attempt using armrest on chair with left hand and HHA on right. Pt completed half sit to stand on second attempt using RW. Pt required blocking of R LE and cues to maintain WB status on L LE (also kept foot in front of pt's L LE to avoid weight being placed). Sling pad utilized each attempt for boosting.    Ambulation/Gait               General Gait Details: unable   Stairs             Wheelchair Mobility    Modified Rankin (Stroke Patients Only)       Balance Overall balance assessment: Needs assistance Sitting-balance support: Feet supported, Bilateral upper extremity supported Sitting balance-Leahy Scale: Poor Sitting balance - Comments: reliant on UE support     Standing balance-Leahy Scale: Zero Standing balance comment: unable to stand with max A x 2                            Cognition Arousal/Alertness: Awake/alert Behavior During Therapy: [facility] for tasks assessed/performed  Overall Cognitive Status: Within Functional Limits for tasks assessed                                 General Comments: Slow processing at times        Exercises      General Comments General comments (skin integrity, edema, etc.): HR 88, 93% SpO2, and 20 RR on RA      Pertinent Vitals/Pain Pain Assessment Pain Assessment: 0-10 Pain Score: 6  Pain Location: L LE Pain  Descriptors / Indicators: Grimacing, Guarding Pain Intervention(s): Monitored during session, Limited activity within patient's tolerance    Home Living                          Prior Function            PT Goals (current goals can now be found in the care plan section) Acute Rehab PT Goals Patient Stated Goal: to heal and get better PT Goal Formulation: With patient Time For Goal Achievement: 04/09/22 Potential to Achieve Goals: Fair Progress towards PT goals: Progressing toward goals (slow and limited)    Frequency    Min 3X/week      PT Plan Current plan remains appropriate    Co-evaluation              AM-PAC PT "6 Clicks" Mobility   Outcome Measure  Help needed turning from your back to your side while in a flat bed without using bedrails?: Total Help needed moving from lying on your back to sitting on the side of a flat bed without using bedrails?: Total Help needed moving to and from a bed to a chair (including a wheelchair)?: Total Help needed standing up from a chair using your arms (e.g., wheelchair or bedside chair)?: Total Help needed to walk in hospital room?: Total Help needed climbing 3-5 steps with a railing? : Total 6 Click Score: 6    End of Session   Activity Tolerance: Patient tolerated treatment well Patient left: with call bell/phone within reach;in bed;with bed alarm set Nurse Communication: Mobility status;Need for lift equipment;Weight bearing status PT Visit Diagnosis: Other abnormalities of gait and mobility (R26.89);Muscle weakness (generalized) (M62.81);Difficulty in walking, not elsewhere classified (R26.2);Other symptoms and signs involving the nervous system (R29.898);Pain;History of falling (Z91.81) Pain - Right/Left: Left Pain - part of body: Hip;Knee     Time: 6789-3810 PT Time Calculation (min) (ACUTE ONLY): 22 min  Charges:  $Therapeutic Activity: 8-22 mins                     Donna Bernard, PT    [employer] 03/29/2022, 2:16 PM

## 2022-03-29 NOTE — Plan of Care (Signed)
Discharge packet placed in drawer for PTAR. Report called to [facility]. Careplan resolved. Pt adequate for discharge. Awaiting pick up.  Problem: Education: Goal: Ability to describe self-care measures that may prevent or decrease complications (Diabetes Survival Skills Education) will improve Outcome: Adequate for Discharge Goal: Individualized Educational Video(s) Outcome: Adequate for Discharge   Problem: Coping: Goal: Ability to adjust to condition or change in health will improve Outcome: Adequate for Discharge   Problem: Fluid Volume: Goal: Ability to maintain a balanced intake and output will improve Outcome: Adequate for Discharge   Problem: Health Behavior/Discharge Planning: Goal: Ability to identify and utilize available resources and services will improve Outcome: Adequate for Discharge Goal: Ability to manage health-related needs will improve Outcome: Adequate for Discharge   Problem: Metabolic: Goal: Ability to maintain appropriate glucose levels will improve Outcome: Adequate for Discharge   Problem: Nutritional: Goal: Maintenance of adequate nutrition will improve Outcome: Adequate for Discharge Goal: Progress toward achieving an optimal weight will improve Outcome: Adequate for Discharge   Problem: Skin Integrity: Goal: Risk for impaired skin integrity will decrease Outcome: Adequate for Discharge   Problem: Tissue Perfusion: Goal: Adequacy of tissue perfusion will improve Outcome: Adequate for Discharge   Problem: Education: Goal: Knowledge of General Education information will improve Description: Including pain rating scale, medication(s)/side effects and non-pharmacologic comfort measures Outcome: Adequate for Discharge   Problem: Health Behavior/Discharge Planning: Goal: Ability to manage health-related needs will improve Outcome: Adequate for Discharge   Problem: Clinical Measurements: Goal: Ability to maintain clinical measurements within  normal limits will improve Outcome: Adequate for Discharge Goal: Will remain free from infection Outcome: Adequate for Discharge Goal: Diagnostic test results will improve Outcome: Adequate for Discharge Goal: Respiratory complications will improve Outcome: Adequate for Discharge Goal: Cardiovascular complication will be avoided Outcome: Adequate for Discharge   Problem: Activity: Goal: Risk for activity intolerance will decrease Outcome: Adequate for Discharge   Problem: Nutrition: Goal: Adequate nutrition will be maintained Outcome: Adequate for Discharge   Problem: Coping: Goal: Level of anxiety will decrease Outcome: Adequate for Discharge   Problem: Elimination: Goal: Will not experience complications related to bowel motility Outcome: Adequate for Discharge Goal: Will not experience complications related to urinary retention Outcome: Adequate for Discharge   Problem: Pain Managment: Goal: General experience of comfort will improve Outcome: Adequate for Discharge   Problem: Safety: Goal: Ability to remain free from injury will improve Outcome: Adequate for Discharge   Problem: Skin Integrity: Goal: Risk for impaired skin integrity will decrease Outcome: Adequate for Discharge

## 2022-03-29 NOTE — TOC Transition Note (Signed)
Transition of Care [facility]) - CM/SW Discharge Note   Patient Details  Name: Toni [name] MRN: [id number] Date of Birth: 01/26/1933  [facility]) CM/SW Contact:  Coralee Pesa, [place] Phone Number: 03/29/2022, 2:00 PM   Clinical Narrative:    Pt to be transported to [facility] via [place]. Nurse to call report to [place]. RN, please notify daughter when transport arrives.   Final next level of care: Skilled Nursing Facility Barriers to Discharge: Barriers Resolved   Patient Goals and CMS Choice Patient states their goals for this hospitalization and ongoing recovery are:: Get stronger soon CMS Medicare.gov Compare Post Acute Care list provided to:: Patient Represenative (must comment) Choice offered to / list presented to : Adult Children  Discharge Placement              Patient chooses bed at: [facility] Patient to be transferred to facility by: [place] Name of family member notified: Pamala Hurry Patient and family notified of of transfer: 03/29/22  Discharge Plan and Services In-house Referral: Clinical Social Work                                   Social Determinants of Health (SDOH) Interventions     Readmission Risk Interventions     No data to display

## 2022-03-30 DIAGNOSIS — Z9181 History of falling: Secondary | ICD-10-CM | POA: Diagnosis not present

## 2022-03-30 DIAGNOSIS — R2689 Other abnormalities of gait and mobility: Secondary | ICD-10-CM | POA: Diagnosis not present

## 2022-03-30 DIAGNOSIS — D649 Anemia, unspecified: Secondary | ICD-10-CM | POA: Diagnosis not present

## 2022-03-30 DIAGNOSIS — I5032 Chronic diastolic (congestive) heart failure: Secondary | ICD-10-CM | POA: Diagnosis not present

## 2022-03-30 DIAGNOSIS — M9712XD Periprosthetic fracture around internal prosthetic left knee joint, subsequent encounter: Secondary | ICD-10-CM | POA: Diagnosis not present

## 2022-03-30 DIAGNOSIS — I272 Pulmonary hypertension, unspecified: Secondary | ICD-10-CM | POA: Diagnosis not present

## 2022-03-30 DIAGNOSIS — E118 Type 2 diabetes mellitus with unspecified complications: Secondary | ICD-10-CM | POA: Diagnosis not present

## 2022-03-30 DIAGNOSIS — E785 Hyperlipidemia, unspecified: Secondary | ICD-10-CM | POA: Diagnosis not present

## 2022-03-31 DIAGNOSIS — I5033 Acute on chronic diastolic (congestive) heart failure: Secondary | ICD-10-CM | POA: Diagnosis not present

## 2022-03-31 DIAGNOSIS — I739 Peripheral vascular disease, unspecified: Secondary | ICD-10-CM | POA: Diagnosis not present

## 2022-03-31 DIAGNOSIS — E1165 Type 2 diabetes mellitus with hyperglycemia: Secondary | ICD-10-CM | POA: Diagnosis not present

## 2022-03-31 DIAGNOSIS — D638 Anemia in other chronic diseases classified elsewhere: Secondary | ICD-10-CM | POA: Diagnosis not present

## 2022-03-31 DIAGNOSIS — S72001A Fracture of unspecified part of neck of right femur, initial encounter for closed fracture: Secondary | ICD-10-CM | POA: Diagnosis not present

## 2022-03-31 DIAGNOSIS — E871 Hypo-osmolality and hyponatremia: Secondary | ICD-10-CM | POA: Diagnosis not present

## 2022-03-31 DIAGNOSIS — M6281 Muscle weakness (generalized): Secondary | ICD-10-CM | POA: Diagnosis not present

## 2022-03-31 DIAGNOSIS — I270 Primary pulmonary hypertension: Secondary | ICD-10-CM | POA: Diagnosis not present

## 2022-03-31 DIAGNOSIS — I10 Essential (primary) hypertension: Secondary | ICD-10-CM | POA: Diagnosis not present

## 2022-03-31 DIAGNOSIS — R296 Repeated falls: Secondary | ICD-10-CM | POA: Diagnosis not present

## 2022-04-02 DIAGNOSIS — I272 Pulmonary hypertension, unspecified: Secondary | ICD-10-CM | POA: Diagnosis not present

## 2022-04-02 DIAGNOSIS — M6281 Muscle weakness (generalized): Secondary | ICD-10-CM | POA: Diagnosis not present

## 2022-04-02 DIAGNOSIS — E118 Type 2 diabetes mellitus with unspecified complications: Secondary | ICD-10-CM | POA: Diagnosis not present

## 2022-04-02 DIAGNOSIS — R296 Repeated falls: Secondary | ICD-10-CM | POA: Diagnosis not present

## 2022-04-02 DIAGNOSIS — E1165 Type 2 diabetes mellitus with hyperglycemia: Secondary | ICD-10-CM | POA: Diagnosis not present

## 2022-04-02 DIAGNOSIS — I270 Primary pulmonary hypertension: Secondary | ICD-10-CM | POA: Diagnosis not present

## 2022-04-02 DIAGNOSIS — I5033 Acute on chronic diastolic (congestive) heart failure: Secondary | ICD-10-CM | POA: Diagnosis not present

## 2022-04-02 DIAGNOSIS — D638 Anemia in other chronic diseases classified elsewhere: Secondary | ICD-10-CM | POA: Diagnosis not present

## 2022-04-02 DIAGNOSIS — E785 Hyperlipidemia, unspecified: Secondary | ICD-10-CM | POA: Diagnosis not present

## 2022-04-02 DIAGNOSIS — S72001A Fracture of unspecified part of neck of right femur, initial encounter for closed fracture: Secondary | ICD-10-CM | POA: Diagnosis not present

## 2022-04-02 DIAGNOSIS — Z9181 History of falling: Secondary | ICD-10-CM | POA: Diagnosis not present

## 2022-04-02 DIAGNOSIS — I739 Peripheral vascular disease, unspecified: Secondary | ICD-10-CM | POA: Diagnosis not present

## 2022-04-02 DIAGNOSIS — I10 Essential (primary) hypertension: Secondary | ICD-10-CM | POA: Diagnosis not present

## 2022-04-02 DIAGNOSIS — D649 Anemia, unspecified: Secondary | ICD-10-CM | POA: Diagnosis not present

## 2022-04-02 DIAGNOSIS — I5032 Chronic diastolic (congestive) heart failure: Secondary | ICD-10-CM | POA: Diagnosis not present

## 2022-04-02 DIAGNOSIS — R2689 Other abnormalities of gait and mobility: Secondary | ICD-10-CM | POA: Diagnosis not present

## 2022-04-02 DIAGNOSIS — E871 Hypo-osmolality and hyponatremia: Secondary | ICD-10-CM | POA: Diagnosis not present

## 2022-04-02 DIAGNOSIS — M9712XD Periprosthetic fracture around internal prosthetic left knee joint, subsequent encounter: Secondary | ICD-10-CM | POA: Diagnosis not present

## 2022-04-05 DIAGNOSIS — R300 Dysuria: Secondary | ICD-10-CM | POA: Diagnosis not present

## 2022-04-05 DIAGNOSIS — D638 Anemia in other chronic diseases classified elsewhere: Secondary | ICD-10-CM | POA: Diagnosis not present

## 2022-04-05 DIAGNOSIS — I10 Essential (primary) hypertension: Secondary | ICD-10-CM | POA: Diagnosis not present

## 2022-04-05 DIAGNOSIS — M6281 Muscle weakness (generalized): Secondary | ICD-10-CM | POA: Diagnosis not present

## 2022-04-05 DIAGNOSIS — I270 Primary pulmonary hypertension: Secondary | ICD-10-CM | POA: Diagnosis not present

## 2022-04-05 DIAGNOSIS — I5033 Acute on chronic diastolic (congestive) heart failure: Secondary | ICD-10-CM | POA: Diagnosis not present

## 2022-04-05 DIAGNOSIS — I739 Peripheral vascular disease, unspecified: Secondary | ICD-10-CM | POA: Diagnosis not present

## 2022-04-05 DIAGNOSIS — E871 Hypo-osmolality and hyponatremia: Secondary | ICD-10-CM | POA: Diagnosis not present

## 2022-04-05 DIAGNOSIS — M5459 Other low back pain: Secondary | ICD-10-CM | POA: Diagnosis not present

## 2022-04-05 DIAGNOSIS — R296 Repeated falls: Secondary | ICD-10-CM | POA: Diagnosis not present

## 2022-04-05 DIAGNOSIS — E1165 Type 2 diabetes mellitus with hyperglycemia: Secondary | ICD-10-CM | POA: Diagnosis not present

## 2022-04-05 DIAGNOSIS — S72001A Fracture of unspecified part of neck of right femur, initial encounter for closed fracture: Secondary | ICD-10-CM | POA: Diagnosis not present

## 2022-04-06 DIAGNOSIS — E871 Hypo-osmolality and hyponatremia: Secondary | ICD-10-CM | POA: Diagnosis not present

## 2022-04-06 DIAGNOSIS — I270 Primary pulmonary hypertension: Secondary | ICD-10-CM | POA: Diagnosis not present

## 2022-04-06 DIAGNOSIS — I5033 Acute on chronic diastolic (congestive) heart failure: Secondary | ICD-10-CM | POA: Diagnosis not present

## 2022-04-06 DIAGNOSIS — E1165 Type 2 diabetes mellitus with hyperglycemia: Secondary | ICD-10-CM | POA: Diagnosis not present

## 2022-04-06 DIAGNOSIS — L988 Other specified disorders of the skin and subcutaneous tissue: Secondary | ICD-10-CM | POA: Diagnosis not present

## 2022-04-06 DIAGNOSIS — I739 Peripheral vascular disease, unspecified: Secondary | ICD-10-CM | POA: Diagnosis not present

## 2022-04-06 DIAGNOSIS — I5032 Chronic diastolic (congestive) heart failure: Secondary | ICD-10-CM | POA: Diagnosis not present

## 2022-04-06 DIAGNOSIS — S72001A Fracture of unspecified part of neck of right femur, initial encounter for closed fracture: Secondary | ICD-10-CM | POA: Diagnosis not present

## 2022-04-06 DIAGNOSIS — D649 Anemia, unspecified: Secondary | ICD-10-CM | POA: Diagnosis not present

## 2022-04-06 DIAGNOSIS — R296 Repeated falls: Secondary | ICD-10-CM | POA: Diagnosis not present

## 2022-04-06 DIAGNOSIS — Z9181 History of falling: Secondary | ICD-10-CM | POA: Diagnosis not present

## 2022-04-06 DIAGNOSIS — M6281 Muscle weakness (generalized): Secondary | ICD-10-CM | POA: Diagnosis not present

## 2022-04-06 DIAGNOSIS — R2689 Other abnormalities of gait and mobility: Secondary | ICD-10-CM | POA: Diagnosis not present

## 2022-04-06 DIAGNOSIS — E118 Type 2 diabetes mellitus with unspecified complications: Secondary | ICD-10-CM | POA: Diagnosis not present

## 2022-04-06 DIAGNOSIS — I272 Pulmonary hypertension, unspecified: Secondary | ICD-10-CM | POA: Diagnosis not present

## 2022-04-06 DIAGNOSIS — D638 Anemia in other chronic diseases classified elsewhere: Secondary | ICD-10-CM | POA: Diagnosis not present

## 2022-04-06 DIAGNOSIS — I10 Essential (primary) hypertension: Secondary | ICD-10-CM | POA: Diagnosis not present

## 2022-04-06 DIAGNOSIS — E785 Hyperlipidemia, unspecified: Secondary | ICD-10-CM | POA: Diagnosis not present

## 2022-04-06 DIAGNOSIS — M9712XD Periprosthetic fracture around internal prosthetic left knee joint, subsequent encounter: Secondary | ICD-10-CM | POA: Diagnosis not present

## 2022-04-07 DIAGNOSIS — R296 Repeated falls: Secondary | ICD-10-CM | POA: Diagnosis not present

## 2022-04-07 DIAGNOSIS — I5033 Acute on chronic diastolic (congestive) heart failure: Secondary | ICD-10-CM | POA: Diagnosis not present

## 2022-04-07 DIAGNOSIS — R300 Dysuria: Secondary | ICD-10-CM | POA: Diagnosis not present

## 2022-04-07 DIAGNOSIS — M5459 Other low back pain: Secondary | ICD-10-CM | POA: Diagnosis not present

## 2022-04-07 DIAGNOSIS — I270 Primary pulmonary hypertension: Secondary | ICD-10-CM | POA: Diagnosis not present

## 2022-04-07 DIAGNOSIS — D638 Anemia in other chronic diseases classified elsewhere: Secondary | ICD-10-CM | POA: Diagnosis not present

## 2022-04-07 DIAGNOSIS — E871 Hypo-osmolality and hyponatremia: Secondary | ICD-10-CM | POA: Diagnosis not present

## 2022-04-07 DIAGNOSIS — I739 Peripheral vascular disease, unspecified: Secondary | ICD-10-CM | POA: Diagnosis not present

## 2022-04-07 DIAGNOSIS — S72001A Fracture of unspecified part of neck of right femur, initial encounter for closed fracture: Secondary | ICD-10-CM | POA: Diagnosis not present

## 2022-04-07 DIAGNOSIS — M6281 Muscle weakness (generalized): Secondary | ICD-10-CM | POA: Diagnosis not present

## 2022-04-07 DIAGNOSIS — I10 Essential (primary) hypertension: Secondary | ICD-10-CM | POA: Diagnosis not present

## 2022-04-07 DIAGNOSIS — E1165 Type 2 diabetes mellitus with hyperglycemia: Secondary | ICD-10-CM | POA: Diagnosis not present

## 2022-04-08 ENCOUNTER — Other Ambulatory Visit: Payer: Self-pay | Admitting: *Deleted

## 2022-04-08 NOTE — Patient Outreach (Signed)
Mrs. Teti resides in [facility] Screening for potential [facility] care coordination needs as benefit of insurance plan and PCP.   Secure communication sent to SNF SW to make aware writer is following for potential [facility] services.    Marthenia Rolling, MSN, RN,BSN [place] Acute Care Coordinator [phone] (Direct dial)

## 2022-04-09 DIAGNOSIS — M898X5 Other specified disorders of bone, thigh: Secondary | ICD-10-CM | POA: Diagnosis not present

## 2022-04-09 DIAGNOSIS — M1712 Unilateral primary osteoarthritis, left knee: Secondary | ICD-10-CM | POA: Diagnosis not present

## 2022-04-12 ENCOUNTER — Other Ambulatory Visit: Payer: Self-pay | Admitting: *Deleted

## 2022-04-12 DIAGNOSIS — S72302D Unspecified fracture of shaft of left femur, subsequent encounter for closed fracture with routine healing: Secondary | ICD-10-CM | POA: Diagnosis not present

## 2022-04-12 DIAGNOSIS — M9712XD Periprosthetic fracture around internal prosthetic left knee joint, subsequent encounter: Secondary | ICD-10-CM | POA: Diagnosis not present

## 2022-04-12 NOTE — Patient Outreach (Signed)
Mrs. Zuba currently resides in [facility]. Screening for potential [facility] care coordination needs as benefit of insurance plan and PCP.  Facility site visit to [facility] Met with [place], SNF SW who reports Mrs. Wandel is from home with family. Anticipate plan is to return home. Daughter Pamala Hurry is primary contact.  Met with Mrs. Hoecker at bedside briefly. She was about to get into bed. States she is tired from such a long day going to doctor's appointment. Writer left [facility] literature and writer's contact information at bedside.  Will continue to follow and plan outreach to daughter Pamala Hurry, as appropriate, to discuss [facility] services.   Marthenia Rolling, MSN, RN,BSN [place] Acute Care Coordinator [phone] (Direct dial)

## 2022-04-13 DIAGNOSIS — I5032 Chronic diastolic (congestive) heart failure: Secondary | ICD-10-CM | POA: Diagnosis not present

## 2022-04-13 DIAGNOSIS — M9712XD Periprosthetic fracture around internal prosthetic left knee joint, subsequent encounter: Secondary | ICD-10-CM | POA: Diagnosis not present

## 2022-04-13 DIAGNOSIS — E118 Type 2 diabetes mellitus with unspecified complications: Secondary | ICD-10-CM | POA: Diagnosis not present

## 2022-04-13 DIAGNOSIS — L988 Other specified disorders of the skin and subcutaneous tissue: Secondary | ICD-10-CM | POA: Diagnosis not present

## 2022-04-13 DIAGNOSIS — D649 Anemia, unspecified: Secondary | ICD-10-CM | POA: Diagnosis not present

## 2022-04-13 DIAGNOSIS — I272 Pulmonary hypertension, unspecified: Secondary | ICD-10-CM | POA: Diagnosis not present

## 2022-04-13 DIAGNOSIS — R2689 Other abnormalities of gait and mobility: Secondary | ICD-10-CM | POA: Diagnosis not present

## 2022-04-13 DIAGNOSIS — Z9181 History of falling: Secondary | ICD-10-CM | POA: Diagnosis not present

## 2022-04-13 DIAGNOSIS — E785 Hyperlipidemia, unspecified: Secondary | ICD-10-CM | POA: Diagnosis not present

## 2022-04-14 DIAGNOSIS — S72001A Fracture of unspecified part of neck of right femur, initial encounter for closed fracture: Secondary | ICD-10-CM | POA: Diagnosis not present

## 2022-04-14 DIAGNOSIS — M5459 Other low back pain: Secondary | ICD-10-CM | POA: Diagnosis not present

## 2022-04-14 DIAGNOSIS — M6281 Muscle weakness (generalized): Secondary | ICD-10-CM | POA: Diagnosis not present

## 2022-04-14 DIAGNOSIS — E1165 Type 2 diabetes mellitus with hyperglycemia: Secondary | ICD-10-CM | POA: Diagnosis not present

## 2022-04-14 DIAGNOSIS — D638 Anemia in other chronic diseases classified elsewhere: Secondary | ICD-10-CM | POA: Diagnosis not present

## 2022-04-14 DIAGNOSIS — E871 Hypo-osmolality and hyponatremia: Secondary | ICD-10-CM | POA: Diagnosis not present

## 2022-04-14 DIAGNOSIS — I270 Primary pulmonary hypertension: Secondary | ICD-10-CM | POA: Diagnosis not present

## 2022-04-14 DIAGNOSIS — I739 Peripheral vascular disease, unspecified: Secondary | ICD-10-CM | POA: Diagnosis not present

## 2022-04-14 DIAGNOSIS — I10 Essential (primary) hypertension: Secondary | ICD-10-CM | POA: Diagnosis not present

## 2022-04-14 DIAGNOSIS — R300 Dysuria: Secondary | ICD-10-CM | POA: Diagnosis not present

## 2022-04-14 DIAGNOSIS — I5033 Acute on chronic diastolic (congestive) heart failure: Secondary | ICD-10-CM | POA: Diagnosis not present

## 2022-04-14 DIAGNOSIS — R296 Repeated falls: Secondary | ICD-10-CM | POA: Diagnosis not present

## 2022-04-16 DIAGNOSIS — I272 Pulmonary hypertension, unspecified: Secondary | ICD-10-CM | POA: Diagnosis not present

## 2022-04-16 DIAGNOSIS — M9712XD Periprosthetic fracture around internal prosthetic left knee joint, subsequent encounter: Secondary | ICD-10-CM | POA: Diagnosis not present

## 2022-04-16 DIAGNOSIS — Z9181 History of falling: Secondary | ICD-10-CM | POA: Diagnosis not present

## 2022-04-16 DIAGNOSIS — R2689 Other abnormalities of gait and mobility: Secondary | ICD-10-CM | POA: Diagnosis not present

## 2022-04-16 DIAGNOSIS — E785 Hyperlipidemia, unspecified: Secondary | ICD-10-CM | POA: Diagnosis not present

## 2022-04-16 DIAGNOSIS — E118 Type 2 diabetes mellitus with unspecified complications: Secondary | ICD-10-CM | POA: Diagnosis not present

## 2022-04-16 DIAGNOSIS — D649 Anemia, unspecified: Secondary | ICD-10-CM | POA: Diagnosis not present

## 2022-04-16 DIAGNOSIS — I5032 Chronic diastolic (congestive) heart failure: Secondary | ICD-10-CM | POA: Diagnosis not present

## 2022-04-20 DIAGNOSIS — R2689 Other abnormalities of gait and mobility: Secondary | ICD-10-CM | POA: Diagnosis not present

## 2022-04-20 DIAGNOSIS — D649 Anemia, unspecified: Secondary | ICD-10-CM | POA: Diagnosis not present

## 2022-04-20 DIAGNOSIS — E118 Type 2 diabetes mellitus with unspecified complications: Secondary | ICD-10-CM | POA: Diagnosis not present

## 2022-04-20 DIAGNOSIS — I5032 Chronic diastolic (congestive) heart failure: Secondary | ICD-10-CM | POA: Diagnosis not present

## 2022-04-20 DIAGNOSIS — Z9181 History of falling: Secondary | ICD-10-CM | POA: Diagnosis not present

## 2022-04-20 DIAGNOSIS — I272 Pulmonary hypertension, unspecified: Secondary | ICD-10-CM | POA: Diagnosis not present

## 2022-04-20 DIAGNOSIS — M9712XD Periprosthetic fracture around internal prosthetic left knee joint, subsequent encounter: Secondary | ICD-10-CM | POA: Diagnosis not present

## 2022-04-20 DIAGNOSIS — E785 Hyperlipidemia, unspecified: Secondary | ICD-10-CM | POA: Diagnosis not present

## 2022-04-23 DIAGNOSIS — I5032 Chronic diastolic (congestive) heart failure: Secondary | ICD-10-CM | POA: Diagnosis not present

## 2022-04-23 DIAGNOSIS — R2689 Other abnormalities of gait and mobility: Secondary | ICD-10-CM | POA: Diagnosis not present

## 2022-04-23 DIAGNOSIS — D649 Anemia, unspecified: Secondary | ICD-10-CM | POA: Diagnosis not present

## 2022-04-23 DIAGNOSIS — E785 Hyperlipidemia, unspecified: Secondary | ICD-10-CM | POA: Diagnosis not present

## 2022-04-23 DIAGNOSIS — M9712XD Periprosthetic fracture around internal prosthetic left knee joint, subsequent encounter: Secondary | ICD-10-CM | POA: Diagnosis not present

## 2022-04-23 DIAGNOSIS — I272 Pulmonary hypertension, unspecified: Secondary | ICD-10-CM | POA: Diagnosis not present

## 2022-04-23 DIAGNOSIS — Z9181 History of falling: Secondary | ICD-10-CM | POA: Diagnosis not present

## 2022-04-23 DIAGNOSIS — E118 Type 2 diabetes mellitus with unspecified complications: Secondary | ICD-10-CM | POA: Diagnosis not present

## 2022-04-27 DIAGNOSIS — I5032 Chronic diastolic (congestive) heart failure: Secondary | ICD-10-CM | POA: Diagnosis not present

## 2022-04-27 DIAGNOSIS — M9712XD Periprosthetic fracture around internal prosthetic left knee joint, subsequent encounter: Secondary | ICD-10-CM | POA: Diagnosis not present

## 2022-04-27 DIAGNOSIS — Z9181 History of falling: Secondary | ICD-10-CM | POA: Diagnosis not present

## 2022-04-27 DIAGNOSIS — R2689 Other abnormalities of gait and mobility: Secondary | ICD-10-CM | POA: Diagnosis not present

## 2022-04-27 DIAGNOSIS — I272 Pulmonary hypertension, unspecified: Secondary | ICD-10-CM | POA: Diagnosis not present

## 2022-04-27 DIAGNOSIS — E118 Type 2 diabetes mellitus with unspecified complications: Secondary | ICD-10-CM | POA: Diagnosis not present

## 2022-04-27 DIAGNOSIS — D649 Anemia, unspecified: Secondary | ICD-10-CM | POA: Diagnosis not present

## 2022-04-27 DIAGNOSIS — E785 Hyperlipidemia, unspecified: Secondary | ICD-10-CM | POA: Diagnosis not present

## 2022-04-30 DIAGNOSIS — Z9181 History of falling: Secondary | ICD-10-CM | POA: Diagnosis not present

## 2022-04-30 DIAGNOSIS — R2689 Other abnormalities of gait and mobility: Secondary | ICD-10-CM | POA: Diagnosis not present

## 2022-04-30 DIAGNOSIS — E785 Hyperlipidemia, unspecified: Secondary | ICD-10-CM | POA: Diagnosis not present

## 2022-04-30 DIAGNOSIS — I5032 Chronic diastolic (congestive) heart failure: Secondary | ICD-10-CM | POA: Diagnosis not present

## 2022-04-30 DIAGNOSIS — M6281 Muscle weakness (generalized): Secondary | ICD-10-CM | POA: Diagnosis not present

## 2022-04-30 DIAGNOSIS — S72001A Fracture of unspecified part of neck of right femur, initial encounter for closed fracture: Secondary | ICD-10-CM | POA: Diagnosis not present

## 2022-04-30 DIAGNOSIS — I739 Peripheral vascular disease, unspecified: Secondary | ICD-10-CM | POA: Diagnosis not present

## 2022-04-30 DIAGNOSIS — E871 Hypo-osmolality and hyponatremia: Secondary | ICD-10-CM | POA: Diagnosis not present

## 2022-04-30 DIAGNOSIS — D638 Anemia in other chronic diseases classified elsewhere: Secondary | ICD-10-CM | POA: Diagnosis not present

## 2022-04-30 DIAGNOSIS — M9712XD Periprosthetic fracture around internal prosthetic left knee joint, subsequent encounter: Secondary | ICD-10-CM | POA: Diagnosis not present

## 2022-04-30 DIAGNOSIS — I272 Pulmonary hypertension, unspecified: Secondary | ICD-10-CM | POA: Diagnosis not present

## 2022-04-30 DIAGNOSIS — E1165 Type 2 diabetes mellitus with hyperglycemia: Secondary | ICD-10-CM | POA: Diagnosis not present

## 2022-04-30 DIAGNOSIS — R296 Repeated falls: Secondary | ICD-10-CM | POA: Diagnosis not present

## 2022-04-30 DIAGNOSIS — R300 Dysuria: Secondary | ICD-10-CM | POA: Diagnosis not present

## 2022-04-30 DIAGNOSIS — I270 Primary pulmonary hypertension: Secondary | ICD-10-CM | POA: Diagnosis not present

## 2022-04-30 DIAGNOSIS — I10 Essential (primary) hypertension: Secondary | ICD-10-CM | POA: Diagnosis not present

## 2022-04-30 DIAGNOSIS — D649 Anemia, unspecified: Secondary | ICD-10-CM | POA: Diagnosis not present

## 2022-04-30 DIAGNOSIS — I5033 Acute on chronic diastolic (congestive) heart failure: Secondary | ICD-10-CM | POA: Diagnosis not present

## 2022-04-30 DIAGNOSIS — M5459 Other low back pain: Secondary | ICD-10-CM | POA: Diagnosis not present

## 2022-04-30 DIAGNOSIS — E118 Type 2 diabetes mellitus with unspecified complications: Secondary | ICD-10-CM | POA: Diagnosis not present

## 2022-05-04 ENCOUNTER — Other Ambulatory Visit: Payer: Self-pay | Admitting: *Deleted

## 2022-05-04 DIAGNOSIS — Z9181 History of falling: Secondary | ICD-10-CM | POA: Diagnosis not present

## 2022-05-04 DIAGNOSIS — I5032 Chronic diastolic (congestive) heart failure: Secondary | ICD-10-CM | POA: Diagnosis not present

## 2022-05-04 DIAGNOSIS — M9712XD Periprosthetic fracture around internal prosthetic left knee joint, subsequent encounter: Secondary | ICD-10-CM | POA: Diagnosis not present

## 2022-05-04 DIAGNOSIS — D649 Anemia, unspecified: Secondary | ICD-10-CM | POA: Diagnosis not present

## 2022-05-04 DIAGNOSIS — E118 Type 2 diabetes mellitus with unspecified complications: Secondary | ICD-10-CM | POA: Diagnosis not present

## 2022-05-04 DIAGNOSIS — E785 Hyperlipidemia, unspecified: Secondary | ICD-10-CM | POA: Diagnosis not present

## 2022-05-04 DIAGNOSIS — I272 Pulmonary hypertension, unspecified: Secondary | ICD-10-CM | POA: Diagnosis not present

## 2022-05-04 DIAGNOSIS — R2689 Other abnormalities of gait and mobility: Secondary | ICD-10-CM | POA: Diagnosis not present

## 2022-05-04 NOTE — Patient Outreach (Addendum)
[place] Coordinator follow up. Mrs. Hounshell resides in [facility]. Screening for potential [facility] care coordination services as benefit of insurance plan and PCP.   Secure communication sent to SNF SW to inquire about transition plans and progress.   Addendum: Update from [place], SNF SW indicating Mrs. Barbary is progressing slowly with therapy. Plans to return home upon SNF discharge. Daughter Pamala Hurry is primary contact. She is a Pharmacist, hospital and best time to call is after 3 pm.   Will continue to follow and outreach daughter Pamala Hurry as appropriate.   Marthenia Rolling, MSN, RN,BSN [place] Acute Care Coordinator [phone] (Direct dial)

## 2022-05-07 DIAGNOSIS — R2689 Other abnormalities of gait and mobility: Secondary | ICD-10-CM | POA: Diagnosis not present

## 2022-05-07 DIAGNOSIS — E785 Hyperlipidemia, unspecified: Secondary | ICD-10-CM | POA: Diagnosis not present

## 2022-05-07 DIAGNOSIS — I5032 Chronic diastolic (congestive) heart failure: Secondary | ICD-10-CM | POA: Diagnosis not present

## 2022-05-07 DIAGNOSIS — Z9181 History of falling: Secondary | ICD-10-CM | POA: Diagnosis not present

## 2022-05-07 DIAGNOSIS — M9712XD Periprosthetic fracture around internal prosthetic left knee joint, subsequent encounter: Secondary | ICD-10-CM | POA: Diagnosis not present

## 2022-05-07 DIAGNOSIS — D649 Anemia, unspecified: Secondary | ICD-10-CM | POA: Diagnosis not present

## 2022-05-07 DIAGNOSIS — E118 Type 2 diabetes mellitus with unspecified complications: Secondary | ICD-10-CM | POA: Diagnosis not present

## 2022-05-07 DIAGNOSIS — I272 Pulmonary hypertension, unspecified: Secondary | ICD-10-CM | POA: Diagnosis not present

## 2022-05-10 ENCOUNTER — Other Ambulatory Visit: Payer: Self-pay | Admitting: *Deleted

## 2022-05-10 DIAGNOSIS — S72302D Unspecified fracture of shaft of left femur, subsequent encounter for closed fracture with routine healing: Secondary | ICD-10-CM | POA: Diagnosis not present

## 2022-05-10 DIAGNOSIS — M9712XD Periprosthetic fracture around internal prosthetic left knee joint, subsequent encounter: Secondary | ICD-10-CM | POA: Diagnosis not present

## 2022-05-10 NOTE — Patient Outreach (Addendum)
[place] Coordinator follow up. Mrs. Cruickshank resides in [facility]. Screening for [facility] care coordination services as benefit of insurance plan and PCP.   Telephone call made to daughter Noel Christmas [phone] to discuss [facility] services. However, no answer. HIPAA compliant voicemail message left requesting call back. Secure message sent to [facility] SW to make aware of writer's outreach attempt.     Addendum: Returned telephone call received from Noel Christmas (daughter). Pamala Hurry reports Mrs, Bottcher has had difficulty ambulating. States she thinks Mrs. Toweles had an appointment today. States she was not aware. States she has left voicemail for SW to inquire. Mrs. Deignan lives with Pamala Hurry and [employer] husband. The plan is to return home with them upon SNF discharge. Progress remains to be slow. Therefore, no dc date yet.   Discussed [facility] services. Will send [facility] email to include [facility] care management brochure attachment, [facility] website address, and writer's contact information.  Will continue to follow.   Marthenia Rolling, MSN, RN,BSN [place] Acute Care Coordinator [phone] (Direct dial)

## 2022-05-11 DIAGNOSIS — M6281 Muscle weakness (generalized): Secondary | ICD-10-CM | POA: Diagnosis not present

## 2022-05-11 DIAGNOSIS — M9712XD Periprosthetic fracture around internal prosthetic left knee joint, subsequent encounter: Secondary | ICD-10-CM | POA: Diagnosis not present

## 2022-05-11 DIAGNOSIS — E1165 Type 2 diabetes mellitus with hyperglycemia: Secondary | ICD-10-CM | POA: Diagnosis not present

## 2022-05-11 DIAGNOSIS — Z9181 History of falling: Secondary | ICD-10-CM | POA: Diagnosis not present

## 2022-05-11 DIAGNOSIS — S72392S Other fracture of shaft of left femur, sequela: Secondary | ICD-10-CM | POA: Diagnosis not present

## 2022-05-11 DIAGNOSIS — K5909 Other constipation: Secondary | ICD-10-CM | POA: Diagnosis not present

## 2022-05-11 DIAGNOSIS — E785 Hyperlipidemia, unspecified: Secondary | ICD-10-CM | POA: Diagnosis not present

## 2022-05-11 DIAGNOSIS — D649 Anemia, unspecified: Secondary | ICD-10-CM | POA: Diagnosis not present

## 2022-05-11 DIAGNOSIS — R2689 Other abnormalities of gait and mobility: Secondary | ICD-10-CM | POA: Diagnosis not present

## 2022-05-11 DIAGNOSIS — I5032 Chronic diastolic (congestive) heart failure: Secondary | ICD-10-CM | POA: Diagnosis not present

## 2022-05-11 DIAGNOSIS — R296 Repeated falls: Secondary | ICD-10-CM | POA: Diagnosis not present

## 2022-05-11 DIAGNOSIS — I10 Essential (primary) hypertension: Secondary | ICD-10-CM | POA: Diagnosis not present

## 2022-05-11 DIAGNOSIS — I272 Pulmonary hypertension, unspecified: Secondary | ICD-10-CM | POA: Diagnosis not present

## 2022-05-11 DIAGNOSIS — E118 Type 2 diabetes mellitus with unspecified complications: Secondary | ICD-10-CM | POA: Diagnosis not present

## 2022-05-12 DIAGNOSIS — Z0000 Encounter for general adult medical examination without abnormal findings: Secondary | ICD-10-CM | POA: Diagnosis not present

## 2022-05-14 DIAGNOSIS — E118 Type 2 diabetes mellitus with unspecified complications: Secondary | ICD-10-CM | POA: Diagnosis not present

## 2022-05-14 DIAGNOSIS — R2689 Other abnormalities of gait and mobility: Secondary | ICD-10-CM | POA: Diagnosis not present

## 2022-05-14 DIAGNOSIS — I5032 Chronic diastolic (congestive) heart failure: Secondary | ICD-10-CM | POA: Diagnosis not present

## 2022-05-14 DIAGNOSIS — M9712XD Periprosthetic fracture around internal prosthetic left knee joint, subsequent encounter: Secondary | ICD-10-CM | POA: Diagnosis not present

## 2022-05-14 DIAGNOSIS — E785 Hyperlipidemia, unspecified: Secondary | ICD-10-CM | POA: Diagnosis not present

## 2022-05-14 DIAGNOSIS — D649 Anemia, unspecified: Secondary | ICD-10-CM | POA: Diagnosis not present

## 2022-05-14 DIAGNOSIS — I272 Pulmonary hypertension, unspecified: Secondary | ICD-10-CM | POA: Diagnosis not present

## 2022-05-14 DIAGNOSIS — Z9181 History of falling: Secondary | ICD-10-CM | POA: Diagnosis not present

## 2022-05-18 DIAGNOSIS — E785 Hyperlipidemia, unspecified: Secondary | ICD-10-CM | POA: Diagnosis not present

## 2022-05-18 DIAGNOSIS — D649 Anemia, unspecified: Secondary | ICD-10-CM | POA: Diagnosis not present

## 2022-05-18 DIAGNOSIS — I5032 Chronic diastolic (congestive) heart failure: Secondary | ICD-10-CM | POA: Diagnosis not present

## 2022-05-18 DIAGNOSIS — I272 Pulmonary hypertension, unspecified: Secondary | ICD-10-CM | POA: Diagnosis not present

## 2022-05-18 DIAGNOSIS — E118 Type 2 diabetes mellitus with unspecified complications: Secondary | ICD-10-CM | POA: Diagnosis not present

## 2022-05-18 DIAGNOSIS — Z9181 History of falling: Secondary | ICD-10-CM | POA: Diagnosis not present

## 2022-05-18 DIAGNOSIS — R2689 Other abnormalities of gait and mobility: Secondary | ICD-10-CM | POA: Diagnosis not present

## 2022-05-18 DIAGNOSIS — M9712XD Periprosthetic fracture around internal prosthetic left knee joint, subsequent encounter: Secondary | ICD-10-CM | POA: Diagnosis not present

## 2022-05-21 DIAGNOSIS — E118 Type 2 diabetes mellitus with unspecified complications: Secondary | ICD-10-CM | POA: Diagnosis not present

## 2022-05-21 DIAGNOSIS — M9712XD Periprosthetic fracture around internal prosthetic left knee joint, subsequent encounter: Secondary | ICD-10-CM | POA: Diagnosis not present

## 2022-05-21 DIAGNOSIS — R2689 Other abnormalities of gait and mobility: Secondary | ICD-10-CM | POA: Diagnosis not present

## 2022-05-21 DIAGNOSIS — N938 Other specified abnormal uterine and vaginal bleeding: Secondary | ICD-10-CM | POA: Diagnosis not present

## 2022-05-21 DIAGNOSIS — E785 Hyperlipidemia, unspecified: Secondary | ICD-10-CM | POA: Diagnosis not present

## 2022-05-21 DIAGNOSIS — I5032 Chronic diastolic (congestive) heart failure: Secondary | ICD-10-CM | POA: Diagnosis not present

## 2022-05-21 DIAGNOSIS — I272 Pulmonary hypertension, unspecified: Secondary | ICD-10-CM | POA: Diagnosis not present

## 2022-05-21 DIAGNOSIS — D649 Anemia, unspecified: Secondary | ICD-10-CM | POA: Diagnosis not present

## 2022-05-21 DIAGNOSIS — Z9181 History of falling: Secondary | ICD-10-CM | POA: Diagnosis not present

## 2022-05-23 ENCOUNTER — Encounter (HOSPITAL_COMMUNITY): Payer: Self-pay

## 2022-05-23 ENCOUNTER — Other Ambulatory Visit: Payer: Self-pay

## 2022-05-23 ENCOUNTER — Other Ambulatory Visit (HOSPITAL_COMMUNITY): Payer: Medicare Other

## 2022-05-23 ENCOUNTER — Emergency Department (HOSPITAL_COMMUNITY)
Admission: EM | Admit: 2022-05-23 | Discharge: 2022-05-24 | Disposition: A | Discharge status: Home / Self Care | Payer: Medicare Other | Attending: Emergency Medicine | Admitting: Emergency Medicine

## 2022-05-23 ENCOUNTER — Emergency Department (HOSPITAL_COMMUNITY): Payer: Medicare Other

## 2022-05-23 DIAGNOSIS — K5730 Diverticulosis of large intestine without perforation or abscess without bleeding: Secondary | ICD-10-CM | POA: Diagnosis not present

## 2022-05-23 DIAGNOSIS — K5900 Constipation, unspecified: Secondary | ICD-10-CM | POA: Insufficient documentation

## 2022-05-23 DIAGNOSIS — E119 Type 2 diabetes mellitus without complications: Secondary | ICD-10-CM | POA: Insufficient documentation

## 2022-05-23 DIAGNOSIS — N3000 Acute cystitis without hematuria: Secondary | ICD-10-CM | POA: Diagnosis not present

## 2022-05-23 DIAGNOSIS — N938 Other specified abnormal uterine and vaginal bleeding: Secondary | ICD-10-CM | POA: Diagnosis present

## 2022-05-23 DIAGNOSIS — Z7984 Long term (current) use of oral hypoglycemic drugs: Secondary | ICD-10-CM | POA: Diagnosis not present

## 2022-05-23 DIAGNOSIS — Z9889 Other specified postprocedural states: Secondary | ICD-10-CM | POA: Diagnosis not present

## 2022-05-23 DIAGNOSIS — I10 Essential (primary) hypertension: Secondary | ICD-10-CM | POA: Diagnosis not present

## 2022-05-23 DIAGNOSIS — N3289 Other specified disorders of bladder: Secondary | ICD-10-CM | POA: Diagnosis not present

## 2022-05-23 LAB — LIPASE, BLOOD: Lipase: 32 U/L (ref 11–51)

## 2022-05-23 LAB — COMPREHENSIVE METABOLIC PANEL
ALT: 13 U/L (ref 0–44)
AST: 19 U/L (ref 15–41)
Albumin: 2.8 g/dL — ABNORMAL LOW (ref 3.5–5.0)
Alkaline Phosphatase: 77 U/L (ref 38–126)
Anion gap: 8 (ref 5–15)
BUN: 12 mg/dL (ref 8–23)
CO2: 30 mmol/L (ref 22–32)
Calcium: 9.5 mg/dL (ref 8.9–10.3)
Chloride: 100 mmol/L (ref 98–111)
Creatinine, Ser: 0.52 mg/dL (ref 0.44–1.00)
GFR, Estimated: >60 mL/min (ref >60)
Glucose, Bld: 208 mg/dL — ABNORMAL HIGH (ref 70–99)
Potassium: 5 mmol/L (ref 3.5–5.1)
Sodium: 138 mmol/L (ref 135–145)
Total Bilirubin: 0.4 mg/dL (ref 0.3–1.2)
Total Protein: 6.2 g/dL — ABNORMAL LOW (ref 6.5–8.1)

## 2022-05-23 LAB — CBC WITH DIFFERENTIAL/PLATELET
Abs Immature Granulocytes: 0.01 10*3/uL (ref 0.00–0.07)
Basophils Absolute: 0 10*3/uL (ref 0.0–0.1)
Basophils Relative: 1 %
Eosinophils Absolute: 0.2 10*3/uL (ref 0.0–0.5)
Eosinophils Relative: 3 %
HCT: 38.4 % (ref 36.0–46.0)
Hemoglobin: 11.9 g/dL — ABNORMAL LOW (ref 12.0–15.0)
Immature Granulocytes: 0 %
Lymphocytes Relative: 38 %
Lymphs Abs: 2.2 10*3/uL (ref 0.7–4.0)
MCH: 27.9 pg (ref 26.0–34.0)
MCHC: 31 g/dL (ref 30.0–36.0)
MCV: 90.1 fL (ref 80.0–100.0)
Monocytes Absolute: 0.7 10*3/uL (ref 0.1–1.0)
Monocytes Relative: 11 %
Neutro Abs: 2.8 10*3/uL (ref 1.7–7.7)
Neutrophils Relative %: 47 %
Platelets: 183 10*3/uL (ref 150–400)
RBC: 4.26 MIL/uL (ref 3.87–5.11)
RDW: 12.8 % (ref 11.5–15.5)
WBC: 5.9 10*3/uL (ref 4.0–10.5)
nRBC: 0 % (ref 0.0–0.2)

## 2022-05-23 MED ORDER — IOHEXOL 350 MG/ML SOLN
80.0000 mL | Freq: Once | INTRAVENOUS | Status: AC | PRN
  Administered 2022-05-23: 80 mL via INTRAVENOUS

## 2022-05-23 NOTE — ED Provider Triage Note (Signed)
Emergency Medicine Provider Triage Evaluation Note  MELAYAH SKORUPSKI , a 88 y.o. female  was evaluated in triage.  Patient arrives from [facility] for evaluation of vaginal bleeding ongoing x1 day.  Denies similar in the past.  Reports associated abdominal pain.  No other concerns.  Review of Systems  Positive: Vaginal bleeding, abdominal pain Negative: Fever, chills, chest pain/shortness of breath, fall, injury or any additional concerns  Physical Exam  BP 120/81 (BP Location: Left Arm)   Pulse 82   Temp 99.4 F (37.4 C) (Oral)   Resp 17   SpO2 98%  Gen:   Awake, no distress   Resp:  Normal effort  MSK:   Moves extremities without difficulty  Other:  Diffusely tender abdomen without peritoneal signs  Medical Decision Making  Medically screening exam initiated at 3:10 PM.  Appropriate orders placed.  MYRIA STEENBERGEN was informed that the remainder of the evaluation will be completed by another provider, this initial triage assessment does not replace that evaluation, and the importance of remaining in the ED until their evaluation is complete.    Note: Portions of this report may have been transcribed using voice recognition software. Every effort was made to ensure accuracy; however, inadvertent computerized transcription errors may still be present.    Deliah Boston, PA-C 05/23/22 1511

## 2022-05-23 NOTE — ED Triage Notes (Signed)
Pt BIB PTAR from [facility] stating the pt is having heavy bleeding from her vagina. Pt denies any dizziness or light headedness or pain.

## 2022-05-24 DIAGNOSIS — N3000 Acute cystitis without hematuria: Secondary | ICD-10-CM | POA: Diagnosis not present

## 2022-05-24 LAB — URINALYSIS, ROUTINE W REFLEX MICROSCOPIC
Bilirubin Urine: NEGATIVE
Glucose, UA: NEGATIVE mg/dL
Hgb urine dipstick: NEGATIVE
Ketones, ur: NEGATIVE mg/dL
Nitrite: NEGATIVE
Protein, ur: >=300 mg/dL — AB
Specific Gravity, Urine: 1.034 — ABNORMAL HIGH (ref 1.005–1.030)
pH: 8 (ref 5.0–8.0)

## 2022-05-24 LAB — POC OCCULT BLOOD, ED: Fecal Occult Bld: NEGATIVE

## 2022-05-24 MED ORDER — SENNOSIDES-DOCUSATE SODIUM 8.6-50 MG PO TABS: 1.0000 | ORAL_TABLET | Freq: Every evening | ORAL | 0 refills | Status: DC | PRN

## 2022-05-24 MED ORDER — CEPHALEXIN 250 MG PO CAPS
500.0000 mg | ORAL_CAPSULE | Freq: Once | ORAL | Status: AC
  Administered 2022-05-24: 500 mg via ORAL
  Filled 2022-05-24: qty 2

## 2022-05-24 MED ORDER — CEPHALEXIN 500 MG PO CAPS: 500.0000 mg | ORAL_CAPSULE | Freq: Two times a day (BID) | ORAL | 0 refills | Status: AC

## 2022-05-24 NOTE — ED Notes (Signed)
Ptar update pt is next to be picked up

## 2022-05-24 NOTE — ED Notes (Signed)
GAVE PATIENT SOME WATER TO DRINK

## 2022-05-24 NOTE — ED Notes (Addendum)
Per MD Long patient allowed to drink water to see if she can give urine sample.  If not possible, then in/out cath as an option.  NT [place] aware as well.

## 2022-05-24 NOTE — ED Notes (Addendum)
Pt came in from rehab facility [facility] due to what appeared to be vaginal bleeding.  Pt in rehab facility due to left femur fracture that occurred in September of 2023 per pt and daughter report.

## 2022-05-24 NOTE — ED Provider Notes (Signed)
Emergency Department Provider Note   I have reviewed the triage vital signs and the nursing notes.   HISTORY  Chief Complaint Vaginal Bleeding   HPI Toni [name] is a 88 y.o. female with PMH of DM, HTN, peripheral vascular disease, and s/p left femur fracture s/p repair (03/25/22) presents to the ED with staff report of "blood from below" yesterday.  Patient had been on Xarelto until 3 days ago for DVT prophylaxis following her surgery.  Staff at [facility] noticed blood in the pad which was worse yesterday and ultimately prompted ED evaluation.  Patient states she continues to have bowel movements but has often struggled with constipation.  She denies constipation worse than normal.  No fevers or chills.  No burning with urination.  She has a prior history of hysterectomy.  Patient is unclear if the blood is vaginal, rectal, other source. She has noticed some vaginal irritation and itching.    Past Medical History:  Diagnosis Date   Diabetes mellitus without complication (HCC)    Hypertension    Peripheral vascular disease ([place])    Plantar fascial fibromatosis     Review of Systems  Constitutional: No fever/chills Cardiovascular: Denies chest pain. Respiratory: Denies shortness of breath. Gastrointestinal: No abdominal pain.  No nausea, no vomiting.  No diarrhea. Positive constipation. ? Rectal vs vaginal bleeding.  Genitourinary: Negative for dysuria. Positive vaginal itching.  Musculoskeletal: Positive left leg pain in post-op setting although not suddenly worsening.  Skin: Negative for rash. Neurological: Negative for headaches.  ____________________________________________   PHYSICAL EXAM:  VITAL SIGNS: ED Triage Vitals  Enc Vitals Group     BP 05/23/22 1407 120/81     Pulse Rate 05/23/22 1407 82     Resp 05/23/22 1407 17     Temp 05/23/22 1407 99.4 F (37.4 C)     Temp Source 05/23/22 1407 Oral     SpO2 05/23/22 1358 98 %   Constitutional: Alert and  oriented. Well appearing and in no acute distress. Eyes: Conjunctivae are normal.  Head: Atraumatic. Nose: No congestion/rhinnorhea. Mouth/Throat: Mucous membranes are moist.  Neck: No stridor.  Cardiovascular: Normal rate, regular rhythm. Good peripheral circulation. Grossly normal heart sounds.   Respiratory: Normal respiratory effort.  No retractions. Lungs CTAB. Gastrointestinal: Soft and nontender. No distention. Exam performed with patient's verbal consent and nurse chaperone.  No perirectal erythema, swelling, tenderness, bleeding.  No fissure.  Stool in the rectal vault but no appreciable impaction.  Genitourinary: Exam performed with patient's verbal consent and nurse chaperone.  Patient has no skin breakdown, abscess, active bleeding.  Mild candidal type discharge but no appreciable vaginal bleeding.  Due to mobility issues patient was unable to have an exam with speculum and in stirrups. Musculoskeletal: Post operative site (left leg) is well appearing and without erythema or significant edema.  Neurologic:  Normal speech and language. No gross focal neurologic deficits are appreciated.  Skin:  Skin is warm, dry and intact. No rash noted.  ____________________________________________   LABS (all labs ordered are listed, but only abnormal results are displayed)  Labs Reviewed  CBC WITH DIFFERENTIAL/PLATELET - Abnormal; Notable for the following components:      Result Value   Hemoglobin 11.9 (*)    All other components within normal limits  COMPREHENSIVE METABOLIC PANEL - Abnormal; Notable for the following components:   Glucose, Bld 208 (*)    Total Protein 6.2 (*)    Albumin 2.8 (*)    All other components within  normal limits  URINALYSIS, ROUTINE W REFLEX MICROSCOPIC - Abnormal; Notable for the following components:   Color, Urine AMBER (*)    APPearance CLOUDY (*)    Specific Gravity, Urine 1.034 (*)    Protein, ur >=300 (*)    Leukocytes,Ua LARGE (*)    Bacteria, UA  MANY (*)    All other components within normal limits  URINE CULTURE  LIPASE, BLOOD  POC OCCULT BLOOD, ED   ____________________________________________  RADIOLOGY  No results found.  ____________________________________________   PROCEDURES  Procedure(s) performed:   Procedures  None  ____________________________________________   INITIAL IMPRESSION / ASSESSMENT AND PLAN / ED COURSE  Pertinent labs & imaging results that were available during my care of the patient were reviewed by me and considered in my medical decision making (see chart for details).   This patient is Presenting for Evaluation of bleeding, which does require a range of treatment options, and is a complaint that involves a high risk of morbidity and mortality.  The Differential Diagnoses include rectal bleeding, vaginal bleeding, UTI/hematuria, skin breakdown/ulceration, response to anticoagulation, etc.  Critical Interventions-    Medications  iohexol (OMNIPAQUE) 350 MG/ML injection 80 mL (80 mLs Intravenous Contrast Given 05/23/22 1841)  cephALEXin (KEFLEX) capsule 500 mg (500 mg Oral Given 05/24/22 1506)    Reassessment after intervention: Patient continues to be comfortable without active bleeding.    I did obtain Additional Historical Information from daughter at bedside.  I decided to review pertinent External Data, and in summary patient s/p ORIF of femure fracture (Dr. Marcelino Scot). MAR form [facility] shows patient was on Xarelto for DVT prophylaxis until 11/3 which is when the order was set to expire.    Clinical Laboratory Tests Ordered, included no acute kidney injury or electrolyte disturbance.  Hyperglycemia without DKA.  Hemoglobin 11.9 improved from baseline around 9.  No leukocytosis.  Fecal occult negative.  Radiologic Tests Ordered, included CT abdomen/pelvis. I independently interpreted the images and agree with radiology interpretation.   Cardiac Monitor Tracing which shows NSR.     Social Determinants of Health Risk patient is a non-smoker.   Medical Decision Making: Summary:  Presents emergency department with concern for bleeding but unclear source.  Differential includes vaginal bleeding but patient has history of abdominal hysterectomy and no uterus is visualized on abdominal CT.  Do not plan for pelvic ultrasound in the setting.  No clear source of bleeding on my exam either vaginal or rectal.  She does not appear impacted clinically.  We will increase her bowel regimen at home but bleeding does not appear to be rectal at this time.  She did recently complete a course of anticoagulation for DVT prophylaxis but has since discontinued this medication. Unclear if Xarelto was causing some breakthrough bleeding toward the end or this course or not. No hemodynamic instability here. Patient awake, alert, and looking well overall.   Reevaluation with update and discussion with patient and family. Will cover for UTI and send home with medication for constipation. Discussed close PCP follow up and ED return precautions.   Considered admit but patient is feeling well, no longer anticoagulated, and without additional bleeding here. Plan for close outpatient follow up.   Disposition: discharge  ____________________________________________  FINAL CLINICAL IMPRESSION(S) / ED DIAGNOSES  Final diagnoses:  Acute cystitis without hematuria  Constipation, unspecified constipation type     NEW OUTPATIENT MEDICATIONS STARTED DURING THIS VISIT:  Discharge Medication List as of 05/24/2022  3:01 PM  START taking these medications   Details  cephALEXin (KEFLEX) 500 MG capsule Take 1 capsule (500 mg total) by mouth 2 (two) times daily for 7 days., Starting Mon 05/24/2022, Until Mon 05/31/2022, Normal        Note:  This document was prepared using Dragon voice recognition software and may include unintentional dictation errors.  Nanda Quinton, MD, [facility] Emergency [facility]     Nessa Ramaker, [facility], MD 05/25/22 [phone]

## 2022-05-24 NOTE — Discharge Instructions (Signed)
You were seen in the emergency room today with concern for bleeding.  I did not find any source of bleeding but I am treating you for urinary tract infection.  You show signs of constipation on your CT scan as well and I am starting you on additional medicine to help with this.  You may also consider glycerin or other suppositories or possible enemas if needed.  Please follow closely with your primary care physician.  Return with any new or suddenly worsening symptoms.

## 2022-05-24 NOTE — ED Notes (Signed)
[facility] called for transport.  

## 2022-05-25 DIAGNOSIS — I5032 Chronic diastolic (congestive) heart failure: Secondary | ICD-10-CM | POA: Diagnosis not present

## 2022-05-25 DIAGNOSIS — I272 Pulmonary hypertension, unspecified: Secondary | ICD-10-CM | POA: Diagnosis not present

## 2022-05-25 DIAGNOSIS — E118 Type 2 diabetes mellitus with unspecified complications: Secondary | ICD-10-CM | POA: Diagnosis not present

## 2022-05-25 DIAGNOSIS — Z9181 History of falling: Secondary | ICD-10-CM | POA: Diagnosis not present

## 2022-05-25 DIAGNOSIS — D649 Anemia, unspecified: Secondary | ICD-10-CM | POA: Diagnosis not present

## 2022-05-25 DIAGNOSIS — M9712XD Periprosthetic fracture around internal prosthetic left knee joint, subsequent encounter: Secondary | ICD-10-CM | POA: Diagnosis not present

## 2022-05-25 DIAGNOSIS — R2689 Other abnormalities of gait and mobility: Secondary | ICD-10-CM | POA: Diagnosis not present

## 2022-05-25 DIAGNOSIS — E785 Hyperlipidemia, unspecified: Secondary | ICD-10-CM | POA: Diagnosis not present

## 2022-05-26 DIAGNOSIS — N390 Urinary tract infection, site not specified: Secondary | ICD-10-CM | POA: Diagnosis not present

## 2022-05-26 DIAGNOSIS — N938 Other specified abnormal uterine and vaginal bleeding: Secondary | ICD-10-CM | POA: Diagnosis not present

## 2022-05-26 LAB — URINE CULTURE: Culture: >=100000 — AB

## 2022-05-27 ENCOUNTER — Telehealth (HOSPITAL_BASED_OUTPATIENT_CLINIC_OR_DEPARTMENT_OTHER): Payer: Self-pay | Admitting: *Deleted

## 2022-05-27 ENCOUNTER — Other Ambulatory Visit: Payer: Self-pay | Admitting: *Deleted

## 2022-05-27 NOTE — Patient Outreach (Signed)
[place] Coordinator follow up. Per [facility], [name] resides in [facility]. Screening for potential [facility] care coordination services as benefit of insurance plan and PCP.  Secure communication sent to SNF social worker to request update.  Will continue to follow.   Marthenia Rolling, MSN, RN,BSN [place] Acute Care Coordinator [phone] (Direct dial)

## 2022-05-27 NOTE — Telephone Encounter (Signed)
Post ED Visit - Positive Culture Follow-up  Culture report reviewed by antimicrobial stewardship pharmacist: [place] Team '[]'$  Nathan Batchelder, Pharm.D. '[]'$  [place], Pharm.D., BCPS AQ-ID '[]'$  Heide Guile, Pharm.D., BCPS '[]'$  Parks Neptune, Pharm.D., BCPS '[]'$  [place].D., BCPS, AAHIVP '[]'$  [place], Pharm.D., BCPS, AAHIVP '[]'$  Legrand Como, PharmD, BCPS '[]'$  Salome Arnt, PharmD, BCPS '[]'$  Johnnette Gourd, PharmD, BCPS '[]'$  Hughes Better, PharmD '[]'$  Leeroy Cha, PharmD, BCPS '[]'$  Laqueta Linden, PharmD  [place] Team '[]'$  [address], PharmD '[]'$  Leodis Sias, PharmD '[]'$  Lindell Spar, PharmD '[]'$  Royetta Asal, Rph '[]'$  Graylin Shiver) Rema Fendt, PharmD '[]'$  Glennon Mac, PharmD '[]'$  Arlyn Dunning, PharmD '[]'$  Netta Cedars, PharmD '[]'$  Dia Sitter, PharmD '[]'$  Leone Haven, PharmD '[]'$  Gretta Arab, PharmD '[]'$  Theodis Shove, PharmD '[]'$  Peggyann Juba, PharmD   Positive urine culture Treated with Cephalexin, organism sensitive to the same and no further patient follow-up is required at this time. Reuel Boom, PharmD  Jeneen Rinks [place] 05/27/2022, 11:11 AM

## 2022-06-01 DIAGNOSIS — Z9181 History of falling: Secondary | ICD-10-CM | POA: Diagnosis not present

## 2022-06-01 DIAGNOSIS — I5032 Chronic diastolic (congestive) heart failure: Secondary | ICD-10-CM | POA: Diagnosis not present

## 2022-06-01 DIAGNOSIS — I272 Pulmonary hypertension, unspecified: Secondary | ICD-10-CM | POA: Diagnosis not present

## 2022-06-01 DIAGNOSIS — E785 Hyperlipidemia, unspecified: Secondary | ICD-10-CM | POA: Diagnosis not present

## 2022-06-01 DIAGNOSIS — E118 Type 2 diabetes mellitus with unspecified complications: Secondary | ICD-10-CM | POA: Diagnosis not present

## 2022-06-01 DIAGNOSIS — R2689 Other abnormalities of gait and mobility: Secondary | ICD-10-CM | POA: Diagnosis not present

## 2022-06-01 DIAGNOSIS — M9712XD Periprosthetic fracture around internal prosthetic left knee joint, subsequent encounter: Secondary | ICD-10-CM | POA: Diagnosis not present

## 2022-06-01 DIAGNOSIS — D649 Anemia, unspecified: Secondary | ICD-10-CM | POA: Diagnosis not present

## 2022-06-04 ENCOUNTER — Other Ambulatory Visit: Payer: Self-pay | Admitting: *Deleted

## 2022-06-04 DIAGNOSIS — Z9181 History of falling: Secondary | ICD-10-CM | POA: Diagnosis not present

## 2022-06-04 DIAGNOSIS — I5032 Chronic diastolic (congestive) heart failure: Secondary | ICD-10-CM | POA: Diagnosis not present

## 2022-06-04 DIAGNOSIS — E118 Type 2 diabetes mellitus with unspecified complications: Secondary | ICD-10-CM | POA: Diagnosis not present

## 2022-06-04 DIAGNOSIS — I272 Pulmonary hypertension, unspecified: Secondary | ICD-10-CM | POA: Diagnosis not present

## 2022-06-04 DIAGNOSIS — E785 Hyperlipidemia, unspecified: Secondary | ICD-10-CM | POA: Diagnosis not present

## 2022-06-04 DIAGNOSIS — M9712XD Periprosthetic fracture around internal prosthetic left knee joint, subsequent encounter: Secondary | ICD-10-CM | POA: Diagnosis not present

## 2022-06-04 DIAGNOSIS — D649 Anemia, unspecified: Secondary | ICD-10-CM | POA: Diagnosis not present

## 2022-06-04 DIAGNOSIS — R2689 Other abnormalities of gait and mobility: Secondary | ICD-10-CM | POA: Diagnosis not present

## 2022-06-04 NOTE — Patient Outreach (Signed)
[place] Coordinator follow up. Mrs. Toni [name] resides in [facility]. Screening for potential [facility] care coordination services as benefit of insurance plan and PCP.  Update received from [place], SNF social worker. Mrs. Toni [name] continues to make slow progress. Plans to return home post SNF.   Will continue to follow.   Marthenia Rolling, MSN, RN,BSN [place] Acute Care Coordinator [phone] (Direct dial)

## 2022-06-07 DIAGNOSIS — R051 Acute cough: Secondary | ICD-10-CM | POA: Diagnosis not present

## 2022-06-07 DIAGNOSIS — Z8616 Personal history of COVID-19: Secondary | ICD-10-CM | POA: Diagnosis not present

## 2022-06-08 DIAGNOSIS — Z9181 History of falling: Secondary | ICD-10-CM | POA: Diagnosis not present

## 2022-06-08 DIAGNOSIS — I5032 Chronic diastolic (congestive) heart failure: Secondary | ICD-10-CM | POA: Diagnosis not present

## 2022-06-08 DIAGNOSIS — E118 Type 2 diabetes mellitus with unspecified complications: Secondary | ICD-10-CM | POA: Diagnosis not present

## 2022-06-08 DIAGNOSIS — R2689 Other abnormalities of gait and mobility: Secondary | ICD-10-CM | POA: Diagnosis not present

## 2022-06-08 DIAGNOSIS — L988 Other specified disorders of the skin and subcutaneous tissue: Secondary | ICD-10-CM | POA: Diagnosis not present

## 2022-06-08 DIAGNOSIS — M9712XD Periprosthetic fracture around internal prosthetic left knee joint, subsequent encounter: Secondary | ICD-10-CM | POA: Diagnosis not present

## 2022-06-08 DIAGNOSIS — D649 Anemia, unspecified: Secondary | ICD-10-CM | POA: Diagnosis not present

## 2022-06-08 DIAGNOSIS — I272 Pulmonary hypertension, unspecified: Secondary | ICD-10-CM | POA: Diagnosis not present

## 2022-06-08 DIAGNOSIS — E785 Hyperlipidemia, unspecified: Secondary | ICD-10-CM | POA: Diagnosis not present

## 2022-06-09 DIAGNOSIS — M6281 Muscle weakness (generalized): Secondary | ICD-10-CM | POA: Diagnosis not present

## 2022-06-09 DIAGNOSIS — E1165 Type 2 diabetes mellitus with hyperglycemia: Secondary | ICD-10-CM | POA: Diagnosis not present

## 2022-06-09 DIAGNOSIS — R296 Repeated falls: Secondary | ICD-10-CM | POA: Diagnosis not present

## 2022-06-09 DIAGNOSIS — I270 Primary pulmonary hypertension: Secondary | ICD-10-CM | POA: Diagnosis not present

## 2022-06-09 DIAGNOSIS — E871 Hypo-osmolality and hyponatremia: Secondary | ICD-10-CM | POA: Diagnosis not present

## 2022-06-09 DIAGNOSIS — I739 Peripheral vascular disease, unspecified: Secondary | ICD-10-CM | POA: Diagnosis not present

## 2022-06-09 DIAGNOSIS — I5032 Chronic diastolic (congestive) heart failure: Secondary | ICD-10-CM | POA: Diagnosis not present

## 2022-06-09 DIAGNOSIS — I10 Essential (primary) hypertension: Secondary | ICD-10-CM | POA: Diagnosis not present

## 2022-06-09 DIAGNOSIS — D638 Anemia in other chronic diseases classified elsewhere: Secondary | ICD-10-CM | POA: Diagnosis not present

## 2022-06-09 DIAGNOSIS — K5909 Other constipation: Secondary | ICD-10-CM | POA: Diagnosis not present

## 2022-06-09 DIAGNOSIS — S72392S Other fracture of shaft of left femur, sequela: Secondary | ICD-10-CM | POA: Diagnosis not present

## 2022-06-09 DIAGNOSIS — I5033 Acute on chronic diastolic (congestive) heart failure: Secondary | ICD-10-CM | POA: Diagnosis not present

## 2022-06-11 ENCOUNTER — Other Ambulatory Visit: Payer: Self-pay

## 2022-06-11 ENCOUNTER — Emergency Department (HOSPITAL_COMMUNITY)
Admission: EM | Admit: 2022-06-11 | Discharge: 2022-06-11 | Disposition: A | Discharge status: Skilled Nursing Facility | Payer: Medicare Other | Attending: Student | Admitting: Student

## 2022-06-11 ENCOUNTER — Emergency Department (HOSPITAL_COMMUNITY): Payer: Medicare Other

## 2022-06-11 DIAGNOSIS — L818 Other specified disorders of pigmentation: Secondary | ICD-10-CM | POA: Insufficient documentation

## 2022-06-11 DIAGNOSIS — R310 Gross hematuria: Secondary | ICD-10-CM | POA: Insufficient documentation

## 2022-06-11 DIAGNOSIS — E119 Type 2 diabetes mellitus without complications: Secondary | ICD-10-CM | POA: Insufficient documentation

## 2022-06-11 DIAGNOSIS — I110 Hypertensive heart disease with heart failure: Secondary | ICD-10-CM | POA: Insufficient documentation

## 2022-06-11 DIAGNOSIS — K7460 Unspecified cirrhosis of liver: Secondary | ICD-10-CM | POA: Diagnosis not present

## 2022-06-11 DIAGNOSIS — Z79899 Other long term (current) drug therapy: Secondary | ICD-10-CM | POA: Insufficient documentation

## 2022-06-11 DIAGNOSIS — N938 Other specified abnormal uterine and vaginal bleeding: Secondary | ICD-10-CM | POA: Diagnosis not present

## 2022-06-11 DIAGNOSIS — I509 Heart failure, unspecified: Secondary | ICD-10-CM | POA: Diagnosis not present

## 2022-06-11 DIAGNOSIS — Z7982 Long term (current) use of aspirin: Secondary | ICD-10-CM | POA: Diagnosis not present

## 2022-06-11 DIAGNOSIS — Z7901 Long term (current) use of anticoagulants: Secondary | ICD-10-CM | POA: Insufficient documentation

## 2022-06-11 DIAGNOSIS — K7689 Other specified diseases of liver: Secondary | ICD-10-CM | POA: Diagnosis not present

## 2022-06-11 DIAGNOSIS — R319 Hematuria, unspecified: Secondary | ICD-10-CM | POA: Diagnosis present

## 2022-06-11 DIAGNOSIS — N281 Cyst of kidney, acquired: Secondary | ICD-10-CM | POA: Diagnosis not present

## 2022-06-11 LAB — CBC WITH DIFFERENTIAL/PLATELET
Abs Immature Granulocytes: 0.04 10*3/uL (ref 0.00–0.07)
Basophils Absolute: 0 10*3/uL (ref 0.0–0.1)
Basophils Relative: 0 %
Eosinophils Absolute: 0.1 10*3/uL (ref 0.0–0.5)
Eosinophils Relative: 1 %
HCT: 40.5 % (ref 36.0–46.0)
Hemoglobin: 12.4 g/dL (ref 12.0–15.0)
Immature Granulocytes: 1 %
Lymphocytes Relative: 30 %
Lymphs Abs: 2.3 10*3/uL (ref 0.7–4.0)
MCH: 27.5 pg (ref 26.0–34.0)
MCHC: 30.6 g/dL (ref 30.0–36.0)
MCV: 89.8 fL (ref 80.0–100.0)
Monocytes Absolute: 0.8 10*3/uL (ref 0.1–1.0)
Monocytes Relative: 11 %
Neutro Abs: 4.5 10*3/uL (ref 1.7–7.7)
Neutrophils Relative %: 57 %
Platelets: 251 10*3/uL (ref 150–400)
RBC: 4.51 MIL/uL (ref 3.87–5.11)
RDW: 12.8 % (ref 11.5–15.5)
WBC: 7.8 10*3/uL (ref 4.0–10.5)
nRBC: 0 % (ref 0.0–0.2)

## 2022-06-11 LAB — URINALYSIS, ROUTINE W REFLEX MICROSCOPIC
RBC / HPF: >50 RBC/hpf — ABNORMAL HIGH (ref 0–5)
WBC, UA: >50 WBC/hpf — ABNORMAL HIGH (ref 0–5)

## 2022-06-11 LAB — COMPREHENSIVE METABOLIC PANEL
ALT: 12 U/L (ref 0–44)
AST: 23 U/L (ref 15–41)
Albumin: 2.9 g/dL — ABNORMAL LOW (ref 3.5–5.0)
Alkaline Phosphatase: 62 U/L (ref 38–126)
Anion gap: 4 — ABNORMAL LOW (ref 5–15)
BUN: 14 mg/dL (ref 8–23)
CO2: 30 mmol/L (ref 22–32)
Calcium: 8.9 mg/dL (ref 8.9–10.3)
Chloride: 103 mmol/L (ref 98–111)
Creatinine, Ser: 0.44 mg/dL (ref 0.44–1.00)
GFR, Estimated: >60 mL/min (ref >60)
Glucose, Bld: 172 mg/dL — ABNORMAL HIGH (ref 70–99)
Potassium: 4.6 mmol/L (ref 3.5–5.1)
Sodium: 137 mmol/L (ref 135–145)
Total Bilirubin: 0.5 mg/dL (ref 0.3–1.2)
Total Protein: 6.7 g/dL (ref 6.5–8.1)

## 2022-06-11 MED ORDER — IOHEXOL 300 MG/ML  SOLN
100.0000 mL | Freq: Once | INTRAMUSCULAR | Status: AC | PRN
  Administered 2022-06-11: 100 mL via INTRAVENOUS

## 2022-06-11 MED ORDER — SODIUM CHLORIDE 0.9 % IV BOLUS
250.0000 mL | Freq: Once | INTRAVENOUS | Status: AC
  Administered 2022-06-11: 250 mL via INTRAVENOUS

## 2022-06-11 NOTE — ED Notes (Signed)
Attempted to contact facility, no answer.

## 2022-06-11 NOTE — ED Provider Notes (Signed)
[facility] DEPT Provider Note  CSN: [id number] Arrival date & time: 06/11/22 1631  Chief Complaint(s) Vaginal Bleeding  HPI Toni [name] is a 88 y.o. female with PMH T2DM, HTN, CHF who presents emergency department for evaluation of pelvic bleeding.  Patient had a similar presentation earlier this month where she was found to have UTI.  Patient states that she awoke this morning and her diaper was full of blood and blood clots.  She states that she has not had any difficulty stooling recently and has had no pain stooling.  Has not seen gross blood in her stool.  Patient has a history of a partial hysterectomy and CT imaging earlier this month did not visualize any remaining uterus.  Currently not on blood thinners.  Denies chest pain, shortness of breath, abdominal pain, nausea, vomiting or other systemic symptoms.   Past Medical History Past Medical History:  Diagnosis Date   Diabetes mellitus without complication ([place])    Hypertension    Peripheral vascular disease ([place])    Plantar fascial fibromatosis    Patient Active Problem List   Diagnosis Date Noted   Acute on chronic diastolic CHF (congestive heart failure) ([place]) 03/29/2022   Type 2 diabetes mellitus with hyperglycemia, with long-term current use of insulin ([place]) 03/29/2022   Pyuria 03/29/2022   Bacteriuria 03/29/2022   Essential hypertension 03/29/2022   Prolonged QT interval 03/29/2022   PVD (peripheral vascular disease) ([place]) 03/29/2022   Obesity, Class III, BMI 40-49.9 (morbid obesity) ([place]) 03/29/2022   Pulmonary hypertension ([place]) 03/29/2022   Hyponatremia 03/29/2022   Normocytic anemia 03/29/2022   Stress fracture, left femur, initial encounter for fracture 03/24/2022   Femur fracture ([place]) 03/24/2022   Sepsis ([place])    Goals of care, counseling/discussion    Palliative care by specialist    Streptococcal sepsis ([place]) 09/02/2016   Streptococcal bacteremia 09/02/2016   E. coli  UTI 98/92/1194   Acute metabolic encephalopathy 17/40/8144   Leukocytosis 09/02/2016   Community acquired pneumonia 08/31/2016   Home Medication(s) Prior to Admission medications   Medication Sig Start Date End Date Taking? Authorizing Provider  acetaminophen (TYLENOL) 500 MG tablet Take 500 mg by mouth daily.    [provider]  albuterol (PROVENTIL) (2.5 MG/3ML) 0.083% nebulizer solution Take 3 mLs (2.5 mg total) by nebulization every 4 (four) hours as needed for wheezing or shortness of breath. 09/09/16   Robbie Lis, MD  aspirin 81 MG chewable tablet Chew 81 mg by mouth every evening.    [provider]  bisacodyl (DULCOLAX) 5 MG EC tablet Take 1 tablet (5 mg total) by mouth daily as needed for moderate constipation. 03/29/22   Raiford Noble Latif, DO  carvedilol (COREG) 3.125 MG tablet Take 1 tablet (3.125 mg total) by mouth 2 (two) times daily with a meal. 03/29/22   Sheikh, Georgina Quint Latif, DO  cholecalciferol (CHOLECALCIFEROL) 25 MCG tablet Take 2 tablets (2,000 Units total) by mouth 2 (two) times daily. 03/29/22   Raiford Noble Latif, DO  feeding supplement (ENSURE ENLIVE / ENSURE PLUS) LIQD Take 237 mLs by mouth 2 (two) times daily between meals. Patient not taking: Reported on 05/24/2022 03/29/22   Raiford Noble Latif, DO  furosemide (LASIX) 40 MG tablet Take 40 mg by mouth daily.    [provider]  HYDROcodone-acetaminophen (NORCO) 5-325 MG tablet Take 1-2 tablets by mouth every 6 (six) hours as needed for severe pain. Patient not taking: Reported on 05/24/2022 03/28/22   Noemi Chapel  N, PA-C  Menthol, Topical Analgesic, (BIOFREEZE) 4 % GEL Apply 1 Application topically 2 (two) times daily.    [provider]  Multiple Vitamins-Minerals (CENTRUM SILVER PO) Take 1 tablet by mouth daily.     [provider]  Nutritional Supplements (NUTRITIONAL SUPPLEMENT PO) Take 237 mLs by mouth 2 (two) times daily.    [provider]  ondansetron  (ZOFRAN) 4 MG tablet Take 1 tablet (4 mg total) by mouth every 6 (six) hours as needed for nausea. 03/29/22   Sheikh, Georgina Quint Latif, DO  Polyethyl Glycol-Propyl Glycol (SYSTANE ULTRA) 0.4-0.3 % SOLN Place 2 drops into both eyes 2 (two) times daily as needed (for dry eyes).    [provider]  Potassium Chloride ER 20 MEQ TBCR Take 20 mEq by mouth daily.    [provider]  senna-docusate (SENOKOT-S) 8.6-50 MG tablet Take 1 tablet by mouth at bedtime as needed for mild constipation or moderate constipation. 05/24/22   Long, Wonda Olds, MD  simvastatin (ZOCOR) 40 MG tablet Take 40 mg by mouth daily.     [provider]  Vitamin D, Ergocalciferol, (DRISDOL) 1.25 MG (50000 UNIT) CAPS capsule Take 1 capsule (50,000 Units total) by mouth every 7 (seven) days. Patient not taking: Reported on 05/24/2022 04/02/22   Raiford Noble Latif, DO  XARELTO 10 MG TABS tablet Take 10 mg by mouth daily. 04/27/22   [provider]                                                                                                                                    Past Surgical History Past Surgical History:  Procedure Laterality Date   ABDOMINAL HYSTERECTOMY     partial   ECTOPIC PREGNANCY SURGERY     JOINT REPLACEMENT Bilateral    TKR   ORIF FEMUR FRACTURE Left 03/25/2022   Procedure: OPEN REDUCTION INTERNAL FIXATION LEFT FEMUR;  Surgeon: Altamese [place]owbrook Farm, MD;  Location: [facility];  Service: Orthopedics;  Laterality: Left;   TEE WITHOUT CARDIOVERSION N/A 09/07/2016   Procedure: TRANSESOPHAGEAL ECHOCARDIOGRAM (TEE);  Surgeon: Skeet Latch, MD;  Location: [facility] ENDOSCOPY;  Service: Cardiovascular;  Laterality: N/A;   Family History Family History  Problem Relation Age of Onset   Hypertension Mother    Hyperlipidemia Mother        before age 38   Heart disease Father    Hypertension Father    Hyperlipidemia Father        before age 48   Diabetes Sister    Other Sister        amputation     Social History Social History   Tobacco Use   Smoking status: Never   Smokeless tobacco: Never  Substance Use Topics   Alcohol use: No   Drug use: No   Allergies Patient has no known allergies.  Review of Systems Review of Systems  Genitourinary:  Positive for hematuria.  Physical Exam Vital Signs  I have reviewed the triage vital signs BP (!) 152/88 (BP Location: Right Arm)   Pulse 86   Temp 97.6 F (36.4 C) (Oral)   Resp 18   Ht '5\' 2"'$  (1.575 m)   Wt 115 kg   SpO2 100%   BMI 46.37 kg/m   Physical Exam Vitals and nursing note reviewed.  Constitutional:      General: She is not in acute distress.    Appearance: She is well-developed.  HENT:     Head: Normocephalic and atraumatic.  Eyes:     Conjunctiva/sclera: Conjunctivae normal.  Cardiovascular:     Rate and Rhythm: Normal rate and regular rhythm.     Heart sounds: No murmur heard. Pulmonary:     Effort: Pulmonary effort is normal. No respiratory distress.     Breath sounds: Normal breath sounds.  Abdominal:     Palpations: Abdomen is soft.     Tenderness: There is no abdominal tenderness.  Genitourinary:    Comments: Hyperpigmentation along the inguinal folds with foul smell, no obvious laceration noted Musculoskeletal:        General: No swelling.     Cervical back: Neck supple.  Skin:    General: Skin is warm and dry.     Capillary Refill: Capillary refill takes less than 2 seconds.  Neurological:     Mental Status: She is alert.  Psychiatric:        Mood and Affect: Mood normal.     ED Results and Treatments Labs (all labs ordered are listed, but only abnormal results are displayed) Labs Reviewed  COMPREHENSIVE METABOLIC PANEL - Abnormal; Notable for the following components:      Result Value   Glucose, Bld 172 (*)    Albumin 2.9 (*)    Anion gap 4 (*)    All other components within normal limits  URINALYSIS, ROUTINE W REFLEX MICROSCOPIC - Abnormal; Notable for the following  components:   Color, Urine RED (*)    APPearance TURBID (*)    Glucose, UA   (*)    Value: TEST NOT REPORTED DUE TO COLOR INTERFERENCE OF URINE PIGMENT   Hgb urine dipstick   (*)    Value: TEST NOT REPORTED DUE TO COLOR INTERFERENCE OF URINE PIGMENT   Bilirubin Urine   (*)    Value: TEST NOT REPORTED DUE TO COLOR INTERFERENCE OF URINE PIGMENT   Ketones, ur   (*)    Value: TEST NOT REPORTED DUE TO COLOR INTERFERENCE OF URINE PIGMENT   Protein, ur   (*)    Value: TEST NOT REPORTED DUE TO COLOR INTERFERENCE OF URINE PIGMENT   Nitrite   (*)    Value: TEST NOT REPORTED DUE TO COLOR INTERFERENCE OF URINE PIGMENT   Leukocytes,Ua   (*)    Value: TEST NOT REPORTED DUE TO COLOR INTERFERENCE OF URINE PIGMENT   RBC / HPF >50 (*)    WBC, UA >50 (*)    Bacteria, UA MANY (*)    All other components within normal limits  CBC WITH DIFFERENTIAL/PLATELET  Radiology CT HEMATURIA WORKUP  Result Date: 06/11/2022 CLINICAL DATA:  Gross hematuria this morning EXAM: CT ABDOMEN AND PELVIS WITHOUT AND WITH CONTRAST TECHNIQUE: Multidetector CT imaging of the abdomen and pelvis was performed following the standard protocol before and following the bolus administration of intravenous contrast. RADIATION DOSE REDUCTION: This exam was performed according to the departmental dose-optimization program which includes automated exposure control, adjustment of the mA and/or kV according to patient size and/or use of iterative reconstruction technique. CONTRAST:  156m OMNIPAQUE IOHEXOL 300 MG/ML  SOLN COMPARISON:  05/23/2022 FINDINGS: Lower chest: No acute abnormality. Coronary artery calcifications. Scarring and or atelectasis of the bilateral lung bases. Small hiatal hernia. Hepatobiliary: Nodular contour of the liver. No solid liver abnormality is seen. Multiple simple, benign liver cysts, for which no  further follow-up or characterization is required. Minimal sludge in the gallbladder fundus (series 7, image 29). No gallbladder wall thickening, or biliary dilatation. Pancreas: Unremarkable. No pancreatic ductal dilatation or surrounding inflammatory changes. Spleen: Normal in size without significant abnormality. Adrenals/Urinary Tract: Adrenal glands are unremarkable. Multiple simple and thinly septated benign bilateral renal cortical cysts, for which no further follow-up or characterization is required. No solid renal lesion or suspicious contrast enhancement. Multiple, nodular clots in the dependent urinary bladder as well as within a diverticulum of the posterior left aspect of the urinary bladder, which is of intermediate attenuation and nonenhancing, precontrast HU = 47, postcontrast HU = 47 (series 3, image 68, series 7, image 68). Limited opacification of the mid left ureter. Within this limitation, no other evidence of urinary tract filling defect on delayed phase imaging. Stomach/Bowel: Stomach is within normal limits. Appendix is not clearly visualized. No evidence of bowel wall thickening, distention, or inflammatory changes. Pancolonic diverticulosis. Vascular/Lymphatic: Aortic atherosclerosis. Cavernous transformation of the portal vein (series 7, image 25). No enlarged abdominal or pelvic lymph nodes. Reproductive: Status post hysterectomy. Contrast within the vaginal cuff on delayed phase imaging (series 12, image 70). Other: Small bilateral bowel containing inguinal hernias, containing sigmoid colon on the left and distal ileum on the right (series 3, image 79). Small, fat containing umbilical hernia. No ascites. Musculoskeletal: No acute or significant osseous findings. IMPRESSION: 1. Multiple, nodular clots in the dependent urinary bladder as well as within a diverticulum of the posterior left aspect of the urinary bladder, which is new compared to recent prior examination and without contrast  enhancement to suggest bladder mass. 2. Limited opacification of the mid left ureter. Within this limitation, no other evidence of urinary tract filling defect on delayed phase imaging. 3. Status post hysterectomy. Contrast within the vaginal cuff on delayed phase imaging, suggesting vesicovaginal fistula. 4. Cirrhotic morphology of the liver with chronic, cavernous transformation of the portal vein. 5. Small bilateral bowel containing inguinal hernias, containing sigmoid colon on the left and distal ileum on the right. No evidence of bowel obstruction. 6. Coronary artery disease. Aortic Atherosclerosis (ICD10-I70.0). Electronically Signed   By: ADelanna AhmadiM.D.   On: 06/11/2022 19:38    Pertinent labs & imaging results that were available during my care of the patient were reviewed by me and considered in my medical decision making (see MDM for details).  Medications Ordered in ED Medications  iohexol (OMNIPAQUE) 300 MG/ML solution 100 mL (100 mLs Intravenous Contrast Given 06/11/22 1842)  sodium chloride 0.9 % bolus 250 mL (0 mLs Intravenous Stopped 06/11/22 2103)  Procedures Procedures  (including critical care time)  Medical Decision Making / ED Course   This patient presents to the ED for concern of hematuria, this involves an extensive number of treatment options, and is a complaint that carries with it a high risk of complications and morbidity.  The differential diagnosis includes bladder cancer, UTI, renal cell carcinoma, lichen planus, lower GI bleeding  MDM: Department for evaluation of suspected hematuria.  Physical exam reveals hypopigmented lesion across the intertrigo surface of the perineum but no active gross bleeding seen from any intertrigal lesions or from the rectum.  Laboratory evaluation unremarkable including a normal hemoglobin of 12.4.  In  And out cath performed and urinalysis consistent with gross hematuria.  CT hematuria workup showing multiple nodular clots in the urinary bladder as well as within the diverticulum of the left aspect of the urinary bladder which appears to be new.  I spoke with the urologist on-call Dr. Tresa Moore who states that bladder irrigation does not need to be performed here in the emergency department and patient is safe for discharge with outpatient urologic follow-up as she likely will need a cystoscopy.  Patient then discharged with outpatient urologic follow-up.   Additional history obtained: -Additional history obtained from daughter -External records from outside source obtained and reviewed including: Chart review including previous notes, labs, imaging, consultation notes   Lab Tests: -I ordered, reviewed, and interpreted labs.   The pertinent results include:   Labs Reviewed  COMPREHENSIVE METABOLIC PANEL - Abnormal; Notable for the following components:      Result Value   Glucose, Bld 172 (*)    Albumin 2.9 (*)    Anion gap 4 (*)    All other components within normal limits  URINALYSIS, ROUTINE W REFLEX MICROSCOPIC - Abnormal; Notable for the following components:   Color, Urine RED (*)    APPearance TURBID (*)    Glucose, UA   (*)    Value: TEST NOT REPORTED DUE TO COLOR INTERFERENCE OF URINE PIGMENT   Hgb urine dipstick   (*)    Value: TEST NOT REPORTED DUE TO COLOR INTERFERENCE OF URINE PIGMENT   Bilirubin Urine   (*)    Value: TEST NOT REPORTED DUE TO COLOR INTERFERENCE OF URINE PIGMENT   Ketones, ur   (*)    Value: TEST NOT REPORTED DUE TO COLOR INTERFERENCE OF URINE PIGMENT   Protein, ur   (*)    Value: TEST NOT REPORTED DUE TO COLOR INTERFERENCE OF URINE PIGMENT   Nitrite   (*)    Value: TEST NOT REPORTED DUE TO COLOR INTERFERENCE OF URINE PIGMENT   Leukocytes,Ua   (*)    Value: TEST NOT REPORTED DUE TO COLOR INTERFERENCE OF URINE PIGMENT   RBC / HPF >50 (*)    WBC, UA >50  (*)    Bacteria, UA MANY (*)    All other components within normal limits  CBC WITH DIFFERENTIAL/PLATELET     Imaging Studies ordered: I ordered imaging studies including CT AP I independently visualized and interpreted imaging. I agree with the radiologist interpretation   Medicines ordered and prescription drug management: Meds ordered this encounter  Medications   iohexol (OMNIPAQUE) 300 MG/ML solution 100 mL   sodium chloride 0.9 % bolus 250 mL    -I have reviewed the patients home medicines and have made adjustments as needed  Critical interventions none  Consultations Obtained: I requested consultation with the urologist on-call,  and discussed lab and imaging findings  as well as pertinent plan - they recommend: Outpatient follow-up   Cardiac Monitoring: The patient was maintained on a cardiac monitor.  I personally viewed and interpreted the cardiac monitored which showed an underlying rhythm of: NSR  Social Determinants of Health:  Factors impacting patients care include: none   Reevaluation: After the interventions noted above, I reevaluated the patient and found that they have :improved  Co morbidities that complicate the patient evaluation  Past Medical History:  Diagnosis Date   Diabetes mellitus without complication ([place])    Hypertension    Peripheral vascular disease ([place])    Plantar fascial fibromatosis       Dispostion: I considered admission for this patient, but she does not meet inpatient criteria for admission she is safe for discharge with follow-up     Final Clinical Impression(s) / ED Diagnoses Final diagnoses:  Gross hematuria     '@PCDICTATION'$ @    Teressa Lower, MD 06/11/22 2317

## 2022-06-11 NOTE — ED Triage Notes (Signed)
Pt BIB EMS from [facility] pt and staff noted bright red blood and cloths in brief this morning. Pt covid +, not currently on blood thinners. Pt denies any symptoms

## 2022-06-14 DIAGNOSIS — R310 Gross hematuria: Secondary | ICD-10-CM | POA: Diagnosis not present

## 2022-06-15 ENCOUNTER — Other Ambulatory Visit: Payer: Self-pay | Admitting: *Deleted

## 2022-06-15 DIAGNOSIS — L988 Other specified disorders of the skin and subcutaneous tissue: Secondary | ICD-10-CM | POA: Diagnosis not present

## 2022-06-15 DIAGNOSIS — M6281 Muscle weakness (generalized): Secondary | ICD-10-CM | POA: Diagnosis not present

## 2022-06-15 DIAGNOSIS — I10 Essential (primary) hypertension: Secondary | ICD-10-CM | POA: Diagnosis not present

## 2022-06-15 DIAGNOSIS — I5032 Chronic diastolic (congestive) heart failure: Secondary | ICD-10-CM | POA: Diagnosis not present

## 2022-06-15 DIAGNOSIS — K5909 Other constipation: Secondary | ICD-10-CM | POA: Diagnosis not present

## 2022-06-15 DIAGNOSIS — S72392S Other fracture of shaft of left femur, sequela: Secondary | ICD-10-CM | POA: Diagnosis not present

## 2022-06-15 NOTE — Patient Outreach (Signed)
Per [facility] Mrs. Oncale resides in [facility]. Screening for potential [facility] care coordination services as benefit of insurance plan and PCP.   Secure communication sent to SNF social worker to request update on transition plans.   Will continue to follow.   Marthenia Rolling, MSN, RN,BSN [facility] Acute Care Coordinator [phone] (Direct dial)

## 2022-06-16 DIAGNOSIS — S72302D Unspecified fracture of shaft of left femur, subsequent encounter for closed fracture with routine healing: Secondary | ICD-10-CM | POA: Diagnosis not present

## 2022-06-16 DIAGNOSIS — M9712XD Periprosthetic fracture around internal prosthetic left knee joint, subsequent encounter: Secondary | ICD-10-CM | POA: Diagnosis not present

## 2022-06-18 DIAGNOSIS — Z9181 History of falling: Secondary | ICD-10-CM | POA: Diagnosis not present

## 2022-06-18 DIAGNOSIS — M9712XD Periprosthetic fracture around internal prosthetic left knee joint, subsequent encounter: Secondary | ICD-10-CM | POA: Diagnosis not present

## 2022-06-18 DIAGNOSIS — D649 Anemia, unspecified: Secondary | ICD-10-CM | POA: Diagnosis not present

## 2022-06-18 DIAGNOSIS — E118 Type 2 diabetes mellitus with unspecified complications: Secondary | ICD-10-CM | POA: Diagnosis not present

## 2022-06-18 DIAGNOSIS — I5032 Chronic diastolic (congestive) heart failure: Secondary | ICD-10-CM | POA: Diagnosis not present

## 2022-06-18 DIAGNOSIS — N3946 Mixed incontinence: Secondary | ICD-10-CM | POA: Diagnosis not present

## 2022-06-18 DIAGNOSIS — I272 Pulmonary hypertension, unspecified: Secondary | ICD-10-CM | POA: Diagnosis not present

## 2022-06-18 DIAGNOSIS — E785 Hyperlipidemia, unspecified: Secondary | ICD-10-CM | POA: Diagnosis not present

## 2022-06-18 DIAGNOSIS — R2689 Other abnormalities of gait and mobility: Secondary | ICD-10-CM | POA: Diagnosis not present

## 2022-06-22 DIAGNOSIS — E118 Type 2 diabetes mellitus with unspecified complications: Secondary | ICD-10-CM | POA: Diagnosis not present

## 2022-06-22 DIAGNOSIS — D649 Anemia, unspecified: Secondary | ICD-10-CM | POA: Diagnosis not present

## 2022-06-22 DIAGNOSIS — R2689 Other abnormalities of gait and mobility: Secondary | ICD-10-CM | POA: Diagnosis not present

## 2022-06-22 DIAGNOSIS — Z9181 History of falling: Secondary | ICD-10-CM | POA: Diagnosis not present

## 2022-06-22 DIAGNOSIS — I272 Pulmonary hypertension, unspecified: Secondary | ICD-10-CM | POA: Diagnosis not present

## 2022-06-22 DIAGNOSIS — E785 Hyperlipidemia, unspecified: Secondary | ICD-10-CM | POA: Diagnosis not present

## 2022-06-22 DIAGNOSIS — M9712XD Periprosthetic fracture around internal prosthetic left knee joint, subsequent encounter: Secondary | ICD-10-CM | POA: Diagnosis not present

## 2022-06-22 DIAGNOSIS — I5032 Chronic diastolic (congestive) heart failure: Secondary | ICD-10-CM | POA: Diagnosis not present

## 2022-06-25 DIAGNOSIS — D649 Anemia, unspecified: Secondary | ICD-10-CM | POA: Diagnosis not present

## 2022-06-25 DIAGNOSIS — I5032 Chronic diastolic (congestive) heart failure: Secondary | ICD-10-CM | POA: Diagnosis not present

## 2022-06-25 DIAGNOSIS — E785 Hyperlipidemia, unspecified: Secondary | ICD-10-CM | POA: Diagnosis not present

## 2022-06-25 DIAGNOSIS — E118 Type 2 diabetes mellitus with unspecified complications: Secondary | ICD-10-CM | POA: Diagnosis not present

## 2022-06-25 DIAGNOSIS — I272 Pulmonary hypertension, unspecified: Secondary | ICD-10-CM | POA: Diagnosis not present

## 2022-06-25 DIAGNOSIS — Z9181 History of falling: Secondary | ICD-10-CM | POA: Diagnosis not present

## 2022-06-25 DIAGNOSIS — M9712XD Periprosthetic fracture around internal prosthetic left knee joint, subsequent encounter: Secondary | ICD-10-CM | POA: Diagnosis not present

## 2022-06-25 DIAGNOSIS — R2689 Other abnormalities of gait and mobility: Secondary | ICD-10-CM | POA: Diagnosis not present

## 2022-06-28 DIAGNOSIS — K5909 Other constipation: Secondary | ICD-10-CM | POA: Diagnosis not present

## 2022-06-29 DIAGNOSIS — I272 Pulmonary hypertension, unspecified: Secondary | ICD-10-CM | POA: Diagnosis not present

## 2022-06-29 DIAGNOSIS — Z9181 History of falling: Secondary | ICD-10-CM | POA: Diagnosis not present

## 2022-06-29 DIAGNOSIS — I5032 Chronic diastolic (congestive) heart failure: Secondary | ICD-10-CM | POA: Diagnosis not present

## 2022-06-29 DIAGNOSIS — R2689 Other abnormalities of gait and mobility: Secondary | ICD-10-CM | POA: Diagnosis not present

## 2022-06-29 DIAGNOSIS — D649 Anemia, unspecified: Secondary | ICD-10-CM | POA: Diagnosis not present

## 2022-06-29 DIAGNOSIS — E118 Type 2 diabetes mellitus with unspecified complications: Secondary | ICD-10-CM | POA: Diagnosis not present

## 2022-06-29 DIAGNOSIS — E785 Hyperlipidemia, unspecified: Secondary | ICD-10-CM | POA: Diagnosis not present

## 2022-06-29 DIAGNOSIS — M9712XD Periprosthetic fracture around internal prosthetic left knee joint, subsequent encounter: Secondary | ICD-10-CM | POA: Diagnosis not present

## 2022-06-30 DIAGNOSIS — I270 Primary pulmonary hypertension: Secondary | ICD-10-CM | POA: Diagnosis not present

## 2022-06-30 DIAGNOSIS — D6489 Other specified anemias: Secondary | ICD-10-CM | POA: Diagnosis not present

## 2022-07-01 DIAGNOSIS — I270 Primary pulmonary hypertension: Secondary | ICD-10-CM | POA: Diagnosis not present

## 2022-07-01 DIAGNOSIS — M6281 Muscle weakness (generalized): Secondary | ICD-10-CM | POA: Diagnosis not present

## 2022-07-01 DIAGNOSIS — D638 Anemia in other chronic diseases classified elsewhere: Secondary | ICD-10-CM | POA: Diagnosis not present

## 2022-07-01 DIAGNOSIS — R296 Repeated falls: Secondary | ICD-10-CM | POA: Diagnosis not present

## 2022-07-01 DIAGNOSIS — S72392S Other fracture of shaft of left femur, sequela: Secondary | ICD-10-CM | POA: Diagnosis not present

## 2022-07-01 DIAGNOSIS — I739 Peripheral vascular disease, unspecified: Secondary | ICD-10-CM | POA: Diagnosis not present

## 2022-07-01 DIAGNOSIS — E871 Hypo-osmolality and hyponatremia: Secondary | ICD-10-CM | POA: Diagnosis not present

## 2022-07-01 DIAGNOSIS — E1165 Type 2 diabetes mellitus with hyperglycemia: Secondary | ICD-10-CM | POA: Diagnosis not present

## 2022-07-01 DIAGNOSIS — I5032 Chronic diastolic (congestive) heart failure: Secondary | ICD-10-CM | POA: Diagnosis not present

## 2022-07-01 DIAGNOSIS — I10 Essential (primary) hypertension: Secondary | ICD-10-CM | POA: Diagnosis not present

## 2022-07-01 DIAGNOSIS — K5909 Other constipation: Secondary | ICD-10-CM | POA: Diagnosis not present

## 2022-07-01 DIAGNOSIS — I5033 Acute on chronic diastolic (congestive) heart failure: Secondary | ICD-10-CM | POA: Diagnosis not present

## 2022-07-02 DIAGNOSIS — I272 Pulmonary hypertension, unspecified: Secondary | ICD-10-CM | POA: Diagnosis not present

## 2022-07-02 DIAGNOSIS — D649 Anemia, unspecified: Secondary | ICD-10-CM | POA: Diagnosis not present

## 2022-07-02 DIAGNOSIS — E785 Hyperlipidemia, unspecified: Secondary | ICD-10-CM | POA: Diagnosis not present

## 2022-07-02 DIAGNOSIS — M9712XD Periprosthetic fracture around internal prosthetic left knee joint, subsequent encounter: Secondary | ICD-10-CM | POA: Diagnosis not present

## 2022-07-02 DIAGNOSIS — E118 Type 2 diabetes mellitus with unspecified complications: Secondary | ICD-10-CM | POA: Diagnosis not present

## 2022-07-02 DIAGNOSIS — Z9181 History of falling: Secondary | ICD-10-CM | POA: Diagnosis not present

## 2022-07-02 DIAGNOSIS — I5032 Chronic diastolic (congestive) heart failure: Secondary | ICD-10-CM | POA: Diagnosis not present

## 2022-07-02 DIAGNOSIS — R2689 Other abnormalities of gait and mobility: Secondary | ICD-10-CM | POA: Diagnosis not present

## 2022-07-06 DIAGNOSIS — B351 Tinea unguium: Secondary | ICD-10-CM | POA: Diagnosis not present

## 2022-07-06 DIAGNOSIS — R2689 Other abnormalities of gait and mobility: Secondary | ICD-10-CM | POA: Diagnosis not present

## 2022-07-06 DIAGNOSIS — E785 Hyperlipidemia, unspecified: Secondary | ICD-10-CM | POA: Diagnosis not present

## 2022-07-06 DIAGNOSIS — Z9181 History of falling: Secondary | ICD-10-CM | POA: Diagnosis not present

## 2022-07-06 DIAGNOSIS — I5032 Chronic diastolic (congestive) heart failure: Secondary | ICD-10-CM | POA: Diagnosis not present

## 2022-07-06 DIAGNOSIS — E118 Type 2 diabetes mellitus with unspecified complications: Secondary | ICD-10-CM | POA: Diagnosis not present

## 2022-07-06 DIAGNOSIS — M2012 Hallux valgus (acquired), left foot: Secondary | ICD-10-CM | POA: Diagnosis not present

## 2022-07-06 DIAGNOSIS — M2011 Hallux valgus (acquired), right foot: Secondary | ICD-10-CM | POA: Diagnosis not present

## 2022-07-06 DIAGNOSIS — I272 Pulmonary hypertension, unspecified: Secondary | ICD-10-CM | POA: Diagnosis not present

## 2022-07-06 DIAGNOSIS — I739 Peripheral vascular disease, unspecified: Secondary | ICD-10-CM | POA: Diagnosis not present

## 2022-07-06 DIAGNOSIS — M9712XD Periprosthetic fracture around internal prosthetic left knee joint, subsequent encounter: Secondary | ICD-10-CM | POA: Diagnosis not present

## 2022-07-06 DIAGNOSIS — D649 Anemia, unspecified: Secondary | ICD-10-CM | POA: Diagnosis not present

## 2022-07-06 DIAGNOSIS — L84 Corns and callosities: Secondary | ICD-10-CM | POA: Diagnosis not present

## 2022-07-20 DIAGNOSIS — S72392S Other fracture of shaft of left femur, sequela: Secondary | ICD-10-CM | POA: Diagnosis not present

## 2022-07-20 DIAGNOSIS — I5032 Chronic diastolic (congestive) heart failure: Secondary | ICD-10-CM | POA: Diagnosis not present

## 2022-07-20 DIAGNOSIS — M6281 Muscle weakness (generalized): Secondary | ICD-10-CM | POA: Diagnosis not present

## 2022-07-20 DIAGNOSIS — I10 Essential (primary) hypertension: Secondary | ICD-10-CM | POA: Diagnosis not present

## 2022-07-26 ENCOUNTER — Other Ambulatory Visit: Payer: Self-pay | Admitting: *Deleted

## 2022-07-26 NOTE — Patient Outreach (Signed)
[facility] Coordinator follow up. Mrs. Glendening resides in [facility] skilled nursing facility. Screening for potential [facility] care coordination services as benefit of insurance plan and Primary Care Provider.  Confirmed with [place], skilled nursing facility social worker, Mrs. Benavidez has transitioned to long term care.   No identifiable [facility] care coordination needs.   Marthenia Rolling, MSN, RN,BSN [place] Acute Care Coordinator [phone] (Direct dial)

## 2022-08-05 DIAGNOSIS — E1165 Type 2 diabetes mellitus with hyperglycemia: Secondary | ICD-10-CM | POA: Diagnosis not present

## 2022-08-05 DIAGNOSIS — I5033 Acute on chronic diastolic (congestive) heart failure: Secondary | ICD-10-CM | POA: Diagnosis not present

## 2022-08-05 DIAGNOSIS — I10 Essential (primary) hypertension: Secondary | ICD-10-CM | POA: Diagnosis not present

## 2022-08-05 DIAGNOSIS — M6281 Muscle weakness (generalized): Secondary | ICD-10-CM | POA: Diagnosis not present

## 2022-08-05 DIAGNOSIS — R296 Repeated falls: Secondary | ICD-10-CM | POA: Diagnosis not present

## 2022-08-05 DIAGNOSIS — I739 Peripheral vascular disease, unspecified: Secondary | ICD-10-CM | POA: Diagnosis not present

## 2022-08-05 DIAGNOSIS — K5909 Other constipation: Secondary | ICD-10-CM | POA: Diagnosis not present

## 2022-08-05 DIAGNOSIS — I5032 Chronic diastolic (congestive) heart failure: Secondary | ICD-10-CM | POA: Diagnosis not present

## 2022-08-05 DIAGNOSIS — S72392S Other fracture of shaft of left femur, sequela: Secondary | ICD-10-CM | POA: Diagnosis not present

## 2022-08-05 DIAGNOSIS — I270 Primary pulmonary hypertension: Secondary | ICD-10-CM | POA: Diagnosis not present

## 2022-08-05 DIAGNOSIS — D638 Anemia in other chronic diseases classified elsewhere: Secondary | ICD-10-CM | POA: Diagnosis not present

## 2022-08-05 DIAGNOSIS — E871 Hypo-osmolality and hyponatremia: Secondary | ICD-10-CM | POA: Diagnosis not present

## 2022-08-18 DIAGNOSIS — D6489 Other specified anemias: Secondary | ICD-10-CM | POA: Diagnosis not present

## 2022-08-18 DIAGNOSIS — I270 Primary pulmonary hypertension: Secondary | ICD-10-CM | POA: Diagnosis not present

## 2022-09-06 DIAGNOSIS — I739 Peripheral vascular disease, unspecified: Secondary | ICD-10-CM | POA: Diagnosis not present

## 2022-09-06 DIAGNOSIS — M2041 Other hammer toe(s) (acquired), right foot: Secondary | ICD-10-CM | POA: Diagnosis not present

## 2022-09-06 DIAGNOSIS — L84 Corns and callosities: Secondary | ICD-10-CM | POA: Diagnosis not present

## 2022-09-06 DIAGNOSIS — M2042 Other hammer toe(s) (acquired), left foot: Secondary | ICD-10-CM | POA: Diagnosis not present

## 2022-09-06 DIAGNOSIS — B351 Tinea unguium: Secondary | ICD-10-CM | POA: Diagnosis not present

## 2022-09-09 DIAGNOSIS — M6281 Muscle weakness (generalized): Secondary | ICD-10-CM | POA: Diagnosis not present

## 2022-09-09 DIAGNOSIS — R296 Repeated falls: Secondary | ICD-10-CM | POA: Diagnosis not present

## 2022-09-09 DIAGNOSIS — K5909 Other constipation: Secondary | ICD-10-CM | POA: Diagnosis not present

## 2022-09-09 DIAGNOSIS — I270 Primary pulmonary hypertension: Secondary | ICD-10-CM | POA: Diagnosis not present

## 2022-09-09 DIAGNOSIS — I5032 Chronic diastolic (congestive) heart failure: Secondary | ICD-10-CM | POA: Diagnosis not present

## 2022-09-09 DIAGNOSIS — I5033 Acute on chronic diastolic (congestive) heart failure: Secondary | ICD-10-CM | POA: Diagnosis not present

## 2022-09-09 DIAGNOSIS — S72392S Other fracture of shaft of left femur, sequela: Secondary | ICD-10-CM | POA: Diagnosis not present

## 2022-09-09 DIAGNOSIS — I10 Essential (primary) hypertension: Secondary | ICD-10-CM | POA: Diagnosis not present

## 2022-09-09 DIAGNOSIS — D638 Anemia in other chronic diseases classified elsewhere: Secondary | ICD-10-CM | POA: Diagnosis not present

## 2022-09-09 DIAGNOSIS — E871 Hypo-osmolality and hyponatremia: Secondary | ICD-10-CM | POA: Diagnosis not present

## 2022-09-09 DIAGNOSIS — E1165 Type 2 diabetes mellitus with hyperglycemia: Secondary | ICD-10-CM | POA: Diagnosis not present

## 2022-09-09 DIAGNOSIS — I739 Peripheral vascular disease, unspecified: Secondary | ICD-10-CM | POA: Diagnosis not present

## 2022-09-10 DIAGNOSIS — E119 Type 2 diabetes mellitus without complications: Secondary | ICD-10-CM | POA: Diagnosis not present

## 2022-09-10 DIAGNOSIS — I10 Essential (primary) hypertension: Secondary | ICD-10-CM | POA: Diagnosis not present

## 2022-09-10 DIAGNOSIS — E559 Vitamin D deficiency, unspecified: Secondary | ICD-10-CM | POA: Diagnosis not present

## 2022-09-28 DIAGNOSIS — K5909 Other constipation: Secondary | ICD-10-CM | POA: Diagnosis not present

## 2022-09-28 DIAGNOSIS — E782 Mixed hyperlipidemia: Secondary | ICD-10-CM | POA: Diagnosis not present

## 2022-09-28 DIAGNOSIS — I5032 Chronic diastolic (congestive) heart failure: Secondary | ICD-10-CM | POA: Diagnosis not present

## 2022-09-28 DIAGNOSIS — M6281 Muscle weakness (generalized): Secondary | ICD-10-CM | POA: Diagnosis not present

## 2022-09-28 DIAGNOSIS — I10 Essential (primary) hypertension: Secondary | ICD-10-CM | POA: Diagnosis not present

## 2022-10-14 DIAGNOSIS — I10 Essential (primary) hypertension: Secondary | ICD-10-CM | POA: Diagnosis not present

## 2022-10-14 DIAGNOSIS — E782 Mixed hyperlipidemia: Secondary | ICD-10-CM | POA: Diagnosis not present

## 2022-10-15 DIAGNOSIS — E1165 Type 2 diabetes mellitus with hyperglycemia: Secondary | ICD-10-CM | POA: Diagnosis not present

## 2022-10-15 DIAGNOSIS — I270 Primary pulmonary hypertension: Secondary | ICD-10-CM | POA: Diagnosis not present

## 2022-10-15 DIAGNOSIS — R634 Abnormal weight loss: Secondary | ICD-10-CM | POA: Diagnosis not present

## 2022-10-15 DIAGNOSIS — E871 Hypo-osmolality and hyponatremia: Secondary | ICD-10-CM | POA: Diagnosis not present

## 2022-10-15 DIAGNOSIS — I5032 Chronic diastolic (congestive) heart failure: Secondary | ICD-10-CM | POA: Diagnosis not present

## 2022-10-15 DIAGNOSIS — I10 Essential (primary) hypertension: Secondary | ICD-10-CM | POA: Diagnosis not present

## 2022-10-15 DIAGNOSIS — I5033 Acute on chronic diastolic (congestive) heart failure: Secondary | ICD-10-CM | POA: Diagnosis not present

## 2022-10-15 DIAGNOSIS — I739 Peripheral vascular disease, unspecified: Secondary | ICD-10-CM | POA: Diagnosis not present

## 2022-10-15 DIAGNOSIS — D638 Anemia in other chronic diseases classified elsewhere: Secondary | ICD-10-CM | POA: Diagnosis not present

## 2022-10-15 DIAGNOSIS — R296 Repeated falls: Secondary | ICD-10-CM | POA: Diagnosis not present

## 2022-10-15 DIAGNOSIS — S72392S Other fracture of shaft of left femur, sequela: Secondary | ICD-10-CM | POA: Diagnosis not present

## 2022-10-15 DIAGNOSIS — M6281 Muscle weakness (generalized): Secondary | ICD-10-CM | POA: Diagnosis not present

## 2022-10-27 DIAGNOSIS — R278 Other lack of coordination: Secondary | ICD-10-CM | POA: Diagnosis not present

## 2022-10-27 DIAGNOSIS — M6281 Muscle weakness (generalized): Secondary | ICD-10-CM | POA: Diagnosis not present

## 2022-10-27 DIAGNOSIS — S7292XD Unspecified fracture of left femur, subsequent encounter for closed fracture with routine healing: Secondary | ICD-10-CM | POA: Diagnosis not present

## 2022-10-29 DIAGNOSIS — M6281 Muscle weakness (generalized): Secondary | ICD-10-CM | POA: Diagnosis not present

## 2022-10-29 DIAGNOSIS — R278 Other lack of coordination: Secondary | ICD-10-CM | POA: Diagnosis not present

## 2022-10-29 DIAGNOSIS — S7292XD Unspecified fracture of left femur, subsequent encounter for closed fracture with routine healing: Secondary | ICD-10-CM | POA: Diagnosis not present

## 2022-10-30 DIAGNOSIS — R278 Other lack of coordination: Secondary | ICD-10-CM | POA: Diagnosis not present

## 2022-10-30 DIAGNOSIS — M6281 Muscle weakness (generalized): Secondary | ICD-10-CM | POA: Diagnosis not present

## 2022-10-30 DIAGNOSIS — S7292XD Unspecified fracture of left femur, subsequent encounter for closed fracture with routine healing: Secondary | ICD-10-CM | POA: Diagnosis not present

## 2022-11-01 DIAGNOSIS — M6281 Muscle weakness (generalized): Secondary | ICD-10-CM | POA: Diagnosis not present

## 2022-11-01 DIAGNOSIS — S7292XD Unspecified fracture of left femur, subsequent encounter for closed fracture with routine healing: Secondary | ICD-10-CM | POA: Diagnosis not present

## 2022-11-01 DIAGNOSIS — R278 Other lack of coordination: Secondary | ICD-10-CM | POA: Diagnosis not present

## 2022-11-03 DIAGNOSIS — R278 Other lack of coordination: Secondary | ICD-10-CM | POA: Diagnosis not present

## 2022-11-03 DIAGNOSIS — M6281 Muscle weakness (generalized): Secondary | ICD-10-CM | POA: Diagnosis not present

## 2022-11-03 DIAGNOSIS — S7292XD Unspecified fracture of left femur, subsequent encounter for closed fracture with routine healing: Secondary | ICD-10-CM | POA: Diagnosis not present

## 2022-11-04 DIAGNOSIS — M6281 Muscle weakness (generalized): Secondary | ICD-10-CM | POA: Diagnosis not present

## 2022-11-04 DIAGNOSIS — S7292XD Unspecified fracture of left femur, subsequent encounter for closed fracture with routine healing: Secondary | ICD-10-CM | POA: Diagnosis not present

## 2022-11-04 DIAGNOSIS — R278 Other lack of coordination: Secondary | ICD-10-CM | POA: Diagnosis not present

## 2022-11-05 DIAGNOSIS — S7292XD Unspecified fracture of left femur, subsequent encounter for closed fracture with routine healing: Secondary | ICD-10-CM | POA: Diagnosis not present

## 2022-11-05 DIAGNOSIS — R278 Other lack of coordination: Secondary | ICD-10-CM | POA: Diagnosis not present

## 2022-11-05 DIAGNOSIS — M6281 Muscle weakness (generalized): Secondary | ICD-10-CM | POA: Diagnosis not present

## 2022-11-09 DIAGNOSIS — R278 Other lack of coordination: Secondary | ICD-10-CM | POA: Diagnosis not present

## 2022-11-09 DIAGNOSIS — S7292XD Unspecified fracture of left femur, subsequent encounter for closed fracture with routine healing: Secondary | ICD-10-CM | POA: Diagnosis not present

## 2022-11-09 DIAGNOSIS — M6281 Muscle weakness (generalized): Secondary | ICD-10-CM | POA: Diagnosis not present

## 2022-11-10 DIAGNOSIS — R278 Other lack of coordination: Secondary | ICD-10-CM | POA: Diagnosis not present

## 2022-11-10 DIAGNOSIS — M6281 Muscle weakness (generalized): Secondary | ICD-10-CM | POA: Diagnosis not present

## 2022-11-10 DIAGNOSIS — S7292XD Unspecified fracture of left femur, subsequent encounter for closed fracture with routine healing: Secondary | ICD-10-CM | POA: Diagnosis not present

## 2022-11-15 DIAGNOSIS — E669 Obesity, unspecified: Secondary | ICD-10-CM | POA: Diagnosis not present

## 2022-11-15 DIAGNOSIS — E785 Hyperlipidemia, unspecified: Secondary | ICD-10-CM | POA: Diagnosis not present

## 2022-11-16 DIAGNOSIS — I270 Primary pulmonary hypertension: Secondary | ICD-10-CM | POA: Diagnosis not present

## 2022-11-16 DIAGNOSIS — I739 Peripheral vascular disease, unspecified: Secondary | ICD-10-CM | POA: Diagnosis not present

## 2022-11-16 DIAGNOSIS — R634 Abnormal weight loss: Secondary | ICD-10-CM | POA: Diagnosis not present

## 2022-11-16 DIAGNOSIS — I5032 Chronic diastolic (congestive) heart failure: Secondary | ICD-10-CM | POA: Diagnosis not present

## 2022-11-16 DIAGNOSIS — I10 Essential (primary) hypertension: Secondary | ICD-10-CM | POA: Diagnosis not present

## 2022-11-16 DIAGNOSIS — E1165 Type 2 diabetes mellitus with hyperglycemia: Secondary | ICD-10-CM | POA: Diagnosis not present

## 2022-11-16 DIAGNOSIS — I5033 Acute on chronic diastolic (congestive) heart failure: Secondary | ICD-10-CM | POA: Diagnosis not present

## 2022-11-16 DIAGNOSIS — E871 Hypo-osmolality and hyponatremia: Secondary | ICD-10-CM | POA: Diagnosis not present

## 2022-11-16 DIAGNOSIS — D638 Anemia in other chronic diseases classified elsewhere: Secondary | ICD-10-CM | POA: Diagnosis not present

## 2022-11-30 DIAGNOSIS — I10 Essential (primary) hypertension: Secondary | ICD-10-CM | POA: Diagnosis not present

## 2022-11-30 DIAGNOSIS — K5900 Constipation, unspecified: Secondary | ICD-10-CM | POA: Diagnosis not present

## 2022-11-30 DIAGNOSIS — E785 Hyperlipidemia, unspecified: Secondary | ICD-10-CM | POA: Diagnosis not present

## 2022-11-30 DIAGNOSIS — M6281 Muscle weakness (generalized): Secondary | ICD-10-CM | POA: Diagnosis not present

## 2022-11-30 DIAGNOSIS — E119 Type 2 diabetes mellitus without complications: Secondary | ICD-10-CM | POA: Diagnosis not present

## 2022-11-30 DIAGNOSIS — I5032 Chronic diastolic (congestive) heart failure: Secondary | ICD-10-CM | POA: Diagnosis not present

## 2022-12-10 DIAGNOSIS — E669 Obesity, unspecified: Secondary | ICD-10-CM | POA: Diagnosis not present

## 2022-12-10 DIAGNOSIS — E785 Hyperlipidemia, unspecified: Secondary | ICD-10-CM | POA: Diagnosis not present

## 2022-12-14 DIAGNOSIS — I5033 Acute on chronic diastolic (congestive) heart failure: Secondary | ICD-10-CM | POA: Diagnosis not present

## 2022-12-14 DIAGNOSIS — E1165 Type 2 diabetes mellitus with hyperglycemia: Secondary | ICD-10-CM | POA: Diagnosis not present

## 2022-12-14 DIAGNOSIS — E871 Hypo-osmolality and hyponatremia: Secondary | ICD-10-CM | POA: Diagnosis not present

## 2022-12-14 DIAGNOSIS — I739 Peripheral vascular disease, unspecified: Secondary | ICD-10-CM | POA: Diagnosis not present

## 2022-12-14 DIAGNOSIS — R634 Abnormal weight loss: Secondary | ICD-10-CM | POA: Diagnosis not present

## 2022-12-14 DIAGNOSIS — I10 Essential (primary) hypertension: Secondary | ICD-10-CM | POA: Diagnosis not present

## 2022-12-14 DIAGNOSIS — I270 Primary pulmonary hypertension: Secondary | ICD-10-CM | POA: Diagnosis not present

## 2022-12-14 DIAGNOSIS — D638 Anemia in other chronic diseases classified elsewhere: Secondary | ICD-10-CM | POA: Diagnosis not present

## 2022-12-15 DIAGNOSIS — R7989 Other specified abnormal findings of blood chemistry: Secondary | ICD-10-CM | POA: Diagnosis not present

## 2023-01-13 DIAGNOSIS — I270 Primary pulmonary hypertension: Secondary | ICD-10-CM | POA: Diagnosis not present

## 2023-01-13 DIAGNOSIS — D638 Anemia in other chronic diseases classified elsewhere: Secondary | ICD-10-CM | POA: Diagnosis not present

## 2023-01-13 DIAGNOSIS — E1165 Type 2 diabetes mellitus with hyperglycemia: Secondary | ICD-10-CM | POA: Diagnosis not present

## 2023-01-13 DIAGNOSIS — I739 Peripheral vascular disease, unspecified: Secondary | ICD-10-CM | POA: Diagnosis not present

## 2023-01-13 DIAGNOSIS — I5033 Acute on chronic diastolic (congestive) heart failure: Secondary | ICD-10-CM | POA: Diagnosis not present

## 2023-01-13 DIAGNOSIS — I10 Essential (primary) hypertension: Secondary | ICD-10-CM | POA: Diagnosis not present

## 2023-01-13 DIAGNOSIS — E871 Hypo-osmolality and hyponatremia: Secondary | ICD-10-CM | POA: Diagnosis not present

## 2023-02-03 DIAGNOSIS — I5032 Chronic diastolic (congestive) heart failure: Secondary | ICD-10-CM | POA: Diagnosis not present

## 2023-02-03 DIAGNOSIS — M6281 Muscle weakness (generalized): Secondary | ICD-10-CM | POA: Diagnosis not present

## 2023-02-03 DIAGNOSIS — E1165 Type 2 diabetes mellitus with hyperglycemia: Secondary | ICD-10-CM | POA: Diagnosis not present

## 2023-02-03 DIAGNOSIS — I110 Hypertensive heart disease with heart failure: Secondary | ICD-10-CM | POA: Diagnosis not present

## 2023-02-03 DIAGNOSIS — I10 Essential (primary) hypertension: Secondary | ICD-10-CM | POA: Diagnosis not present

## 2023-02-11 DIAGNOSIS — I739 Peripheral vascular disease, unspecified: Secondary | ICD-10-CM | POA: Diagnosis not present

## 2023-02-11 DIAGNOSIS — D638 Anemia in other chronic diseases classified elsewhere: Secondary | ICD-10-CM | POA: Diagnosis not present

## 2023-02-11 DIAGNOSIS — I5033 Acute on chronic diastolic (congestive) heart failure: Secondary | ICD-10-CM | POA: Diagnosis not present

## 2023-02-11 DIAGNOSIS — E1165 Type 2 diabetes mellitus with hyperglycemia: Secondary | ICD-10-CM | POA: Diagnosis not present

## 2023-02-11 DIAGNOSIS — E871 Hypo-osmolality and hyponatremia: Secondary | ICD-10-CM | POA: Diagnosis not present

## 2023-02-11 DIAGNOSIS — I270 Primary pulmonary hypertension: Secondary | ICD-10-CM | POA: Diagnosis not present

## 2023-02-11 DIAGNOSIS — I10 Essential (primary) hypertension: Secondary | ICD-10-CM | POA: Diagnosis not present

## 2023-02-21 DIAGNOSIS — I5033 Acute on chronic diastolic (congestive) heart failure: Secondary | ICD-10-CM | POA: Diagnosis not present

## 2023-02-21 DIAGNOSIS — E1165 Type 2 diabetes mellitus with hyperglycemia: Secondary | ICD-10-CM | POA: Diagnosis not present

## 2023-02-21 DIAGNOSIS — I739 Peripheral vascular disease, unspecified: Secondary | ICD-10-CM | POA: Diagnosis not present

## 2023-02-21 DIAGNOSIS — E871 Hypo-osmolality and hyponatremia: Secondary | ICD-10-CM | POA: Diagnosis not present

## 2023-02-21 DIAGNOSIS — D638 Anemia in other chronic diseases classified elsewhere: Secondary | ICD-10-CM | POA: Diagnosis not present

## 2023-02-21 DIAGNOSIS — I10 Essential (primary) hypertension: Secondary | ICD-10-CM | POA: Diagnosis not present

## 2023-02-21 DIAGNOSIS — I270 Primary pulmonary hypertension: Secondary | ICD-10-CM | POA: Diagnosis not present

## 2023-02-23 DIAGNOSIS — E1165 Type 2 diabetes mellitus with hyperglycemia: Secondary | ICD-10-CM | POA: Diagnosis not present

## 2023-02-23 DIAGNOSIS — E1151 Type 2 diabetes mellitus with diabetic peripheral angiopathy without gangrene: Secondary | ICD-10-CM | POA: Diagnosis not present

## 2023-02-23 DIAGNOSIS — D6489 Other specified anemias: Secondary | ICD-10-CM | POA: Diagnosis not present

## 2023-02-23 DIAGNOSIS — Z7984 Long term (current) use of oral hypoglycemic drugs: Secondary | ICD-10-CM | POA: Diagnosis not present

## 2023-02-23 DIAGNOSIS — I272 Pulmonary hypertension, unspecified: Secondary | ICD-10-CM | POA: Diagnosis not present

## 2023-02-23 DIAGNOSIS — M84352D Stress fracture, left femur, subsequent encounter for fracture with routine healing: Secondary | ICD-10-CM | POA: Diagnosis not present

## 2023-02-23 DIAGNOSIS — Z9181 History of falling: Secondary | ICD-10-CM | POA: Diagnosis not present

## 2023-02-23 DIAGNOSIS — N390 Urinary tract infection, site not specified: Secondary | ICD-10-CM | POA: Diagnosis not present

## 2023-02-23 DIAGNOSIS — I5033 Acute on chronic diastolic (congestive) heart failure: Secondary | ICD-10-CM | POA: Diagnosis not present

## 2023-02-23 DIAGNOSIS — U071 COVID-19: Secondary | ICD-10-CM | POA: Diagnosis not present

## 2023-02-23 DIAGNOSIS — I110 Hypertensive heart disease with heart failure: Secondary | ICD-10-CM | POA: Diagnosis not present

## 2023-02-24 DIAGNOSIS — E782 Mixed hyperlipidemia: Secondary | ICD-10-CM | POA: Diagnosis not present

## 2023-02-24 DIAGNOSIS — I10 Essential (primary) hypertension: Secondary | ICD-10-CM | POA: Diagnosis not present

## 2023-02-28 DIAGNOSIS — E1165 Type 2 diabetes mellitus with hyperglycemia: Secondary | ICD-10-CM | POA: Diagnosis not present

## 2023-02-28 DIAGNOSIS — I5033 Acute on chronic diastolic (congestive) heart failure: Secondary | ICD-10-CM | POA: Diagnosis not present

## 2023-02-28 DIAGNOSIS — E1151 Type 2 diabetes mellitus with diabetic peripheral angiopathy without gangrene: Secondary | ICD-10-CM | POA: Diagnosis not present

## 2023-02-28 DIAGNOSIS — I110 Hypertensive heart disease with heart failure: Secondary | ICD-10-CM | POA: Diagnosis not present

## 2023-02-28 DIAGNOSIS — M84352D Stress fracture, left femur, subsequent encounter for fracture with routine healing: Secondary | ICD-10-CM | POA: Diagnosis not present

## 2023-03-14 DIAGNOSIS — Z6836 Body mass index (BMI) 36.0-36.9, adult: Secondary | ICD-10-CM | POA: Diagnosis not present

## 2023-03-14 DIAGNOSIS — Z1159 Encounter for screening for other viral diseases: Secondary | ICD-10-CM | POA: Diagnosis not present

## 2023-03-14 DIAGNOSIS — N39498 Other specified urinary incontinence: Secondary | ICD-10-CM | POA: Diagnosis not present

## 2023-03-14 DIAGNOSIS — R7309 Other abnormal glucose: Secondary | ICD-10-CM | POA: Diagnosis not present

## 2023-03-14 DIAGNOSIS — H9193 Unspecified hearing loss, bilateral: Secondary | ICD-10-CM | POA: Diagnosis not present

## 2023-03-14 DIAGNOSIS — I10 Essential (primary) hypertension: Secondary | ICD-10-CM | POA: Diagnosis not present

## 2023-03-14 DIAGNOSIS — Z1321 Encounter for screening for nutritional disorder: Secondary | ICD-10-CM | POA: Diagnosis not present

## 2023-03-14 DIAGNOSIS — Z79899 Other long term (current) drug therapy: Secondary | ICD-10-CM | POA: Diagnosis not present

## 2023-03-14 DIAGNOSIS — Z7689 Persons encountering health services in other specified circumstances: Secondary | ICD-10-CM | POA: Diagnosis not present

## 2023-03-22 DIAGNOSIS — I5033 Acute on chronic diastolic (congestive) heart failure: Secondary | ICD-10-CM | POA: Diagnosis not present

## 2023-03-22 DIAGNOSIS — E1151 Type 2 diabetes mellitus with diabetic peripheral angiopathy without gangrene: Secondary | ICD-10-CM | POA: Diagnosis not present

## 2023-03-22 DIAGNOSIS — E1165 Type 2 diabetes mellitus with hyperglycemia: Secondary | ICD-10-CM | POA: Diagnosis not present

## 2023-03-22 DIAGNOSIS — I110 Hypertensive heart disease with heart failure: Secondary | ICD-10-CM | POA: Diagnosis not present

## 2023-03-22 DIAGNOSIS — M84352D Stress fracture, left femur, subsequent encounter for fracture with routine healing: Secondary | ICD-10-CM | POA: Diagnosis not present

## 2023-03-23 DIAGNOSIS — E1151 Type 2 diabetes mellitus with diabetic peripheral angiopathy without gangrene: Secondary | ICD-10-CM | POA: Diagnosis not present

## 2023-03-23 DIAGNOSIS — M84352D Stress fracture, left femur, subsequent encounter for fracture with routine healing: Secondary | ICD-10-CM | POA: Diagnosis not present

## 2023-03-23 DIAGNOSIS — I5033 Acute on chronic diastolic (congestive) heart failure: Secondary | ICD-10-CM | POA: Diagnosis not present

## 2023-03-23 DIAGNOSIS — I110 Hypertensive heart disease with heart failure: Secondary | ICD-10-CM | POA: Diagnosis not present

## 2023-03-23 DIAGNOSIS — E1165 Type 2 diabetes mellitus with hyperglycemia: Secondary | ICD-10-CM | POA: Diagnosis not present

## 2023-03-24 DIAGNOSIS — M84352D Stress fracture, left femur, subsequent encounter for fracture with routine healing: Secondary | ICD-10-CM | POA: Diagnosis not present

## 2023-03-24 DIAGNOSIS — E1165 Type 2 diabetes mellitus with hyperglycemia: Secondary | ICD-10-CM | POA: Diagnosis not present

## 2023-03-24 DIAGNOSIS — I110 Hypertensive heart disease with heart failure: Secondary | ICD-10-CM | POA: Diagnosis not present

## 2023-03-24 DIAGNOSIS — I5033 Acute on chronic diastolic (congestive) heart failure: Secondary | ICD-10-CM | POA: Diagnosis not present

## 2023-03-24 DIAGNOSIS — E1151 Type 2 diabetes mellitus with diabetic peripheral angiopathy without gangrene: Secondary | ICD-10-CM | POA: Diagnosis not present

## 2023-03-25 DIAGNOSIS — Z7984 Long term (current) use of oral hypoglycemic drugs: Secondary | ICD-10-CM | POA: Diagnosis not present

## 2023-03-25 DIAGNOSIS — E1165 Type 2 diabetes mellitus with hyperglycemia: Secondary | ICD-10-CM | POA: Diagnosis not present

## 2023-03-25 DIAGNOSIS — D6489 Other specified anemias: Secondary | ICD-10-CM | POA: Diagnosis not present

## 2023-03-25 DIAGNOSIS — I110 Hypertensive heart disease with heart failure: Secondary | ICD-10-CM | POA: Diagnosis not present

## 2023-03-25 DIAGNOSIS — I272 Pulmonary hypertension, unspecified: Secondary | ICD-10-CM | POA: Diagnosis not present

## 2023-03-25 DIAGNOSIS — M84352D Stress fracture, left femur, subsequent encounter for fracture with routine healing: Secondary | ICD-10-CM | POA: Diagnosis not present

## 2023-03-25 DIAGNOSIS — E1151 Type 2 diabetes mellitus with diabetic peripheral angiopathy without gangrene: Secondary | ICD-10-CM | POA: Diagnosis not present

## 2023-03-25 DIAGNOSIS — U071 COVID-19: Secondary | ICD-10-CM | POA: Diagnosis not present

## 2023-03-25 DIAGNOSIS — Z9181 History of falling: Secondary | ICD-10-CM | POA: Diagnosis not present

## 2023-03-25 DIAGNOSIS — N390 Urinary tract infection, site not specified: Secondary | ICD-10-CM | POA: Diagnosis not present

## 2023-03-25 DIAGNOSIS — I5033 Acute on chronic diastolic (congestive) heart failure: Secondary | ICD-10-CM | POA: Diagnosis not present

## 2023-03-28 DIAGNOSIS — I5033 Acute on chronic diastolic (congestive) heart failure: Secondary | ICD-10-CM | POA: Diagnosis not present

## 2023-03-28 DIAGNOSIS — I110 Hypertensive heart disease with heart failure: Secondary | ICD-10-CM | POA: Diagnosis not present

## 2023-03-28 DIAGNOSIS — E1165 Type 2 diabetes mellitus with hyperglycemia: Secondary | ICD-10-CM | POA: Diagnosis not present

## 2023-03-28 DIAGNOSIS — M84352D Stress fracture, left femur, subsequent encounter for fracture with routine healing: Secondary | ICD-10-CM | POA: Diagnosis not present

## 2023-03-28 DIAGNOSIS — E1151 Type 2 diabetes mellitus with diabetic peripheral angiopathy without gangrene: Secondary | ICD-10-CM | POA: Diagnosis not present

## 2023-03-29 DIAGNOSIS — E1151 Type 2 diabetes mellitus with diabetic peripheral angiopathy without gangrene: Secondary | ICD-10-CM | POA: Diagnosis not present

## 2023-03-29 DIAGNOSIS — M84352D Stress fracture, left femur, subsequent encounter for fracture with routine healing: Secondary | ICD-10-CM | POA: Diagnosis not present

## 2023-03-29 DIAGNOSIS — I5033 Acute on chronic diastolic (congestive) heart failure: Secondary | ICD-10-CM | POA: Diagnosis not present

## 2023-03-29 DIAGNOSIS — I110 Hypertensive heart disease with heart failure: Secondary | ICD-10-CM | POA: Diagnosis not present

## 2023-03-29 DIAGNOSIS — E1165 Type 2 diabetes mellitus with hyperglycemia: Secondary | ICD-10-CM | POA: Diagnosis not present

## 2023-03-30 DIAGNOSIS — E1151 Type 2 diabetes mellitus with diabetic peripheral angiopathy without gangrene: Secondary | ICD-10-CM | POA: Diagnosis not present

## 2023-03-30 DIAGNOSIS — E1165 Type 2 diabetes mellitus with hyperglycemia: Secondary | ICD-10-CM | POA: Diagnosis not present

## 2023-03-30 DIAGNOSIS — I110 Hypertensive heart disease with heart failure: Secondary | ICD-10-CM | POA: Diagnosis not present

## 2023-03-30 DIAGNOSIS — I5033 Acute on chronic diastolic (congestive) heart failure: Secondary | ICD-10-CM | POA: Diagnosis not present

## 2023-03-30 DIAGNOSIS — M84352D Stress fracture, left femur, subsequent encounter for fracture with routine healing: Secondary | ICD-10-CM | POA: Diagnosis not present

## 2023-03-31 DIAGNOSIS — M84352D Stress fracture, left femur, subsequent encounter for fracture with routine healing: Secondary | ICD-10-CM | POA: Diagnosis not present

## 2023-03-31 DIAGNOSIS — E1165 Type 2 diabetes mellitus with hyperglycemia: Secondary | ICD-10-CM | POA: Diagnosis not present

## 2023-03-31 DIAGNOSIS — I110 Hypertensive heart disease with heart failure: Secondary | ICD-10-CM | POA: Diagnosis not present

## 2023-03-31 DIAGNOSIS — E1151 Type 2 diabetes mellitus with diabetic peripheral angiopathy without gangrene: Secondary | ICD-10-CM | POA: Diagnosis not present

## 2023-03-31 DIAGNOSIS — I5033 Acute on chronic diastolic (congestive) heart failure: Secondary | ICD-10-CM | POA: Diagnosis not present

## 2023-04-01 DIAGNOSIS — M84352D Stress fracture, left femur, subsequent encounter for fracture with routine healing: Secondary | ICD-10-CM | POA: Diagnosis not present

## 2023-04-01 DIAGNOSIS — E1165 Type 2 diabetes mellitus with hyperglycemia: Secondary | ICD-10-CM | POA: Diagnosis not present

## 2023-04-01 DIAGNOSIS — I110 Hypertensive heart disease with heart failure: Secondary | ICD-10-CM | POA: Diagnosis not present

## 2023-04-01 DIAGNOSIS — E1151 Type 2 diabetes mellitus with diabetic peripheral angiopathy without gangrene: Secondary | ICD-10-CM | POA: Diagnosis not present

## 2023-04-01 DIAGNOSIS — I5033 Acute on chronic diastolic (congestive) heart failure: Secondary | ICD-10-CM | POA: Diagnosis not present

## 2023-04-04 DIAGNOSIS — I10 Essential (primary) hypertension: Secondary | ICD-10-CM | POA: Diagnosis not present

## 2023-04-04 DIAGNOSIS — M84352D Stress fracture, left femur, subsequent encounter for fracture with routine healing: Secondary | ICD-10-CM | POA: Diagnosis not present

## 2023-04-04 DIAGNOSIS — I5033 Acute on chronic diastolic (congestive) heart failure: Secondary | ICD-10-CM | POA: Diagnosis not present

## 2023-04-04 DIAGNOSIS — I110 Hypertensive heart disease with heart failure: Secondary | ICD-10-CM | POA: Diagnosis not present

## 2023-04-04 DIAGNOSIS — E1151 Type 2 diabetes mellitus with diabetic peripheral angiopathy without gangrene: Secondary | ICD-10-CM | POA: Diagnosis not present

## 2023-04-04 DIAGNOSIS — N39498 Other specified urinary incontinence: Secondary | ICD-10-CM | POA: Diagnosis not present

## 2023-04-04 DIAGNOSIS — Z7189 Other specified counseling: Secondary | ICD-10-CM | POA: Diagnosis not present

## 2023-04-04 DIAGNOSIS — I509 Heart failure, unspecified: Secondary | ICD-10-CM | POA: Diagnosis not present

## 2023-04-04 DIAGNOSIS — H9193 Unspecified hearing loss, bilateral: Secondary | ICD-10-CM | POA: Diagnosis not present

## 2023-04-04 DIAGNOSIS — Z0001 Encounter for general adult medical examination with abnormal findings: Secondary | ICD-10-CM | POA: Diagnosis not present

## 2023-04-04 DIAGNOSIS — E1165 Type 2 diabetes mellitus with hyperglycemia: Secondary | ICD-10-CM | POA: Diagnosis not present

## 2023-04-05 DIAGNOSIS — M84352D Stress fracture, left femur, subsequent encounter for fracture with routine healing: Secondary | ICD-10-CM | POA: Diagnosis not present

## 2023-04-05 DIAGNOSIS — R7309 Other abnormal glucose: Secondary | ICD-10-CM | POA: Diagnosis not present

## 2023-04-05 DIAGNOSIS — E1151 Type 2 diabetes mellitus with diabetic peripheral angiopathy without gangrene: Secondary | ICD-10-CM | POA: Diagnosis not present

## 2023-04-05 DIAGNOSIS — I110 Hypertensive heart disease with heart failure: Secondary | ICD-10-CM | POA: Diagnosis not present

## 2023-04-05 DIAGNOSIS — Z79899 Other long term (current) drug therapy: Secondary | ICD-10-CM | POA: Diagnosis not present

## 2023-04-05 DIAGNOSIS — E1165 Type 2 diabetes mellitus with hyperglycemia: Secondary | ICD-10-CM | POA: Diagnosis not present

## 2023-04-05 DIAGNOSIS — I5033 Acute on chronic diastolic (congestive) heart failure: Secondary | ICD-10-CM | POA: Diagnosis not present

## 2023-04-05 DIAGNOSIS — Z1159 Encounter for screening for other viral diseases: Secondary | ICD-10-CM | POA: Diagnosis not present

## 2023-04-05 DIAGNOSIS — Z1321 Encounter for screening for nutritional disorder: Secondary | ICD-10-CM | POA: Diagnosis not present

## 2023-04-07 DIAGNOSIS — I110 Hypertensive heart disease with heart failure: Secondary | ICD-10-CM | POA: Diagnosis not present

## 2023-04-07 DIAGNOSIS — I5033 Acute on chronic diastolic (congestive) heart failure: Secondary | ICD-10-CM | POA: Diagnosis not present

## 2023-04-07 DIAGNOSIS — E1165 Type 2 diabetes mellitus with hyperglycemia: Secondary | ICD-10-CM | POA: Diagnosis not present

## 2023-04-07 DIAGNOSIS — M84352D Stress fracture, left femur, subsequent encounter for fracture with routine healing: Secondary | ICD-10-CM | POA: Diagnosis not present

## 2023-04-07 DIAGNOSIS — E1151 Type 2 diabetes mellitus with diabetic peripheral angiopathy without gangrene: Secondary | ICD-10-CM | POA: Diagnosis not present

## 2023-04-12 DIAGNOSIS — E1165 Type 2 diabetes mellitus with hyperglycemia: Secondary | ICD-10-CM | POA: Diagnosis not present

## 2023-04-12 DIAGNOSIS — E1151 Type 2 diabetes mellitus with diabetic peripheral angiopathy without gangrene: Secondary | ICD-10-CM | POA: Diagnosis not present

## 2023-04-12 DIAGNOSIS — I5033 Acute on chronic diastolic (congestive) heart failure: Secondary | ICD-10-CM | POA: Diagnosis not present

## 2023-04-12 DIAGNOSIS — I110 Hypertensive heart disease with heart failure: Secondary | ICD-10-CM | POA: Diagnosis not present

## 2023-04-12 DIAGNOSIS — M84352D Stress fracture, left femur, subsequent encounter for fracture with routine healing: Secondary | ICD-10-CM | POA: Diagnosis not present

## 2023-04-13 DIAGNOSIS — E1151 Type 2 diabetes mellitus with diabetic peripheral angiopathy without gangrene: Secondary | ICD-10-CM | POA: Diagnosis not present

## 2023-04-13 DIAGNOSIS — I5033 Acute on chronic diastolic (congestive) heart failure: Secondary | ICD-10-CM | POA: Diagnosis not present

## 2023-04-13 DIAGNOSIS — I110 Hypertensive heart disease with heart failure: Secondary | ICD-10-CM | POA: Diagnosis not present

## 2023-04-13 DIAGNOSIS — E1165 Type 2 diabetes mellitus with hyperglycemia: Secondary | ICD-10-CM | POA: Diagnosis not present

## 2023-04-13 DIAGNOSIS — M84352D Stress fracture, left femur, subsequent encounter for fracture with routine healing: Secondary | ICD-10-CM | POA: Diagnosis not present

## 2023-04-14 DIAGNOSIS — E1151 Type 2 diabetes mellitus with diabetic peripheral angiopathy without gangrene: Secondary | ICD-10-CM | POA: Diagnosis not present

## 2023-04-14 DIAGNOSIS — I5033 Acute on chronic diastolic (congestive) heart failure: Secondary | ICD-10-CM | POA: Diagnosis not present

## 2023-04-14 DIAGNOSIS — I110 Hypertensive heart disease with heart failure: Secondary | ICD-10-CM | POA: Diagnosis not present

## 2023-04-14 DIAGNOSIS — E1165 Type 2 diabetes mellitus with hyperglycemia: Secondary | ICD-10-CM | POA: Diagnosis not present

## 2023-04-14 DIAGNOSIS — M84352D Stress fracture, left femur, subsequent encounter for fracture with routine healing: Secondary | ICD-10-CM | POA: Diagnosis not present

## 2023-04-19 DIAGNOSIS — E1151 Type 2 diabetes mellitus with diabetic peripheral angiopathy without gangrene: Secondary | ICD-10-CM | POA: Diagnosis not present

## 2023-04-19 DIAGNOSIS — M84352D Stress fracture, left femur, subsequent encounter for fracture with routine healing: Secondary | ICD-10-CM | POA: Diagnosis not present

## 2023-04-19 DIAGNOSIS — I5033 Acute on chronic diastolic (congestive) heart failure: Secondary | ICD-10-CM | POA: Diagnosis not present

## 2023-04-19 DIAGNOSIS — I110 Hypertensive heart disease with heart failure: Secondary | ICD-10-CM | POA: Diagnosis not present

## 2023-04-19 DIAGNOSIS — E1165 Type 2 diabetes mellitus with hyperglycemia: Secondary | ICD-10-CM | POA: Diagnosis not present

## 2023-04-20 DIAGNOSIS — E1165 Type 2 diabetes mellitus with hyperglycemia: Secondary | ICD-10-CM | POA: Diagnosis not present

## 2023-04-20 DIAGNOSIS — I110 Hypertensive heart disease with heart failure: Secondary | ICD-10-CM | POA: Diagnosis not present

## 2023-04-20 DIAGNOSIS — M84352D Stress fracture, left femur, subsequent encounter for fracture with routine healing: Secondary | ICD-10-CM | POA: Diagnosis not present

## 2023-04-20 DIAGNOSIS — I5033 Acute on chronic diastolic (congestive) heart failure: Secondary | ICD-10-CM | POA: Diagnosis not present

## 2023-04-20 DIAGNOSIS — E1151 Type 2 diabetes mellitus with diabetic peripheral angiopathy without gangrene: Secondary | ICD-10-CM | POA: Diagnosis not present

## 2023-04-24 DIAGNOSIS — E1165 Type 2 diabetes mellitus with hyperglycemia: Secondary | ICD-10-CM | POA: Diagnosis not present

## 2023-04-24 DIAGNOSIS — U071 COVID-19: Secondary | ICD-10-CM | POA: Diagnosis not present

## 2023-04-24 DIAGNOSIS — E1151 Type 2 diabetes mellitus with diabetic peripheral angiopathy without gangrene: Secondary | ICD-10-CM | POA: Diagnosis not present

## 2023-04-24 DIAGNOSIS — Z6836 Body mass index (BMI) 36.0-36.9, adult: Secondary | ICD-10-CM | POA: Diagnosis not present

## 2023-04-24 DIAGNOSIS — I5033 Acute on chronic diastolic (congestive) heart failure: Secondary | ICD-10-CM | POA: Diagnosis not present

## 2023-04-24 DIAGNOSIS — Z7984 Long term (current) use of oral hypoglycemic drugs: Secondary | ICD-10-CM | POA: Diagnosis not present

## 2023-04-24 DIAGNOSIS — I110 Hypertensive heart disease with heart failure: Secondary | ICD-10-CM | POA: Diagnosis not present

## 2023-04-24 DIAGNOSIS — M84352D Stress fracture, left femur, subsequent encounter for fracture with routine healing: Secondary | ICD-10-CM | POA: Diagnosis not present

## 2023-04-24 DIAGNOSIS — I272 Pulmonary hypertension, unspecified: Secondary | ICD-10-CM | POA: Diagnosis not present

## 2023-04-24 DIAGNOSIS — D6489 Other specified anemias: Secondary | ICD-10-CM | POA: Diagnosis not present

## 2023-04-24 DIAGNOSIS — N390 Urinary tract infection, site not specified: Secondary | ICD-10-CM | POA: Diagnosis not present

## 2023-04-27 DIAGNOSIS — I110 Hypertensive heart disease with heart failure: Secondary | ICD-10-CM | POA: Diagnosis not present

## 2023-04-27 DIAGNOSIS — I5033 Acute on chronic diastolic (congestive) heart failure: Secondary | ICD-10-CM | POA: Diagnosis not present

## 2023-04-27 DIAGNOSIS — E1151 Type 2 diabetes mellitus with diabetic peripheral angiopathy without gangrene: Secondary | ICD-10-CM | POA: Diagnosis not present

## 2023-04-27 DIAGNOSIS — E1165 Type 2 diabetes mellitus with hyperglycemia: Secondary | ICD-10-CM | POA: Diagnosis not present

## 2023-04-27 DIAGNOSIS — I272 Pulmonary hypertension, unspecified: Secondary | ICD-10-CM | POA: Diagnosis not present

## 2023-05-04 DIAGNOSIS — I110 Hypertensive heart disease with heart failure: Secondary | ICD-10-CM | POA: Diagnosis not present

## 2023-05-04 DIAGNOSIS — E1165 Type 2 diabetes mellitus with hyperglycemia: Secondary | ICD-10-CM | POA: Diagnosis not present

## 2023-05-04 DIAGNOSIS — I5033 Acute on chronic diastolic (congestive) heart failure: Secondary | ICD-10-CM | POA: Diagnosis not present

## 2023-05-04 DIAGNOSIS — E1151 Type 2 diabetes mellitus with diabetic peripheral angiopathy without gangrene: Secondary | ICD-10-CM | POA: Diagnosis not present

## 2023-05-04 DIAGNOSIS — I272 Pulmonary hypertension, unspecified: Secondary | ICD-10-CM | POA: Diagnosis not present

## 2023-05-05 DIAGNOSIS — H547 Unspecified visual loss: Secondary | ICD-10-CM | POA: Diagnosis not present

## 2023-05-05 DIAGNOSIS — I509 Heart failure, unspecified: Secondary | ICD-10-CM | POA: Diagnosis not present

## 2023-05-05 DIAGNOSIS — E1151 Type 2 diabetes mellitus with diabetic peripheral angiopathy without gangrene: Secondary | ICD-10-CM | POA: Diagnosis not present

## 2023-05-05 DIAGNOSIS — R413 Other amnesia: Secondary | ICD-10-CM | POA: Diagnosis not present

## 2023-05-05 DIAGNOSIS — Z993 Dependence on wheelchair: Secondary | ICD-10-CM | POA: Diagnosis not present

## 2023-05-05 DIAGNOSIS — I10 Essential (primary) hypertension: Secondary | ICD-10-CM | POA: Diagnosis not present

## 2023-05-13 DIAGNOSIS — E1165 Type 2 diabetes mellitus with hyperglycemia: Secondary | ICD-10-CM | POA: Diagnosis not present

## 2023-05-13 DIAGNOSIS — I5033 Acute on chronic diastolic (congestive) heart failure: Secondary | ICD-10-CM | POA: Diagnosis not present

## 2023-05-13 DIAGNOSIS — I110 Hypertensive heart disease with heart failure: Secondary | ICD-10-CM | POA: Diagnosis not present

## 2023-05-13 DIAGNOSIS — I272 Pulmonary hypertension, unspecified: Secondary | ICD-10-CM | POA: Diagnosis not present

## 2023-05-13 DIAGNOSIS — E1151 Type 2 diabetes mellitus with diabetic peripheral angiopathy without gangrene: Secondary | ICD-10-CM | POA: Diagnosis not present

## 2023-05-16 DIAGNOSIS — I272 Pulmonary hypertension, unspecified: Secondary | ICD-10-CM | POA: Diagnosis not present

## 2023-05-16 DIAGNOSIS — E1151 Type 2 diabetes mellitus with diabetic peripheral angiopathy without gangrene: Secondary | ICD-10-CM | POA: Diagnosis not present

## 2023-05-16 DIAGNOSIS — I110 Hypertensive heart disease with heart failure: Secondary | ICD-10-CM | POA: Diagnosis not present

## 2023-05-16 DIAGNOSIS — E1165 Type 2 diabetes mellitus with hyperglycemia: Secondary | ICD-10-CM | POA: Diagnosis not present

## 2023-05-16 DIAGNOSIS — I5033 Acute on chronic diastolic (congestive) heart failure: Secondary | ICD-10-CM | POA: Diagnosis not present

## 2023-05-24 DIAGNOSIS — I110 Hypertensive heart disease with heart failure: Secondary | ICD-10-CM | POA: Diagnosis not present

## 2023-05-24 DIAGNOSIS — Z6836 Body mass index (BMI) 36.0-36.9, adult: Secondary | ICD-10-CM | POA: Diagnosis not present

## 2023-05-24 DIAGNOSIS — U071 COVID-19: Secondary | ICD-10-CM | POA: Diagnosis not present

## 2023-05-24 DIAGNOSIS — I272 Pulmonary hypertension, unspecified: Secondary | ICD-10-CM | POA: Diagnosis not present

## 2023-05-24 DIAGNOSIS — Z7984 Long term (current) use of oral hypoglycemic drugs: Secondary | ICD-10-CM | POA: Diagnosis not present

## 2023-05-24 DIAGNOSIS — M84352D Stress fracture, left femur, subsequent encounter for fracture with routine healing: Secondary | ICD-10-CM | POA: Diagnosis not present

## 2023-05-24 DIAGNOSIS — D6489 Other specified anemias: Secondary | ICD-10-CM | POA: Diagnosis not present

## 2023-05-24 DIAGNOSIS — N390 Urinary tract infection, site not specified: Secondary | ICD-10-CM | POA: Diagnosis not present

## 2023-05-24 DIAGNOSIS — E1165 Type 2 diabetes mellitus with hyperglycemia: Secondary | ICD-10-CM | POA: Diagnosis not present

## 2023-05-24 DIAGNOSIS — I5033 Acute on chronic diastolic (congestive) heart failure: Secondary | ICD-10-CM | POA: Diagnosis not present

## 2023-05-24 DIAGNOSIS — E1151 Type 2 diabetes mellitus with diabetic peripheral angiopathy without gangrene: Secondary | ICD-10-CM | POA: Diagnosis not present

## 2023-06-10 DIAGNOSIS — I110 Hypertensive heart disease with heart failure: Secondary | ICD-10-CM | POA: Diagnosis not present

## 2023-06-10 DIAGNOSIS — E1165 Type 2 diabetes mellitus with hyperglycemia: Secondary | ICD-10-CM | POA: Diagnosis not present

## 2023-06-10 DIAGNOSIS — I272 Pulmonary hypertension, unspecified: Secondary | ICD-10-CM | POA: Diagnosis not present

## 2023-06-10 DIAGNOSIS — E1151 Type 2 diabetes mellitus with diabetic peripheral angiopathy without gangrene: Secondary | ICD-10-CM | POA: Diagnosis not present

## 2023-06-10 DIAGNOSIS — I5033 Acute on chronic diastolic (congestive) heart failure: Secondary | ICD-10-CM | POA: Diagnosis not present

## 2024-03-27 ENCOUNTER — Emergency Department (HOSPITAL_COMMUNITY)

## 2024-03-27 ENCOUNTER — Encounter (HOSPITAL_COMMUNITY): Payer: Self-pay

## 2024-03-27 ENCOUNTER — Other Ambulatory Visit: Payer: Self-pay

## 2024-03-27 ENCOUNTER — Inpatient Hospital Stay (HOSPITAL_COMMUNITY)
Admission: EM | Admit: 2024-03-27 | Discharge: 2024-04-18 | DRG: 871 | Disposition: E | Attending: Internal Medicine | Admitting: Internal Medicine

## 2024-03-27 DIAGNOSIS — Z1629 Resistance to other single specified antibiotic: Secondary | ICD-10-CM | POA: Diagnosis present

## 2024-03-27 DIAGNOSIS — S7292XD Unspecified fracture of left femur, subsequent encounter for closed fracture with routine healing: Secondary | ICD-10-CM

## 2024-03-27 DIAGNOSIS — R627 Adult failure to thrive: Secondary | ICD-10-CM | POA: Diagnosis present

## 2024-03-27 DIAGNOSIS — Z1611 Resistance to penicillins: Secondary | ICD-10-CM | POA: Diagnosis present

## 2024-03-27 DIAGNOSIS — K5641 Fecal impaction: Secondary | ICD-10-CM | POA: Diagnosis present

## 2024-03-27 DIAGNOSIS — E1165 Type 2 diabetes mellitus with hyperglycemia: Secondary | ICD-10-CM | POA: Diagnosis present

## 2024-03-27 DIAGNOSIS — Z7189 Other specified counseling: Secondary | ICD-10-CM

## 2024-03-27 DIAGNOSIS — E66812 Obesity, class 2: Secondary | ICD-10-CM | POA: Diagnosis present

## 2024-03-27 DIAGNOSIS — E11649 Type 2 diabetes mellitus with hypoglycemia without coma: Secondary | ICD-10-CM | POA: Diagnosis not present

## 2024-03-27 DIAGNOSIS — N10 Acute tubulo-interstitial nephritis: Secondary | ICD-10-CM

## 2024-03-27 DIAGNOSIS — I4891 Unspecified atrial fibrillation: Secondary | ICD-10-CM

## 2024-03-27 DIAGNOSIS — J9601 Acute respiratory failure with hypoxia: Secondary | ICD-10-CM

## 2024-03-27 DIAGNOSIS — Z7901 Long term (current) use of anticoagulants: Secondary | ICD-10-CM

## 2024-03-27 DIAGNOSIS — L8990 Pressure ulcer of unspecified site, unspecified stage: Secondary | ICD-10-CM | POA: Insufficient documentation

## 2024-03-27 DIAGNOSIS — A4151 Sepsis due to Escherichia coli [E. coli]: Secondary | ICD-10-CM | POA: Diagnosis present

## 2024-03-27 DIAGNOSIS — E860 Dehydration: Secondary | ICD-10-CM | POA: Diagnosis present

## 2024-03-27 DIAGNOSIS — G9341 Metabolic encephalopathy: Secondary | ICD-10-CM | POA: Diagnosis present

## 2024-03-27 DIAGNOSIS — Z87440 Personal history of urinary (tract) infections: Secondary | ICD-10-CM

## 2024-03-27 DIAGNOSIS — R6521 Severe sepsis with septic shock: Secondary | ICD-10-CM | POA: Diagnosis present

## 2024-03-27 DIAGNOSIS — Z96653 Presence of artificial knee joint, bilateral: Secondary | ICD-10-CM | POA: Diagnosis present

## 2024-03-27 DIAGNOSIS — Z7982 Long term (current) use of aspirin: Secondary | ICD-10-CM

## 2024-03-27 DIAGNOSIS — Z833 Family history of diabetes mellitus: Secondary | ICD-10-CM

## 2024-03-27 DIAGNOSIS — K209 Esophagitis, unspecified without bleeding: Secondary | ICD-10-CM | POA: Diagnosis present

## 2024-03-27 DIAGNOSIS — E1151 Type 2 diabetes mellitus with diabetic peripheral angiopathy without gangrene: Secondary | ICD-10-CM | POA: Diagnosis present

## 2024-03-27 DIAGNOSIS — A4159 Other Gram-negative sepsis: Secondary | ICD-10-CM | POA: Diagnosis not present

## 2024-03-27 DIAGNOSIS — A419 Sepsis, unspecified organism: Principal | ICD-10-CM | POA: Diagnosis present

## 2024-03-27 DIAGNOSIS — Z79899 Other long term (current) drug therapy: Secondary | ICD-10-CM

## 2024-03-27 DIAGNOSIS — K4090 Unilateral inguinal hernia, without obstruction or gangrene, not specified as recurrent: Secondary | ICD-10-CM | POA: Diagnosis present

## 2024-03-27 DIAGNOSIS — K5289 Other specified noninfective gastroenteritis and colitis: Secondary | ICD-10-CM | POA: Diagnosis present

## 2024-03-27 DIAGNOSIS — Z7401 Bed confinement status: Secondary | ICD-10-CM

## 2024-03-27 DIAGNOSIS — K7682 Hepatic encephalopathy: Secondary | ICD-10-CM | POA: Diagnosis present

## 2024-03-27 DIAGNOSIS — Z8249 Family history of ischemic heart disease and other diseases of the circulatory system: Secondary | ICD-10-CM

## 2024-03-27 DIAGNOSIS — E875 Hyperkalemia: Secondary | ICD-10-CM | POA: Diagnosis not present

## 2024-03-27 DIAGNOSIS — E440 Moderate protein-calorie malnutrition: Secondary | ICD-10-CM | POA: Diagnosis present

## 2024-03-27 DIAGNOSIS — I110 Hypertensive heart disease with heart failure: Secondary | ICD-10-CM | POA: Diagnosis present

## 2024-03-27 DIAGNOSIS — D649 Anemia, unspecified: Secondary | ICD-10-CM | POA: Diagnosis present

## 2024-03-27 DIAGNOSIS — Z6838 Body mass index (BMI) 38.0-38.9, adult: Secondary | ICD-10-CM

## 2024-03-27 DIAGNOSIS — R4182 Altered mental status, unspecified: Secondary | ICD-10-CM | POA: Diagnosis not present

## 2024-03-27 DIAGNOSIS — L249 Irritant contact dermatitis, unspecified cause: Secondary | ICD-10-CM | POA: Diagnosis present

## 2024-03-27 DIAGNOSIS — Z8349 Family history of other endocrine, nutritional and metabolic diseases: Secondary | ICD-10-CM

## 2024-03-27 DIAGNOSIS — J9602 Acute respiratory failure with hypercapnia: Secondary | ICD-10-CM | POA: Diagnosis present

## 2024-03-27 DIAGNOSIS — K7460 Unspecified cirrhosis of liver: Secondary | ICD-10-CM | POA: Diagnosis present

## 2024-03-27 DIAGNOSIS — Z90710 Acquired absence of both cervix and uterus: Secondary | ICD-10-CM

## 2024-03-27 DIAGNOSIS — Z66 Do not resuscitate: Secondary | ICD-10-CM | POA: Diagnosis not present

## 2024-03-27 DIAGNOSIS — I5032 Chronic diastolic (congestive) heart failure: Secondary | ICD-10-CM | POA: Diagnosis present

## 2024-03-27 DIAGNOSIS — E876 Hypokalemia: Secondary | ICD-10-CM | POA: Diagnosis not present

## 2024-03-27 DIAGNOSIS — N179 Acute kidney failure, unspecified: Secondary | ICD-10-CM | POA: Diagnosis present

## 2024-03-27 DIAGNOSIS — I480 Paroxysmal atrial fibrillation: Secondary | ICD-10-CM | POA: Diagnosis present

## 2024-03-27 DIAGNOSIS — N136 Pyonephrosis: Secondary | ICD-10-CM | POA: Diagnosis present

## 2024-03-27 DIAGNOSIS — E872 Acidosis, unspecified: Secondary | ICD-10-CM | POA: Diagnosis present

## 2024-03-27 DIAGNOSIS — R54 Age-related physical debility: Secondary | ICD-10-CM | POA: Diagnosis present

## 2024-03-27 LAB — CBC WITH DIFFERENTIAL/PLATELET
Abs Immature Granulocytes: 0.15 K/uL — ABNORMAL HIGH (ref 0.00–0.07)
Basophils Absolute: 0 K/uL (ref 0.0–0.1)
Basophils Relative: 0 %
Eosinophils Absolute: 0 K/uL (ref 0.0–0.5)
Eosinophils Relative: 0 %
HCT: 48.5 % — ABNORMAL HIGH (ref 36.0–46.0)
Hemoglobin: 14.9 g/dL (ref 12.0–15.0)
Immature Granulocytes: 1 %
Lymphocytes Relative: 16 %
Lymphs Abs: 2.3 K/uL (ref 0.7–4.0)
MCH: 27.3 pg (ref 26.0–34.0)
MCHC: 30.7 g/dL (ref 30.0–36.0)
MCV: 88.8 fL (ref 80.0–100.0)
Monocytes Absolute: 1 K/uL (ref 0.1–1.0)
Monocytes Relative: 7 %
Neutro Abs: 10.8 K/uL — ABNORMAL HIGH (ref 1.7–7.7)
Neutrophils Relative %: 76 %
Platelets: 398 K/uL (ref 150–400)
RBC: 5.46 MIL/uL — ABNORMAL HIGH (ref 3.87–5.11)
RDW: 13.1 % (ref 11.5–15.5)
WBC: 14.3 K/uL — ABNORMAL HIGH (ref 4.0–10.5)
nRBC: 0.6 % — ABNORMAL HIGH (ref 0.0–0.2)

## 2024-03-27 LAB — COMPREHENSIVE METABOLIC PANEL WITH GFR
ALT: 13 U/L (ref 0–44)
AST: 30 U/L (ref 15–41)
Albumin: 2.6 g/dL — ABNORMAL LOW (ref 3.5–5.0)
Alkaline Phosphatase: 85 U/L (ref 38–126)
Anion gap: 22 — ABNORMAL HIGH (ref 5–15)
BUN: 43 mg/dL — ABNORMAL HIGH (ref 8–23)
CO2: 15 mmol/L — ABNORMAL LOW (ref 22–32)
Calcium: 9.4 mg/dL (ref 8.9–10.3)
Chloride: 92 mmol/L — ABNORMAL LOW (ref 98–111)
Creatinine, Ser: 1.57 mg/dL — ABNORMAL HIGH (ref 0.44–1.00)
GFR, Estimated: 31 mL/min — ABNORMAL LOW (ref >60)
Glucose, Bld: 317 mg/dL — ABNORMAL HIGH (ref 70–99)
Potassium: 5.6 mmol/L — ABNORMAL HIGH (ref 3.5–5.1)
Sodium: 129 mmol/L — ABNORMAL LOW (ref 135–145)
Total Bilirubin: 0.7 mg/dL (ref 0.0–1.2)
Total Protein: 7.3 g/dL (ref 6.5–8.1)

## 2024-03-27 LAB — I-STAT VENOUS BLOOD GAS, ED
Acid-base deficit: 4 mmol/L — ABNORMAL HIGH (ref 0.0–2.0)
Bicarbonate: 18.5 mmol/L — ABNORMAL LOW (ref 20.0–28.0)
Calcium, Ion: 0.99 mmol/L — ABNORMAL LOW (ref 1.15–1.40)
HCT: 47 % — ABNORMAL HIGH (ref 36.0–46.0)
Hemoglobin: 16 g/dL — ABNORMAL HIGH (ref 12.0–15.0)
O2 Saturation: 95 %
Potassium: 5.4 mmol/L — ABNORMAL HIGH (ref 3.5–5.1)
Sodium: 127 mmol/L — ABNORMAL LOW (ref 135–145)
TCO2: 19 mmol/L — ABNORMAL LOW (ref 22–32)
pCO2, Ven: 28.4 mmHg — ABNORMAL LOW (ref 44–60)
pH, Ven: 7.422 (ref 7.25–7.43)
pO2, Ven: 71 mmHg — ABNORMAL HIGH (ref 32–45)

## 2024-03-27 LAB — ETHANOL: Alcohol, Ethyl (B): <15 mg/dL (ref <15)

## 2024-03-27 LAB — I-STAT CHEM 8, ED
BUN: 45 mg/dL — ABNORMAL HIGH (ref 8–23)
Calcium, Ion: 0.99 mmol/L — ABNORMAL LOW (ref 1.15–1.40)
Chloride: 99 mmol/L (ref 98–111)
Creatinine, Ser: 1.4 mg/dL — ABNORMAL HIGH (ref 0.44–1.00)
Glucose, Bld: 328 mg/dL — ABNORMAL HIGH (ref 70–99)
HCT: 49 % — ABNORMAL HIGH (ref 36.0–46.0)
Hemoglobin: 16.7 g/dL — ABNORMAL HIGH (ref 12.0–15.0)
Potassium: 5.4 mmol/L — ABNORMAL HIGH (ref 3.5–5.1)
Sodium: 128 mmol/L — ABNORMAL LOW (ref 135–145)
TCO2: 17 mmol/L — ABNORMAL LOW (ref 22–32)

## 2024-03-27 LAB — I-STAT CG4 LACTIC ACID, ED
Lactic Acid, Venous: 6.9 mmol/L (ref 0.5–1.9)
Lactic Acid, Venous: 3.8 mmol/L (ref 0.5–1.9)

## 2024-03-27 LAB — TSH: TSH: 5.567 u[IU]/mL — ABNORMAL HIGH (ref 0.350–4.500)

## 2024-03-27 LAB — TYPE AND SCREEN
ABO/RH(D): B NEG
Antibody Screen: NEGATIVE

## 2024-03-27 LAB — AMMONIA: Ammonia: 51 umol/L — ABNORMAL HIGH (ref 9–35)

## 2024-03-27 MED ORDER — VANCOMYCIN HCL IN DEXTROSE 1-5 GM/200ML-% IV SOLN
1000.0000 mg | Freq: Once | INTRAVENOUS | Status: AC
  Administered 2024-03-27: 1000 mg via INTRAVENOUS
  Filled 2024-03-27: qty 200

## 2024-03-27 MED ORDER — DILTIAZEM HCL-DEXTROSE 125-5 MG/125ML-% IV SOLN (PREMIX): 5.0000 mg/h | INTRAVENOUS | Status: DC

## 2024-03-27 MED ORDER — ACETAMINOPHEN 650 MG RE SUPP
650.0000 mg | Freq: Once | RECTAL | Status: AC
  Administered 2024-03-27: 650 mg via RECTAL
  Filled 2024-03-27: qty 1

## 2024-03-27 MED ORDER — SODIUM CHLORIDE 0.9 % IV BOLUS
1000.0000 mL | Freq: Once | INTRAVENOUS | Status: AC
  Administered 2024-03-27: 1000 mL via INTRAVENOUS

## 2024-03-27 MED ORDER — LACTATED RINGERS IV BOLUS
30.0000 mL/kg | Freq: Once | INTRAVENOUS | Status: AC
  Administered 2024-03-27: 2280 mL via INTRAVENOUS

## 2024-03-27 MED ORDER — METRONIDAZOLE 500 MG/100ML IV SOLN
500.0000 mg | Freq: Once | INTRAVENOUS | Status: AC
  Administered 2024-03-27: 500 mg via INTRAVENOUS
  Filled 2024-03-27: qty 100

## 2024-03-27 MED ORDER — LACTATED RINGERS IV SOLN
INTRAVENOUS | Status: DC
Start: 2024-03-27 — End: 2024-03-28

## 2024-03-27 MED ORDER — SODIUM CHLORIDE 0.9 % IV SOLN
2.0000 g | Freq: Once | INTRAVENOUS | Status: AC
  Administered 2024-03-27: 2 g via INTRAVENOUS
  Filled 2024-03-27: qty 12.5

## 2024-03-27 MED ORDER — IOHEXOL 350 MG/ML SOLN
75.0000 mL | Freq: Once | INTRAVENOUS | Status: AC | PRN
  Administered 2024-03-27: 75 mL via INTRAVENOUS

## 2024-03-27 NOTE — ED Notes (Signed)
Unable to obtain blood cultures at this time.

## 2024-03-27 NOTE — ED Triage Notes (Signed)
 Patient BIB [facility] from home as code sepsis. EMS reports coffee ground emesis today. Patient normal GCS 15 and now GCS 10. EMS reports no BM x5 days. Initial BP 74/40 ->50/30, then after lifting legs up systolic BP 137 palpated. 80% on room air, no o2 at baseline, 4LNC w/ EMS.

## 2024-03-27 NOTE — ED Provider Notes (Signed)
 [place] EMERGENCY DEPARTMENT AT [facility] Provider Note  CSN: [id number] Arrival date & time: 03/27/24 2000  Chief Complaint(s) Code Sepsis  HPI Toni [name] is a 89 y.o. female history of CHF, diabetes, hypertension, obesity presenting to the emergency department with confusion.  Apparently, patient was increasingly weak today, developed nausea and vomiting, paramedics were called.  Paramedics found patient to be hypotensive to the 70s.  Patient also confused and unable to provide any history.  She had additional vomiting with paramedics.  Apparently patient has had no bowel movement for 5 days and has had some abdominal distention and pain.  History otherwise limited due to altered mental status.   Past Medical History Past Medical History:  Diagnosis Date   Diabetes mellitus without complication (HCC)    Hypertension    Peripheral vascular disease (HCC)    Plantar fascial fibromatosis    Patient Active Problem List   Diagnosis Date Noted   Acute on chronic diastolic CHF (congestive heart failure) (HCC) 03/29/2022   Type 2 diabetes mellitus with hyperglycemia, with long-term current use of insulin  (HCC) 03/29/2022   Pyuria 03/29/2022   Bacteriuria 03/29/2022   Essential hypertension 03/29/2022   Prolonged QT interval 03/29/2022   PVD (peripheral vascular disease) (HCC) 03/29/2022   Obesity, Class III, BMI 40-49.9 (morbid obesity) 03/29/2022   Pulmonary hypertension (HCC) 03/29/2022   Hyponatremia 03/29/2022   Normocytic anemia 03/29/2022   Stress fracture, left femur, initial encounter for fracture 03/24/2022   Femur fracture (HCC) 03/24/2022   Sepsis (HCC)    Goals of care, counseling/discussion    Palliative care by specialist    Streptococcal sepsis (HCC) 09/02/2016   Streptococcal bacteremia 09/02/2016   E. coli UTI 09/02/2016   Acute metabolic encephalopathy 09/02/2016   Leukocytosis 09/02/2016   Community acquired pneumonia 08/31/2016   Home  Medication(s) Prior to Admission medications   Medication Sig Start Date End Date Taking? Authorizing Provider  acetaminophen  (TYLENOL ) 500 MG tablet Take 500 mg by mouth daily.    [provider]  albuterol  (PROVENTIL ) (2.5 MG/3ML) 0.083% nebulizer solution Take 3 mLs (2.5 mg total) by nebulization every 4 (four) hours as needed for wheezing or shortness of breath. 09/09/16   Levern Beckey HERO, MD  aspirin  81 MG chewable tablet Chew 81 mg by mouth every evening.    [provider]  bisacodyl  (DULCOLAX) 5 MG EC tablet Take 1 tablet (5 mg total) by mouth daily as needed for moderate constipation. 03/29/22   Sherrill Cable Latif, DO  carvedilol  (COREG ) 3.125 MG tablet Take 1 tablet (3.125 mg total) by mouth 2 (two) times daily with a meal. 03/29/22   Sheikh, Omair Latif, DO  cholecalciferol  (CHOLECALCIFEROL ) 25 MCG tablet Take 2 tablets (2,000 Units total) by mouth 2 (two) times daily. 03/29/22   Sheikh, Omair Latif, DO  feeding supplement (ENSURE ENLIVE / ENSURE PLUS) LIQD Take 237 mLs by mouth 2 (two) times daily between meals. Patient not taking: Reported on 05/24/2022 03/29/22   Sherrill Cable Latif, DO  furosemide  (LASIX ) 40 MG tablet Take 40 mg by mouth daily.    [provider]  HYDROcodone -acetaminophen  (NORCO) 5-325 MG tablet Take 1-2 tablets by mouth every 6 (six) hours as needed for severe pain. Patient not taking: Reported on 05/24/2022 03/28/22   McBane, Caroline N, PA-C  Menthol, Topical Analgesic, (BIOFREEZE) 4 % GEL Apply 1 Application topically 2 (two) times daily.    [provider]  Multiple Vitamins-Minerals (CENTRUM SILVER PO) Take 1 tablet  by mouth daily.     [provider]  Nutritional Supplements (NUTRITIONAL SUPPLEMENT PO) Take 237 mLs by mouth 2 (two) times daily.    [provider]  ondansetron  (ZOFRAN ) 4 MG tablet Take 1 tablet (4 mg total) by mouth every 6 (six) hours as needed for nausea. 03/29/22   Sheikh, Omair Latif, DO   Polyethyl Glycol-Propyl Glycol (SYSTANE ULTRA) 0.4-0.3 % SOLN Place 2 drops into both eyes 2 (two) times daily as needed (for dry eyes).    [provider]  Potassium Chloride  ER 20 MEQ TBCR Take 20 mEq by mouth daily.    [provider]  senna-docusate (SENOKOT-S) 8.6-50 MG tablet Take 1 tablet by mouth at bedtime as needed for mild constipation or moderate constipation. 05/24/22   Long, Fonda MATSU, MD  simvastatin  (ZOCOR ) 40 MG tablet Take 40 mg by mouth daily.     [provider]  Vitamin D , Ergocalciferol , (DRISDOL ) 1.25 MG (50000 UNIT) CAPS capsule Take 1 capsule (50,000 Units total) by mouth every 7 (seven) days. Patient not taking: Reported on 05/24/2022 04/02/22   Sheikh, Omair Latif, DO  XARELTO  10 MG TABS tablet Take 10 mg by mouth daily. 04/27/22   [provider]                                                                                                                                    Past Surgical History Past Surgical History:  Procedure Laterality Date   ABDOMINAL HYSTERECTOMY     partial   ECTOPIC PREGNANCY SURGERY     JOINT REPLACEMENT Bilateral    TKR   ORIF FEMUR FRACTURE Left 03/25/2022   Procedure: OPEN REDUCTION INTERNAL FIXATION LEFT FEMUR;  Surgeon: Celena Sharper, MD;  Location: [facility] OR;  Service: Orthopedics;  Laterality: Left;   TEE WITHOUT CARDIOVERSION N/A 09/07/2016   Procedure: TRANSESOPHAGEAL ECHOCARDIOGRAM (TEE);  Surgeon: Annabella Scarce, MD;  Location: [facility] ENDOSCOPY;  Service: Cardiovascular;  Laterality: N/A;   Family History Family History  Problem Relation Age of Onset   Hypertension Mother    Hyperlipidemia Mother        before age 2   Heart disease Father    Hypertension Father    Hyperlipidemia Father        before age 81   Diabetes Sister    Other Sister        amputation    Social History Social History   Tobacco Use   Smoking status: Never   Smokeless tobacco: Never  Substance Use Topics    Alcohol use: No   Drug use: No   Allergies Patient has no known allergies.  Review of Systems Review of Systems  All other systems reviewed and are negative.   Physical Exam Vital Signs  I have reviewed the triage vital signs BP 95/69   Pulse (!) 134   Temp (!) 102.4 F (39.1 C) (Rectal)  Resp (!) 28   SpO2 94%  Physical Exam Vitals and nursing note reviewed.  Constitutional:      General: She is in acute distress.     Appearance: She is well-developed. She is ill-appearing.  HENT:     Head: Normocephalic and atraumatic.     Mouth/Throat:     Mouth: Mucous membranes are dry.  Eyes:     Pupils: Pupils are equal, round, and reactive to light.  Cardiovascular:     Rate and Rhythm: Normal rate and regular rhythm.     Heart sounds: No murmur heard. Pulmonary:     Effort: Pulmonary effort is normal. No respiratory distress.     Breath sounds: Normal breath sounds.  Abdominal:     General: Abdomen is flat. There is distension.     Palpations: Abdomen is soft.     Tenderness: There is no abdominal tenderness.  Musculoskeletal:        General: No tenderness.     Right lower leg: No edema.     Left lower leg: No edema.  Skin:    General: Skin is warm and dry.  Neurological:     Mental Status: She is alert.     Comments: Alert, but not following commands, no spontaneous speech.  Moves all 4 extremities.  Localizes pain.  Psychiatric:        Mood and Affect: Mood normal.        Behavior: Behavior normal.     ED Results and Treatments Labs (all labs ordered are listed, but only abnormal results are displayed) Labs Reviewed  COMPREHENSIVE METABOLIC PANEL WITH GFR - Abnormal; Notable for the following components:      Result Value   Sodium 129 (*)    Potassium 5.6 (*)    Chloride 92 (*)    CO2 15 (*)    Glucose, Bld 317 (*)    BUN 43 (*)    Creatinine, Ser 1.57 (*)    Albumin  2.6 (*)    GFR, Estimated 31 (*)    Anion gap 22 (*)    All other components within  normal limits  CBC WITH DIFFERENTIAL/PLATELET - Abnormal; Notable for the following components:   WBC 14.3 (*)    RBC 5.46 (*)    HCT 48.5 (*)    nRBC 0.6 (*)    Neutro Abs 10.8 (*)    Abs Immature Granulocytes 0.15 (*)    All other components within normal limits  TSH - Abnormal; Notable for the following components:   TSH 5.567 (*)    All other components within normal limits  AMMONIA - Abnormal; Notable for the following components:   Ammonia 51 (*)    All other components within normal limits  I-STAT CHEM 8, ED - Abnormal; Notable for the following components:   Sodium 128 (*)    Potassium 5.4 (*)    BUN 45 (*)    Creatinine, Ser 1.40 (*)    Glucose, Bld 328 (*)    Calcium, Ion 0.99 (*)    TCO2 17 (*)    Hemoglobin 16.7 (*)    HCT 49.0 (*)    All other components within normal limits  I-STAT CG4 LACTIC ACID, ED - Abnormal; Notable for the following components:   Lactic Acid, Venous 6.9 (*)    All other components within normal limits  I-STAT VENOUS BLOOD GAS, ED - Abnormal; Notable for the following components:   pCO2, Ven 28.4 (*)    pO2, Ven  71 (*)    Bicarbonate 18.5 (*)    TCO2 19 (*)    Acid-base deficit 4.0 (*)    Sodium 127 (*)    Potassium 5.4 (*)    Calcium, Ion 0.99 (*)    HCT 47.0 (*)    Hemoglobin 16.0 (*)    All other components within normal limits  I-STAT CG4 LACTIC ACID, ED - Abnormal; Notable for the following components:   Lactic Acid, Venous 3.8 (*)    All other components within normal limits  CULTURE, BLOOD (ROUTINE X 2)  CULTURE, BLOOD (ROUTINE X 2)  URINE CULTURE  ETHANOL  LIPASE, BLOOD  LACTIC ACID, PLASMA  CBG MONITORING, ED  TYPE AND SCREEN                                                                                                                          Radiology CT ABDOMEN PELVIS W CONTRAST Result Date: 03/27/2024 EXAM: CT ABDOMEN AND PELVIS WITH CONTRAST 03/27/2024 10:37:00 PM TECHNIQUE: CT of the abdomen and pelvis was  performed with the administration of intravenous contrast. Multiplanar reformatted images are provided for review. Automated exposure control, iterative reconstruction, and/or weight-based adjustment of the mA/kV was utilized to reduce the radiation dose to as low as reasonably achievable. COMPARISON: CT 06/11/2022 CLINICAL HISTORY: Abdominal pain, acute, nonlocalized. FINDINGS: LOWER CHEST: No acute abnormality. LIVER: Stable hepatic cysts. Cirrhosis. Cavernous transformation of the portal vein. GALLBLADDER AND BILE DUCTS: Gallbladder is unremarkable. No biliary ductal dilatation. SPLEEN: No acute abnormality. PANCREAS: No acute abnormality. ADRENAL GLANDS: No acute abnormality. KIDNEYS, URETERS AND BLADDER: Staghorn calculus in the right kidney extending into the proximal right ureter. Mild right hydronephrosis and dilation of the proximal right ureter. This is new compared to the most recent comparison of 06/11/2022. Linear calcification along the posterior left bladder and layering posteriorly in the large left bladder diverticulum. GI AND BOWEL: Marked wall thickening of the distal esophagus. Large stool ball in the rectum with mild rectal wall thickening and trace adjacent stranding. The finding is concerning for impaction and stercoral colitis. Diverticulosis without evidence of diverticulitis. The sigmoid colon herniates into the left inguinal hernia without evidence of obstruction. PERITONEUM AND RETROPERITONEUM: No ascites. No free air. VASCULATURE: Aorta is normal in caliber. LYMPH NODES: No lymphadenopathy. REPRODUCTIVE ORGANS: No acute abnormality. BONES AND SOFT TISSUES: Postoperative changes of the left femur. Advanced arthritis left hip. Demineralization. Small fat and fluid-containing right inguinal hernia. IMPRESSION: 1. Large stool ball in the rectum with mild rectal wall thickening and trace adjacent stranding, concerning for impaction and stercoral colitis. The colon is dilated upstream from the  rectal stool ball. 2. Staghorn calculus in the right kidney extending into the proximal right ureter with mild right hydronephrosis and dilation of the proximal right ureter, new compared to 06/11/2022. 3. Marked wall thickening of the distal esophagus, esophagitis. Infiltrative mass not excluded. 4. Sigmoid colon herniates into the left inguinal hernia without evidence of obstruction. 5. Cirrhosis with  cavernous transformation of the portal vein. Electronically signed by: Norman Gatlin MD 03/27/2024 10:52 PM EDT RP Workstation: [id number]   CT Head Wo Contrast Result Date: 03/27/2024 CLINICAL DATA:  Mental status change, unknown cause EXAM: CT HEAD WITHOUT CONTRAST TECHNIQUE: Contiguous axial images were obtained from the base of the skull through the vertex without intravenous contrast. RADIATION DOSE REDUCTION: This exam was performed according to the departmental dose-optimization program which includes automated exposure control, adjustment of the mA and/or kV according to patient size and/or use of iterative reconstruction technique. COMPARISON:  08/31/2016 FINDINGS: Brain: No acute intracranial abnormality. Specifically, no hemorrhage, hydrocephalus, mass lesion, acute infarction, or significant intracranial injury. Mild age related atrophy. Vascular: No hyperdense vessel or unexpected calcification. Skull: No acute calvarial abnormality. Sinuses/Orbits: No acute findings Other: None IMPRESSION: No acute intracranial abnormality. Electronically Signed   By: Franky Crease M.D.   On: 03/27/2024 22:46   DG Chest Portable 1 View Result Date: 03/27/2024 CLINICAL DATA:  Shortness of breath EXAM: PORTABLE CHEST 1 VIEW COMPARISON:  03/28/2022 FINDINGS: Hypoventilatory changes. Subsegmental atelectasis at the right base. Mitral annular calcification. Stable cardiomediastinal silhouette. No pleural effusion or pneumothorax IMPRESSION: Hypoventilatory changes with subsegmental atelectasis at the right base.  Electronically Signed   By: Luke Bun M.D.   On: 03/27/2024 20:40    Pertinent labs & imaging results that were available during my care of the patient were reviewed by me and considered in my medical decision making (see MDM for details).  Medications Ordered in ED Medications  lactated ringers  infusion ( Intravenous New Bag/Given 03/27/24 2252)  vancomycin  (VANCOCIN ) IVPB 1000 mg/200 mL premix (1,000 mg Intravenous New Bag/Given 03/27/24 2253)  lactated ringers  bolus 2,280 mL (0 mLs Intravenous Stopped 03/27/24 2247)  ceFEPIme  (MAXIPIME ) 2 g in sodium chloride  0.9 % 100 mL IVPB (0 g Intravenous Stopped 03/27/24 2151)  metroNIDAZOLE  (FLAGYL ) IVPB 500 mg (0 mg Intravenous Stopped 03/27/24 2255)  acetaminophen  (TYLENOL ) suppository 650 mg (650 mg Rectal Given 03/27/24 2218)  iohexol  (OMNIPAQUE ) 350 MG/ML injection 75 mL (75 mLs Intravenous Contrast Given 03/27/24 2234)  sodium chloride  0.9 % bolus 1,000 mL (1,000 mLs Intravenous New Bag/Given 03/27/24 2322)                                                                                                                                     Procedures .Critical Care  Performed by: Francesca Elsie CROME, MD Authorized by: Francesca Elsie CROME, MD   Critical care provider statement:    Critical care time (minutes):  45   Critical care was necessary to treat or prevent imminent or life-threatening deterioration of the following conditions:  Sepsis   Critical care was time spent personally by me on the following activities:  Development of treatment plan with patient or surrogate, discussions with consultants, evaluation of patient's response to treatment, examination of patient, ordering and review of laboratory studies, ordering and review of  radiographic studies, ordering and performing treatments and interventions, pulse oximetry, re-evaluation of patient's condition and review of old charts .Fecal disimpaction  Date/Time: 03/27/2024 11:43 PM  Performed by:  Francesca Elsie CROME, MD Authorized by: Francesca Elsie CROME, MD  Consent: Verbal consent obtained Risks and benefits: risks, benefits and alternatives were discussed Consent given by: patient Patient identity confirmed: verbally with patient and hospital-assigned identification number Local anesthesia used: no  Anesthesia: Local anesthesia used: no  Sedation: Patient sedated: no  Patient tolerance: patient tolerated the procedure well with no immediate complications Comments: Large amount of stool evacuated      (including critical care time)  Medical Decision Making / ED Course   MDM:  89 year old presenting to the emergency department with altered mental status, vomiting.  Patient somewhat ill-appearing, vitals notable for hypotension, tachycardia.  Exam otherwise with some possible abdominal distention although no focal tenderness appreciated.  Patient also with altered mental status though no obvious deficit  Suspect possible underlying toxic metabolic, infectious process driving altered mental status.  Will check labs.  Given vital signs, leukocytosis, lactic acidosis activated code sepsis, will give 30 cc/kg fluid bolus and give IV antibiotics.  Will obtain blood cultures.  Will obtain chest x-ray, urinalysis.  Will obtain CT abdomen given possible distention and vomiting to evaluate for underlying infection.  Will reassess.  Given confusion will obtain CT head to evaluate for any acute intracranial process.  Clinical Course as of 03/27/24 2344  Tue Mar 27, 2024  2151 Patient urinalysis was obtained by In-N-Out cath per nursing.  Was sent to the lab but apparently urinalysis was too thick and looked like honey mustard and the lab was not able to perform a urinalysis.  Will obtain urine culture from urinalysis however.  Given this, some concern for urinary cause [WS]  2340 CT scan shows stool impaction, some large bowel dilation.  Disimpaction was performed, large amount of  stool disimpacted.  Patient mental status also much better, she is able to answer questions and says she is feeling better.  On repeat check, EKG does seem to actually show more of atrial fibrillation.  Discussed with Dr. Laurence with hospitalist who will come and see patient.  CT scan also has new staghorn calculus with some hydronephrosis, suspect this is probably nidus of infection.  Given lactic acidosis and large bowel dilation, will discuss with surgery but doubt need for any acute surgical intervention.  We also discussed staghorn calculus and hydronephrosis with urology whether patient needs any nephrostomy tube for this.  Signed out to Dr. Raford pending admission and specialist consultation [WS]    Clinical Course User Index [WS] Francesca Elsie CROME, MD     Additional history obtained: -Additional history obtained from family and ems -External records from outside source obtained and reviewed including: Chart review including previous notes, labs, imaging, consultation notes including prior notes    Lab Tests: -I ordered, reviewed, and interpreted labs.   The pertinent results include:   Labs Reviewed  COMPREHENSIVE METABOLIC PANEL WITH GFR - Abnormal; Notable for the following components:      Result Value   Sodium 129 (*)    Potassium 5.6 (*)    Chloride 92 (*)    CO2 15 (*)    Glucose, Bld 317 (*)    BUN 43 (*)    Creatinine, Ser 1.57 (*)    Albumin  2.6 (*)    GFR, Estimated 31 (*)    Anion gap 22 (*)  All other components within normal limits  CBC WITH DIFFERENTIAL/PLATELET - Abnormal; Notable for the following components:   WBC 14.3 (*)    RBC 5.46 (*)    HCT 48.5 (*)    nRBC 0.6 (*)    Neutro Abs 10.8 (*)    Abs Immature Granulocytes 0.15 (*)    All other components within normal limits  TSH - Abnormal; Notable for the following components:   TSH 5.567 (*)    All other components within normal limits  AMMONIA - Abnormal; Notable for the following components:    Ammonia 51 (*)    All other components within normal limits  I-STAT CHEM 8, ED - Abnormal; Notable for the following components:   Sodium 128 (*)    Potassium 5.4 (*)    BUN 45 (*)    Creatinine, Ser 1.40 (*)    Glucose, Bld 328 (*)    Calcium, Ion 0.99 (*)    TCO2 17 (*)    Hemoglobin 16.7 (*)    HCT 49.0 (*)    All other components within normal limits  I-STAT CG4 LACTIC ACID, ED - Abnormal; Notable for the following components:   Lactic Acid, Venous 6.9 (*)    All other components within normal limits  I-STAT VENOUS BLOOD GAS, ED - Abnormal; Notable for the following components:   pCO2, Ven 28.4 (*)    pO2, Ven 71 (*)    Bicarbonate 18.5 (*)    TCO2 19 (*)    Acid-base deficit 4.0 (*)    Sodium 127 (*)    Potassium 5.4 (*)    Calcium, Ion 0.99 (*)    HCT 47.0 (*)    Hemoglobin 16.0 (*)    All other components within normal limits  I-STAT CG4 LACTIC ACID, ED - Abnormal; Notable for the following components:   Lactic Acid, Venous 3.8 (*)    All other components within normal limits  CULTURE, BLOOD (ROUTINE X 2)  CULTURE, BLOOD (ROUTINE X 2)  URINE CULTURE  ETHANOL  LIPASE, BLOOD  LACTIC ACID, PLASMA  CBG MONITORING, ED  TYPE AND SCREEN    Notable for lactic acidosis, AKI, leukocytosis  EKG   EKG Interpretation Date/Time:  Tuesday March 27 2024 23:23:52 EDT Ventricular Rate:  133 PR Interval:    QRS Duration:  91 QT Interval:  298 QTC Calculation: 444 R Axis:   221  Text Interpretation: Atrial fibrillation Markedly posterior QRS axis Nonspecific T abnormalities, lateral leads Confirmed by Francesca Fallow ([id number]) on 03/27/2024 11:28:55 PM         Imaging Studies ordered: I ordered imaging studies including CXR, CT head, CT abdomen On my interpretation imaging demonstrates staghorn calculi, fecal impaction I independently visualized and interpreted imaging. I agree with the radiologist interpretation   Medicines ordered and prescription drug  management: Meds ordered this encounter  Medications   lactated ringers  bolus 2,280 mL   lactated ringers  infusion   ceFEPIme  (MAXIPIME ) 2 g in sodium chloride  0.9 % 100 mL IVPB    Antibiotic Indication::   Other Indication (list below)    Other Indication::   Unknown Source.   metroNIDAZOLE  (FLAGYL ) IVPB 500 mg    Antibiotic Indication::   Other Indication (list below)    Other Indication::   Unknown Source.   vancomycin  (VANCOCIN ) IVPB 1000 mg/200 mL premix    Indication::   Other Indication (list below)    Other Indication::   Unknown Source.   acetaminophen  (TYLENOL ) suppository 650 mg  iohexol  (OMNIPAQUE ) 350 MG/ML injection 75 mL   sodium chloride  0.9 % bolus 1,000 mL   DISCONTD: diltiazem  (CARDIZEM ) 125 mg in dextrose  5% 125 mL (1 mg/mL) infusion    -I have reviewed the patients home medicines and have made adjustments as needed   Cardiac Monitoring: The patient was maintained on a cardiac monitor.  I personally viewed and interpreted the cardiac monitored which showed an underlying rhythm of: atrial fibrillation  Social Determinants of Health:  Diagnosis or treatment significantly limited by social determinants of health: obesity   Reevaluation: After the interventions noted above, I reevaluated the patient and found that their symptoms have improved  Co morbidities that complicate the patient evaluation  Past Medical History:  Diagnosis Date   Diabetes mellitus without complication (HCC)    Hypertension    Peripheral vascular disease (HCC)    Plantar fascial fibromatosis       Dispostion: Disposition decision including need for hospitalization was considered, and patient admitted to the hospital.    Final Clinical Impression(s) / ED Diagnoses Final diagnoses:  Sepsis secondary to UTI (HCC)  Fecal impaction (HCC)  Lactic acidosis  Acute metabolic encephalopathy  Atrial fibrillation, unspecified type (HCC)     This chart was dictated using voice  recognition software.  Despite best efforts to proofread,  errors can occur which can change the documentation meaning.    Francesca Elsie CROME, MD 03/27/24 [phone]

## 2024-03-27 NOTE — Sepsis Progress Note (Signed)
 Elink monitoring for the code sepsis protocol.

## 2024-03-28 ENCOUNTER — Inpatient Hospital Stay (HOSPITAL_COMMUNITY)

## 2024-03-28 ENCOUNTER — Encounter (HOSPITAL_COMMUNITY): Payer: Self-pay | Admitting: Critical Care Medicine

## 2024-03-28 DIAGNOSIS — D649 Anemia, unspecified: Secondary | ICD-10-CM | POA: Diagnosis present

## 2024-03-28 DIAGNOSIS — E66812 Obesity, class 2: Secondary | ICD-10-CM | POA: Diagnosis present

## 2024-03-28 DIAGNOSIS — K7682 Hepatic encephalopathy: Secondary | ICD-10-CM | POA: Diagnosis present

## 2024-03-28 DIAGNOSIS — A4159 Other Gram-negative sepsis: Secondary | ICD-10-CM | POA: Diagnosis present

## 2024-03-28 DIAGNOSIS — N179 Acute kidney failure, unspecified: Secondary | ICD-10-CM

## 2024-03-28 DIAGNOSIS — E440 Moderate protein-calorie malnutrition: Secondary | ICD-10-CM | POA: Insufficient documentation

## 2024-03-28 DIAGNOSIS — I5032 Chronic diastolic (congestive) heart failure: Secondary | ICD-10-CM

## 2024-03-28 DIAGNOSIS — Z1611 Resistance to penicillins: Secondary | ICD-10-CM | POA: Diagnosis present

## 2024-03-28 DIAGNOSIS — Z1629 Resistance to other single specified antibiotic: Secondary | ICD-10-CM | POA: Diagnosis present

## 2024-03-28 DIAGNOSIS — A419 Sepsis, unspecified organism: Secondary | ICD-10-CM | POA: Diagnosis not present

## 2024-03-28 DIAGNOSIS — I110 Hypertensive heart disease with heart failure: Secondary | ICD-10-CM | POA: Diagnosis present

## 2024-03-28 DIAGNOSIS — R4182 Altered mental status, unspecified: Secondary | ICD-10-CM | POA: Diagnosis present

## 2024-03-28 DIAGNOSIS — K7460 Unspecified cirrhosis of liver: Secondary | ICD-10-CM | POA: Diagnosis present

## 2024-03-28 DIAGNOSIS — K5641 Fecal impaction: Secondary | ICD-10-CM | POA: Diagnosis present

## 2024-03-28 DIAGNOSIS — R578 Other shock: Secondary | ICD-10-CM

## 2024-03-28 DIAGNOSIS — N390 Urinary tract infection, site not specified: Secondary | ICD-10-CM | POA: Diagnosis not present

## 2024-03-28 DIAGNOSIS — I4891 Unspecified atrial fibrillation: Secondary | ICD-10-CM | POA: Diagnosis not present

## 2024-03-28 DIAGNOSIS — J9602 Acute respiratory failure with hypercapnia: Secondary | ICD-10-CM | POA: Diagnosis present

## 2024-03-28 DIAGNOSIS — E1151 Type 2 diabetes mellitus with diabetic peripheral angiopathy without gangrene: Secondary | ICD-10-CM | POA: Diagnosis present

## 2024-03-28 DIAGNOSIS — Z7189 Other specified counseling: Secondary | ICD-10-CM | POA: Diagnosis not present

## 2024-03-28 DIAGNOSIS — I480 Paroxysmal atrial fibrillation: Secondary | ICD-10-CM | POA: Diagnosis present

## 2024-03-28 DIAGNOSIS — A4151 Sepsis due to Escherichia coli [E. coli]: Secondary | ICD-10-CM | POA: Diagnosis present

## 2024-03-28 DIAGNOSIS — N136 Pyonephrosis: Secondary | ICD-10-CM | POA: Diagnosis present

## 2024-03-28 DIAGNOSIS — G9341 Metabolic encephalopathy: Secondary | ICD-10-CM | POA: Diagnosis present

## 2024-03-28 DIAGNOSIS — J9601 Acute respiratory failure with hypoxia: Secondary | ICD-10-CM | POA: Diagnosis not present

## 2024-03-28 DIAGNOSIS — E1165 Type 2 diabetes mellitus with hyperglycemia: Secondary | ICD-10-CM | POA: Diagnosis present

## 2024-03-28 DIAGNOSIS — E11649 Type 2 diabetes mellitus with hypoglycemia without coma: Secondary | ICD-10-CM | POA: Diagnosis not present

## 2024-03-28 DIAGNOSIS — Z66 Do not resuscitate: Secondary | ICD-10-CM | POA: Diagnosis not present

## 2024-03-28 DIAGNOSIS — R6521 Severe sepsis with septic shock: Secondary | ICD-10-CM | POA: Diagnosis present

## 2024-03-28 DIAGNOSIS — E872 Acidosis, unspecified: Secondary | ICD-10-CM | POA: Diagnosis present

## 2024-03-28 DIAGNOSIS — Z515 Encounter for palliative care: Secondary | ICD-10-CM | POA: Diagnosis not present

## 2024-03-28 LAB — URINALYSIS, ROUTINE W REFLEX MICROSCOPIC
Bacteria, UA: NONE SEEN
Bilirubin Urine: NEGATIVE
Glucose, UA: NEGATIVE mg/dL
Ketones, ur: NEGATIVE mg/dL
Nitrite: NEGATIVE
Protein, ur: 30 mg/dL — AB
Specific Gravity, Urine: 1.033 — ABNORMAL HIGH (ref 1.005–1.030)
WBC, UA: >50 WBC/hpf (ref 0–5)
pH: 7 (ref 5.0–8.0)

## 2024-03-28 LAB — CBC
HCT: 36.9 % (ref 36.0–46.0)
Hemoglobin: 11.7 g/dL — ABNORMAL LOW (ref 12.0–15.0)
MCH: 27.7 pg (ref 26.0–34.0)
MCHC: 31.7 g/dL (ref 30.0–36.0)
MCV: 87.2 fL (ref 80.0–100.0)
Platelets: 263 K/uL (ref 150–400)
RBC: 4.23 MIL/uL (ref 3.87–5.11)
RDW: 13.1 % (ref 11.5–15.5)
WBC: 11.7 K/uL — ABNORMAL HIGH (ref 4.0–10.5)
nRBC: 0.3 % — ABNORMAL HIGH (ref 0.0–0.2)

## 2024-03-28 LAB — BASIC METABOLIC PANEL WITH GFR
Anion gap: 12 (ref 5–15)
Anion gap: 17 — ABNORMAL HIGH (ref 5–15)
BUN: 40 mg/dL — ABNORMAL HIGH (ref 8–23)
BUN: 42 mg/dL — ABNORMAL HIGH (ref 8–23)
CO2: 21 mmol/L — ABNORMAL LOW (ref 22–32)
CO2: 23 mmol/L (ref 22–32)
Calcium: 8.2 mg/dL — ABNORMAL LOW (ref 8.9–10.3)
Calcium: 8.1 mg/dL — ABNORMAL LOW (ref 8.9–10.3)
Chloride: 99 mmol/L (ref 98–111)
Chloride: 97 mmol/L — ABNORMAL LOW (ref 98–111)
Creatinine, Ser: 1.22 mg/dL — ABNORMAL HIGH (ref 0.44–1.00)
Creatinine, Ser: 1.01 mg/dL — ABNORMAL HIGH (ref 0.44–1.00)
GFR, Estimated: 42 mL/min — ABNORMAL LOW (ref >60)
GFR, Estimated: 53 mL/min — ABNORMAL LOW (ref >60)
Glucose, Bld: 260 mg/dL — ABNORMAL HIGH (ref 70–99)
Glucose, Bld: 223 mg/dL — ABNORMAL HIGH (ref 70–99)
Potassium: 3.9 mmol/L (ref 3.5–5.1)
Potassium: 3.8 mmol/L (ref 3.5–5.1)
Sodium: 132 mmol/L — ABNORMAL LOW (ref 135–145)
Sodium: 137 mmol/L (ref 135–145)

## 2024-03-28 LAB — GLUCOSE, CAPILLARY
Glucose-Capillary: 237 mg/dL — ABNORMAL HIGH (ref 70–99)
Glucose-Capillary: 221 mg/dL — ABNORMAL HIGH (ref 70–99)
Glucose-Capillary: 165 mg/dL — ABNORMAL HIGH (ref 70–99)
Glucose-Capillary: 180 mg/dL — ABNORMAL HIGH (ref 70–99)
Glucose-Capillary: 169 mg/dL — ABNORMAL HIGH (ref 70–99)
Glucose-Capillary: 197 mg/dL — ABNORMAL HIGH (ref 70–99)

## 2024-03-28 LAB — ECHOCARDIOGRAM COMPLETE
Height: 62 in
MV VTI: 1.03 cm2
S' Lateral: 2.5 cm
Weight: 3160.51 [oz_av]

## 2024-03-28 LAB — MRSA NEXT GEN BY PCR, NASAL: MRSA by PCR Next Gen: NOT DETECTED

## 2024-03-28 LAB — HEMOGLOBIN A1C
Hgb A1c MFr Bld: 7.2 % — ABNORMAL HIGH (ref 4.8–5.6)
Mean Plasma Glucose: 159.94 mg/dL

## 2024-03-28 LAB — LACTIC ACID, PLASMA
Lactic Acid, Venous: 3.2 mmol/L (ref 0.5–1.9)
Lactic Acid, Venous: 5.1 mmol/L (ref 0.5–1.9)
Lactic Acid, Venous: 3.3 mmol/L (ref 0.5–1.9)
Lactic Acid, Venous: 4.4 mmol/L (ref 0.5–1.9)

## 2024-03-28 LAB — HEPATIC FUNCTION PANEL
ALT: 14 U/L (ref 0–44)
AST: 32 U/L (ref 15–41)
Albumin: 1.6 g/dL — ABNORMAL LOW (ref 3.5–5.0)
Alkaline Phosphatase: 50 U/L (ref 38–126)
Bilirubin, Direct: 0.2 mg/dL (ref 0.0–0.2)
Indirect Bilirubin: 0.7 mg/dL (ref 0.3–0.9)
Total Bilirubin: 0.9 mg/dL (ref 0.0–1.2)
Total Protein: 4.9 g/dL — ABNORMAL LOW (ref 6.5–8.1)

## 2024-03-28 LAB — TSH: TSH: 2.441 u[IU]/mL (ref 0.350–4.500)

## 2024-03-28 LAB — T4, FREE: Free T4: 1.21 ng/dL — ABNORMAL HIGH (ref 0.61–1.12)

## 2024-03-28 LAB — PHOSPHORUS: Phosphorus: 2.4 mg/dL — ABNORMAL LOW (ref 2.5–4.6)

## 2024-03-28 LAB — MAGNESIUM
Magnesium: 1.5 mg/dL — ABNORMAL LOW (ref 1.7–2.4)
Magnesium: 2.5 mg/dL — ABNORMAL HIGH (ref 1.7–2.4)

## 2024-03-28 LAB — LIPASE, BLOOD: Lipase: <10 U/L — ABNORMAL LOW (ref 11–51)

## 2024-03-28 MED ORDER — ORAL CARE MOUTH RINSE: 15.0000 mL | OROMUCOSAL | Status: DC | PRN

## 2024-03-28 MED ORDER — ALBUMIN HUMAN 25 % IV SOLN
INTRAVENOUS | Status: AC
  Filled 2024-03-28: qty 50

## 2024-03-28 MED ORDER — SODIUM BICARBONATE 8.4 % IV SOLN
100.0000 meq | Freq: Once | INTRAVENOUS | Status: AC
  Administered 2024-03-28: 100 meq via INTRAVENOUS
  Filled 2024-03-28: qty 100

## 2024-03-28 MED ORDER — VANCOMYCIN HCL IN DEXTROSE 1-5 GM/200ML-% IV SOLN: 1000.0000 mg | INTRAVENOUS | Status: DC

## 2024-03-28 MED ORDER — PANTOPRAZOLE SODIUM 40 MG IV SOLR
40.0000 mg | Freq: Two times a day (BID) | INTRAVENOUS | Status: DC
  Administered 2024-03-28 – 2024-03-31 (×8): 40 mg via INTRAVENOUS
  Filled 2024-03-28 (×7): qty 10

## 2024-03-28 MED ORDER — ALBUMIN HUMAN 5 % IV SOLN
25.0000 g | Freq: Three times a day (TID) | INTRAVENOUS | Status: AC
  Administered 2024-03-28 – 2024-03-29 (×3): 25 g via INTRAVENOUS
  Filled 2024-03-28 (×3): qty 500

## 2024-03-28 MED ORDER — VITAL AF 1.2 CAL PO LIQD
1000.0000 mL | ORAL | Status: DC
  Administered 2024-03-28 – 2024-04-05 (×7): 1000 mL
  Filled 2024-03-28 (×6): qty 1000

## 2024-03-28 MED ORDER — LACTATED RINGERS IV BOLUS
500.0000 mL | Freq: Once | INTRAVENOUS | Status: AC
Start: 2024-03-28 — End: 2024-03-28
  Administered 2024-03-28: 500 mL via INTRAVENOUS

## 2024-03-28 MED ORDER — PERFLUTREN LIPID MICROSPHERE
1.0000 mL | INTRAVENOUS | Status: AC | PRN
  Administered 2024-03-28: 5 mL via INTRAVENOUS

## 2024-03-28 MED ORDER — AMIODARONE HCL IN DEXTROSE 360-4.14 MG/200ML-% IV SOLN
30.0000 mg/h | INTRAVENOUS | Status: DC
  Administered 2024-03-28 – 2024-03-29 (×3): 30 mg/h via INTRAVENOUS
  Filled 2024-03-28 (×3): qty 200

## 2024-03-28 MED ORDER — THIAMINE MONONITRATE 100 MG PO TABS
100.0000 mg | ORAL_TABLET | Freq: Every day | ORAL | Status: AC
  Administered 2024-03-28 – 2024-04-03 (×7): 100 mg
  Filled 2024-03-28 (×7): qty 1

## 2024-03-28 MED ORDER — CHLORHEXIDINE GLUCONATE CLOTH 2 % EX PADS
6.0000 | MEDICATED_PAD | Freq: Every day | CUTANEOUS | Status: DC
  Administered 2024-03-28 – 2024-04-16 (×20): 6 via TOPICAL

## 2024-03-28 MED ORDER — AMIODARONE LOAD VIA INFUSION
150.0000 mg | Freq: Once | INTRAVENOUS | Status: AC
  Administered 2024-03-28: 150 mg via INTRAVENOUS
  Filled 2024-03-28: qty 83.34

## 2024-03-28 MED ORDER — MAGNESIUM SULFATE 4 GM/100ML IV SOLN
4.0000 g | Freq: Once | INTRAVENOUS | Status: AC
  Administered 2024-03-28: 4 g via INTRAVENOUS
  Filled 2024-03-28: qty 100

## 2024-03-28 MED ORDER — ORAL CARE MOUTH RINSE
15.0000 mL | OROMUCOSAL | Status: DC
  Administered 2024-03-28 (×4): 15 mL via OROMUCOSAL

## 2024-03-28 MED ORDER — INSULIN ASPART 100 UNIT/ML IJ SOLN
0.0000 [IU] | INTRAMUSCULAR | Status: DC
  Administered 2024-03-28 (×3): 5 [IU] via SUBCUTANEOUS
  Administered 2024-03-28 – 2024-03-29 (×7): 3 [IU] via SUBCUTANEOUS
  Administered 2024-03-29 – 2024-03-31 (×3): 2 [IU] via SUBCUTANEOUS
  Administered 2024-04-01 (×2): 3 [IU] via SUBCUTANEOUS

## 2024-03-28 MED ORDER — POTASSIUM CHLORIDE CRYS ER 20 MEQ PO TBCR
40.0000 meq | EXTENDED_RELEASE_TABLET | Freq: Once | ORAL | Status: AC
  Administered 2024-03-28: 40 meq via ORAL
  Filled 2024-03-28: qty 2

## 2024-03-28 MED ORDER — PIPERACILLIN-TAZOBACTAM 3.375 G IVPB
3.3750 g | Freq: Three times a day (TID) | INTRAVENOUS | Status: DC
  Administered 2024-03-28 – 2024-04-01 (×13): 3.375 g via INTRAVENOUS
  Filled 2024-03-28 (×13): qty 50

## 2024-03-28 MED ORDER — HEPARIN SODIUM (PORCINE) 5000 UNIT/ML IJ SOLN
5000.0000 [IU] | Freq: Three times a day (TID) | INTRAMUSCULAR | Status: DC
Start: 2024-03-28 — End: 2024-04-17
  Administered 2024-03-28 – 2024-04-17 (×61): 5000 [IU] via SUBCUTANEOUS
  Filled 2024-03-28 (×60): qty 1

## 2024-03-28 MED ORDER — STERILE WATER FOR INJECTION IV SOLN
INTRAVENOUS | Status: DC
  Filled 2024-03-28: qty 1000

## 2024-03-28 MED ORDER — GERHARDT'S BUTT CREAM
TOPICAL_CREAM | CUTANEOUS | Status: DC | PRN
  Filled 2024-03-28 (×2): qty 60

## 2024-03-28 MED ORDER — POLYETHYLENE GLYCOL 3350 17 G PO PACK: 17.0000 g | PACK | Freq: Every day | ORAL | Status: DC | PRN

## 2024-03-28 MED ORDER — MIDODRINE HCL 5 MG PO TABS
10.0000 mg | ORAL_TABLET | Freq: Three times a day (TID) | ORAL | Status: DC
  Administered 2024-03-28 – 2024-03-30 (×6): 10 mg via ORAL
  Filled 2024-03-28 (×6): qty 2

## 2024-03-28 MED ORDER — LACTATED RINGERS IV BOLUS
1000.0000 mL | Freq: Once | INTRAVENOUS | Status: AC
  Administered 2024-03-28: 1000 mL via INTRAVENOUS

## 2024-03-28 MED ORDER — ORAL CARE MOUTH RINSE
15.0000 mL | OROMUCOSAL | Status: DC
  Administered 2024-03-29 – 2024-03-31 (×9): 15 mL via OROMUCOSAL

## 2024-03-28 MED ORDER — LACTATED RINGERS IV SOLN: INTRAVENOUS | Status: DC

## 2024-03-28 MED ORDER — INSULIN GLARGINE 100 UNIT/ML ~~LOC~~ SOLN
10.0000 [IU] | Freq: Every day | SUBCUTANEOUS | Status: DC
  Administered 2024-03-28: 10 [IU] via SUBCUTANEOUS
  Filled 2024-03-28 (×2): qty 0.1

## 2024-03-28 MED ORDER — AMIODARONE HCL IN DEXTROSE 360-4.14 MG/200ML-% IV SOLN
60.0000 mg/h | INTRAVENOUS | Status: AC
  Administered 2024-03-28 (×2): 60 mg/h via INTRAVENOUS
  Filled 2024-03-28 (×3): qty 200

## 2024-03-28 MED ORDER — ONDANSETRON HCL 4 MG/2ML IJ SOLN
4.0000 mg | Freq: Four times a day (QID) | INTRAMUSCULAR | Status: DC | PRN
  Administered 2024-03-28 – 2024-04-05 (×3): 4 mg via INTRAVENOUS
  Filled 2024-03-28 (×3): qty 2

## 2024-03-28 MED ORDER — DOCUSATE SODIUM 100 MG PO CAPS: 100.0000 mg | ORAL_CAPSULE | Freq: Two times a day (BID) | ORAL | Status: DC | PRN

## 2024-03-28 MED ORDER — LACTULOSE 10 GM/15ML PO SOLN
30.0000 g | ORAL | Status: AC
Start: 2024-03-28 — End: 2024-03-28
  Administered 2024-03-28 (×4): 30 g via ORAL
  Filled 2024-03-28 (×4): qty 45

## 2024-03-28 MED ORDER — NOREPINEPHRINE 4 MG/250ML-% IV SOLN
0.0000 ug/min | INTRAVENOUS | Status: DC
  Administered 2024-03-28: 2 ug/min via INTRAVENOUS
  Filled 2024-03-28: qty 250

## 2024-03-28 NOTE — H&P (Signed)
 NAME:  Toni [name], MRN:  [id number], DOB:  04-25-1933, LOS: 0 ADMISSION DATE:  03/27/2024, CONSULTATION DATE:  03/28/24 REFERRING MD:  EDP, CHIEF COMPLAINT:  hypotension   History of Present Illness:  89 yo female presented via EMS after reported coffee ground emesis. She was noted to have BP's in the 50's-70's, given ivf with transient improvement. Per report pt was increasingly weak and lethargic today developed n/v. She is typically alert but was progressively confused over the course of the day as well. All history is obtained from chart 2/2 pt's acute encephalopathy. Pt has not had a bowel movement for 5 days and has been suffering from abdominal distention and pain when today she began having nausea with emesis.   Pt reportedly has frequent uti's in past. Ua pending. Lactate elevated on presentation but improving to 3 with volume. Ultimately, she required initiation of norepi to maintain BP. Found to be in aki with Cr 1.5 (baseline 0.7) with some hydronephrosis 2/2 R staghorn stone. There is also stigmata of espophagitis and colitis.   Pertinent  Medical History  HFpEF T2dm with hyperglycemia Htn Obesity cirrhosis  Significant Hospital Events: Including procedures, antibiotic start and stop dates in addition to other pertinent events   Admitted to icu 9/10  Interim History / Subjective:    Objective    Blood pressure 96/61, pulse (!) 120, temperature 98 F (36.7 C), temperature source Oral, resp. rate (!) 43, SpO2 95%.        Intake/Output Summary (Last 24 hours) at 03/28/2024 9786 Last data filed at 03/28/2024 0135 Gross per 24 hour  Intake 3667.13 ml  Output --  Net 3667.13 ml   There were no vitals filed for this visit.  Examination: General: nad, reclining in bed supine in nad HENT: ncat poor dentition, eomi, perrla, mm dry but pink Lungs: ctab Cardiovascular: irreg irreg Abdomen: obese, nt, nd bs + Extremities: + edema b/l le, no c/c Neuro: no focal deficits,  arousable but not following commands consistently, oriented to self and place GU: deferred  Resolved problem list   Assessment and Plan  Sepsis multi source, uti and colitis Shock 2/2 above Dehydration Emesis, +/-hematemesis  Constipation Aki with hydronephrosis Staghorn nephrolithiasis, obstructive Esophagitis HFpEF (without acute exacerbation) Cirrhosis with elevated ammonia Acute metabolic encephalopathy Lactic acidosis: improving, metabolic acidosis Elevated tsh Afib on chronic a/c -empiric abx -f/u cx, de-escalate as able currently covering for gp/gn and anaerobes -cont volume resuscitation, will change to bicarb at this time with bicarb on 15 -follow renal indices and uop -edp has contacted urology re: findings of obstructing stone and hydro -support care for n/v, npo for now may need ng tube -bid ppi  -will need lactulose  for ammonia however unable to tolerate oral at this time and with severe constipation doubtful enema will be beneficial.  -needs disimpaction  -check free t4/t3 -titrate vasopressor to map >65 -repeat echo pending, last one in 2018 -ssi  -holding a/c for now with hematemesis and active vomiting.  -if prolonged hold will need heparin  gtt.    Labs   CBC: Recent Labs  Lab 03/27/24 2007 03/27/24 2023  WBC 14.3*  --   NEUTROABS 10.8*  --   HGB 14.9 16.0*  16.7*  HCT 48.5* 47.0*  49.0*  MCV 88.8  --   PLT 398  --     Basic Metabolic Panel: Recent Labs  Lab 03/27/24 2007 03/27/24 2023  NA 129* 127*  128*  K 5.6* 5.4*  5.4*  CL 92* 99  CO2 15*  --   GLUCOSE 317* 328*  BUN 43* 45*  CREATININE 1.57* 1.40*  CALCIUM 9.4  --    GFR: CrCl cannot be calculated (Unknown ideal weight.). Recent Labs  Lab 03/27/24 2007 03/27/24 2024 03/27/24 2255 03/27/24 2323  WBC 14.3*  --   --   --   LATICACIDVEN  --  6.9* 3.8* 3.2*    Liver Function Tests: Recent Labs  Lab 03/27/24 2007  AST 30  ALT 13  ALKPHOS 85  BILITOT 0.7  PROT  7.3  ALBUMIN  2.6*   No results for input(s): LIPASE, AMYLASE in the last 168 hours. Recent Labs  Lab 03/27/24 2056  AMMONIA 51*    ABG    Component Value Date/Time   PHART 7.401 08/31/2016 2036   PCO2ART 42.9 08/31/2016 2036   PO2ART 167.0 (H) 08/31/2016 2036   HCO3 18.5 (L) 03/27/2024 2023   TCO2 17 (L) 03/27/2024 2023   TCO2 19 (L) 03/27/2024 2023   ACIDBASEDEF 4.0 (H) 03/27/2024 2023   O2SAT 95 03/27/2024 2023     Coagulation Profile: No results for input(s): INR, PROTIME in the last 168 hours.  Cardiac Enzymes: No results for input(s): CKTOTAL, CKMB, CKMBINDEX, TROPONINI in the last 168 hours.  HbA1C: Hemoglobin A1C  Date/Time Value Ref Range Status  09/20/2016 12:00 AM 7.2  Final   Hgb A1c MFr Bld  Date/Time Value Ref Range Status  03/24/2022 06:28 PM 7.8 (H) 4.8 - 5.6 % Final    Comment:    (NOTE) Pre diabetes:          5.7%-6.4%  Diabetes:              >6.4%  Glycemic control for   <7.0% adults with diabetes     CBG: No results for input(s): GLUCAP in the last 168 hours.  Review of Systems:   As per HPI  Past Medical History:  She,  has a past medical history of Diabetes mellitus without complication (HCC), Hypertension, Peripheral vascular disease (HCC), and Plantar fascial fibromatosis.   Surgical History:   Past Surgical History:  Procedure Laterality Date   ABDOMINAL HYSTERECTOMY     partial   ECTOPIC PREGNANCY SURGERY     JOINT REPLACEMENT Bilateral    TKR   ORIF FEMUR FRACTURE Left 03/25/2022   Procedure: OPEN REDUCTION INTERNAL FIXATION LEFT FEMUR;  Surgeon: Celena Sharper, MD;  Location: [facility] OR;  Service: Orthopedics;  Laterality: Left;   TEE WITHOUT CARDIOVERSION N/A 09/07/2016   Procedure: TRANSESOPHAGEAL ECHOCARDIOGRAM (TEE);  Surgeon: Annabella Scarce, MD;  Location: [facility] ENDOSCOPY;  Service: Cardiovascular;  Laterality: N/A;     Social History:   reports that she has never smoked. She has never used smokeless  tobacco. She reports that she does not drink alcohol and does not use drugs.   Family History:  Her family history includes Diabetes in her sister; Heart disease in her father; Hyperlipidemia in her father and mother; Hypertension in her father and mother; Other in her sister.   Allergies No Known Allergies   Home Medications  Prior to Admission medications   Medication Sig Start Date End Date Taking? Authorizing Provider  acetaminophen  (TYLENOL ) 500 MG tablet Take 500 mg by mouth daily.    [provider]  albuterol  (PROVENTIL ) (2.5 MG/3ML) 0.083% nebulizer solution Take 3 mLs (2.5 mg total) by nebulization every 4 (four) hours as needed for wheezing or shortness of breath. 09/09/16   Levern Beckey HERO, MD  aspirin  81 MG chewable tablet Chew 81 mg by mouth every evening.    [provider]  bisacodyl  (DULCOLAX) 5 MG EC tablet Take 1 tablet (5 mg total) by mouth daily as needed for moderate constipation. 03/29/22   Sherrill Cable Latif, DO  carvedilol  (COREG ) 3.125 MG tablet Take 1 tablet (3.125 mg total) by mouth 2 (two) times daily with a meal. 03/29/22   Sheikh, Omair Latif, DO  cholecalciferol  (CHOLECALCIFEROL ) 25 MCG tablet Take 2 tablets (2,000 Units total) by mouth 2 (two) times daily. 03/29/22   Sheikh, Omair Latif, DO  feeding supplement (ENSURE ENLIVE / ENSURE PLUS) LIQD Take 237 mLs by mouth 2 (two) times daily between meals. Patient not taking: Reported on 05/24/2022 03/29/22   Sherrill Cable Latif, DO  furosemide  (LASIX ) 40 MG tablet Take 40 mg by mouth daily.    [provider]  HYDROcodone -acetaminophen  (NORCO) 5-325 MG tablet Take 1-2 tablets by mouth every 6 (six) hours as needed for severe pain. Patient not taking: Reported on 05/24/2022 03/28/22   McBane, Caroline N, PA-C  Menthol, Topical Analgesic, (BIOFREEZE) 4 % GEL Apply 1 Application topically 2 (two) times daily.    [provider]  Multiple Vitamins-Minerals (CENTRUM SILVER PO) Take 1 tablet by  mouth daily.     [provider]  Nutritional Supplements (NUTRITIONAL SUPPLEMENT PO) Take 237 mLs by mouth 2 (two) times daily.    [provider]  ondansetron  (ZOFRAN ) 4 MG tablet Take 1 tablet (4 mg total) by mouth every 6 (six) hours as needed for nausea. 03/29/22   Sheikh, Omair Latif, DO  Polyethyl Glycol-Propyl Glycol (SYSTANE ULTRA) 0.4-0.3 % SOLN Place 2 drops into both eyes 2 (two) times daily as needed (for dry eyes).    [provider]  Potassium Chloride  ER 20 MEQ TBCR Take 20 mEq by mouth daily.    [provider]  senna-docusate (SENOKOT-S) 8.6-50 MG tablet Take 1 tablet by mouth at bedtime as needed for mild constipation or moderate constipation. 05/24/22   Long, Fonda MATSU, MD  simvastatin  (ZOCOR ) 40 MG tablet Take 40 mg by mouth daily.     [provider]  Vitamin D , Ergocalciferol , (DRISDOL ) 1.25 MG (50000 UNIT) CAPS capsule Take 1 capsule (50,000 Units total) by mouth every 7 (seven) days. Patient not taking: Reported on 05/24/2022 04/02/22   Sheikh, Omair Latif, DO  XARELTO  10 MG TABS tablet Take 10 mg by mouth daily. 04/27/22   [provider]     Critical care time: 

## 2024-03-28 NOTE — Progress Notes (Signed)
 [facility] ADULT ICU REPLACEMENT PROTOCOL   The patient does apply for the [facility] Adult ICU Electrolyte Replacment Protocol based on the criteria listed below:   1.Exclusion criteria: TCTS, ECMO, Dialysis, and Myasthenia Gravis patients 2. Is GFR >/= 30 ml/min? Yes.    Patient's GFR today is 42 3. Is SCr </= 2? Yes.   Patient's SCr is 1.22 mg/dL 4. Did SCr increase >/= 0.5 in 24 hours? No. 5.Pt's weight >40kg  Yes.   6. Abnormal electrolyte(s):   Mg 1.5  7. Electrolytes replaced per protocol 8.  Call MD STAT for K+ </= 2.5, Phos </= 1, or Mag </= 1 Physician:  CLEMENTEEN Epimenio Blackbird R Sylvester Minton 03/28/2024 5:32 AM

## 2024-03-28 NOTE — Procedures (Signed)
 Cortrak  Person Inserting Tube:  Mady Dolly, RD Tube Type:  Cortrak - 43 inches Tube Size:  10 Tube Location:  Right nare Secured by: Bridle Initial Placement:  Gastric Technique Used to Measure Tube Placement:  Marking at nare/corner of mouth Cortrak Secured At:  64 cm   Cortrak Tube Team Note:  Consult received to place a Cortrak feeding tube.   No x-ray is required. RN may begin using tube.   If the tube becomes dislodged please keep the tube and contact the Cortrak team at [email or web address] for replacement.  If after hours and replacement cannot be delayed, place a NG tube and confirm placement with an abdominal x-ray.   Dolly Mady MS, RD, LDN Registered Dietitian Clinical Nutrition RD Inpatient Contact Info in Amion

## 2024-03-28 NOTE — ED Notes (Signed)
 Patient brief was saturated with urine and small amount of soft stool. Cleaned and new brief applied.

## 2024-03-28 NOTE — Consult Note (Addendum)
 Urology Consult Note   Requesting Attending Physician:  Gretta Leita SQUIBB, DO Service Providing Consult: Urology  Consulting Attending: Dr. Renda   Reason for Consult:    HPI: Toni [name] previously followed by Dr. Glendia Elizabeth is seen in consultation for reasons noted above at the request of Gretta Leita SQUIBB, DO. Patient is a 89 y.o. female presenting to [facility] emergency department via EMS with reported coffee-ground emesis, hypotension, weakness, N/V, and worsening confusion.  PMH significant for HFpEF, cirrhosis, HTN, UTI, and obesity. ------------------  Assessment:   89 y.o. female with right staghorn calculus and severe constipation   Recommendations: # Right staghorn stone # Mild right hydronephrosis # Severe constipation # Sepsis  Likely urinary source.  Agree with broad ABX while awaiting speciation.  Presumably this will again be Proteus considering her staghorn stone  Renal function mildly elevated.  Trend labs  CT A/P notes right staghorn calculus extending from kidney into right ureter, new finding compared to most recent 2023 CT. Enhancing dilated proximal right ureter.  Large left bladder diverticulum.  Outpatient follow-up once patient is optimized for consideration of PCNL.   Very large stool ball measuring 19 x 12 x 20cm in the largest aspect taking up the pelvis and almost certainly causing some degree of obstruction to the bladder outlet.  Her hydronephrosis may be multifactorial, including vesicoureteral reflux.  The bladder diverticulum could also be d/t ongoing obstruction.  Patient has been manually disimpacted twice and is receiving scheduled lactulose .  Consider KUB after completion of bowel regimen  Continue Foley catheter and consider renal ultrasound in a few days, depending on the success of her bowel regimen.  No urgent surgical indications at this time.  Urology will follow along peripherally   Case and plan discussed with Dr.  Renda  Past Medical History: Past Medical History:  Diagnosis Date   Diabetes mellitus without complication (HCC)    Hypertension    Peripheral vascular disease (HCC)    Plantar fascial fibromatosis     Past Surgical History:  Past Surgical History:  Procedure Laterality Date   ABDOMINAL HYSTERECTOMY     partial   ECTOPIC PREGNANCY SURGERY     JOINT REPLACEMENT Bilateral    TKR   ORIF FEMUR FRACTURE Left 03/25/2022   Procedure: OPEN REDUCTION INTERNAL FIXATION LEFT FEMUR;  Surgeon: Celena Sharper, MD;  Location: [facility] OR;  Service: Orthopedics;  Laterality: Left;   TEE WITHOUT CARDIOVERSION N/A 09/07/2016   Procedure: TRANSESOPHAGEAL ECHOCARDIOGRAM (TEE);  Surgeon: Annabella Scarce, MD;  Location: [facility] ENDOSCOPY;  Service: Cardiovascular;  Laterality: N/A;    Medication: Current Facility-Administered Medications  Medication Dose Route Frequency Provider Last Rate Last Admin   albumin  human 5 % solution 25 g  25 g Intravenous Q8H ClarkLeita P, DO 60 mL/hr at 03/28/24 1351 25 g at 03/28/24 1351   amiodarone  (NEXTERONE  PREMIX) 360-4.14 MG/200ML-% (1.8 mg/mL) IV infusion  30 mg/hr Intravenous Continuous Ogan, Okoronkwo U, MD 16.67 mL/hr at 03/28/24 1230 30 mg/hr at 03/28/24 1230   Chlorhexidine  Gluconate Cloth 2 % PADS 6 each  6 each Topical Daily Layman Raisin, DO   6 each at 03/28/24 9061   docusate sodium  (COLACE) capsule 100 mg  100 mg Oral BID PRN Layman Raisin, DO       feeding supplement (VITAL AF 1.2 CAL) liquid 1,000 mL  1,000 mL Per Tube Continuous Gretta Leita P, DO 20 mL/hr at 03/28/24 1404 1,000 mL at 03/28/24 1404   Gerhardt's butt cream  Topical PRN Gleason, Laura R, PA-C       heparin  injection 5,000 Units  5,000 Units Subcutaneous Q8H Layman Raisin, DO   5,000 Units at 03/28/24 1300   insulin  aspart (novoLOG ) injection 0-15 Units  0-15 Units Subcutaneous Q4H Layman Raisin, DO   5 Units at 03/28/24 1230   insulin  glargine (LANTUS ) injection 10 Units  10  Units Subcutaneous Daily Gretta Doffing P, DO       lactated ringers  bolus 500 mL  500 mL Intravenous Once Gleason, Doffing SAUNDERS, PA-C       lactulose  (CHRONULAC ) 10 GM/15ML solution 30 g  30 g Oral Q2H Gretta Doffing P, DO   30 g at 03/28/24 1356   midodrine  (PROAMATINE ) tablet 10 mg  10 mg Oral Q8H Gretta Doffing P, DO   10 mg at 03/28/24 1301   norepinephrine  (LEVOPHED ) 4mg  in (0.016 mg/mL) premix infusion  0-40 mcg/min Intravenous Continuous Raford Lenis, MD 22.5 mL/hr at 03/28/24 1230 6 mcg/min at 03/28/24 1230   ondansetron  (ZOFRAN ) injection 4 mg  4 mg Intravenous Q6H PRN Layman Raisin, DO       Oral care mouth rinse  15 mL Mouth Rinse 4 times per day Layman Raisin, DO   15 mL at 03/28/24 1231   Oral care mouth rinse  15 mL Mouth Rinse PRN Layman Raisin, DO       pantoprazole  (PROTONIX ) injection 40 mg  40 mg Intravenous Q12H Layman Raisin, DO   40 mg at 03/28/24 9061   piperacillin -tazobactam (ZOSYN ) IVPB 3.375 g  3.375 g Intravenous Q8H Clair Lynwood CROME, RPH 12.5 mL/hr at 03/28/24 1302 3.375 g at 03/28/24 1302   polyethylene glycol (MIRALAX  / GLYCOLAX ) packet 17 g  17 g Oral Daily PRN Layman Raisin, DO       thiamine  (VITAMIN B1) tablet 100 mg  100 mg Per Tube Daily Gretta Doffing P, DO   100 mg at 03/28/24 1356   [START ON 03/29/2024] vancomycin  (VANCOCIN ) IVPB 1000 mg/200 mL premix  1,000 mg Intravenous Q48H Ledford, James L, RPH        Allergies: No Known Allergies  Social History: Social History   Tobacco Use   Smoking status: Never   Smokeless tobacco: Never  Substance Use Topics   Alcohol use: No   Drug use: No    Family History Family History  Problem Relation Age of Onset   Hypertension Mother    Hyperlipidemia Mother        before age 22   Heart disease Father    Hypertension Father    Hyperlipidemia Father        before age 77   Diabetes Sister    Other Sister        amputation    Review of Systems  Unable to perform ROS: Acuity of condition      Objective   Vital signs in last 24 hours: BP 105/60   Pulse 94   Temp 98.4 F (36.9 C) (Axillary)   Resp (!) 33   Ht 5' 2 (1.575 m)   Wt 89.6 kg   SpO2 100%   BMI 36.13 kg/m   Physical Exam General: Patient sleeping  HEENT: [place]/AT Pulmonary: Normal work of breathing Cardiovascular: no cyanosis Abdomen: Soft, NTTP, nondistended GU: Foley catheter in place draining clear yellow urine   Most Recent Labs: Lab Results  Component Value Date   WBC 11.7 (H) 03/28/2024   HGB 11.7 (L) 03/28/2024   HCT 36.9 03/28/2024  PLT 263 03/28/2024    Lab Results  Component Value Date   NA 137 03/28/2024   K 3.8 03/28/2024   CL 97 (L) 03/28/2024   CO2 23 03/28/2024   BUN 42 (H) 03/28/2024   CREATININE 1.01 (H) 03/28/2024   CALCIUM 8.1 (L) 03/28/2024   MG 2.5 (H) 03/28/2024   PHOS 2.4 (L) 03/28/2024    Lab Results  Component Value Date   INR 1.1 03/24/2022   APTT 26 08/31/2016     Urine Culture: @[id number] (laburin,org,[id number],[id number])@   IMAGING: US  RENAL Result Date: 03/28/2024 CLINICAL DATA:  [id number] AKI (acute kidney injury) (HCC) [id number] EXAM: RENAL / URINARY TRACT ULTRASOUND COMPLETE COMPARISON:  03/27/2024, 06/11/2022 FINDINGS: Right Kidney: Renal measurements: 10.2 x 5.7 x 5.9 cm = volume: 178 mL.Normal echogenicity. Unchanged upper pole cyst measuring 8.7 x 4.4 x 5.4 cm. Similar appearance of a large, staghorn calculus in the right renal collecting system. Trace pelviectasis without significant hydronephrosis. Left Kidney: Renal measurements: 11.7 x 6.8 x 6.2 cm = volume: 256 mL. Normal echogenicity. Upper pole cyst again noted measuring 4.3 x 5.2 x 6.6 cm. No hydronephrosis or nephrolithiasis. Bladder: Completely decompressed with a urinary catheter retention balloon in place. Other: None. IMPRESSION: Redemonstration of a large staghorn calculus in the right renal collecting system. No significant hydronephrosis in either kidney. Electronically Signed   By: Rogelia Myers M.D.   On: 03/28/2024 11:10   DG Abd 1 View Result Date: 03/28/2024 CLINICAL DATA:  141880 SOB (shortness of breath) [id number] Abdominal pain [id number]. EXAM: ABDOMEN - 1 VIEW COMPARISON:  03/27/2024. FINDINGS: Redemonstration of dilated and redundant sigmoid colon loop which exhibit moderate-to-large amount of fecal material. There is also gaseous distention of the stomach. Findings are grossly similar to the prior exam. No evidence of pneumoperitoneum. No acute osseous abnormalities. The soft tissues are within normal limits. Patient's known right staghorn calculus is not well seen. Surgical changes, devices, tubes and lines: Partially seen left proximal femur metallic hardware. IMPRESSION: Redemonstration of dilated and redundant sigmoid colon loop which exhibit moderate-to-large amount of fecal material. There is also gaseous distention of the stomach. Findings are grossly similar to the prior exam. Electronically Signed   By: Ree Molt M.D.   On: 03/28/2024 11:05   DG CHEST PORT 1 VIEW Result Date: 03/28/2024 CLINICAL DATA:  141880 SOB (shortness of breath) [id number] [id number] Abdominal pain [id number] EXAM: PORTABLE CHEST 1 VIEW COMPARISON:  03/27/2024. FINDINGS: Low lung volume. Elevated right hemidiaphragm noted. Bilateral lung fields are clear. Bilateral costophrenic angles are clear. Stable cardio-mediastinal silhouette. No acute osseous abnormalities. The soft tissues are within normal limits. IMPRESSION: No active disease. Electronically Signed   By: Ree Molt M.D.   On: 03/28/2024 11:03   DG Abdomen 1 View Result Date: 03/28/2024 CLINICAL DATA:  Coffee-ground emesis per epic notes EXAM: ABDOMEN - 1 VIEW COMPARISON:  03/24/2022, CT 03/27/2024 FINDINGS: Persistent air distension of the colon. Large retained feces at the rectum with increased stool burden. Faintly visible staghorn calculus in the region of right kidney. IMPRESSION: Persistent air distension of the colon with large  retained feces at the rectum. Faintly visible right staghorn calculus Electronically Signed   By: Luke Bun M.D.   On: 03/28/2024 00:15   CT ABDOMEN PELVIS W CONTRAST Result Date: 03/27/2024 EXAM: CT ABDOMEN AND PELVIS WITH CONTRAST 03/27/2024 10:37:00 PM TECHNIQUE: CT of the abdomen and pelvis was performed with the administration of intravenous contrast. Multiplanar reformatted images are provided for review. Automated exposure  control, iterative reconstruction, and/or weight-based adjustment of the mA/kV was utilized to reduce the radiation dose to as low as reasonably achievable. COMPARISON: CT 06/11/2022 CLINICAL HISTORY: Abdominal pain, acute, nonlocalized. FINDINGS: LOWER CHEST: No acute abnormality. LIVER: Stable hepatic cysts. Cirrhosis. Cavernous transformation of the portal vein. GALLBLADDER AND BILE DUCTS: Gallbladder is unremarkable. No biliary ductal dilatation. SPLEEN: No acute abnormality. PANCREAS: No acute abnormality. ADRENAL GLANDS: No acute abnormality. KIDNEYS, URETERS AND BLADDER: Staghorn calculus in the right kidney extending into the proximal right ureter. Mild right hydronephrosis and dilation of the proximal right ureter. This is new compared to the most recent comparison of 06/11/2022. Linear calcification along the posterior left bladder and layering posteriorly in the large left bladder diverticulum. GI AND BOWEL: Marked wall thickening of the distal esophagus. Large stool ball in the rectum with mild rectal wall thickening and trace adjacent stranding. The finding is concerning for impaction and stercoral colitis. Diverticulosis without evidence of diverticulitis. The sigmoid colon herniates into the left inguinal hernia without evidence of obstruction. PERITONEUM AND RETROPERITONEUM: No ascites. No free air. VASCULATURE: Aorta is normal in caliber. LYMPH NODES: No lymphadenopathy. REPRODUCTIVE ORGANS: No acute abnormality. BONES AND SOFT TISSUES: Postoperative changes of the  left femur. Advanced arthritis left hip. Demineralization. Small fat and fluid-containing right inguinal hernia. IMPRESSION: 1. Large stool ball in the rectum with mild rectal wall thickening and trace adjacent stranding, concerning for impaction and stercoral colitis. The colon is dilated upstream from the rectal stool ball. 2. Staghorn calculus in the right kidney extending into the proximal right ureter with mild right hydronephrosis and dilation of the proximal right ureter, new compared to 06/11/2022. 3. Marked wall thickening of the distal esophagus, esophagitis. Infiltrative mass not excluded. 4. Sigmoid colon herniates into the left inguinal hernia without evidence of obstruction. 5. 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 Electronically signed by: Norman Gatlin MD 03/27/2024 10:52 PM EDT RP Workstation: [id number]   CT Head Wo Contrast Result Date: 03/27/2024 CLINICAL DATA:  Mental status change, unknown cause EXAM: CT HEAD WITHOUT CONTRAST TECHNIQUE: Contiguous axial images were obtained from the base of the skull through the vertex without intravenous contrast. RADIATION DOSE REDUCTION: This exam was performed according to the departmental dose-optimization program which includes automated exposure control, adjustment of the mA and/or kV according to patient size and/or use of iterative reconstruction technique. COMPARISON:  08/31/2016 FINDINGS: Brain: No acute intracranial abnormality. Specifically, no hemorrhage, hydrocephalus, mass lesion, acute infarction, or significant intracranial injury. Mild age related atrophy. Vascular: No hyperdense vessel or unexpected calcification. Skull: No acute calvarial abnormality. Sinuses/Orbits: No acute findings Other: None IMPRESSION: No acute intracranial abnormality. Electronically Signed   By: Franky Crease M.D.   On: 03/27/2024 22:46   DG Chest Portable 1 View Result Date: 03/27/2024 CLINICAL DATA:  Shortness of breath EXAM: PORTABLE  CHEST 1 VIEW COMPARISON:  03/28/2022 FINDINGS: Hypoventilatory changes. Subsegmental atelectasis at the right base. Mitral annular calcification. Stable cardiomediastinal silhouette. No pleural effusion or pneumothorax IMPRESSION: Hypoventilatory changes with subsegmental atelectasis at the right base. Electronically Signed   By: Luke Bun M.D.   On: 03/27/2024 20:40    ------  Toni Bourdon, NP Pager: [phone]   Please contact the urology consult pager with any further questions/concerns.   Addendum:  Agree with above.  Pt previously followed by Dr. Gaston.  Can f/u with him as an outpatient.

## 2024-03-28 NOTE — Progress Notes (Signed)
 Initial Nutrition Assessment  DOCUMENTATION CODES:   Non-severe (moderate) malnutrition in context of acute illness/injury, Obesity unspecified  INTERVENTION:   Tube Feeding via Cortrak:  Vital AF 1.2 at 55 ml/hr Begin TF at rate of 20 ml/hr, titrate by 10 mL q 8 hours until goal rate of 55 ml/hr TF at goal provides 99 g of protein, 1584 kcals, 1069 mL off free water   Concerned re: possible refeeding risk. Monitor magnesium , potassium, and phosphorus daily for at least 3 days, MD to replete as needed.  No phosphorus this admission, plan to order as add-on to AM labs. Need baseline phosphorus prior to TF initiation, also recommend supplementation if low  Recommend addition of long acting/basal insulin  given hx of DM, elevated CBGs. With initiation of TF, pt may also benefit from additional TF coverage with schedule short acting insulin . Discussed with Dr. Gretta  Add Thiamine  100 mg daily x 7 days  NUTRITION DIAGNOSIS:   Moderate Malnutrition related to acute illness as evidenced by mild muscle depletion, energy intake < or equal to 50% for > or equal to 5 days.  GOAL:   Patient will meet greater than or equal to 90% of their needs  MONITOR:   Diet advancement, TF tolerance, Weight trends, Labs  REASON FOR ASSESSMENT:   Consult Enteral/tube feeding initiation and management, Assessment of nutrition requirement/status  ASSESSMENT:   89 yo female admitted with sepsis with UTI and colitis, +shock. Pt with increasing weakness and lethargy with development of N/V with constipation for about 1 week PTA; +AKI. PMH includes HFpEF, DM, HTN, obesity, cirrhosis  9/09 Abd xray with persistent air distention of colon with large retained feces at rectum.  9/10 Admitted to ICU, Cortrak, CT A/P: large stool ball in rectum, concerning for impaction and stercoral colitis, staghorn calculus in R. Kidney, mild R hydronephrosis and dilation of R ureter, marked wall thickening of distal esophagus,  esophagitis, infiltrative mass not excluded. Sigmoid colon herniates into the left inguinal hernia without obstruction. +Cirrhosis.   Pt currently not a good historian. Pt is awake and pleasant but confused. Daughter and Granddaughter present.   NPO currently, Cortrak placed for meds, nutrition.   Current wt 89.6 kg (197.5 pounds); daughter is not quite sure how much patient ways but believes she is around 185 pounds. Daughter reports she was due for her annual appointment on the 19th of this month and would have gotten a weight then.  Daughter reports pt eats 3 times per day at home but eats smaller portions that she used to. Breakfast might be 1 packet of instant grits, lunch might be half a salad. Pt takes MVI and Vitamin D  at home, no protein supplements recently . Over the past week or so, pt has been eating very poorly due to constipation  Pt essentially bed bound per daughter.   +large BM this morning, per family pt had another BM overnight. Abdomen softer  Abd xray today with gaseous distention of stomach, moderate to large amount of fecal material in dilated and redundant sigmoid   Noted elevated TSH, T3/T4 pending  Labs: CBGs 221-237 (Goal 140-180) BUN 42 Creatinine 1.01 Sodium 137, Serum Glucose 223 Magnesium  2.5 (H-previously low at 1.5) Ammonia 51  Meds:  SS Novolog  Lactulose  q 2 hours x 4 doses IV albumin  Midodrine  IV zosyn   NUTRITION - FOCUSED PHYSICAL EXAM: Noted loose skin in several areas, likely indicating hx of wt loss at some point in time. Expect some degree of wasting in BLE secondary to  bed bound status. BLE were at least moderate but some edema present and may be masking the severity   Flowsheet Row Most Recent Value  Orbital Region No depletion  Upper Arm Region No depletion  Thoracic and Lumbar Region Unable to assess  Buccal Region Mild depletion  Temple Region Mild depletion  Clavicle Bone Region Mild depletion  Clavicle and Acromion Bone  Region Mild depletion  Scapular Bone Region Mild depletion  Dorsal Hand Moderate depletion  Patellar Region Moderate depletion  Anterior Thigh Region Moderate depletion  Posterior Calf Region Moderate depletion  Edema (RD Assessment) Mild  Hair Reviewed  Eyes Reviewed  Mouth Unable to assess  [Pt indicated she could not open her mouth at this time]  Skin Other (Comment)  [dry flaky/scaly skin]  Nails Reviewed    Diet Order:   Diet Order             Diet NPO time specified Except for: Sips with Meds  Diet effective now                   EDUCATION NEEDS:   Education needs have been addressed  Skin:  Skin Assessment: Skin Integrity Issues: Skin Integrity Issues:: Stage I Stage I: anus  Last BM:  9/10 large type 6  Height:   Ht Readings from Last 1 Encounters:  03/28/24 5' 2 (1.575 m)    Weight:   Wt Readings from Last 1 Encounters:  03/28/24 89.6 kg     BMI:  Body mass index is 36.13 kg/m.  Estimated Nutritional Needs:   Kcal:  1550-1750 kcals  Protein:  90-110 g  Fluid:  >/=1.7 L   Betsey Finger MS, RDN, LDN, CNSC Registered Dietitian 3 Clinical Nutrition RD Inpatient Contact Info in Amion

## 2024-03-28 NOTE — Inpatient Diabetes Management (Addendum)
 Inpatient Diabetes Program Recommendations  AACE/ADA: New Consensus Statement on Inpatient Glycemic Control (2015)  Target Ranges:  Prepandial:   less than 140 mg/dL      Peak postprandial:   less than 180 mg/dL (1-2 hours)      Critically ill patients:  140 - 180 mg/dL   Lab Results  Component Value Date   GLUCAP 221 (H) 03/28/2024   HGBA1C 7.2 (H) 03/28/2024    Review of Glycemic Control  Latest Reference Range & Units 03/28/24 03:49 03/28/24 07:59  Glucose-Capillary 70 - 99 mg/dL 762 (H) 778 (H)   Diabetes history: DM 2 Outpatient Diabetes medications:  Metformin 500 mg bid Current orders for Inpatient glycemic control:  Novolog  0-15 units q 4 hours  Inpatient Diabetes Program Recommendations:    If CBG's remain>goal, consider adding Lantus  8 units daily.   Thanks,  Randall Bullocks, RN, BC-ADM Inpatient Diabetes Coordinator Pager [phone]  (8a-5p)

## 2024-03-28 NOTE — Progress Notes (Signed)
 NAME:  Toni [name], MRN:  [id number], DOB:  [birth year], LOS: 0 ADMISSION DATE:  03/27/2024, CONSULTATION DATE:  03/28/24 REFERRING MD:  EDP, CHIEF COMPLAINT:  hypotension   History of Present Illness:  89 yo female presented via EMS after reported coffee ground emesis. She was noted to have BP's in the 50's-70's, given ivf with transient improvement. Per report pt was increasingly weak and lethargic today developed n/v. She is typically alert but was progressively confused over the course of the day as well. All history is obtained from chart 2/2 pt's acute encephalopathy. Pt has not had a bowel movement for 5 days and has been suffering from abdominal distention and pain when today she began having nausea with emesis.   Pt reportedly has frequent uti's in past. Ua pending. Lactate elevated on presentation but improving to 3 with volume. Ultimately, she required initiation of norepi to maintain BP. Found to be in aki with Cr 1.5 (baseline 0.7) with some hydronephrosis 2/2 R staghorn stone. There is also stigmata of espophagitis and colitis.   Pertinent  Medical History  HFpEF T2dm with hyperglycemia Htn Obesity cirrhosis  Significant Hospital Events: Including procedures, antibiotic start and stop dates in addition to other pertinent events   Admitted to icu 9/10, borderline low BP, not on norepi  Interim History / Subjective:  Awake and calm but confused  MAP 65-68 Minimal UOP ~250cc since admission, +4L RVR overnight and now on amiodarone   Repeat lactic acid pending  Objective    Blood pressure 107/80, pulse (!) 102, temperature 98 F (36.7 C), temperature source Oral, resp. rate (!) 35, height 5' 2 (1.575 m), weight 89.6 kg, SpO2 100%.        Intake/Output Summary (Last 24 hours) at 03/28/2024 0758 Last data filed at 03/28/2024 0700 Gross per 24 hour  Intake 5031.98 ml  Output 125 ml  Net 4906.98 ml   Filed Weights   03/28/24 0322  Weight: 89.6 kg     General:  well  nourished, ill-appearing elderly F resting in bed in NAD HEENT: MM pink/moist, sclera anicteric Neuro: alert, follows commands, oriented to self, disoriented to date and situation CV: s1s2 rrr, no m/r/g PULM:  clear bilaterally on RA with mild tachypnea  GI: soft, obese, mild diffuse TTP Extremities: warm/dry, no edema, no skin mottling     Resolved problem list   Assessment and Plan   Sepsis multi source, uti and colitis Shock 2/2 above, lactic acidosis, metabolic acidosis with acute metabolic encephalopathy HFpEF  Cirrhosis with elevated ammonia Atrial Fibrillation -trend lactic acid, up-trending slightly, albumin  given -continue vanc/zosyn  and follow culture results -echo pending, appears atrial fibrillation may be new onset, continue amiodarone , repeat TSH WNL, T4 1.2, Xarelto  not filled for years and per pharmacy was for DVT prophy post-op -lactulose  for ammonia and constipation, may need digital disimpaction -not currently on pressors, but MAP's borderline, start midodrine        Emesis, +/-hematemesis  Constipation Esophagitis Inguinal Hernia -no further emesis or evidence of UGIB, continue PPI, with distal esophageal thickening will need outpatient GI follow up  -lactulose  and miralax  and may need digital disimpaction -no incarceration of hernia on CT, no current indication for surgical consult    Aki with hydronephrosis Staghorn nephrolithiasis Hypomagnesemia Creatinine down-trending but minimal UOP -renal US  -spoke with urology, large stone that will likely need outpatient percutaneous nephrolithotomy  -trend and replete electrolytes prn -maintain adequate renal perfusion and monitor renal indices   Type 2 DM -SSI  Labs   CBC: Recent Labs  Lab 03/27/24 2007 03/27/24 2023 03/28/24 0248  WBC 14.3*  --  11.7*  NEUTROABS 10.8*  --   --   HGB 14.9 16.0*  16.7* 11.7*  HCT 48.5* 47.0*  49.0* 36.9  MCV 88.8  --  87.2  PLT 398  --  263    Basic  Metabolic Panel: Recent Labs  Lab 03/27/24 2007 03/27/24 2023 03/28/24 0248  NA 129* 127*  128* 132*  K 5.6* 5.4*  5.4* 3.9  CL 92* 99 99  CO2 15*  --  21*  GLUCOSE 317* 328* 260*  BUN 43* 45* 40*  CREATININE 1.57* 1.40* 1.22*  CALCIUM 9.4  --  8.2*  MG  --   --  1.5*   GFR: Estimated Creatinine Clearance: 32.5 mL/min (A) (by C-G formula based on SCr of 1.22 mg/dL (H)). Recent Labs  Lab 03/27/24 2007 03/27/24 2024 03/27/24 2255 03/27/24 2323 03/28/24 0248  WBC 14.3*  --   --   --  11.7*  LATICACIDVEN  --  6.9* 3.8* 3.2*  --     Liver Function Tests: Recent Labs  Lab 03/27/24 2007  AST 30  ALT 13  ALKPHOS 85  BILITOT 0.7  PROT 7.3  ALBUMIN  2.6*   Recent Labs  Lab 03/27/24 2248  LIPASE <10*   Recent Labs  Lab 03/27/24 2056  AMMONIA 51*    ABG    Component Value Date/Time   PHART 7.401 08/31/2016 2036   PCO2ART 42.9 08/31/2016 2036   PO2ART 167.0 (H) 08/31/2016 2036   HCO3 18.5 (L) 03/27/2024 2023   TCO2 17 (L) 03/27/2024 2023   TCO2 19 (L) 03/27/2024 2023   ACIDBASEDEF 4.0 (H) 03/27/2024 2023   O2SAT 95 03/27/2024 2023     Coagulation Profile: No results for input(s): INR, PROTIME in the last 168 hours.  Cardiac Enzymes: No results for input(s): CKTOTAL, CKMB, CKMBINDEX, TROPONINI in the last 168 hours.  HbA1C: Hemoglobin A1C  Date/Time Value Ref Range Status  09/20/2016 12:00 AM 7.2  Final   Hgb A1c MFr Bld  Date/Time Value Ref Range Status  03/28/2024 02:48 AM 7.2 (H) 4.8 - 5.6 % Final    Comment:    (NOTE) Diagnosis of Diabetes The following HbA1c ranges recommended by the American Diabetes Association (ADA) may be used as an aid in the diagnosis of diabetes mellitus.  Hemoglobin             Suggested A1C NGSP%              Diagnosis  <5.7                   Non Diabetic  5.7-6.4                Pre-Diabetic  >6.4                   Diabetic  <7.0                   Glycemic control for                        adults with diabetes.    03/24/2022 06:28 PM 7.8 (H) 4.8 - 5.6 % Final    Comment:    (NOTE) Pre diabetes:          5.7%-6.4%  Diabetes:              >  6.4%  Glycemic control for   <7.0% adults with diabetes     CBG: Recent Labs  Lab 03/28/24 0349  GLUCAP 237*    Review of Systems:   As per HPI  Past Medical History:  She,  has a past medical history of Diabetes mellitus without complication (HCC), Hypertension, Peripheral vascular disease (HCC), and Plantar fascial fibromatosis.   Surgical History:   Past Surgical History:  Procedure Laterality Date   ABDOMINAL HYSTERECTOMY     partial   ECTOPIC PREGNANCY SURGERY     JOINT REPLACEMENT Bilateral    TKR   ORIF FEMUR FRACTURE Left 03/25/2022   Procedure: OPEN REDUCTION INTERNAL FIXATION LEFT FEMUR;  Surgeon: Celena Sharper, MD;  Location: [facility] OR;  Service: Orthopedics;  Laterality: Left;   TEE WITHOUT CARDIOVERSION N/A 09/07/2016   Procedure: TRANSESOPHAGEAL ECHOCARDIOGRAM (TEE);  Surgeon: Annabella Scarce, MD;  Location[facility] ENDOSCOPY;  Service: Cardiovascular;  Laterality: N/A;     Social History:   reports that she has never smoked. She has never used smokeless tobacco. She reports that she does not drink alcohol and does not use drugs.   Family History:  Her family history includes Diabetes in her sister; Heart disease in her father; Hyperlipidemia in her father and mother; Hypertension in her father and mother; Other in her sister.   Allergies No Known Allergies   Home Medications  Prior to Admission medications   Medication Sig Start Date End Date Taking? Authorizing Provider  acetaminophen  (TYLENOL ) 500 MG tablet Take 500 mg by mouth daily.    [provider]  albuterol  (PROVENTIL ) (2.5 MG/3ML) 0.083% nebulizer solution Take 3 mLs (2.5 mg total) by nebulization every 4 (four) hours as needed for wheezing or shortness of breath. 09/09/16   Levern Beckey HERO, MD  aspirin  81 MG chewable tablet Chew 81 mg by  mouth every evening.    [provider]  bisacodyl  (DULCOLAX) 5 MG EC tablet Take 1 tablet (5 mg total) by mouth daily as needed for moderate constipation. 03/29/22   Sherrill Cable Latif, DO  carvedilol  (COREG ) 3.125 MG tablet Take 1 tablet (3.125 mg total) by mouth 2 (two) times daily with a meal. 03/29/22   Sheikh, Cable Latif, DO  cholecalciferol  (CHOLECALCIFEROL ) 25 MCG tablet Take 2 tablets (2,000 Units total) by mouth 2 (two) times daily. 03/29/22   Sheikh, Omair Latif, DO  feeding supplement (ENSURE ENLIVE / ENSURE PLUS) LIQD Take 237 mLs by mouth 2 (two) times daily between meals. Patient not taking: Reported on 05/24/2022 03/29/22   Sherrill Cable Latif, DO  furosemide  (LASIX ) 40 MG tablet Take 40 mg by mouth daily.    [provider]  HYDROcodone -acetaminophen  (NORCO) 5-325 MG tablet Take 1-2 tablets by mouth every 6 (six) hours as needed for severe pain. Patient not taking: Reported on 05/24/2022 03/28/22   McBane, Caroline N, PA-C  Menthol, Topical Analgesic, (BIOFREEZE) 4 % GEL Apply 1 Application topically 2 (two) times daily.    [provider]  Multiple Vitamins-Minerals (CENTRUM SILVER PO) Take 1 tablet by mouth daily.     [provider]  Nutritional Supplements (NUTRITIONAL SUPPLEMENT PO) Take 237 mLs by mouth 2 (two) times daily.    [provider]  ondansetron  (ZOFRAN ) 4 MG tablet Take 1 tablet (4 mg total) by mouth every 6 (six) hours as needed for nausea. 03/29/22   Sheikh, Omair Latif, DO  Polyethyl Glycol-Propyl Glycol (SYSTANE ULTRA) 0.4-0.3 % SOLN Place 2 drops into both eyes 2 (two) times  daily as needed (for dry eyes).    [provider]  Potassium Chloride  ER 20 MEQ TBCR Take 20 mEq by mouth daily.    [provider]  senna-docusate (SENOKOT-S) 8.6-50 MG tablet Take 1 tablet by mouth at bedtime as needed for mild constipation or moderate constipation. 05/24/22   Long, Fonda MATSU, MD  simvastatin  (ZOCOR ) 40 MG tablet Take  40 mg by mouth daily.     [provider]  Vitamin D , Ergocalciferol , (DRISDOL ) 1.25 MG (50000 UNIT) CAPS capsule Take 1 capsule (50,000 Units total) by mouth every 7 (seven) days. Patient not taking: Reported on 05/24/2022 04/02/22   Sheikh, Omair Latif, DO  XARELTO  10 MG TABS tablet Take 10 mg by mouth daily. 04/27/22   [provider]     Critical care time: 40 mins additional critical care time     CRITICAL CARE Performed by: Leita SAUNDERS Shereena Berquist   Total critical care time: 40 minutes  Critical care time was exclusive of separately billable procedures and treating other patients.  Critical care was necessary to treat or prevent imminent or life-threatening deterioration.  Critical care was time spent personally by me on the following activities: development of treatment plan with patient and/or surrogate as well as nursing, discussions with consultants, evaluation of patient's response to treatment, examination of patient, obtaining history from patient or surrogate, ordering and performing treatments and interventions, ordering and review of laboratory studies, ordering and review of radiographic studies, pulse oximetry and re-evaluation of patient's condition.      Leita SAUNDERS Khristopher Kapaun, PA-C [facility] See Amion for pager If no response to pager , please call [phone] until 7pm After 7:00 pm call Elink  [phone]

## 2024-03-28 NOTE — Progress Notes (Addendum)
 eLink Physician-Brief Progress Note Patient Name: CANDID BOVEY DOB: 30-Jan-1933 MRN: [id number]   Date of Service  03/28/2024  HPI/Events of Note  Patient in atrial fibrillation with RVR (rate 120's).  eICU Interventions  Amiodarone  bolus + gtt ordered. New Patient Evaluation.        Champ Keetch U Stein Windhorst 03/28/2024, 3:39 AM

## 2024-03-28 NOTE — Progress Notes (Signed)
  Echocardiogram 2D Echocardiogram has been performed.  Toni [name] 03/28/2024, 5:55 PM

## 2024-03-28 NOTE — ED Provider Notes (Signed)
 Patient was to be admitted to hospitalist for sepsis likely urinary tract infection.  However, lactate has remained persistently elevated and blood pressure is now low and spite of IV fluids.  I have ordered additional IV fluids and initiation of norepinephrine  infusion.  I believe she will need to be admitted to the critical care service.  I have discussed the case with Dr. Layman of critical care service who agrees to come to evaluate the patient.   CT scan report stated right hydronephrosis.  However, when I reviewed the images, I did not think there was significant hydronephrosis.  I have discussed case with Dr. Alvaro, on-call for urology, who has reviewed the CT scan and agrees that there is no significant hydronephrosis and no need for emergent drainage procedure.  CRITICAL CARE Performed by: Alm Lias Total critical care time: 85 minutes Critical care time was exclusive of separately billable procedures and treating other patients. Critical care was necessary to treat or prevent imminent or life-threatening deterioration. Critical care was time spent personally by me on the following activities: development of treatment plan with patient and/or surrogate as well as nursing, discussions with consultants, evaluation of patient's response to treatment, examination of patient, obtaining history from patient or surrogate, ordering and performing treatments and interventions, ordering and review of laboratory studies, ordering and review of radiographic studies, pulse oximetry and re-evaluation of patient's condition.   Lias Alm, MD 03/28/24 [phone]

## 2024-03-28 NOTE — Progress Notes (Signed)
 Pharmacy Antibiotic Note  Toni [name] is a 89 y.o. female admitted on 03/27/2024 with sepsis.  Pharmacy has been consulted for Vancomycin  dosing. WBC mildly elevated. Scr 1.22.   Plan: Vancomycin  1000 mg IV q48h >>>Estimated AUC: 448 Zosyn  per MD Trend WBC, temp, renal function  F/U infectious work-up Drug levels as indicated   Weight: 89.6 kg (197 lb 8.5 oz)  Temp (24hrs), Avg:99.3 F (37.4 C), Min:97.5 F (36.4 C), Max:102.4 F (39.1 C)  Recent Labs  Lab 03/27/24 2007 03/27/24 2023 03/27/24 2024 03/27/24 2255 03/27/24 2323 03/28/24 0248  WBC 14.3*  --   --   --   --  11.7*  CREATININE 1.57* 1.40*  --   --   --  1.22*  LATICACIDVEN  --   --  6.9* 3.8* 3.2*  --     CrCl cannot be calculated (Unknown ideal weight.).    No Known Allergies  Lynwood Mckusick, PharmD, BCPS Clinical Pharmacist Phone: [phone]

## 2024-03-29 ENCOUNTER — Inpatient Hospital Stay (HOSPITAL_COMMUNITY)

## 2024-03-29 DIAGNOSIS — Z7189 Other specified counseling: Secondary | ICD-10-CM | POA: Diagnosis not present

## 2024-03-29 DIAGNOSIS — A419 Sepsis, unspecified organism: Secondary | ICD-10-CM

## 2024-03-29 DIAGNOSIS — J9602 Acute respiratory failure with hypercapnia: Secondary | ICD-10-CM | POA: Diagnosis not present

## 2024-03-29 DIAGNOSIS — E872 Acidosis, unspecified: Secondary | ICD-10-CM

## 2024-03-29 DIAGNOSIS — N390 Urinary tract infection, site not specified: Secondary | ICD-10-CM

## 2024-03-29 DIAGNOSIS — Z515 Encounter for palliative care: Secondary | ICD-10-CM

## 2024-03-29 DIAGNOSIS — G9341 Metabolic encephalopathy: Secondary | ICD-10-CM | POA: Diagnosis not present

## 2024-03-29 DIAGNOSIS — J9601 Acute respiratory failure with hypoxia: Secondary | ICD-10-CM | POA: Diagnosis not present

## 2024-03-29 DIAGNOSIS — E162 Hypoglycemia, unspecified: Secondary | ICD-10-CM

## 2024-03-29 DIAGNOSIS — K5909 Other constipation: Secondary | ICD-10-CM

## 2024-03-29 LAB — GLUCOSE, CAPILLARY
Glucose-Capillary: 166 mg/dL — ABNORMAL HIGH (ref 70–99)
Glucose-Capillary: 150 mg/dL — ABNORMAL HIGH (ref 70–99)
Glucose-Capillary: 177 mg/dL — ABNORMAL HIGH (ref 70–99)
Glucose-Capillary: 173 mg/dL — ABNORMAL HIGH (ref 70–99)
Glucose-Capillary: 159 mg/dL — ABNORMAL HIGH (ref 70–99)

## 2024-03-29 LAB — POCT I-STAT 7, (LYTES, BLD GAS, ICA,H+H)
Acid-Base Excess: 2 mmol/L (ref 0.0–2.0)
Acid-Base Excess: 2 mmol/L (ref 0.0–2.0)
Acid-Base Excess: 3 mmol/L — ABNORMAL HIGH (ref 0.0–2.0)
Bicarbonate: 29.7 mmol/L — ABNORMAL HIGH (ref 20.0–28.0)
Bicarbonate: 29.5 mmol/L — ABNORMAL HIGH (ref 20.0–28.0)
Bicarbonate: 30 mmol/L — ABNORMAL HIGH (ref 20.0–28.0)
Calcium, Ion: 1.2 mmol/L (ref 1.15–1.40)
Calcium, Ion: 1.24 mmol/L (ref 1.15–1.40)
Calcium, Ion: 1.21 mmol/L (ref 1.15–1.40)
HCT: 31 % — ABNORMAL LOW (ref 36.0–46.0)
HCT: 32 % — ABNORMAL LOW (ref 36.0–46.0)
HCT: 31 % — ABNORMAL LOW (ref 36.0–46.0)
Hemoglobin: 10.5 g/dL — ABNORMAL LOW (ref 12.0–15.0)
Hemoglobin: 10.9 g/dL — ABNORMAL LOW (ref 12.0–15.0)
Hemoglobin: 10.5 g/dL — ABNORMAL LOW (ref 12.0–15.0)
O2 Saturation: 100 %
O2 Saturation: 98 %
O2 Saturation: 98 %
Patient temperature: 97.8
Patient temperature: 97.9
Patient temperature: 97.9
Potassium: 3.5 mmol/L (ref 3.5–5.1)
Potassium: 2.8 mmol/L — ABNORMAL LOW (ref 3.5–5.1)
Potassium: 2.8 mmol/L — ABNORMAL LOW (ref 3.5–5.1)
Sodium: 138 mmol/L (ref 135–145)
Sodium: 141 mmol/L (ref 135–145)
Sodium: 139 mmol/L (ref 135–145)
TCO2: 32 mmol/L (ref 22–32)
TCO2: 31 mmol/L (ref 22–32)
TCO2: 32 mmol/L (ref 22–32)
pCO2 arterial: 62 mmHg — ABNORMAL HIGH (ref 32–48)
pCO2 arterial: 58.6 mmHg — ABNORMAL HIGH (ref 32–48)
pCO2 arterial: 53.2 mmHg — ABNORMAL HIGH (ref 32–48)
pH, Arterial: 7.286 — ABNORMAL LOW (ref 7.35–7.45)
pH, Arterial: 7.308 — ABNORMAL LOW (ref 7.35–7.45)
pH, Arterial: 7.357 (ref 7.35–7.45)
pO2, Arterial: 214 mmHg — ABNORMAL HIGH (ref 83–108)
pO2, Arterial: 110 mmHg — ABNORMAL HIGH (ref 83–108)
pO2, Arterial: 109 mmHg — ABNORMAL HIGH (ref 83–108)

## 2024-03-29 LAB — COMPREHENSIVE METABOLIC PANEL WITH GFR
ALT: 19 U/L (ref 0–44)
AST: 29 U/L (ref 15–41)
Albumin: 2.8 g/dL — ABNORMAL LOW (ref 3.5–5.0)
Alkaline Phosphatase: 51 U/L (ref 38–126)
Anion gap: 10 (ref 5–15)
BUN: 35 mg/dL — ABNORMAL HIGH (ref 8–23)
CO2: 27 mmol/L (ref 22–32)
Calcium: 8.4 mg/dL — ABNORMAL LOW (ref 8.9–10.3)
Chloride: 101 mmol/L (ref 98–111)
Creatinine, Ser: 0.68 mg/dL (ref 0.44–1.00)
GFR, Estimated: >60 mL/min (ref >60)
Glucose, Bld: 177 mg/dL — ABNORMAL HIGH (ref 70–99)
Potassium: 3.2 mmol/L — ABNORMAL LOW (ref 3.5–5.1)
Sodium: 138 mmol/L (ref 135–145)
Total Bilirubin: 0.9 mg/dL (ref 0.0–1.2)
Total Protein: 5.5 g/dL — ABNORMAL LOW (ref 6.5–8.1)

## 2024-03-29 LAB — BASIC METABOLIC PANEL WITH GFR
Anion gap: 15 (ref 5–15)
BUN: 40 mg/dL — ABNORMAL HIGH (ref 8–23)
CO2: 24 mmol/L (ref 22–32)
Calcium: 8.3 mg/dL — ABNORMAL LOW (ref 8.9–10.3)
Chloride: 98 mmol/L (ref 98–111)
Creatinine, Ser: 0.84 mg/dL (ref 0.44–1.00)
GFR, Estimated: >60 mL/min (ref >60)
Glucose, Bld: 171 mg/dL — ABNORMAL HIGH (ref 70–99)
Potassium: 3.4 mmol/L — ABNORMAL LOW (ref 3.5–5.1)
Sodium: 137 mmol/L (ref 135–145)

## 2024-03-29 LAB — MAGNESIUM
Magnesium: 2.7 mg/dL — ABNORMAL HIGH (ref 1.7–2.4)
Magnesium: 2.7 mg/dL — ABNORMAL HIGH (ref 1.7–2.4)

## 2024-03-29 LAB — CBC
HCT: 33.5 % — ABNORMAL LOW (ref 36.0–46.0)
HCT: 34.2 % — ABNORMAL LOW (ref 36.0–46.0)
Hemoglobin: 10.4 g/dL — ABNORMAL LOW (ref 12.0–15.0)
Hemoglobin: 10.7 g/dL — ABNORMAL LOW (ref 12.0–15.0)
MCH: 27.7 pg (ref 26.0–34.0)
MCH: 27.7 pg (ref 26.0–34.0)
MCHC: 31 g/dL (ref 30.0–36.0)
MCHC: 31.3 g/dL (ref 30.0–36.0)
MCV: 89.3 fL (ref 80.0–100.0)
MCV: 88.6 fL (ref 80.0–100.0)
Platelets: 166 K/uL (ref 150–400)
Platelets: 160 K/uL (ref 150–400)
RBC: 3.75 MIL/uL — ABNORMAL LOW (ref 3.87–5.11)
RBC: 3.86 MIL/uL — ABNORMAL LOW (ref 3.87–5.11)
RDW: 13.2 % (ref 11.5–15.5)
RDW: 13.4 % (ref 11.5–15.5)
WBC: 14 K/uL — ABNORMAL HIGH (ref 4.0–10.5)
WBC: 11.6 K/uL — ABNORMAL HIGH (ref 4.0–10.5)
nRBC: 0.3 % — ABNORMAL HIGH (ref 0.0–0.2)
nRBC: 0.5 % — ABNORMAL HIGH (ref 0.0–0.2)

## 2024-03-29 LAB — PHOSPHORUS: Phosphorus: 3.2 mg/dL (ref 2.5–4.6)

## 2024-03-29 LAB — T3: T3, Total: 72 ng/dL (ref 71–180)

## 2024-03-29 LAB — VANCOMYCIN, RANDOM: Vancomycin Rm: 7 ug/mL

## 2024-03-29 LAB — LACTIC ACID, PLASMA: Lactic Acid, Venous: 1.9 mmol/L (ref 0.5–1.9)

## 2024-03-29 MED ORDER — POTASSIUM CHLORIDE 10 MEQ/100ML IV SOLN
10.0000 meq | INTRAVENOUS | Status: AC
  Administered 2024-03-29 (×4): 10 meq via INTRAVENOUS
  Filled 2024-03-29 (×4): qty 100

## 2024-03-29 MED ORDER — SMOG ENEMA
400.0000 mL | Freq: Once | RECTAL | Status: AC
  Administered 2024-03-29: 400 mL via RECTAL
  Filled 2024-03-29: qty 960

## 2024-03-29 MED ORDER — POTASSIUM CHLORIDE 20 MEQ PO PACK
40.0000 meq | PACK | Freq: Once | ORAL | Status: AC
  Administered 2024-03-29: 40 meq
  Filled 2024-03-29: qty 2

## 2024-03-29 MED ORDER — ACETAMINOPHEN 10 MG/ML IV SOLN: 1000.0000 mg | Freq: Once | INTRAVENOUS | Status: DC

## 2024-03-29 MED ORDER — POTASSIUM CHLORIDE CRYS ER 20 MEQ PO TBCR
40.0000 meq | EXTENDED_RELEASE_TABLET | Freq: Once | ORAL | Status: DC
Start: 2024-03-29 — End: 2024-03-29

## 2024-03-29 MED ORDER — INSULIN GLARGINE 100 UNIT/ML ~~LOC~~ SOLN
20.0000 [IU] | Freq: Every day | SUBCUTANEOUS | Status: DC
  Filled 2024-03-29: qty 0.2

## 2024-03-29 MED ORDER — INSULIN GLARGINE 100 UNIT/ML ~~LOC~~ SOLN
10.0000 [IU] | Freq: Once | SUBCUTANEOUS | Status: AC
Start: 2024-03-29 — End: 2024-03-29
  Administered 2024-03-29: 10 [IU] via SUBCUTANEOUS
  Filled 2024-03-29: qty 0.1

## 2024-03-29 MED ORDER — LACTULOSE 10 GM/15ML PO SOLN
30.0000 g | ORAL | Status: AC
  Administered 2024-03-29 (×4): 30 g via ORAL
  Filled 2024-03-29 (×3): qty 45

## 2024-03-29 MED ORDER — INSULIN ASPART 100 UNIT/ML IJ SOLN
2.0000 [IU] | INTRAMUSCULAR | Status: DC
  Administered 2024-03-29 (×3): 2 [IU] via SUBCUTANEOUS

## 2024-03-29 NOTE — Progress Notes (Signed)
 PT Cancellation Note  Patient Details Name: Toni [name] MRN: [id number] DOB: 1933-03-04   Cancelled Treatment:    Reason Eval/Treat Not Completed: Patient not medically ready;Fatigue/lethargy limiting ability to participate  Attempted evaluation with OT. Pt just put on BiPap; Nsg reports pt only opens eyes to name and withdraws from pain, otherwise not responsive. Will follow up at a later time.  Leontine Roads, PT, DPT [facility]  Rehabilitation Services Physical Therapist Office: [phone] Website: [email or web address]   Leontine GORMAN Roads 03/29/2024, 1:53 PM

## 2024-03-29 NOTE — Plan of Care (Signed)
  Problem: Education: Goal: Ability to describe self-care measures that may prevent or decrease complications (Diabetes Survival Skills Education) will improve Outcome: Not Progressing Goal: Individualized Educational Video(s) Outcome: Not Progressing   Problem: Coping: Goal: Ability to adjust to condition or change in health will improve Outcome: Not Progressing   Problem: Fluid Volume: Goal: Ability to maintain a balanced intake and output will improve Outcome: Progressing   Problem: Health Behavior/Discharge Planning: Goal: Ability to identify and utilize available resources and services will improve Outcome: Not Progressing Goal: Ability to manage health-related needs will improve Outcome: Not Progressing   Problem: Metabolic: Goal: Ability to maintain appropriate glucose levels will improve Outcome: Progressing   Problem: Nutritional: Goal: Maintenance of adequate nutrition will improve Outcome: Progressing Goal: Progress toward achieving an optimal weight will improve Outcome: Not Progressing   Problem: Skin Integrity: Goal: Risk for impaired skin integrity will decrease Outcome: Not Progressing   Problem: Tissue Perfusion: Goal: Adequacy of tissue perfusion will improve Outcome: Not Progressing   Problem: Education: Goal: Knowledge of General Education information will improve Description: Including pain rating scale, medication(s)/side effects and non-pharmacologic comfort measures Outcome: Not Progressing   Problem: Health Behavior/Discharge Planning: Goal: Ability to manage health-related needs will improve Outcome: Not Progressing   Problem: Clinical Measurements: Goal: Ability to maintain clinical measurements within normal limits will improve Outcome: Not Progressing Goal: Will remain free from infection Outcome: Not Progressing Goal: Diagnostic test results will improve Outcome: Not Progressing Goal: Respiratory complications will improve Outcome:  Not Progressing Goal: Cardiovascular complication will be avoided Outcome: Not Progressing   Problem: Nutrition: Goal: Adequate nutrition will be maintained Outcome: Not Progressing   Problem: Activity: Goal: Risk for activity intolerance will decrease Outcome: Not Progressing   Problem: Coping: Goal: Level of anxiety will decrease Outcome: Not Progressing   Problem: Elimination: Goal: Will not experience complications related to bowel motility Outcome: Progressing Goal: Will not experience complications related to urinary retention Outcome: Progressing   Problem: Pain Managment: Goal: General experience of comfort will improve and/or be controlled Outcome: Progressing   Problem: Safety: Goal: Ability to remain free from injury will improve Outcome: Not Progressing   Problem: Skin Integrity: Goal: Risk for impaired skin integrity will decrease Outcome: Not Progressing

## 2024-03-29 NOTE — Consult Note (Signed)
 Palliative Care Consult Note                                  Date: 03/29/2024   Patient Name: Toni [name]  DOB: May 22, 1933  MRN: [id number]  Age / Sex: 89 y.o., female  PCP: Patient, No Pcp Per Referring Physician: Gretta Leita SQUIBB, DO  Reason for Consultation: Establishing goals of care  HPI/Patient Profile: 89 y.o. female  with past medical history of chronic HFpEF, diabetes type 2, peripheral artery disease, cirrhosis, and HTN who presented to the ED on 03/27/2024 with nausea/vomiting, lethargy, and weakness.  She is admitted with sepsis secondary to UTI and colitis, AKI, lactic acidosis, metabolic acidosis, acute metabolic encephalopathy, and new onset A-fib.  Palliative Medicine has been consulted for goals of care discussions. Patient and family are faced with anticipatory care needs and complex medical decision making.   Clinical Assessment and Goals of Care:   Extensive chart review has been completed including labs, vital signs, imaging, progress/consult notes, orders, medications and available advance directive documents.  Patient assessed and update received from RN. Patient placed on BiPAP earlier today. She is minimally responsive. She is off vasopressor support since yesterday.   I met with daughter/[name] at bedside to discuss diagnosis, prognosis, GOC, and options.  I introduced Palliative Medicine as specialized medical care for people living with serious illness. It focuses on providing relief from the symptoms and stress of a serious illness.   Created space and opportunity for daughter to express thoughts and feelings regarding current medical situation. Values and goals of care were attempted to be elicited.  Life Review: Patient is originally from [place], but also spent time living in [place] . 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 She is widowed. Daughter/[name] is her only living child, as her son  Adelina twin brother) is deceased.   Functional Status: Patient suffered a left femur fracture in September 2023. She has been non-ambulatory/bed-bound since then. She lives at home with daughter.   Discussion: We discussed patient's current illness and what it means in the larger context of her ongoing co-morbidities. Current clinical status was reviewed.   We reviewed that patient is hospitalized with multiple issues including sepsis secondary to UTI and colitis, shock (resolved), AKI, encephalopathy. Discussed concern that her mental status has not improved despite treatment of sepsis.   Discussed that patient is high risk to decompensate secondary to her multiple comorbidities.   Discussed code status. After speaking with patient's brother (by phone), daughter agrees to DNR/DNI status. Reviewed evidence-based poor outcomes in similar hospitalized patients, as the cause of cardiac arrest would likely be associated with advanced illness rather than a reversible condition.  For now, daughter wishes to continue supportive care while hoping for improvement. She is open to ongoing GOC conversation and will make additional decisions pending patient's clinical course.   Discussed the importance of continued conversation with the medical team regarding overall plan of care and treatment options. Questions and concerns addressed. Emotional support provided.   Review of Systems  Unable to perform ROS   Objective:   Primary Diagnoses: Present on Admission:  Septic shock [facility])   Physical Exam Constitutional:      General: She is not in acute distress.    Appearance: She is ill-appearing.  Cardiovascular:     Rate and Rhythm: Normal rate.  Pulmonary:     Comments: BiPAP Neurological:  Comments: Not following commands    Vital Signs:  BP 119/67   Pulse 84   Temp 97.9 F (36.6 C) (Oral)   Resp (!) 24   Ht 5' 2 (1.575 m)   Wt 92.4 kg   SpO2 100%   BMI 37.26 kg/m    Palliative Assessment/Data: PPS 20-30%     Assessment & Plan:   SUMMARY OF RECOMMENDATIONS   Code status changed to DNR with limited interventions Continue current supportive interventions Ongoing palliative support  Primary Decision Maker: NEXT OF KIN - daughter/[name]  Existing Vynca/ACP Documentation: None  Prognosis:  guarded  Discharge Planning:  To Be Determined    Thank you for allowing us  to participate in the care of SHANTANIQUE HODO   Time Total: 76 minutes  Detailed review of medical records (labs, imaging, vital signs), medically appropriate exam, discussed with treatment team, counseling and education to patient, family, & staff, documenting clinical information, coordination of care.   Signed by: Recardo Loll, NP Palliative Medicine Team  Team Phone # [phone]  For individual providers, please see AMION

## 2024-03-29 NOTE — Progress Notes (Signed)
 Pt minimally responsive throughout shift and not following commands.  Around 1730 patient more alert, stated full name, squeezed hands BL, and stuck out tongue.  Per CCM MD if pt becomes more alert over night and requesting BiPAP off we can give pt break from BiPAP.  Will continue to monitor pts neuro status closely.

## 2024-03-29 NOTE — Progress Notes (Signed)
 NAME:  Toni [name], MRN:  [id number], DOB:  [birth year], LOS: 1 ADMISSION DATE:  03/27/2024, CONSULTATION DATE:  03/28/24 REFERRING MD:  EDP, CHIEF COMPLAINT:  hypotension   History of Present Illness:  89 yo female presented via EMS after reported coffee ground emesis. She was noted to have BP's in the 50's-70's, given ivf with transient improvement. Per report pt was increasingly weak and lethargic today developed n/v. She is typically alert but was progressively confused over the course of the day as well. All history is obtained from chart 2/2 pt's acute encephalopathy. Pt has not had a bowel movement for 5 days and has been suffering from abdominal distention and pain when today she began having nausea with emesis.   Pt reportedly has frequent uti's in past. Ua pending. Lactate elevated on presentation but improving to 3 with volume. Ultimately, she required initiation of norepi to maintain BP. Found to be in aki with Cr 1.5 (baseline 0.7) with some hydronephrosis 2/2 R staghorn stone. There is also stigmata of espophagitis and colitis.   Pertinent  Medical History  HFpEF T2dm with hyperglycemia Htn Obesity cirrhosis  Significant Hospital Events: Including procedures, antibiotic start and stop dates in addition to other pertinent events   Admitted to icu 9/10 on norepi 9/11 more altered this AM, but off pressors   Interim History / Subjective:  No overnight events, off norepi and lactic acid has cleared  AKI improved  Mental status is worse today  Objective    Blood pressure (!) 114/57, pulse 88, temperature 97.8 F (36.6 C), temperature source Axillary, resp. rate (!) 22, height 5' 2 (1.575 m), weight 92.4 kg, SpO2 99%.        Intake/Output Summary (Last 24 hours) at 03/29/2024 1043 Last data filed at 03/29/2024 1000 Gross per 24 hour  Intake 3237.18 ml  Output 917 ml  Net 2320.18 ml   Filed Weights   03/28/24 0322 03/29/24 0500  Weight: 89.6 kg 92.4 kg      General:  well nourished, ill-appearing elderly F resting in bed  HEENT: MM pink/moist, sclera anicteric Neuro: sleeping, wakes up but not following commands and falls back asleep  CV: s1s2 rrr, no m/r/g PULM:  clear bilaterally on RA without distress GI: soft, obese, mild diffuse TTP Extremities: warm/dry, no edema, no skin mottling   UOP 817cc yesterday 0.4cc/kg  WBC 14 Lactic 1.9 K 3.4 Creatinine 0.84 Glu 171 UC growing gm - rods  Resolved problem list   Assessment and Plan   Sepsis multi source, uti and colitis Shock 2/2 above, lactic acidosis, metabolic acidosis with acute metabolic encephalopathy HFpEF  Cirrhosis with elevated ammonia Atrial Fibrillation-new onset Gm neg rods in urine, speciation pending, BC with gm + rods likely contaminant  -sepsis physiology is improving with cleared lactic acid and no pressor requirement  -de-escalate to Zosyn   -concerning her mental status is worse today, continue lactulose  and check ABG -echo with EF continue amiodarone , repeat TSH WNL, T4 1.2, Xarelto  not filled for years and per pharmacy was for DVT prophy post-op -lactulose  for ammonia and constipation, digital disimpaction moderately successful yesterday  -has NGT -while sepsis is improving, I spoke with daughter and pt is bed-bound after her last surgery and is not able to care for herself, if her condition were to worsen full code would not likely be helpful, will further discuss today and add palliative consult      Emesis, +/-hematemesis  Constipation Esophagitis Inguinal Hernia -no further emesis or  evidence of UGIB, continue PPI, with distal esophageal thickening will need outpatient GI follow up  -lactulose  and miralax , having liquid stool around stool ball, try smog  -no incarceration of hernia on CT, no current indication for surgical consult    Aki with hydronephrosis Staghorn nephrolithiasis Hypomagnesemia Creatinine improving -renal US  without  hydro -per urology large stone that will likely need outpatient percutaneous nephrolithotomy  -trend and replete electrolytes prn -maintain adequate renal perfusion and monitor renal indices   Type 2 DM -SSI with TF coverage and lantus  increased from 10u to 20u   Labs   CBC: Recent Labs  Lab 03/27/24 2007 03/27/24 2023 03/28/24 0248 03/29/24 0925  WBC 14.3*  --  11.7* 14.0*  NEUTROABS 10.8*  --   --   --   HGB 14.9 16.0*  16.7* 11.7* 10.4*  HCT 48.5* 47.0*  49.0* 36.9 33.5*  MCV 88.8  --  87.2 89.3  PLT 398  --  263 166    Basic Metabolic Panel: Recent Labs  Lab 03/27/24 2007 03/27/24 2023 03/28/24 0248 03/28/24 0759 03/28/24 1001 03/29/24 0323  NA 129* 127*  128* 132* 137  --  137  K 5.6* 5.4*  5.4* 3.9 3.8  --  3.4*  CL 92* 99 99 97*  --  98  CO2 15*  --  21* 23  --  24  GLUCOSE 317* 328* 260* 223*  --  171*  BUN 43* 45* 40* 42*  --  40*  CREATININE 1.57* 1.40* 1.22* 1.01*  --  0.84  CALCIUM 9.4  --  8.2* 8.1*  --  8.3*  MG  --   --  1.5* 2.5*  --  2.7*  PHOS  --   --   --   --  2.4* 3.2   GFR: Estimated Creatinine Clearance: 48 mL/min (by C-G formula based on SCr of 0.84 mg/dL). Recent Labs  Lab 03/27/24 2007 03/27/24 2024 03/28/24 0248 03/28/24 1001 03/28/24 1334 03/28/24 1640 03/29/24 0925  WBC 14.3*  --  11.7*  --   --   --  14.0*  LATICACIDVEN  --    < >  --  5.1* 3.3* 4.4* 1.9   < > = values in this interval not displayed.    Liver Function Tests: Recent Labs  Lab 03/27/24 2007 03/28/24 1001  AST 30 32  ALT 13 14  ALKPHOS 85 50  BILITOT 0.7 0.9  PROT 7.3 4.9*  ALBUMIN  2.6* 1.6*   Recent Labs  Lab 03/27/24 2248  LIPASE <10*   Recent Labs  Lab 03/27/24 2056  AMMONIA 51*    ABG    Component Value Date/Time   PHART 7.401 08/31/2016 2036   PCO2ART 42.9 08/31/2016 2036   PO2ART 167.0 (H) 08/31/2016 2036   HCO3 18.5 (L) 03/27/2024 2023   TCO2 17 (L) 03/27/2024 2023   TCO2 19 (L) 03/27/2024 2023   ACIDBASEDEF 4.0 (H)  03/27/2024 2023   O2SAT 95 03/27/2024 2023     Coagulation Profile: No results for input(s): INR, PROTIME in the last 168 hours.  Cardiac Enzymes: No results for input(s): CKTOTAL, CKMB, CKMBINDEX, TROPONINI in the last 168 hours.  HbA1C: Hemoglobin A1C  Date/Time Value Ref Range Status  09/20/2016 12:00 AM 7.2  Final   Hgb A1c MFr Bld  Date/Time Value Ref Range Status  03/28/2024 02:48 AM 7.2 (H) 4.8 - 5.6 % Final    Comment:    (NOTE) Diagnosis of Diabetes The following HbA1c ranges recommended by  the American Diabetes Association (ADA) may be used as an aid in the diagnosis of diabetes mellitus.  Hemoglobin             Suggested A1C NGSP%              Diagnosis  <5.7                   Non Diabetic  5.7-6.4                Pre-Diabetic  >6.4                   Diabetic  <7.0                   Glycemic control for                       adults with diabetes.    03/24/2022 06:28 PM 7.8 (H) 4.8 - 5.6 % Final    Comment:    (NOTE) Pre diabetes:          5.7%-6.4%  Diabetes:              >6.4%  Glycemic control for   <7.0% adults with diabetes     CBG: Recent Labs  Lab 03/28/24 1627 03/28/24 2005 03/28/24 2315 03/29/24 0322 03/29/24 0730  GLUCAP 180* 169* 197* 166* 150*    Review of Systems:   As per HPI  Past Medical History:  She,  has a past medical history of Diabetes mellitus without complication (HCC), Hypertension, Peripheral vascular disease (HCC), and Plantar fascial fibromatosis.   Surgical History:   Past Surgical History:  Procedure Laterality Date   ABDOMINAL HYSTERECTOMY     partial   ECTOPIC PREGNANCY SURGERY     JOINT REPLACEMENT Bilateral    TKR   ORIF FEMUR FRACTURE Left 03/25/2022   Procedure: OPEN REDUCTION INTERNAL FIXATION LEFT FEMUR;  Surgeon: Celena Sharper, MD;  Location: [facility] OR;  Service: Orthopedics;  Laterality: Left;   TEE WITHOUT CARDIOVERSION N/A 09/07/2016   Procedure: TRANSESOPHAGEAL ECHOCARDIOGRAM  (TEE);  Surgeon: Annabella Scarce, MD;  Location[facility] ENDOSCOPY;  Service: Cardiovascular;  Laterality: N/A;     Social History:   reports that she has never smoked. She has never used smokeless tobacco. She reports that she does not drink alcohol and does not use drugs.   Family History:  Her family history includes Diabetes in her sister; Heart disease in her father; Hyperlipidemia in her father and mother; Hypertension in her father and mother; Other in her sister.   Allergies No Known Allergies   Home Medications  Prior to Admission medications   Medication Sig Start Date End Date Taking? Authorizing Provider  acetaminophen  (TYLENOL ) 500 MG tablet Take 500 mg by mouth daily.    [provider]  albuterol  (PROVENTIL ) (2.5 MG/3ML) 0.083% nebulizer solution Take 3 mLs (2.5 mg total) by nebulization every 4 (four) hours as needed for wheezing or shortness of breath. 09/09/16   Levern Beckey HERO, MD  aspirin  81 MG chewable tablet Chew 81 mg by mouth every evening.    [provider]  bisacodyl  (DULCOLAX) 5 MG EC tablet Take 1 tablet (5 mg total) by mouth daily as needed for moderate constipation. 03/29/22   Sherrill Cable Latif, DO  carvedilol  (COREG ) 3.125 MG tablet Take 1 tablet (3.125 mg total) by mouth 2 (two) times daily with a meal. 03/29/22   Sherrill Cable Donovan, DO  cholecalciferol  (CHOLECALCIFEROL ) 25 MCG tablet Take 2 tablets (2,000 Units total) by mouth 2 (two) times daily. 03/29/22   Sheikh, Omair Latif, DO  feeding supplement (ENSURE ENLIVE / ENSURE PLUS) LIQD Take 237 mLs by mouth 2 (two) times daily between meals. Patient not taking: Reported on 05/24/2022 03/29/22   Sherrill Cable Latif, DO  furosemide  (LASIX ) 40 MG tablet Take 40 mg by mouth daily.    [provider]  HYDROcodone -acetaminophen  (NORCO) 5-325 MG tablet Take 1-2 tablets by mouth every 6 (six) hours as needed for severe pain. Patient not taking: Reported on 05/24/2022 03/28/22   McBane, Caroline N,  PA-C  Menthol, Topical Analgesic, (BIOFREEZE) 4 % GEL Apply 1 Application topically 2 (two) times daily.    [provider]  Multiple Vitamins-Minerals (CENTRUM SILVER PO) Take 1 tablet by mouth daily.     [provider]  Nutritional Supplements (NUTRITIONAL SUPPLEMENT PO) Take 237 mLs by mouth 2 (two) times daily.    [provider]  ondansetron  (ZOFRAN ) 4 MG tablet Take 1 tablet (4 mg total) by mouth every 6 (six) hours as needed for nausea. 03/29/22   Sheikh, Omair Latif, DO  Polyethyl Glycol-Propyl Glycol (SYSTANE ULTRA) 0.4-0.3 % SOLN Place 2 drops into both eyes 2 (two) times daily as needed (for dry eyes).    [provider]  Potassium Chloride  ER 20 MEQ TBCR Take 20 mEq by mouth daily.    [provider]  senna-docusate (SENOKOT-S) 8.6-50 MG tablet Take 1 tablet by mouth at bedtime as needed for mild constipation or moderate constipation. 05/24/22   Long, Joshua G, MD  simvastatin  (ZOCOR ) 40 MG tablet Take 40 mg by mouth daily.     [provider]  Vitamin D , Ergocalciferol , (DRISDOL ) 1.25 MG (50000 UNIT) CAPS capsule Take 1 capsule (50,000 Units total) by mouth every 7 (seven) days. Patient not taking: Reported on 05/24/2022 04/02/22   Sheikh, Omair Latif, DO  XARELTO  10 MG TABS tablet Take 10 mg by mouth daily. 04/27/22   [provider]     Critical care time: 35 mins critical care time     CRITICAL CARE Performed by: Leita SAUNDERS Michole Lecuyer   Total critical care time: 35 minutes  Critical care time was exclusive of separately billable procedures and treating other patients.  Critical care was necessary to treat or prevent imminent or life-threatening deterioration.  Critical care was time spent personally by me on the following activities: development of treatment plan with patient and/or surrogate as well as nursing, discussions with consultants, evaluation of patient's response to treatment, examination of patient,  obtaining history from patient or surrogate, ordering and performing treatments and interventions, ordering and review of laboratory studies, ordering and review of radiographic studies, pulse oximetry and re-evaluation of patient's condition.      Leita SAUNDERS Shristi Scheib, PA-C [facility] See Amion for pager If no response to pager , please call [phone] until 7pm After 7:00 pm call Elink  [phone]

## 2024-03-29 NOTE — Progress Notes (Signed)
 RT placed patient on bipap per MD order. Patient tolerating well at this time.

## 2024-03-29 NOTE — Progress Notes (Deleted)
 eLink Physician-Brief Progress Note Patient Name: Toni [name] DOB: Jan 04, 1933 MRN: [id number]   Date of Service  03/29/2024  HPI/Events of Note  Patient complaining of pain.  eICU Interventions  Tylenol  1 gm iv ordered for pain.        Mariellen Blaney U Kylah Maresh 03/29/2024, 4:19 AM

## 2024-03-29 NOTE — Progress Notes (Signed)
 PHARMACY - PHYSICIAN COMMUNICATION CRITICAL VALUE ALERT - BLOOD CULTURE IDENTIFICATION (BCID)  THARA SEARING is an 89 y.o. female who presented to [facility] on 03/[facility]24 with a chief complaint of coffee-ground emesis, hypotension, weakness, N/V, and worsening confusion.  Assessment:  1/4 blood cultures with gram positive rods, Ucx with gram negative rods  -WBC 14 >11, sCr 0.84, afebrile >24 hours  Name of physician (or Provider) Contacted: Dr. Epimenio   Current antibiotics: Zosyn  3.375gm IV every 8 hours + Vancomycin  1000mg  IV every 48 hours  Changes to prescribed antibiotics recommended:   -De-escalate to Zosyn  3.375gm IV every 8 hours  Lynwood Poplar, PharmD, BCPS Clinical Pharmacist 03/29/2024 6:39 AM

## 2024-03-29 NOTE — Progress Notes (Signed)
 [facility] ADULT ICU REPLACEMENT PROTOCOL   The patient does apply for the [facility] Adult ICU Electrolyte Replacment Protocol based on the criteria listed below:   1.Exclusion criteria: TCTS, ECMO, Dialysis, and Myasthenia Gravis patients 2. Is GFR >/= 30 ml/min? Yes.    Patient's GFR today is >60 3. Is SCr </= 2? Yes.   Patient's SCr is 0.84 mg/dL 4. Did SCr increase >/= 0.5 in 24 hours? No. 5.Pt's weight >40kg  Yes.   6. Abnormal electrolyte(s):  K 3.4  7. Electrolytes replaced per protocol 8.  Call MD STAT for K+ </= 2.5, Phos </= 1, or Mag </= 1 Physician:  CLEMENTEEN Epimenio Blackbird R Ellyanna Holton 03/29/2024 5:20 AM

## 2024-03-29 NOTE — Progress Notes (Signed)
 OT Cancellation Note  Patient Details Name: Toni [name] MRN: [id number] DOB: 1932/09/23   Cancelled Treatment:    Reason Eval/Treat Not Completed: Patient not medically ready (Pt just put on BiPap; Nsg reports pt only opens eyes to name and withdraws from pain, otherwise not responsive. Will follow up at a later time.)  [facility] 03/29/2024, 1:44 PM Kreg Sink, OT/L   Acute OT Clinical Specialist Acute Rehabilitation Services Pager [phone] Office [phone]

## 2024-03-29 NOTE — Plan of Care (Signed)
  Interdisciplinary Goals of Care Family Meeting   Date carried out:: 03/29/2024  Location of the meeting: Bedside  Member's involved: Bedside Registered Nurse, Family Member or next of kin, and Other: PA  Durable Power of Attorney or acting medical decision maker: Pt's daughter   [name]  Discussion: We discussed goals of care for [employer] .  I met with patient's daughter [name] and we discussed that though pt's septic shock picture is improving, her overall prognosis is poor as she has been bed-bound for some time and she is becoming more somnolent and hypercarbic.  Pt has a prior DNR and has discussed that she did not want life support, after discussion with family [name] has decided to change code status to DNR.  We will continue all current support in hopes that Bipap overnight might be enough to support her through, but would not pursue intubation  Code status: Full DNR  Disposition: Continue current acute care   Time spent for the meeting: 15 minutes  Leita SAUNDERS Charlie Seda 03/29/2024, 5:21 PM

## 2024-03-30 DIAGNOSIS — J9601 Acute respiratory failure with hypoxia: Secondary | ICD-10-CM | POA: Diagnosis not present

## 2024-03-30 DIAGNOSIS — G9341 Metabolic encephalopathy: Secondary | ICD-10-CM | POA: Diagnosis not present

## 2024-03-30 DIAGNOSIS — A419 Sepsis, unspecified organism: Secondary | ICD-10-CM | POA: Diagnosis not present

## 2024-03-30 DIAGNOSIS — R6521 Severe sepsis with septic shock: Secondary | ICD-10-CM | POA: Diagnosis not present

## 2024-03-30 DIAGNOSIS — J9602 Acute respiratory failure with hypercapnia: Secondary | ICD-10-CM

## 2024-03-30 DIAGNOSIS — Z515 Encounter for palliative care: Secondary | ICD-10-CM | POA: Diagnosis not present

## 2024-03-30 DIAGNOSIS — E1165 Type 2 diabetes mellitus with hyperglycemia: Secondary | ICD-10-CM

## 2024-03-30 DIAGNOSIS — Z7189 Other specified counseling: Secondary | ICD-10-CM | POA: Diagnosis not present

## 2024-03-30 LAB — BASIC METABOLIC PANEL WITH GFR
Anion gap: 9 (ref 5–15)
BUN: 31 mg/dL — ABNORMAL HIGH (ref 8–23)
CO2: 25 mmol/L (ref 22–32)
Calcium: 8.5 mg/dL — ABNORMAL LOW (ref 8.9–10.3)
Chloride: 107 mmol/L (ref 98–111)
Creatinine, Ser: 0.5 mg/dL (ref 0.44–1.00)
GFR, Estimated: >60 mL/min (ref >60)
Glucose, Bld: 66 mg/dL — ABNORMAL LOW (ref 70–99)
Potassium: 3.8 mmol/L (ref 3.5–5.1)
Sodium: 141 mmol/L (ref 135–145)

## 2024-03-30 LAB — POCT I-STAT 7, (LYTES, BLD GAS, ICA,H+H)
Acid-Base Excess: 1 mmol/L (ref 0.0–2.0)
Bicarbonate: 26.8 mmol/L (ref 20.0–28.0)
Calcium, Ion: 1.24 mmol/L (ref 1.15–1.40)
HCT: 31 % — ABNORMAL LOW (ref 36.0–46.0)
Hemoglobin: 10.5 g/dL — ABNORMAL LOW (ref 12.0–15.0)
O2 Saturation: 94 %
Patient temperature: 36.7
Potassium: 3.4 mmol/L — ABNORMAL LOW (ref 3.5–5.1)
Sodium: 140 mmol/L (ref 135–145)
TCO2: 28 mmol/L (ref 22–32)
pCO2 arterial: 45.6 mmHg (ref 32–48)
pH, Arterial: 7.376 (ref 7.35–7.45)
pO2, Arterial: 71 mmHg — ABNORMAL LOW (ref 83–108)

## 2024-03-30 LAB — CBC
HCT: 34.6 % — ABNORMAL LOW (ref 36.0–46.0)
Hemoglobin: 10.8 g/dL — ABNORMAL LOW (ref 12.0–15.0)
MCH: 27.3 pg (ref 26.0–34.0)
MCHC: 31.2 g/dL (ref 30.0–36.0)
MCV: 87.6 fL (ref 80.0–100.0)
Platelets: 199 K/uL (ref 150–400)
RBC: 3.95 MIL/uL (ref 3.87–5.11)
RDW: 13.2 % (ref 11.5–15.5)
WBC: 13.7 K/uL — ABNORMAL HIGH (ref 4.0–10.5)
nRBC: 0.4 % — ABNORMAL HIGH (ref 0.0–0.2)

## 2024-03-30 LAB — BLOOD GAS, VENOUS
Acid-Base Excess: 6.8 mmol/L — ABNORMAL HIGH (ref 0.0–2.0)
Bicarbonate: 33.7 mmol/L — ABNORMAL HIGH (ref 20.0–28.0)
O2 Saturation: 97.9 %
Patient temperature: 36.4
pCO2, Ven: 56 mmHg (ref 44–60)
pH, Ven: 7.39 (ref 7.25–7.43)
pO2, Ven: 71 mmHg — ABNORMAL HIGH (ref 32–45)

## 2024-03-30 LAB — GLUCOSE, CAPILLARY
Glucose-Capillary: 113 mg/dL — ABNORMAL HIGH (ref 70–99)
Glucose-Capillary: 123 mg/dL — ABNORMAL HIGH (ref 70–99)
Glucose-Capillary: 151 mg/dL — ABNORMAL HIGH (ref 70–99)
Glucose-Capillary: 60 mg/dL — ABNORMAL LOW (ref 70–99)
Glucose-Capillary: 75 mg/dL (ref 70–99)
Glucose-Capillary: 76 mg/dL (ref 70–99)
Glucose-Capillary: 80 mg/dL (ref 70–99)
Glucose-Capillary: 90 mg/dL (ref 70–99)

## 2024-03-30 LAB — PHOSPHORUS: Phosphorus: 1.5 mg/dL — ABNORMAL LOW (ref 2.5–4.6)

## 2024-03-30 LAB — MAGNESIUM: Magnesium: 2.6 mg/dL — ABNORMAL HIGH (ref 1.7–2.4)

## 2024-03-30 MED ORDER — SODIUM CHLORIDE 0.9% FLUSH: 10.0000 mL | INTRAVENOUS | Status: DC | PRN

## 2024-03-30 MED ORDER — SMOG ENEMA
960.0000 mL | Freq: Once | RECTAL | Status: AC
Start: 2024-03-30 — End: 2024-03-30
  Administered 2024-03-30: 960 mL via RECTAL
  Filled 2024-03-30: qty 960

## 2024-03-30 MED ORDER — LACTULOSE 10 GM/15ML PO SOLN
30.0000 g | Freq: Three times a day (TID) | ORAL | Status: DC
  Administered 2024-03-30 – 2024-03-31 (×3): 30 g via ORAL
  Filled 2024-03-30 (×3): qty 45

## 2024-03-30 MED ORDER — DEXTROSE 50 % IV SOLN
12.5000 g | INTRAVENOUS | Status: AC
  Administered 2024-03-30: 12.5 g via INTRAVENOUS
  Filled 2024-03-30: qty 50

## 2024-03-30 MED ORDER — SORBITOL 70 % SOLN
30.0000 mL | Freq: Once | Status: AC
  Administered 2024-03-30: 30 mL via ORAL
  Filled 2024-03-30: qty 30

## 2024-03-30 MED ORDER — POTASSIUM PHOSPHATES 15 MMOLE/5ML IV SOLN
45.0000 mmol | Freq: Once | INTRAVENOUS | Status: AC
  Administered 2024-03-30: 45 mmol via INTRAVENOUS
  Filled 2024-03-30: qty 15

## 2024-03-30 MED ORDER — MIDODRINE HCL 5 MG PO TABS
5.0000 mg | ORAL_TABLET | Freq: Three times a day (TID) | ORAL | Status: DC
  Administered 2024-03-30 – 2024-03-31 (×3): 5 mg via ORAL
  Filled 2024-03-30 (×4): qty 1

## 2024-03-30 MED ORDER — METOCLOPRAMIDE HCL 5 MG/ML IJ SOLN
10.0000 mg | Freq: Three times a day (TID) | INTRAMUSCULAR | Status: DC
  Administered 2024-03-30 – 2024-04-17 (×54): 10 mg via INTRAVENOUS
  Filled 2024-03-30 (×54): qty 2

## 2024-03-30 NOTE — Progress Notes (Signed)
 NAME:  Toni [name], MRN:  [id number], DOB:  04/06/1933, LOS: 2 ADMISSION DATE:  03/27/2024, CONSULTATION DATE:  03/28/24 REFERRING MD:  EDP, CHIEF COMPLAINT:  hypotension   History of Present Illness:  89 yo female presented via EMS after reported coffee ground emesis. She was noted to have BP's in the 50's-70's, given ivf with transient improvement. Per report pt was increasingly weak and lethargic today developed n/v. She is typically alert but was progressively confused over the course of the day as well. All history is obtained from chart 2/2 pt's acute encephalopathy. Pt has not had a bowel movement for 5 days and has been suffering from abdominal distention and pain when today she began having nausea with emesis.   Pt reportedly has frequent uti's in past. Ua pending. Lactate elevated on presentation but improving to 3 with volume. Ultimately, she required initiation of norepi to maintain BP. Found to be in aki with Cr 1.5 (baseline 0.7) with some hydronephrosis 2/2 R staghorn stone. There is also stigmata of espophagitis and colitis.   Pertinent  Medical History  HFpEF T2dm with hyperglycemia Htn Obesity cirrhosis  Significant Hospital Events: Including procedures, antibiotic start and stop dates in addition to other pertinent events   Admitted to icu 9/10 on norepi 9/11 more altered this AM, but off pressors; IPAL yesterday and PMT seen - decision to make DNR-L 9/12: pending ongoing GOC discussion   Interim History / Subjective:  Overnight had vomiting x2, TF were discontinued. She then had hypoglycemic episode. DNR-L yesterday. She wore BiPAP yesterday/last night which minimally improved her mental status. Able to tell me her name and that is all. Lethargic.   Objective    Blood pressure 126/73, pulse 82, temperature 98.4 F (36.9 C), temperature source Oral, resp. rate (!) 31, height 5' 2 (1.575 m), weight 92.4 kg, SpO2 93%.    FiO2 (%):  [30 %] 30 % PEEP:  [5 cmH20] 5  cmH20 Pressure Support:  [12 cmH20] 12 cmH20   Intake/Output Summary (Last 24 hours) at 03/30/2024 1227 Last data filed at 03/30/2024 0700 Gross per 24 hour  Intake 1353.85 ml  Output 855 ml  Net 498.85 ml   Filed Weights   03/28/24 0322 03/29/24 0500  Weight: 89.6 kg 92.4 kg     General:  well nourished, ill-appearing elderly F resting in bed  HEENT: MM dry, sclera anicteric Neuro: lethargic. Wakes to voice and mouths her name to me, otherwise does not answer questions, does not follow commands  CV: s1s2 rrr, no m/r/g PULM:  clear bilaterally on RA without distress GI: rounded, soft, obese, mild diffuse TTP Extremities: warm/dry, no edema, no skin mottling   Resolved problem list  Shock  Assessment and Plan  Sepsis 2/2 UTI and colitis  Gm neg rods in urine, speciation pending, BC with gm + rods likely contaminant  -sepsis physiology is improving with cleared lactic acid and no pressor requirement  -complete zosyn  for 7 days total  - titrate midodrine  down to 5mg  TID as her BP is normal/borderline high  -trend wbc, fever, lactic curve  - repeat cultures if worsening   HFpEF  Echo on 9/10 demonstrating EF 70-75%, hyperdynamic, Severe MR calcifications - cardiac monitoring   Atrial fibrillation, new Likely in the setting of septic shock. TSH was within normal limits. T4 1.2. 9/12 Now in sinus rhythm  - stop amiodarone  today, will not transition to oral  - cardiac monitoring  - can restart amio if she flips  back   Cirrhosis Seen on CT AP on admission.  - elevated ammonia treated with lactulose   - clinically monitor   Constipation  Emesis  Esophagitis Inguinal Hernia Plan film with very large stool ball. She was digitally disimpacted; however, stool ball is higher up. Had smog enemia with minimal output. Have tried lactulose .  - repeat smog enema today  - add on sorbital PO  - add on reglan  IV  -no incarceration of hernia on CT, no current indication for surgical  consult   Acute kidney injury with hydronephrosis, improving  Staghorn nephrolithiasis Hypophosphatemia  renal US  without hydro -per urology large stone that will likely need outpatient percutaneous nephrolithotomy  -trend and replete electrolytes prn -maintain adequate renal perfusion and monitor renal indices  -replete phos and trend  Type 2 DM - ssi  - cbg q4h  - hold lantus  while not on TF   GOC: made DNR-L yesterday after discussion with CCM and PMT teams and daughter. She is bed bound at baseline now with worsening mental status and critical illness. See respective IPAL and PMT notes. Will optimize her but needs ongoing discussion regarding likely EOL care Labs   CBC: Recent Labs  Lab 03/27/24 2007 03/27/24 2023 03/28/24 0248 03/29/24 0925 03/29/24 1055 03/29/24 1419 03/29/24 1434 03/29/24 1557 03/30/24 0231 03/30/24 0413  WBC 14.3*  --  11.7* 14.0*  --   --  11.6*  --  13.7*  --   NEUTROABS 10.8*  --   --   --   --   --   --   --   --   --   HGB 14.9   < > 11.7* 10.4*   < > 10.9* 10.7* 10.5* 10.8* 10.5*  HCT 48.5*   < > 36.9 33.5*   < > 32.0* 34.2* 31.0* 34.6* 31.0*  MCV 88.8  --  87.2 89.3  --   --  88.6  --  87.6  --   PLT 398  --  263 166  --   --  160  --  199  --    < > = values in this interval not displayed.    Basic Metabolic Panel: Recent Labs  Lab 03/28/24 0248 03/28/24 0759 03/28/24 1001 03/29/24 0323 03/29/24 1055 03/29/24 1419 03/29/24 1433 03/29/24 1434 03/29/24 1557 03/30/24 0231 03/30/24 0413  NA 132* 137  --  137   < > 141  --  138 139 141 140  K 3.9 3.8  --  3.4*   < > 2.8*  --  3.2* 2.8* 3.8 3.4*  CL 99 97*  --  98  --   --   --  101  --  107  --   CO2 21* 23  --  24  --   --   --  27  --  25  --   GLUCOSE 260* 223*  --  171*  --   --   --  177*  --  66*  --   BUN 40* 42*  --  40*  --   --   --  35*  --  31*  --   CREATININE 1.22* 1.01*  --  0.84  --   --   --  0.68  --  0.50  --   CALCIUM 8.2* 8.1*  --  8.3*  --   --   --  8.4*   --  8.5*  --   MG 1.5* 2.5*  --  2.7*  --   --  2.7*  --   --  2.6*  --   PHOS  --   --  2.4* 3.2  --   --   --   --   --  1.5*  --    < > = values in this interval not displayed.   GFR: Estimated Creatinine Clearance: 50.4 mL/min (by C-G formula based on SCr of 0.5 mg/dL). Recent Labs  Lab 03/28/24 0248 03/28/24 1001 03/28/24 1334 03/28/24 1640 03/29/24 0925 03/29/24 1434 03/30/24 0231  WBC 11.7*  --   --   --  14.0* 11.6* 13.7*  LATICACIDVEN  --  5.1* 3.3* 4.4* 1.9  --   --     Liver Function Tests: Recent Labs  Lab 03/27/24 2007 03/28/24 1001 03/29/24 1434  AST 30 32 29  ALT 13 14 19   ALKPHOS 85 50 51  BILITOT 0.7 0.9 0.9  PROT 7.3 4.9* 5.5*  ALBUMIN  2.6* 1.6* 2.8*   Recent Labs  Lab 03/27/24 2248  LIPASE <10*   Recent Labs  Lab 03/27/24 2056  AMMONIA 51*    ABG    Component Value Date/Time   PHART 7.376 03/30/2024 0413   PCO2ART 45.6 03/30/2024 0413   PO2ART 71 (L) 03/30/2024 0413   HCO3 26.8 03/30/2024 0413   TCO2 28 03/30/2024 0413   ACIDBASEDEF 4.0 (H) 03/27/2024 2023   O2SAT 94 03/30/2024 0413     Coagulation Profile: No results for input(s): INR, PROTIME in the last 168 hours.  Cardiac Enzymes: No results for input(s): CKTOTAL, CKMB, CKMBINDEX, TROPONINI in the last 168 hours.  HbA1C: Hemoglobin A1C  Date/Time Value Ref Range Status  09/20/2016 12:00 AM 7.2  Final   Hgb A1c MFr Bld  Date/Time Value Ref Range Status  03/28/2024 02:48 AM 7.2 (H) 4.8 - 5.6 % Final    Comment:    (NOTE) Diagnosis of Diabetes The following HbA1c ranges recommended by the American Diabetes Association (ADA) may be used as an aid in the diagnosis of diabetes mellitus.  Hemoglobin             Suggested A1C NGSP%              Diagnosis  <5.7                   Non Diabetic  5.7-6.4                Pre-Diabetic  >6.4                   Diabetic  <7.0                   Glycemic control for                       adults with diabetes.     03/24/2022 06:28 PM 7.8 (H) 4.8 - 5.6 % Final    Comment:    (NOTE) Pre diabetes:          5.7%-6.4%  Diabetes:              >6.4%  Glycemic control for   <7.0% adults with diabetes     CBG: Recent Labs  Lab 03/30/24 0011 03/30/24 0338 03/30/24 0408 03/30/24 0754 03/30/24 1139  GLUCAP 75 60* 151* 80 76    Review of Systems:   As per HPI  Past Medical History:  She,  has a past medical  history of Diabetes mellitus without complication (HCC), Hypertension, Peripheral vascular disease (HCC), and Plantar fascial fibromatosis.   Surgical History:   Past Surgical History:  Procedure Laterality Date   ABDOMINAL HYSTERECTOMY     partial   ECTOPIC PREGNANCY SURGERY     JOINT REPLACEMENT Bilateral    TKR   ORIF FEMUR FRACTURE Left 03/25/2022   Procedure: OPEN REDUCTION INTERNAL FIXATION LEFT FEMUR;  Surgeon: Celena Sharper, MD;  Location: [facility] OR;  Service: Orthopedics;  Laterality: Left;   TEE WITHOUT CARDIOVERSION N/A 09/07/2016   Procedure: TRANSESOPHAGEAL ECHOCARDIOGRAM (TEE);  Surgeon: Annabella Scarce, MD;  Location: [facility] ENDOSCOPY;  Service: Cardiovascular;  Laterality: N/A;     Social History:   reports that she has never smoked. She has never used smokeless tobacco. She reports that she does not drink alcohol and does not use drugs.   Family History:  Her family history includes Diabetes in her sister; Heart disease in her father; Hyperlipidemia in her father and mother; Hypertension in her father and mother; Other in her sister.   Allergies No Known Allergies   Home Medications  Prior to Admission medications   Medication Sig Start Date End Date Taking? Authorizing Provider  acetaminophen  (TYLENOL ) 500 MG tablet Take 500 mg by mouth daily.    [provider]  albuterol  (PROVENTIL ) (2.5 MG/3ML) 0.083% nebulizer solution Take 3 mLs (2.5 mg total) by nebulization every 4 (four) hours as needed for wheezing or shortness of breath. 09/09/16   Levern Beckey HERO, MD   aspirin  81 MG chewable tablet Chew 81 mg by mouth every evening.    [provider]  bisacodyl  (DULCOLAX) 5 MG EC tablet Take 1 tablet (5 mg total) by mouth daily as needed for moderate constipation. 03/29/22   Sherrill Cable Latif, DO  carvedilol  (COREG ) 3.125 MG tablet Take 1 tablet (3.125 mg total) by mouth 2 (two) times daily with a meal. 03/29/22   Sheikh, Cable Latif, DO  cholecalciferol  (CHOLECALCIFEROL ) 25 MCG tablet Take 2 tablets (2,000 Units total) by mouth 2 (two) times daily. 03/29/22   Sheikh, Omair Latif, DO  feeding supplement (ENSURE ENLIVE / ENSURE PLUS) LIQD Take 237 mLs by mouth 2 (two) times daily between meals. Patient not taking: Reported on 05/24/2022 03/29/22   Sherrill Cable Latif, DO  furosemide  (LASIX ) 40 MG tablet Take 40 mg by mouth daily.    [provider]  HYDROcodone -acetaminophen  (NORCO) 5-325 MG tablet Take 1-2 tablets by mouth every 6 (six) hours as needed for severe pain. Patient not taking: Reported on 05/24/2022 03/28/22   McBane, Caroline N, PA-C  Menthol, Topical Analgesic, (BIOFREEZE) 4 % GEL Apply 1 Application topically 2 (two) times daily.    [provider]  Multiple Vitamins-Minerals (CENTRUM SILVER PO) Take 1 tablet by mouth daily.     [provider]  Nutritional Supplements (NUTRITIONAL SUPPLEMENT PO) Take 237 mLs by mouth 2 (two) times daily.    [provider]  ondansetron  (ZOFRAN ) 4 MG tablet Take 1 tablet (4 mg total) by mouth every 6 (six) hours as needed for nausea. 03/29/22   Sheikh, Omair Latif, DO  Polyethyl Glycol-Propyl Glycol (SYSTANE ULTRA) 0.4-0.3 % SOLN Place 2 drops into both eyes 2 (two) times daily as needed (for dry eyes).    [provider]  Potassium Chloride  ER 20 MEQ TBCR Take 20 mEq by mouth daily.    [provider]  senna-docusate (SENOKOT-S) 8.6-50 MG tablet Take 1 tablet by mouth at bedtime as needed for  mild constipation or moderate constipation. 05/24/22   Long, Fonda MATSU, MD  simvastatin  (ZOCOR ) 40 MG tablet Take 40 mg by mouth daily.     [provider]  Vitamin D , Ergocalciferol , (DRISDOL ) 1.25 MG (50000 UNIT) CAPS capsule Take 1 capsule (50,000 Units total) by mouth every 7 (seven) days. Patient not taking: Reported on 05/24/2022 04/02/22   Sheikh, Omair Latif, DO  XARELTO  10 MG TABS tablet Take 10 mg by mouth daily. 04/27/22   [provider]     Critical care time: 70   Tinnie FORBES Adolph DEVONNA [facility] 03/30/24 12:40 PM  Please see [email or web address] for pager details.  From 7A-7P if no response, please call [phone] After hours, please call ELink [phone]

## 2024-03-30 NOTE — Evaluation (Signed)
 Occupational Therapy Evaluation Patient Details Name: Toni [name] MRN: [id number] DOB: 1932-08-06 Today's Date: 03/30/2024   History of Present Illness   89 y/o woman who presented 9/9 with confusion, poor appetite and PO intake, found to have urosepsis. Acute metabolic encephalopathy. PMH: bedbound after multiple leg surgeries, HTN, DM2     Clinical Impressions Pt per notes is bedbound at baseline. No family present to help communicate baseline ability. Pt reports that she is cared for at home by her mother. Pt at this time with limited bed mobility requires 3 person (A) for peri care with incontinence. Pt noted to have skin break down in peri area with barrier cream placed. Recommendation for skilled inpatient follow up therapy, <3 hours/day.  Will be hoyer lift only if appropriate     If plan is discharge home, recommend the following:         Functional Status Assessment   Patient has had a recent decline in their functional status and demonstrates the ability to make significant improvements in function in a reasonable and predictable amount of time.     Equipment Recommendations   Wheelchair cushion (measurements OT);Wheelchair (measurements OT);Hospital bed;Hoyer lift     Recommendations for Other Services         Precautions/Restrictions   Precautions Precautions: Fall Recall of Precautions/Restrictions: Impaired Restrictions Weight Bearing Restrictions Per Provider Order: No     Mobility Bed Mobility Overal bed mobility: Needs Assistance Bed Mobility: Rolling Rolling: Max assist, +2 for physical assistance, Used rails         General bed mobility comments: Able to roll with max assist +2 and hand over hand guidance to grasp rail and pull through UEs. Required positioning of LEs and cues to facilitate. Performed multiple times in bed. Limited by bowel incontinence. Unable to sit upright at EOB today.    Transfers                    General transfer comment: deferred      Balance                                           ADL either performed or assessed with clinical judgement   ADL Overall ADL's : Needs assistance/impaired                                       General ADL Comments: pt requires (A) for all adls. Session very limited due to constant diarrhea uncontrolled and pt with no awareness     Vision   Additional Comments: visually attend to name call     Perception         Praxis         Pertinent Vitals/Pain Pain Assessment Pain Assessment: PAINAD Breathing: occasional labored breathing, short period of hyperventilation Negative Vocalization: occasional moan/groan, low speech, negative/disapproving quality Facial Expression: sad, frightened, frown Body Language: tense, distressed pacing, fidgeting Consolability: distracted or reassured by voice/touch PAINAD Score: 5 Pain Intervention(s): Limited activity within patient's tolerance, Monitored during session     Extremity/Trunk Assessment Upper Extremity Assessment Upper Extremity Assessment: Generalized weakness   Lower Extremity Assessment Lower Extremity Assessment: Generalized weakness;Defer to PT evaluation   Cervical / Trunk Assessment Cervical / Trunk Assessment: Kyphotic   Communication Communication Communication: Impaired Factors  Affecting Communication: Reduced clarity of speech   Cognition Arousal: Alert Behavior During Therapy: Flat affect Cognition: Cognition impaired             OT - Cognition Comments: pt reports that her mother takes care of her. pt does state she is in a hospital but cannot stated the name of it                 Following commands: Impaired Following commands impaired: Follows one step commands inconsistently, Follows one step commands with increased time     Cueing  General Comments   Cueing Techniques: Verbal cues;Gestural cues;Tactile  cues  PT/OT/RN assisted with peri-care and hygiene. Skin breakdown noted peri area.   Exercises     Shoulder Instructions      Home Living Family/patient expects to be discharged to:: Unsure                                 Additional Comments: Limited historian, family not immediately available during evaluation today. Notes indicate bedbound at baseline. Pt does state she does get out of bed to wheelchair, but again is unable to provide comprehensive history due to impaired cognitive status.      Prior Functioning/Environment Prior Level of Function : Patient poor historian/Family not available                    OT Problem List: Decreased cognition;Decreased safety awareness;Decreased knowledge of use of DME or AE;Decreased knowledge of precautions;Cardiopulmonary status limiting activity;Decreased activity tolerance;Impaired balance (sitting and/or standing)   OT Treatment/Interventions: Self-care/ADL training;DME and/or AE instruction;Therapeutic activities;Cognitive remediation/compensation;Patient/family education;Balance training      OT Goals(Current goals can be found in the care plan section)   Acute Rehab OT Goals Patient Stated Goal: none stated OT Goal Formulation: Patient unable to participate in goal setting Time For Goal Achievement: 04/13/24 Potential to Achieve Goals: Fair   OT Frequency:  Min 1X/week    Co-evaluation              AM-PAC OT 6 Clicks Daily Activity     Outcome Measure Help from another person eating meals?: Total Help from another person taking care of personal grooming?: Total Help from another person toileting, which includes using toliet, bedpan, or urinal?: Total Help from another person bathing (including washing, rinsing, drying)?: Total Help from another person to put on and taking off regular upper body clothing?: Total Help from another person to put on and taking off regular lower body clothing?:  Total 6 Click Score: 6   End of Session Nurse Communication: Mobility status;Precautions;Need for lift equipment  Activity Tolerance: Patient tolerated treatment well Patient left: in bed;with call bell/phone within reach;with bed alarm set;with nursing/sitter in room  OT Visit Diagnosis: Unsteadiness on feet (R26.81);Muscle weakness (generalized) (M62.81)                Time: 8882-8866 OT Time Calculation (min): 16 min Charges:  OT General Charges $OT Visit: 1 Visit OT Evaluation $OT Eval Moderate Complexity: 1 Mod   Brynn, OTR/L  Acute Rehabilitation Services Office: [phone] .   Ely Molt 03/30/2024, 2:15 PM

## 2024-03-30 NOTE — Inpatient Diabetes Management (Signed)
 Inpatient Diabetes Program Recommendations  AACE/ADA: New Consensus Statement on Inpatient Glycemic Control (2015)  Target Ranges:  Prepandial:   less than 140 mg/dL      Peak postprandial:   less than 180 mg/dL (1-2 hours)      Critically ill patients:  140 - 180 mg/dL   Lab Results  Component Value Date   GLUCAP 151 (H) 03/30/2024   HGBA1C 7.2 (H) 03/28/2024    Review of Glycemic Control  Latest Reference Range & Units 03/29/24 10:44 03/29/24 15:33 03/29/24 20:16 03/30/24 00:11 03/30/24 03:38 03/30/24 04:08  Glucose-Capillary 70 - 99 mg/dL 822 (H) 826 (H) 840 (H) 75 60 (L) 151 (H)  (H): Data is abnormally high (L): Data is abnormally low Diabetes history: Type 2 DM Outpatient Diabetes medications: Metformin 500 mg BID Current orders for Inpatient glycemic control:  Lantus  20 units every day, Novolog  2 units Q4H, Novolog  0-15 units Q4H  Inpatient Diabetes Program Recommendations:    Noted hypoglycemia following discontinuation/holding of tube feeds due to vomiting. Additionally, Lantus  increased this AM. Concern for additional hypoglycemia.   Consider reducing Lantus  to 14 units every day.  Thanks, Tinnie Minus, MSN, RNC-OB Diabetes Coordinator [phone] (8a-5p)

## 2024-03-30 NOTE — Evaluation (Signed)
 Physical Therapy Evaluation Patient Details Name: Toni [name] MRN: [id number] DOB: 10-28-1932 Today's Date: 03/30/2024  History of Present Illness  89 y/o woman who presented 9/9 with confusion, poor appetite and PO intake, found to have urosepsis. Acute metabolic encephalopathy. PMH: bedbound after multiple leg surgeries, HTN, DM2  Clinical Impression  Pt admitted with above diagnosis. Limited assessment due to poor historian and frequent episodes of liquid bowel incontinence. Required max assist +2 for bed mobility while assisting with peri-care. Will follow acutely to determine patient and family goals of care and get a better idea of her PLOF but sounds like she was bedbound. Unsure if she was able to perform any self care or would benefit from acute PT for reducing burden of care on caregivers. Will follow and update as more information comes available. Pt currently with functional limitations due to the deficits listed below (see PT Problem List). Pt will benefit from acute skilled PT to increase their independence and safety with mobility to allow discharge.           If plan is discharge home, recommend the following: Two people to help with walking and/or transfers;Two people to help with bathing/dressing/bathroom;Assistance with cooking/housework;Direct supervision/assist for medications management;Direct supervision/assist for financial management;Assist for transportation;Supervision due to cognitive status   Can travel by private vehicle   No    Equipment Recommendations None recommended by PT (TBD once current equipment PTA is confirmed.)  Recommendations for Other Services       Functional Status Assessment Patient has had a recent decline in their functional status and/or demonstrates limited ability to make significant improvements in function in a reasonable and predictable amount of time     Precautions / Restrictions Precautions Precautions: Fall Recall of  Precautions/Restrictions: Impaired Restrictions Weight Bearing Restrictions Per Provider Order: No      Mobility  Bed Mobility Overal bed mobility: Needs Assistance Bed Mobility: Rolling Rolling: Max assist, +2 for physical assistance, Used rails         General bed mobility comments: Able to roll with max assist +2 and hand over hand guidance to grasp rail and pull through UEs. Required positioning of LEs and cues to facilitate. Performed multiple times in bed. Limited by bowel incontinence. Unable to sit upright at EOB today.    Transfers                        Ambulation/Gait                  Stairs            Wheelchair Mobility     Tilt Bed    Modified Rankin (Stroke Patients Only)       Balance                                             Pertinent Vitals/Pain Pain Assessment Pain Assessment: PAINAD Breathing: occasional labored breathing, short period of hyperventilation Negative Vocalization: occasional moan/groan, low speech, negative/disapproving quality Facial Expression: sad, frightened, frown Body Language: tense, distressed pacing, fidgeting Consolability: distracted or reassured by voice/touch PAINAD Score: 5 Pain Intervention(s): Limited activity within patient's tolerance, Monitored during session, Repositioned    Home Living Family/patient expects to be discharged to:: Unsure  Additional Comments: Limited historian, family not immediately available during evaluation today. Notes indicate bedbound at baseline. Pt does state she does get out of bed to wheelchair, but again is unable to provide comprehensive history due to impaired cognitive status.    Prior Function Prior Level of Function : Patient poor historian/Family not available                     Extremity/Trunk Assessment   Upper Extremity Assessment Upper Extremity Assessment: Defer to OT evaluation     Lower Extremity Assessment Lower Extremity Assessment: Generalized weakness;Difficult to assess due to impaired cognition       Communication   Communication Communication: Impaired Factors Affecting Communication: Reduced clarity of speech    Cognition Arousal: Alert Behavior During Therapy: Flat affect   PT - Cognitive impairments: No family/caregiver present to determine baseline                         Following commands: Impaired Following commands impaired: Follows one step commands inconsistently, Follows one step commands with increased time     Cueing Cueing Techniques: Verbal cues, Gestural cues, Tactile cues     General Comments General comments (skin integrity, edema, etc.): Frequent liquid stool incontinence with minimal bed mobility. PT/OT/RN assisted with peri-care and hygiene. Skin breakdown noted peri area.    Exercises     Assessment/Plan    PT Assessment Patient needs continued PT services  PT Problem List Decreased strength;Decreased range of motion;Decreased activity tolerance;Decreased mobility;Decreased balance;Decreased coordination;Decreased cognition;Decreased knowledge of use of DME;Obesity;Decreased skin integrity;Pain       PT Treatment Interventions DME instruction;Functional mobility training;Therapeutic activities;Therapeutic exercise;Neuromuscular re-education;Balance training;Cognitive remediation;Patient/family education;Wheelchair mobility training    PT Goals (Current goals can be found in the Care Plan section)  Acute Rehab PT Goals Patient Stated Goal: none stated PT Goal Formulation: Patient unable to participate in goal setting Time For Goal Achievement: 04/13/24 Potential to Achieve Goals: Poor    Frequency Min 1X/week     Co-evaluation               AM-PAC PT 6 Clicks Mobility  Outcome Measure Help needed turning from your back to your side while in a flat bed without using bedrails?: Total Help  needed moving from lying on your back to sitting on the side of a flat bed without using bedrails?: Total Help needed moving to and from a bed to a chair (including a wheelchair)?: Total Help needed standing up from a chair using your arms (e.g., wheelchair or bedside chair)?: Total Help needed to walk in hospital room?: Total Help needed climbing 3-5 steps with a railing? : Total 6 Click Score: 6    End of Session   Activity Tolerance: Other (comment) (Frequent liquid stool incontinence) Patient left: in bed;with call bell/phone within reach;with bed alarm set;with nursing/sitter in room Nurse Communication: Mobility status PT Visit Diagnosis: Other abnormalities of gait and mobility (R26.89);Muscle weakness (generalized) (M62.81);Other symptoms and signs involving the nervous system (R29.898);Pain Pain - part of body:  (peri area)    Time: 8882-8865 PT Time Calculation (min) (ACUTE ONLY): 17 min   Charges:   PT Evaluation $PT Eval Moderate Complexity: 1 Mod   PT General Charges $$ ACUTE PT VISIT: 1 Visit         Leontine Roads, PT, DPT [facility]  Rehabilitation Services Physical Therapist Office: [phone] Website: [email or web address]   Leontine GORMAN Roads 03/30/2024, 2:03 PM

## 2024-03-30 NOTE — Progress Notes (Signed)
                                                                                                                                                                                                          Palliative Medicine Progress Note   Patient Name: Toni [name]       Date: 03/30/2024 DOB: 1933-03-18  Age: 89 y.o. MRN#: [id number] Attending Physician: Gretta Leita SQUIBB, DO Primary Care Physician: Patient, No Pcp Per Admit Date: 03/27/2024  Reason for Consultation/Follow-up: {Reason for Consult:23484}  HPI/Patient Profile: 89 y.o. female  with past medical history of chronic HFpEF, diabetes type 2, peripheral artery disease, cirrhosis, and HTN who presented to the ED on 03/27/2024 with nausea/vomiting, lethargy, and weakness.  She is admitted with sepsis secondary to UTI and colitis, AKI, lactic acidosis, metabolic acidosis, acute metabolic encephalopathy, and new onset A-fib.   Palliative Medicine has been consulted for goals of care discussions. Patient and family are faced with anticipatory care needs and complex medical decision making.   Subjective: ***  Objective:  Physical Exam          Vital Signs: BP 111/80   Pulse 71   Temp 98.4 F (36.9 C) (Oral)   Resp 14   Ht 5' 2 (1.575 m)   Wt 92.4 kg   SpO2 98%   BMI 37.26 kg/m  SpO2: SpO2: 98 % O2 Device: O2 Device: Nasal Cannula O2 Flow Rate: O2 Flow Rate (L/min): 2 L/min  Intake/output summary:  Intake/Output Summary (Last 24 hours) at 03/30/2024 1531 Last data filed at 03/30/2024 1400 Gross per 24 hour  Intake 1575.6 ml  Output 995 ml  Net 580.6 ml    LBM: Last BM Date : 03/30/24     Palliative Assessment/Data: ***     Palliative Medicine Assessment & Plan   Assessment: Principal Problem:   Septic shock (HCC) Active Problems:   Malnutrition of moderate degree    Recommendations/Plan: ***  Goals of Care and Additional Recommendations: Limitations on Scope of Treatment: {Recommended Scope and  Preferences:21019}  Code Status:   Prognosis:  {Palliative Care Prognosis:23504}  Discharge Planning: {Palliative dispostion:23505}  Care plan was discussed with ***  Thank you for allowing the Palliative Medicine Team to assist in the care of this patient.   ***   Recardo KATHEE Loll, NP   Please contact Palliative Medicine Team phone at [phone] for questions and concerns.  For individual providers, please see AMION.

## 2024-03-30 NOTE — TOC Initial Note (Addendum)
 Transition of Care [facility]) - Initial/Assessment Note    Patient Details  Name: Toni [name] MRN: [id number] Date of Birth: Nov 10, 1932  [facility] of [facility]) CM/SW Contact:    Toni [name], LCSWA Phone Number: 03/30/2024, 2:40 PM  Clinical Narrative:                  CSW received consult for possible SNF placement at time of discharge. Due to patients current orientation CSW spoke with patients daughter Toni [name] regarding PT recommendation of SNF placement at time of discharge.  Patients daughter reports PTA patient comes from home with her.Patients daughter expressed understanding of PT recommendation and is agreeable to SNF placement for patient at time of discharge. Patients daughter gave CSW permission to fax patient out for SNF placement. CSW informed patients daughter that CSW will fax patient out for SNF placement closer to patient being medically ready for dc.CSW discussed insurance authorization process.No further questions reported at this time. CSW to continue to follow and assist with discharge planning needs.   Expected Discharge Plan: Skilled Nursing Facility Barriers to Discharge: Continued Medical Work up   Patient Goals and CMS Choice     Choice offered to / list presented to : Adult Children Toni [name] daughter)      Expected Discharge Plan and Services In-house Referral: Clinical Social Work     Living arrangements for the past 2 months: Single Family Home                                      Prior Living Arrangements/Services Living arrangements for the past 2 months: Single Family Home Lives with:: Adult Children Toni [name]) Patient language and need for interpreter reviewed:: Yes        Need for Family Participation in Patient Care: Yes (Comment) Care giver support system in place?: Yes (comment)   Criminal Activity/Legal Involvement Pertinent to Current Situation/Hospitalization: No - Comment as needed  Activities of Daily Living       Permission Sought/Granted Permission sought to share information with : Case Manager, Magazine features editor, Family Supports                Emotional Assessment       Orientation: : Oriented to Self, Oriented to Place Alcohol / Substance Use: Not Applicable Psych Involvement: No (comment)  Admission diagnosis:  Lactic acidosis [E87.20] Fecal impaction (HCC) [K56.41] Sepsis secondary to UTI (HCC) [A41.9, N39.0] Septic shock (HCC) [A41.9, R65.21] Acute metabolic encephalopathy [G93.41] Atrial fibrillation, unspecified type (HCC) [I48.91] Patient Active Problem List   Diagnosis Date Noted   Septic shock (HCC) 03/28/2024   Malnutrition of moderate degree 03/28/2024   Acute on chronic diastolic CHF (congestive heart failure) (HCC) 03/29/2022   Type 2 diabetes mellitus with hyperglycemia, with long-term current use of insulin  (HCC) 03/29/2022   Pyuria 03/29/2022   Bacteriuria 03/29/2022   Essential hypertension 03/29/2022   Prolonged QT interval 03/29/2022   PVD (peripheral vascular disease) (HCC) 03/29/2022   Obesity, Class III, BMI 40-49.9 (morbid obesity) 03/29/2022   Pulmonary hypertension (HCC) 03/29/2022   Hyponatremia 03/29/2022   Normocytic anemia 03/29/2022   Stress fracture, left femur, initial encounter for fracture 03/24/2022   Femur fracture (HCC) 03/24/2022   Sepsis (HCC)    Goals of care, counseling/discussion    Palliative care by specialist    Streptococcal sepsis (HCC) 09/02/2016   Streptococcal bacteremia 09/02/2016   E. coli UTI 09/02/2016  Acute metabolic encephalopathy 09/02/2016   Leukocytosis 09/02/2016   Community acquired pneumonia 08/31/2016   PCP:  Patient, No Pcp Per Pharmacy:   [id number] [place], [place] - [address] AT [facility] OF [address] [place] [address] Phone: [phone] Fax: [phone]  [facility] [address] [place] [address] Phone: [phone] Fax: [phone]     Social Drivers of Health (SDOH) Social History: SDOH Screenings   Tobacco Use: Low Risk  (03/27/2024)   SDOH Interventions:     Readmission Risk Interventions     No data to display

## 2024-03-30 NOTE — Progress Notes (Signed)
 Hypoglycemic Event  CBG: 60  Treatment: D50 25 mL (12.5 gm)  Symptoms: None  Follow-up CBG: Time:0408 CBG Result:151  Possible Reasons for Event: Inadequate meal intake     Toni [name]

## 2024-03-31 DIAGNOSIS — J9601 Acute respiratory failure with hypoxia: Secondary | ICD-10-CM | POA: Diagnosis not present

## 2024-03-31 DIAGNOSIS — A419 Sepsis, unspecified organism: Secondary | ICD-10-CM | POA: Diagnosis not present

## 2024-03-31 DIAGNOSIS — J9602 Acute respiratory failure with hypercapnia: Secondary | ICD-10-CM | POA: Diagnosis not present

## 2024-03-31 DIAGNOSIS — G9341 Metabolic encephalopathy: Secondary | ICD-10-CM | POA: Diagnosis not present

## 2024-03-31 LAB — GLUCOSE, CAPILLARY
Glucose-Capillary: 97 mg/dL (ref 70–99)
Glucose-Capillary: 76 mg/dL (ref 70–99)
Glucose-Capillary: 89 mg/dL (ref 70–99)
Glucose-Capillary: 101 mg/dL — ABNORMAL HIGH (ref 70–99)
Glucose-Capillary: 135 mg/dL — ABNORMAL HIGH (ref 70–99)

## 2024-03-31 LAB — AMMONIA: Ammonia: 37 umol/L — ABNORMAL HIGH (ref 9–35)

## 2024-03-31 LAB — BASIC METABOLIC PANEL WITH GFR
Anion gap: 13 (ref 5–15)
BUN: 27 mg/dL — ABNORMAL HIGH (ref 8–23)
CO2: 24 mmol/L (ref 22–32)
Calcium: 8.8 mg/dL — ABNORMAL LOW (ref 8.9–10.3)
Chloride: 105 mmol/L (ref 98–111)
Creatinine, Ser: 0.59 mg/dL (ref 0.44–1.00)
GFR, Estimated: >60 mL/min (ref >60)
Glucose, Bld: 97 mg/dL (ref 70–99)
Potassium: 4.7 mmol/L (ref 3.5–5.1)
Sodium: 142 mmol/L (ref 135–145)

## 2024-03-31 LAB — CBC
HCT: 39 % (ref 36.0–46.0)
Hemoglobin: 12 g/dL (ref 12.0–15.0)
MCH: 27.6 pg (ref 26.0–34.0)
MCHC: 30.8 g/dL (ref 30.0–36.0)
MCV: 89.7 fL (ref 80.0–100.0)
Platelets: 223 K/uL (ref 150–400)
RBC: 4.35 MIL/uL (ref 3.87–5.11)
RDW: 13.7 % (ref 11.5–15.5)
WBC: 10.6 K/uL — ABNORMAL HIGH (ref 4.0–10.5)
nRBC: 0.7 % — ABNORMAL HIGH (ref 0.0–0.2)

## 2024-03-31 LAB — MAGNESIUM: Magnesium: 2.7 mg/dL — ABNORMAL HIGH (ref 1.7–2.4)

## 2024-03-31 LAB — PHOSPHORUS: Phosphorus: 4 mg/dL (ref 2.5–4.6)

## 2024-03-31 MED ORDER — ORAL CARE MOUTH RINSE: 15.0000 mL | OROMUCOSAL | Status: DC | PRN

## 2024-03-31 MED ORDER — SMOG ENEMA
960.0000 mL | Freq: Once | RECTAL | Status: AC
  Administered 2024-03-31: 960 mL via RECTAL
  Filled 2024-03-31: qty 960

## 2024-03-31 MED ORDER — MIDODRINE HCL 5 MG PO TABS
5.0000 mg | ORAL_TABLET | Freq: Three times a day (TID) | ORAL | Status: DC
  Administered 2024-03-31 – 2024-04-15 (×47): 5 mg
  Filled 2024-03-31 (×46): qty 1

## 2024-03-31 MED ORDER — ORAL CARE MOUTH RINSE
15.0000 mL | OROMUCOSAL | Status: DC
  Administered 2024-03-31 – 2024-04-07 (×26): 15 mL via OROMUCOSAL

## 2024-03-31 MED ORDER — LACTULOSE 10 GM/15ML PO SOLN: 30.0000 g | Freq: Three times a day (TID) | ORAL | Status: DC

## 2024-03-31 MED ORDER — PANTOPRAZOLE SODIUM 40 MG IV SOLR
40.0000 mg | Freq: Once | INTRAVENOUS | Status: DC
Start: 2024-03-31 — End: 2024-03-31

## 2024-03-31 MED ORDER — PANTOPRAZOLE SODIUM 40 MG IV SOLR
40.0000 mg | Freq: Every day | INTRAVENOUS | Status: DC
  Administered 2024-04-01 – 2024-04-05 (×5): 40 mg via INTRAVENOUS
  Filled 2024-03-31 (×5): qty 10

## 2024-03-31 MED ORDER — LACTATED RINGERS IV BOLUS
1000.0000 mL | Freq: Once | INTRAVENOUS | Status: AC
Start: 2024-03-31 — End: 2024-03-31
  Administered 2024-03-31: 1000 mL via INTRAVENOUS

## 2024-03-31 MED ORDER — SODIUM CHLORIDE 0.9 % IV SOLN
INTRAVENOUS | Status: AC
Start: 2024-03-31 — End: 2024-04-01

## 2024-03-31 MED ORDER — LACTULOSE 10 GM/15ML PO SOLN
30.0000 g | Freq: Three times a day (TID) | ORAL | Status: DC
  Administered 2024-03-31 – 2024-04-06 (×18): 30 g
  Filled 2024-03-31 (×19): qty 45

## 2024-03-31 NOTE — Progress Notes (Signed)
 Daughter updated at bedside. Con't current care.   Ms. Royse is more alert today; per daughter she was not like this yesterday afternoon. She wants to try drinking. Ok for bedside swallow test and bipap break. Con't TF.   Leita SHAUNNA Gaskins, DO 03/31/24 2:22 PM [facility]  For contact information, see Amion. If no response to pager, please call PCCM consult pager. After hours, 7PM- 7AM, please call Elink.

## 2024-03-31 NOTE — Progress Notes (Signed)
 Pt take off BiPAP and placed on 4L [place]. Pt tolerating well at this time, no increased WOB noted, RN aware, BiPAP at bedside in stand by.     03/31/24 1440  Therapy Vitals  Pulse Rate 93  Resp (!) 27  MEWS Score/Color  MEWS Score 2  MEWS Score Color Yellow  Respiratory Assessment  Assessment Type Assess only  Respiratory Pattern Regular;Unlabored  Chest Assessment Chest expansion symmetrical  Cough Non-productive  Sputum Amount None  Bilateral Breath Sounds Diminished  Oxygen Therapy/Pulse Ox  O2 Device Nasal Cannula  O2 Therapy Oxygen  O2 Flow Rate (L/min) 4 L/min  SpO2 100 %

## 2024-03-31 NOTE — Progress Notes (Signed)
 NAME:  Toni [name], MRN:  [id number], DOB:  June 29, 1933, LOS: 3 ADMISSION DATE:  03/27/2024, CONSULTATION DATE:  03/28/24 REFERRING MD:  EDP, CHIEF COMPLAINT:  hypotension   History of Present Illness:  89 yo female presented via EMS after reported coffee ground emesis. She was noted to have BP's in the 50's-70's, given ivf with transient improvement. Per report pt was increasingly weak and lethargic today developed n/v. She is typically alert but was progressively confused over the course of the day as well. All history is obtained from chart 2/2 pt's acute encephalopathy. Pt has not had a bowel movement for 5 days and has been suffering from abdominal distention and pain when today she began having nausea with emesis.   Pt reportedly has frequent uti's in past. Ua pending. Lactate elevated on presentation but improving to 3 with volume. Ultimately, she required initiation of norepi to maintain BP. Found to be in aki with Cr 1.5 (baseline 0.7) with some hydronephrosis 2/2 R staghorn stone. There is also stigmata of espophagitis and colitis.   Pertinent  Medical History  HFpEF T2dm with hyperglycemia Htn Obesity cirrhosis  Significant Hospital Events: Including procedures, antibiotic start and stop dates in addition to other pertinent events   Admitted to icu 9/10 on norepi 9/11 more altered this AM, but off pressors; IPAL yesterday and PMT seen - decision to make DNR-L 9/12: pending ongoing GOC discussion   Interim History / Subjective:  Remains on bipap this morning. Awake but minimally able to answer questions.   Objective    Blood pressure 115/76, pulse (!) 190, temperature (!) 97 F (36.1 C), temperature source Oral, resp. rate (!) 22, height 5' 2 (1.575 m), weight 88.9 kg, SpO2 (!) 86%.    Vent Mode: BIPAP FiO2 (%):  [30 %] 30 % Set Rate:  [15 bmp] 15 bmp PEEP:  [5 cmH20] 5 cmH20 Pressure Support:  [12 cmH20] 12 cmH20   Intake/Output Summary (Last 24 hours) at 03/31/2024  1143 Last data filed at 03/31/2024 1000 Gross per 24 hour  Intake 1084.39 ml  Output 635 ml  Net 449.39 ml   Filed Weights   03/28/24 0322 03/29/24 0500 03/31/24 0308  Weight: 89.6 kg 92.4 kg 88.9 kg    General: frail appearing elderly woman lying in bed in NAD HEENT:  [place]/AT, eyes anicteric Neuro: Arouses to voice, not trying to talk other than 1-word answers. Not moving around much in bed.  CV: S1S2, RRR PULM:  breathing comfortably on BiPAP. Reduced basilar breath sounds.  GI: obese, soft, NT Extremities: mild edema, minimal muscle mass  BUN 27 Cr 0.59 WBC 10.6 H/H 12/39 Platelets 223  Resolved problem list  Shock  Assessment and Plan  Sepsis 2/2 UTI- likely pyelonephritis and colitis; shock resolved E. Coli & Proteus mirabilis UTI -7 days of zosyn  -midodrine  5mg  TID  Chronic HFpEF ; EF 70-75%, hyperdynamic, severe MR calcifications -avoid hypervolemia  Atrial fibrillation, new-onset- likely due to septic shock.  -no need for [facility] or amiodarone  unless recurrent -cardiac monitoring   Cirrhosis- Seen on CT AP on admission Hyperammonemia -lactulose  -prevent hypervolemia  Constipation; large stool volume Emesis - resolved Esophagitis Inguinal hernia Had question at presentation of hematemesis-- has had no signs of GIB Plan film with very large stool ball. She was digitally disimpacted; however, stool ball is more proximal. Has had an aggressive bowel regimen.  -smog enema, lactulose , sorbitol  -no rectal tube -con't trial of reglan  for motility -decrease PPI to once daily  Acute kidney injury with hydronephrosis, improving  Staghorn nephrolithiasis- no hydronephrosis on US  Hypophosphatemia  -per urology large stone that will likely need outpatient percutaneous nephrolithotomy  -strict I/O -maintain adequate perfusion -monitor electrolytes, replete as needed   Type 2 DM - ssi - not eneding -hold lantus  for now  Moderate protein energy  malnutrition -vitamins -TF> restart since no more vomiting  GOC: made DNR-L  after discussion with CCM and PMT teams and daughter. She is bed bound at baseline now with worsening mental status and critical illness. See respective IPAL and PMT notes. Will optimize her but needs ongoing discussion regarding likely EOL care. So far not making any progress this admission despite aggressive care measures.   Labs   CBC: Recent Labs  Lab 03/27/24 2007 03/27/24 2023 03/28/24 0248 03/29/24 0925 03/29/24 1055 03/29/24 1434 03/29/24 1557 03/30/24 0231 03/30/24 0413 03/31/24 0242  WBC 14.3*  --  11.7* 14.0*  --  11.6*  --  13.7*  --  10.6*  NEUTROABS 10.8*  --   --   --   --   --   --   --   --   --   HGB 14.9   < > 11.7* 10.4*   < > 10.7* 10.5* 10.8* 10.5* 12.0  HCT 48.5*   < > 36.9 33.5*   < > 34.2* 31.0* 34.6* 31.0* 39.0  MCV 88.8  --  87.2 89.3  --  88.6  --  87.6  --  89.7  PLT 398  --  263 166  --  160  --  199  --  223   < > = values in this interval not displayed.    Basic Metabolic Panel: Recent Labs  Lab 03/28/24 0759 03/28/24 1001 03/29/24 0323 03/29/24 1055 03/29/24 1433 03/29/24 1434 03/29/24 1557 03/30/24 0231 03/30/24 0413 03/31/24 0242  NA 137  --  137   < >  --  138 139 141 140 142  K 3.8  --  3.4*   < >  --  3.2* 2.8* 3.8 3.4* 4.7  CL 97*  --  98  --   --  101  --  107  --  105  CO2 23  --  24  --   --  27  --  25  --  24  GLUCOSE 223*  --  171*  --   --  177*  --  66*  --  97  BUN 42*  --  40*  --   --  35*  --  31*  --  27*  CREATININE 1.01*  --  0.84  --   --  0.68  --  0.50  --  0.59  CALCIUM 8.1*  --  8.3*  --   --  8.4*  --  8.5*  --  8.8*  MG 2.5*  --  2.7*  --  2.7*  --   --  2.6*  --  2.7*  PHOS  --  2.4* 3.2  --   --   --   --  1.5*  --  4.0   < > = values in this interval not displayed.   GFR: Estimated Creatinine Clearance: 49.4 mL/min (by C-G formula based on SCr of 0.59 mg/dL). Recent Labs  Lab 03/28/24 1001 03/28/24 1334 03/28/24 1640  03/29/24 0925 03/29/24 1434 03/30/24 0231 03/31/24 0242  WBC  --   --   --  14.0* 11.6* 13.7* 10.6*  LATICACIDVEN 5.1* 3.3*  4.4* 1.9  --   --   --       Critical care time:     This patient is critically ill with multiple organ system failure which requires frequent high complexity decision making, assessment, support, evaluation, and titration of therapies. This was completed through the application of advanced monitoring technologies and extensive interpretation of multiple databases. During this encounter critical care time was devoted to patient care services described in this note for 34 minutes.  Leita SHAUNNA Gaskins, DO 03/31/24 12:05 PM [facility]  For contact information, see Amion. If no response to pager, please call PCCM consult pager. After hours, 7PM- 7AM, please call Elink.

## 2024-04-01 DIAGNOSIS — Z7189 Other specified counseling: Secondary | ICD-10-CM | POA: Diagnosis not present

## 2024-04-01 DIAGNOSIS — G9341 Metabolic encephalopathy: Secondary | ICD-10-CM | POA: Diagnosis not present

## 2024-04-01 DIAGNOSIS — A419 Sepsis, unspecified organism: Secondary | ICD-10-CM | POA: Diagnosis not present

## 2024-04-01 DIAGNOSIS — K5641 Fecal impaction: Secondary | ICD-10-CM

## 2024-04-01 DIAGNOSIS — Z515 Encounter for palliative care: Secondary | ICD-10-CM | POA: Diagnosis not present

## 2024-04-01 DIAGNOSIS — R54 Age-related physical debility: Secondary | ICD-10-CM

## 2024-04-01 DIAGNOSIS — J9602 Acute respiratory failure with hypercapnia: Secondary | ICD-10-CM | POA: Diagnosis not present

## 2024-04-01 DIAGNOSIS — E440 Moderate protein-calorie malnutrition: Secondary | ICD-10-CM

## 2024-04-01 DIAGNOSIS — J9601 Acute respiratory failure with hypoxia: Secondary | ICD-10-CM | POA: Diagnosis not present

## 2024-04-01 LAB — GLUCOSE, CAPILLARY
Glucose-Capillary: 100 mg/dL — ABNORMAL HIGH (ref 70–99)
Glucose-Capillary: 123 mg/dL — ABNORMAL HIGH (ref 70–99)
Glucose-Capillary: 153 mg/dL — ABNORMAL HIGH (ref 70–99)
Glucose-Capillary: 154 mg/dL — ABNORMAL HIGH (ref 70–99)
Glucose-Capillary: 168 mg/dL — ABNORMAL HIGH (ref 70–99)
Glucose-Capillary: 293 mg/dL — ABNORMAL HIGH (ref 70–99)
Glucose-Capillary: 298 mg/dL — ABNORMAL HIGH (ref 70–99)
Glucose-Capillary: 42 mg/dL — CL (ref 70–99)
Glucose-Capillary: 44 mg/dL — CL (ref 70–99)

## 2024-04-01 LAB — URINE CULTURE: Culture: >=100000 — AB

## 2024-04-01 MED ORDER — INSULIN ASPART 100 UNIT/ML IJ SOLN
0.0000 [IU] | INTRAMUSCULAR | Status: DC
  Administered 2024-04-01 (×2): 8 [IU] via SUBCUTANEOUS
  Administered 2024-04-02 (×3): 3 [IU] via SUBCUTANEOUS
  Administered 2024-04-02: 11 [IU] via SUBCUTANEOUS
  Administered 2024-04-03 (×3): 3 [IU] via SUBCUTANEOUS
  Administered 2024-04-03: 11 [IU] via SUBCUTANEOUS
  Administered 2024-04-03: 8 [IU] via SUBCUTANEOUS
  Administered 2024-04-03: 5 [IU] via SUBCUTANEOUS
  Administered 2024-04-04: 8 [IU] via SUBCUTANEOUS
  Administered 2024-04-04 (×2): 3 [IU] via SUBCUTANEOUS
  Administered 2024-04-04: 8 [IU] via SUBCUTANEOUS
  Administered 2024-04-04: 5 [IU] via SUBCUTANEOUS
  Administered 2024-04-05 (×2): 3 [IU] via SUBCUTANEOUS
  Administered 2024-04-05: 5 [IU] via SUBCUTANEOUS
  Administered 2024-04-05: 3 [IU] via SUBCUTANEOUS
  Administered 2024-04-05: 8 [IU] via SUBCUTANEOUS
  Administered 2024-04-05: 3 [IU] via SUBCUTANEOUS
  Administered 2024-04-06: 5 [IU] via SUBCUTANEOUS
  Administered 2024-04-06: 3 [IU] via SUBCUTANEOUS
  Administered 2024-04-06 (×3): 5 [IU] via SUBCUTANEOUS
  Administered 2024-04-07 (×2): 2 [IU] via SUBCUTANEOUS
  Administered 2024-04-07: 6 [IU] via SUBCUTANEOUS
  Administered 2024-04-07 – 2024-04-08 (×5): 3 [IU] via SUBCUTANEOUS
  Administered 2024-04-08 (×2): 5 [IU] via SUBCUTANEOUS
  Administered 2024-04-09: 2 [IU] via SUBCUTANEOUS
  Administered 2024-04-09: 3 [IU] via SUBCUTANEOUS
  Administered 2024-04-09: 8 [IU] via SUBCUTANEOUS
  Administered 2024-04-09 – 2024-04-10 (×2): 3 [IU] via SUBCUTANEOUS
  Administered 2024-04-10: 5 [IU] via SUBCUTANEOUS
  Administered 2024-04-10: 3 [IU] via SUBCUTANEOUS
  Administered 2024-04-10: 5 [IU] via SUBCUTANEOUS
  Administered 2024-04-10: 2 [IU] via SUBCUTANEOUS
  Administered 2024-04-10: 8 [IU] via SUBCUTANEOUS
  Administered 2024-04-11 (×2): 5 [IU] via SUBCUTANEOUS
  Administered 2024-04-11: 8 [IU] via SUBCUTANEOUS
  Administered 2024-04-11: 2 [IU] via SUBCUTANEOUS
  Administered 2024-04-11: 5 [IU] via SUBCUTANEOUS
  Administered 2024-04-12 (×2): 3 [IU] via SUBCUTANEOUS
  Administered 2024-04-12: 5 [IU] via SUBCUTANEOUS
  Administered 2024-04-12 (×2): 8 [IU] via SUBCUTANEOUS
  Administered 2024-04-12 – 2024-04-13 (×2): 5 [IU] via SUBCUTANEOUS
  Administered 2024-04-13 (×2): 3 [IU] via SUBCUTANEOUS
  Administered 2024-04-13 (×2): 5 [IU] via SUBCUTANEOUS
  Administered 2024-04-13: 8 [IU] via SUBCUTANEOUS
  Administered 2024-04-14 (×3): 5 [IU] via SUBCUTANEOUS
  Administered 2024-04-14 (×2): 3 [IU] via SUBCUTANEOUS
  Administered 2024-04-14: 5 [IU] via SUBCUTANEOUS
  Administered 2024-04-15: 3 [IU] via SUBCUTANEOUS
  Administered 2024-04-15: 5 [IU] via SUBCUTANEOUS
  Administered 2024-04-15 (×2): 3 [IU] via SUBCUTANEOUS
  Administered 2024-04-15 – 2024-04-16 (×4): 5 [IU] via SUBCUTANEOUS
  Administered 2024-04-16: 8 [IU] via SUBCUTANEOUS
  Administered 2024-04-16: 5 [IU] via SUBCUTANEOUS
  Administered 2024-04-16: 8 [IU] via SUBCUTANEOUS
  Administered 2024-04-16: 5 [IU] via SUBCUTANEOUS
  Administered 2024-04-16 – 2024-04-17 (×2): 3 [IU] via SUBCUTANEOUS

## 2024-04-01 MED ORDER — SODIUM CHLORIDE 0.9 % IV SOLN
2.0000 g | INTRAVENOUS | Status: AC
  Administered 2024-04-01 – 2024-04-03 (×3): 2 g via INTRAVENOUS
  Filled 2024-04-01 (×3): qty 20

## 2024-04-01 MED ORDER — DEXTROSE 50 % IV SOLN: 25.0000 g | INTRAVENOUS | Status: AC

## 2024-04-01 MED ORDER — DEXTROSE 50 % IV SOLN
INTRAVENOUS | Status: AC
  Administered 2024-04-01: 25 mL
  Filled 2024-04-01: qty 50

## 2024-04-01 NOTE — Progress Notes (Signed)
 NAME:  Toni [name], MRN:  [id number], DOB:  09/06/1932, LOS: 4 ADMISSION DATE:  03/27/2024, CONSULTATION DATE:  03/28/24 REFERRING MD:  EDP, CHIEF COMPLAINT:  hypotension   History of Present Illness:  89 yo female presented via EMS after reported coffee ground emesis. She was noted to have BP's in the 50's-70's, given ivf with transient improvement. Per report pt was increasingly weak and lethargic today developed n/v. She is typically alert but was progressively confused over the course of the day as well. All history is obtained from chart 2/2 pt's acute encephalopathy. Pt has not had a bowel movement for 5 days and has been suffering from abdominal distention and pain when today she began having nausea with emesis.   Pt reportedly has frequent uti's in past. Ua pending. Lactate elevated on presentation but improving to 3 with volume. Ultimately, she required initiation of norepi to maintain BP. Found to be in aki with Cr 1.5 (baseline 0.7) with some hydronephrosis 2/2 R staghorn stone. There is also stigmata of espophagitis and colitis.   Pertinent  Medical History  HFpEF T2dm with hyperglycemia Htn Obesity cirrhosis  Significant Hospital Events: Including procedures, antibiotic start and stop dates in addition to other pertinent events   Admitted to icu 9/10 on norepi 9/11 more altered this AM, but off pressors; IPAL yesterday and PMT seen - decision to make DNR-L 9/12: pending ongoing GOC discussion   Interim History / Subjective:  Overnight no acute events, has not required BiPAP since yesterday.  Was up mostly at night, per her daughter is more lethargic during the day but is more awake overnight.   Objective    Blood pressure (!) 117/55, pulse 91, temperature (!) 97.5 F (36.4 C), temperature source Oral, resp. rate (!) 21, height 5' 2 (1.575 m), weight 90 kg, SpO2 97%.        Intake/Output Summary (Last 24 hours) at 04/01/2024 1337 Last data filed at 04/01/2024 1000 Gross  per 24 hour  Intake 536.1 ml  Output 130 ml  Net 406.1 ml   Filed Weights   03/29/24 0500 03/31/24 0308 04/01/24 0422  Weight: 92.4 kg 88.9 kg 90 kg    General: Frail elderly woman lying in bed in no acute distress HEENT: [place]/AT, eyes anicteric Neuro: Arouses to voice, more lethargic this morning than this afternoon when she was more awake.  Answering questions and only 1 word phrases.  Not conversational. CV: S1-S2, regular rate and rhythm PULM: Breathing comfortably on nasal cannula, reduced basilar breath sounds.  Mild tachypnea.  No accessory muscle use.   GI: Obese, soft, nontender Extremities: mild edema, no cyanosis  BG 160> 40s  Resolved problem list  Shock  Assessment and Plan  Sepsis 2/2 UTI- likely pyelonephritis and colitis; shock resolved E. Coli & Proteus mirabilis UTI -7 days of antibiotics, de-escalate to ceftriaxone  based on susceptibilities - Continue midodrine  5mg  TID; wean off as able  Chronic HFpEF ; EF 70-75%, hyperdynamic, severe MR calcifications -avoid hypervolemia  Atrial fibrillation, new-onset; back in NSR- likely due to septic shock.  -Need for anticoagulation or amiodarone  less recurrent - Telemetry monitoring  Cirrhosis- Seen on CT AP on admission Hyperammonemia - Prevent hypervolemia - Lactulose  regimen TID  Constipation; large stool volume Emesis - resolved Esophagitis, chronic  Inguinal hernia on CT Had question at presentation of hematemesis-- has had no signs of GIB this admission -Lactulose  3 times daily - No rectal tube with known stool ball -PPI once daily since no signs  of bleeding this admission  Acute kidney injury with hydronephrosis, improving  Staghorn nephrolithiasis- no hydronephrosis on US  Hypophosphatemia  -per urology large stone that will likely need outpatient percutaneous nephrolithotomy  - Strict I's/O - Recheck labs tomorrow - Maintain adequate perfusion  Type 2 DM; hypoglycemic this morning -Sliding scale  insulin  discontinued due to hypoglycemia - No long-acting insulin  - Can restart insulin  if BG>180  Moderate protein energy malnutrition -vitamins -Ok to bedside swallow eval if she is awake enough -con't TF at goal  Since she is off biap and more alert at her more typical parts of the day, she is appropriate to transfer to the floor. TRH to assume care tomorrow.   GOC: made DNR-I  after discussion with CCM and PMT teams and daughter. She is bed bound at baseline now with worsening mental status and critical illness. See respective IPAL and PMT notes. Will optimize her but needs ongoing discussion regarding likely EOL care. So far not making any progress this admission despite aggressive care measures.   Labs   CBC: Recent Labs  Lab 03/27/24 2007 03/27/24 2023 03/28/24 0248 03/29/24 0925 03/29/24 1055 03/29/24 1434 03/29/24 1557 03/30/24 0231 03/30/24 0413 03/31/24 0242  WBC 14.3*  --  11.7* 14.0*  --  11.6*  --  13.7*  --  10.6*  NEUTROABS 10.8*  --   --   --   --   --   --   --   --   --   HGB 14.9   < > 11.7* 10.4*   < > 10.7* 10.5* 10.8* 10.5* 12.0  HCT 48.5*   < > 36.9 33.5*   < > 34.2* 31.0* 34.6* 31.0* 39.0  MCV 88.8  --  87.2 89.3  --  88.6  --  87.6  --  89.7  PLT 398  --  263 166  --  160  --  199  --  223   < > = values in this interval not displayed.    Basic Metabolic Panel: Recent Labs  Lab 03/28/24 0759 03/28/24 1001 03/29/24 0323 03/29/24 1055 03/29/24 1433 03/29/24 1434 03/29/24 1557 03/30/24 0231 03/30/24 0413 03/31/24 0242  NA 137  --  137   < >  --  138 139 141 140 142  K 3.8  --  3.4*   < >  --  3.2* 2.8* 3.8 3.4* 4.7  CL 97*  --  98  --   --  101  --  107  --  105  CO2 23  --  24  --   --  27  --  25  --  24  GLUCOSE 223*  --  171*  --   --  177*  --  66*  --  97  BUN 42*  --  40*  --   --  35*  --  31*  --  27*  CREATININE 1.01*  --  0.84  --   --  0.68  --  0.50  --  0.59  CALCIUM 8.1*  --  8.3*  --   --  8.4*  --  8.5*  --  8.8*  MG  2.5*  --  2.7*  --  2.7*  --   --  2.6*  --  2.7*  PHOS  --  2.4* 3.2  --   --   --   --  1.5*  --  4.0   < > = values in this interval not  displayed.   GFR: Estimated Creatinine Clearance: 49.7 mL/min (by C-G formula based on SCr of 0.59 mg/dL). Recent Labs  Lab 03/28/24 1001 03/28/24 1334 03/28/24 1640 03/29/24 0925 03/29/24 1434 03/30/24 0231 03/31/24 0242  WBC  --   --   --  14.0* 11.6* 13.7* 10.6*  LATICACIDVEN 5.1* 3.3* 4.4* 1.9  --   --   --       Critical care time:       Leita SHAUNNA Gaskins, DO 04/01/24 1:37 PM [facility]  For contact information, see Amion. If no response to pager, please call PCCM consult pager. After hours, 7PM- 7AM, please call Elink.

## 2024-04-01 NOTE — Progress Notes (Signed)
 0830:  CBG 44----1/2 amp D50 given  0850:  CBG--123

## 2024-04-01 NOTE — Progress Notes (Signed)
 Pt admitted to rm 23 from 2H. Initiated tele. Oriented pt to the unit. Call bell within reach. Bed alarm activated.   Amado GORMAN Arabia, RN

## 2024-04-01 NOTE — Progress Notes (Signed)
 Palliative Medicine Progress Note   Patient Name: Toni [name]       Date: 04/01/2024 DOB: 1933/02/23  Age: 89 y.o. MRN#: [id number] Attending Physician: Gretta Leita SQUIBB, DO Primary Care Physician: Patient, No Pcp Per Admit Date: 03/27/2024    HPI/Patient Profile: 89 y.o. female  with past medical history of chronic HFpEF, diabetes type 2, peripheral artery disease, cirrhosis, and HTN who presented to the ED on 03/27/2024 with nausea/vomiting, lethargy, and weakness.  She is admitted with sepsis secondary to UTI and colitis, AKI, lactic acidosis, metabolic acidosis, acute metabolic encephalopathy, and new onset A-fib.   Palliative Medicine has been consulted for goals of care discussions. Patient and family are faced with anticipatory care needs and complex medical decision making.   Subjective: Chart reviewed. Update received from RN.  Patient assessed.  She tells me she is better than yesterday.  She is lying in bed watching TV.  Denies acute complaints. Patient has not required BiPAP since yesterday.  Discussion: I met today at bedside with daughter.  She expresses relief that patient's mental status has much improved in the past 2 days.   We discussed that despite patient's improved mental status, she remains quite frail with multiple issues including constipation with known large stool ball, UTI, acute kidney injury with hydronephrosis (improving), chronic CHF, new A-fib, and cirrhosis.  Daughter confirms plan to continue supportive interventions (treat the treatable) with disposition of SNF/rehab when patient is medically stable/optimized.  She plans for patient to return home after rehab.  Discussed the importance of ongoing conversation with the medical team related to overall plan of  care.   I discussed with daughter my recommendation to consider the addition of hospice support  when patient returns home from rehab. I explained that hospice would be extra care for patient, support for family, and assistance with symptom management and care when she further declines. Daughter understands and will consider.   Objective:  Physical Exam Constitutional:      General: She is not in acute distress.    Comments: Frail and chronically ill-appearing  HENT:     Head:     Comments: Cortrak in place Cardiovascular:     Rate and Rhythm: Normal rate.  Pulmonary:     Effort: Pulmonary effort is normal.  Neurological:     Mental  Status: She is alert.            O2 Device: O2 Device: Nasal Cannula O2 Flow Rate: O2 Flow Rate (L/min): 4 L/min   LBM: Last BM Date : 04/01/24      Palliative Medicine Assessment & Plan   Assessment: Principal Problem:   Septic shock (HCC) Active Problems:   Malnutrition of moderate degree    Recommendations/Plan: DNR with limited interventions Continue supportive interventions with the goal for medically optimized Disposition plan is SNF/rehab to improve functional status, then return home Encouraged daughter to consider home hospice support PMT will continue to follow  Prognosis:  Unable to determine  Discharge Planning: SNF/rehab   Thank you for allowing the Palliative Medicine Team to assist in the care of this patient.   Time: 36 minutes  Detailed review of medical records (labs, imaging, vital signs), medically appropriate exam, discussed with treatment team, counseling and education to patient, family, & staff, documenting clinical information, coordination of care.    Lucyann Romano B Snow Peoples, NP   Please contact Palliative Medicine Team phone at [phone] for questions and concerns.  For individual providers, please see AMION.

## 2024-04-02 DIAGNOSIS — R6521 Severe sepsis with septic shock: Secondary | ICD-10-CM | POA: Diagnosis not present

## 2024-04-02 DIAGNOSIS — A419 Sepsis, unspecified organism: Secondary | ICD-10-CM | POA: Diagnosis not present

## 2024-04-02 LAB — GLUCOSE, CAPILLARY
Glucose-Capillary: 113 mg/dL — ABNORMAL HIGH (ref 70–99)
Glucose-Capillary: 194 mg/dL — ABNORMAL HIGH (ref 70–99)
Glucose-Capillary: 199 mg/dL — ABNORMAL HIGH (ref 70–99)
Glucose-Capillary: 307 mg/dL — ABNORMAL HIGH (ref 70–99)
Glucose-Capillary: 307 mg/dL — ABNORMAL HIGH (ref 70–99)
Glucose-Capillary: 77 mg/dL (ref 70–99)

## 2024-04-02 LAB — CULTURE, BLOOD (ROUTINE X 2)
Culture: NO GROWTH
Special Requests: ADEQUATE
Special Requests: ADEQUATE

## 2024-04-02 LAB — CBC
HCT: 35.6 % — ABNORMAL LOW (ref 36.0–46.0)
Hemoglobin: 10.5 g/dL — ABNORMAL LOW (ref 12.0–15.0)
MCH: 27.3 pg (ref 26.0–34.0)
MCHC: 29.5 g/dL — ABNORMAL LOW (ref 30.0–36.0)
MCV: 92.5 fL (ref 80.0–100.0)
Platelets: 169 K/uL (ref 150–400)
RBC: 3.85 MIL/uL — ABNORMAL LOW (ref 3.87–5.11)
RDW: 14 % (ref 11.5–15.5)
WBC: 6.3 K/uL (ref 4.0–10.5)
nRBC: 0.9 % — ABNORMAL HIGH (ref 0.0–0.2)

## 2024-04-02 LAB — BASIC METABOLIC PANEL WITH GFR
Anion gap: 11 (ref 5–15)
Anion gap: 12 (ref 5–15)
BUN: 18 mg/dL (ref 8–23)
BUN: 16 mg/dL (ref 8–23)
CO2: 26 mmol/L (ref 22–32)
CO2: 24 mmol/L (ref 22–32)
Calcium: 8.7 mg/dL — ABNORMAL LOW (ref 8.9–10.3)
Calcium: 8.6 mg/dL — ABNORMAL LOW (ref 8.9–10.3)
Chloride: 108 mmol/L (ref 98–111)
Chloride: 109 mmol/L (ref 98–111)
Creatinine, Ser: 0.49 mg/dL (ref 0.44–1.00)
Creatinine, Ser: 0.55 mg/dL (ref 0.44–1.00)
GFR, Estimated: >60 mL/min (ref >60)
GFR, Estimated: >60 mL/min (ref >60)
Glucose, Bld: 93 mg/dL (ref 70–99)
Glucose, Bld: 195 mg/dL — ABNORMAL HIGH (ref 70–99)
Potassium: 2.3 mmol/L — CL (ref 3.5–5.1)
Potassium: 3.3 mmol/L — ABNORMAL LOW (ref 3.5–5.1)
Sodium: 145 mmol/L (ref 135–145)
Sodium: 145 mmol/L (ref 135–145)

## 2024-04-02 LAB — PHOSPHORUS: Phosphorus: 1.5 mg/dL — ABNORMAL LOW (ref 2.5–4.6)

## 2024-04-02 LAB — MAGNESIUM
Magnesium: 1.8 mg/dL (ref 1.7–2.4)
Magnesium: 2.1 mg/dL (ref 1.7–2.4)

## 2024-04-02 MED ORDER — POTASSIUM PHOSPHATES 15 MMOLE/5ML IV SOLN
30.0000 mmol | Freq: Once | INTRAVENOUS | Status: AC
  Administered 2024-04-02: 30 mmol via INTRAVENOUS
  Filled 2024-04-02: qty 10

## 2024-04-02 MED ORDER — POTASSIUM CHLORIDE 10 MEQ/100ML IV SOLN
10.0000 meq | INTRAVENOUS | Status: AC
  Administered 2024-04-02 (×6): 10 meq via INTRAVENOUS
  Filled 2024-04-02 (×6): qty 100

## 2024-04-02 MED ORDER — MAGNESIUM SULFATE 2 GM/50ML IV SOLN
2.0000 g | Freq: Once | INTRAVENOUS | Status: AC
  Administered 2024-04-02: 2 g via INTRAVENOUS
  Filled 2024-04-02: qty 50

## 2024-04-02 MED ORDER — POTASSIUM CHLORIDE 20 MEQ PO PACK
40.0000 meq | PACK | Freq: Two times a day (BID) | ORAL | Status: DC
  Administered 2024-04-02 – 2024-04-04 (×5): 40 meq
  Filled 2024-04-02 (×5): qty 2

## 2024-04-02 NOTE — Progress Notes (Signed)
 Nutrition Follow-up  DOCUMENTATION CODES:   Non-severe (moderate) malnutrition in context of acute illness/injury, Obesity unspecified  INTERVENTION:  -Clear liquids diet -Vital AF 1.2 continuous @ goal rate 55 ml/hr via Cortrak tube.   At goal this provides 1584 kcal, 99 g protein, 1069 ml fluid -Potassium low, IV replete by MD -Mg WNL, though on low end, IV replete by MD -Re-check phos, potassium today -Thiamine  x 7 days   NUTRITION DIAGNOSIS:   Moderate Malnutrition related to acute illness as evidenced by mild muscle depletion, energy intake < or equal to 50% for > or equal to 5 days.  Ongoing  GOAL:   Patient will meet greater than or equal to 90% of their needs  Met with EN  MONITOR:   Diet advancement, TF tolerance, Weight trends, Labs  REASON FOR ASSESSMENT:   Consult Enteral/tube feeding initiation and management, Assessment of nutrition requirement/status  ASSESSMENT:   89 yo female admitted with sepsis with UTI and colitis, +shock. Pt with increasing weakness and lethargy with development of N/V with constipation for about 1 week PTA; +AKI. PMH includes HFpEF, DM, HTN, obesity, cirrhosis  9/09 Abd xray with persistent air distention of colon with large retained feces at rectum.  9/10 Admitted to ICU, Cortrak, CT A/P: large stool ball in rectum, concerning for impaction and stercoral colitis, staghorn calculus in R. Kidney, mild R hydronephrosis and dilation of R ureter, marked wall thickening of distal esophagus, esophagitis, infiltrative mass not excluded. Sigmoid colon herniates into the left inguinal hernia without obstruction. +Cirrhosis.  9/14 transfer to 3E, diet upgraded to clear liquids   Attempted to speak to pt at bedside, pt sleeping soundly. No family in room. Pt's EN running @ 40 ml/hr, goal of 55 ml/hr. Discussed with RN; she stated that pt EN had been paused at some point r/t n/v; no documented emesis output. Nursing to increase pt to goal at  1230. Pt with no noted s/sx of EN intolerance today. Pt abd xray 9/10 showed gaseous distention of stomach, moderate to large amount of fecal material in dilated and redundant sigmoid. Multiple large BM's since, last BM today. Pt's last phos was 4.0 9/13. Today potassium low at 2.3, Mg low normal at 1.8. MD ordered IV potassium and mg. Re-check phos, potassium at 1300 today, mg tomorrow morning. Yesterday pt was upgraded to clear liquid diet; documented meal intake 75% x 2 meals.   Chicken broth, peach gelatin, ginger ale, grape juice, coffee for breakfast today Chicken broth, apple juice, peach gelatin, tropical fruit ice, iced tea for dinner last night  No changes to nutritional POC at this time, will continue to monitor, RDN available prn.    Labs K 2.3 Ca 8.7 Albumin  2.8 Mg 1.8 Lipase <10 Ammonia 37 H/H 10.5/35.6 A1c 7.2 T4 1.21  Medications  Chlorhexidine  Gluconate Cloth  6 each Topical Daily   heparin   5,000 Units Subcutaneous Q8H   insulin  aspart  0-15 Units Subcutaneous Q4H   lactulose   30 g Per Tube TID   metoCLOPramide  (REGLAN ) injection  10 mg Intravenous Q8H   midodrine   5 mg Per Tube Q8H   mouth rinse  15 mL Mouth Rinse 4 times per day   pantoprazole  (PROTONIX ) IV  40 mg Intravenous Daily   potassium chloride   40 mEq Per Tube BID   thiamine   100 mg Per Tube Daily      NUTRITION - FOCUSED PHYSICAL EXAM:  Flowsheet Row Most Recent Value  Orbital Region No depletion  Upper  Arm Region No depletion  Thoracic and Lumbar Region Unable to assess  Buccal Region Mild depletion  Temple Region Mild depletion  Clavicle Bone Region Mild depletion  Clavicle and Acromion Bone Region Mild depletion  Scapular Bone Region Mild depletion  Dorsal Hand Moderate depletion  Patellar Region Moderate depletion  Anterior Thigh Region Moderate depletion  Posterior Calf Region Moderate depletion  Edema (RD Assessment) Mild  Hair Reviewed  Eyes Reviewed  Mouth Unable to assess  [Pt  indicated she could not open her mouth at this time]  Skin Other (Comment)  [dry flaky/scaly skin]  Nails Reviewed    Diet Order:   Diet Order             Diet clear liquid Room service appropriate? Yes; Fluid consistency: Thin  Diet effective now                   EDUCATION NEEDS:   Education needs have been addressed  Skin:  Skin Assessment: Skin Integrity Issues: Skin Integrity Issues:: Stage I Stage I: anus  Last BM:  9/10 large type 6  Height:   Ht Readings from Last 1 Encounters:  03/28/24 5' 2 (1.575 m)    Weight:   Wt Readings from Last 1 Encounters:  04/02/24 87.9 kg    BMI:  Body mass index is 35.44 kg/m.  Estimated Nutritional Needs:   Kcal:  1550-1750 kcals  Protein:  90-110 g  Fluid:  >/=1.7 L  Toni [name], RDN, LDN Registered Dietitian Nutritionist RD Inpatient Contact Info in [place]

## 2024-04-02 NOTE — TOC Progression Note (Addendum)
 Transition of Care [facility]) - Progression Note    Patient Details  Name: KALYNNE WOMAC MRN: [id number] Date of Birth: [birth year]  Transition of [facility]) CM/SW Contact  Luise [name], [place] Phone Number: 04/02/2024, 12:15 PM  Clinical Narrative:   Patient currently has a cortrak. CSW will fax referrals for SNF once there are plans for cortrak removal.  CSW will continue to follow.    Expected Discharge Plan: Skilled Nursing Facility Barriers to Discharge: Continued Medical Work up               Expected Discharge Plan and Services In-house Referral: Clinical Social Work     Living arrangements for the past 2 months: Single Family Home                                       Social Drivers of Health (SDOH) Interventions SDOH Screenings   Food Insecurity: Patient Unable To Answer (03/30/2024)  Housing: Patient Unable To Answer (03/30/2024)  Transportation Needs: Patient Unable To Answer (03/30/2024)  Utilities: Patient Unable To Answer (03/30/2024)  Social Connections: Patient Unable To Answer (03/30/2024)  Tobacco Use: Low Risk  (03/27/2024)    Readmission Risk Interventions     No data to display

## 2024-04-02 NOTE — Plan of Care (Signed)

## 2024-04-02 NOTE — Progress Notes (Addendum)
 Progress Note   Patient: Toni [name] FMW:[id number] DOB: 1933-04-12 DOA: 03/27/2024     5 DOS: the patient was seen and examined on 04/02/2024   Brief hospital course: 89 y.o. female  with past medical history of chronic HFpEF, diabetes type 2, peripheral artery disease, cirrhosis, and HTN who presented to the ED on 03/27/2024 with nausea/vomiting, lethargy, and weakness. She is typically alert but was progressively confused over the course of the day as well. All history is obtained from chart 2/2 pt's acute encephalopathy. Pt has not had a bowel movement for 5 days and has been suffering from abdominal distention and pain at presentation. She is admitted with sepsis secondary to UTI and colitis, AKI, lactic acidosis, metabolic acidosis, acute metabolic encephalopathy, and new onset A-fib.  Pt reportedly has frequent uti's in past. UA pending. Lactate elevated on presentation but improving to 3 with volume. Ultimately, she required initiation of norepi to maintain BP. Found to be in aki with Cr 1.5 (baseline 0.7) with some hydronephrosis 2/2 R staghorn stone. There is also stigmata of espophagitis and colitis.    Palliative Medicine has been consulted for goals of care discussions. Patient and family are faced with anticipatory care needs and complex medical decision making.   Assessment and Plan: Sepsis 2/2 UTI- likely pyelonephritis and colitis; shock resolved E. Coli & Proteus mirabilis UTI E. coli resistant to ampicillin and Proteus resistant to Macrodantin Has been de-escalated to ceftriaxone  based on susceptibilities Continue midodrine  5mg  TID; wean off as able given resolution of sepsis physiology Was found to have staghorn calculus in the right kidney without any obstructive processes although did have mild right hydronephrosis and dilatation of the proximal right ureter on ultrasound and CT imaging   Chronic HFpEF  ECHO this admit: EF 70-75%, hyperdynamic, severe MR calcifications with  associated murmur  Acute hypokalemia Potassium 2.3 Begin potassium 40 meq twice daily per tube and follow labs closely This has been a recurring problem throughout the hospitalization and has likely been exacerbated by GI losses from lactulose . Magnesium  normal at 1.8   Atrial fibrillation, new-onset Now maintaining sinus rhythm and then this suspected precipitating event was septic shock  Keep K >4 and Mg close to 2 CHA2DS2-VASc 2: 6-if atrial fibrillation returns we will need to discuss with family risks and benefits of anticoagulation Continue Telemetry monitoring   Cirrhosis/Hyperammonemia Noted on CT abd/pelvis On 9/9 ammonia level was 51 and as of 9/13 is down to 37-will repeat in a.m. on 9/16 Continue Lactulose  TID  Normocytic anemia Hemoglobin near baseline of around 10 with current hemoglobin 10.5  Constipation Was found to have large stool ball in the rectum with mild rectal wall thickening and trace adjacent stranding concerning for impaction and stercoral colitis-now having bowel movement Emesis was 2/2 constipation and UTI Lactulose  as above  Esophagitis, chronic  CT revealed marked wall thickening of the distal esophagus, esophagitis.  Infiltrative mass has not been excluded-will need to follow while in the hospital and may benefit from barium swallow once otherwise clinically improved-given advanced age and multiple medical problems not a candidate for aggressive treatment otherwise PPI  Acute kidney injury with mild right hydronephrosis, improving  Appreciate urology assistance-recommendation is for likely need outpatient percutaneous nephrolithotomy  Creatinine has returned to normal at 0.49 with a creatinine clearance greater than 60 Avoid nephrotoxic medications and maintain blood pressure greater than 95 systolic   Type 2 DM; hypoglycemic this morning - continue Sliding scale insulin   CBG (last 3)  Recent Labs    04/02/24 0426 04/02/24 0818 04/02/24 1106   GLUCAP 77 113* 194*      Moderate protein energy malnutrition Currently receiving enteral feeds via nasogastric tube at 40 cc/h Being fed clear liquids this morning and seems to tolerate without obvious coughing but mental status is somewhat variable although she was oriented when I evaluated her We will ask for SLP evaluation for swallowing BMI 35.4 Most recent albumin  on 9/11 was low at 2.8  GOC:  Was made DNR this admission by [facility] team after discussion with PMT teams and daughter.  She is bed bound at baseline now with worsening mental status and critical illness. See respective IPAL and PMT notes.  Will optimize her but needs ongoing discussion regarding likely EOL care.       Subjective: Patient awake and occasionally slow to respond-she was essentially oriented x 3.  When I initially asked her what year it was she said 48 and then I corrected her saying that she was close but it was 2025.  She laughed and said goodness gracious you are right.  Physical Exam: Vitals:   04/01/24 2005 04/01/24 2333 04/02/24 0420 04/02/24 0726  BP: 124/75 130/67 118/77 128/76  Pulse: 98 96 95 83  Resp: 20 20 20 17   Temp: 97.6 F (36.4 C) 98 F (36.7 C) 98.3 F (36.8 C) 98 F (36.7 C)  TempSrc: Oral Oral Oral Oral  SpO2: 100% 100% 100% 100%  Weight:   87.9 kg   Height:       Constitutional: NAD, calm, comfortable Respiratory: clear to auscultation bilaterally, no wheezing, no crackles. Normal respiratory effort. No accessory muscle use.  Cardiovascular: Regular rate and rhythm, no rubs / gallops.  Noted to have systolic murmur left sternal border adjacent to sternum second intercostal space no extremity edema. 2+ pedal pulses. Abdomen: no tenderness, no masses palpated. No hepatosplenomegaly. Bowel sounds positive.  Small nasogastric tube in place with tube feedings infusing at 40 cc/h Musculoskeletal: no clubbing / cyanosis. No joint deformity upper and lower extremities no  contractures.  Diminished muscle tone.  Heel protectors in place bilaterally Skin: no rashes, lesions, ulcers. No induration Neurologic: CN 2-12 grossly intact. Sensation intact,  Strength 2-3/5 x all 4 extremities.  Psychiatric: Alert and oriented x 3. Normal mood.    Data Reviewed:  Sodium 145, potassium low at 2.3, glucose 93, BUN 18, creatinine 0.49, GFR greater than 60, magnesium  1.8  WBC 6300 differential not obtained, hemoglobin 10.5, platelets 169,000  Family Communication: No family at bedside  Disposition: Inpatient Remains inpatient appropriate because: Continues to require higher level of care not available in the home setting including enteral nutrition, telemetry monitoring in the context of recurrent hypokalemia and recent atrial fibrillation, continued monitoring of electrolytes in context of recurrent hypokalemia; also needs inpatient swallow evaluation to determine if can transition to full oral diet at some point.  Planned Discharge Destination: Home with Home Health-at baseline was bedbound-requires 3 person assistance for [facility] due to chronic incontinence and requires a Nurse, adult.  Recommendation from PT and OT is for skilled nursing facility and at this time requires continued skilled inpatient follow-up therapy less than 3 hours/day    Time spent: 55 minutes  Author: Isaiah Lever, NP 04/02/2024 8:02 AM  For on call review [email or web address].    Addendum Patient seen and examined, discussed with Ms. Lever.  Briefly, 89 year old female admitted to ICU on 9/10 with septic shock secondary to UTI, on vasopressors.  Found  to have AKI, creatinine 1.5 with some hydronephrosis can due to right staghorn stone nausea and emesis, severe constipation. Transferred to the floor, TRH assumed care on 9/15.  Physical Exam General: Currently awake, alert, NAD, frail and ill-appearing, cortrack + Cardiovascular: S1 S2 clear, RRR.  Respiratory: Decreased breath sounds at the  bases, no wheezing Gastrointestinal: Soft, nontender, nondistended, NBS Ext: + pedal edema bilaterally Neuro: Does not follow commands  Sepsis secondary to UTI, staghorn nephrolithiasis, complicated UTI -Sepsis physiology resolving, 7 days antibiotics - Per urology, stone will need outpatient percutaneous nephrolithotomy  Severe hypokalemia -K2.3, continue 40 mEq twice daily, follow BMET in am.   Severe constipation, rectal ball, chronic esophagitis  - Continue lactulose , PPI  DNR, palliative medicine following, ongoing GOC   Toni [name] M.D.  [facility] 04/02/2024, 10:55 AM

## 2024-04-02 NOTE — Plan of Care (Signed)
   Problem: Clinical Measurements: Goal: Will remain free from infection Outcome: Progressing   Problem: Clinical Measurements: Goal: Diagnostic test results will improve Outcome: Progressing   Problem: Clinical Measurements: Goal: Respiratory complications will improve Outcome: Progressing   Problem: Clinical Measurements: Goal: Cardiovascular complication will be avoided Outcome: Progressing

## 2024-04-02 NOTE — Progress Notes (Signed)
 Pt had 7 runs of vtachs. RN went in the room and assess. Patient was asymptomatic. PCCM on call notified.

## 2024-04-02 NOTE — Progress Notes (Signed)
 Glucose readings are improving. SSI was resumed 7/14-if trend continues upwards can consider low dose insulin  glargine.

## 2024-04-02 NOTE — Evaluation (Signed)
 Clinical/Bedside Swallow Evaluation Patient Details  Name: Toni [name] MRN: [id number] Date of Birth: 20-Nov-1932  Today's Date: 04/02/2024 Time: SLP Start Time (ACUTE ONLY): 1632 SLP Stop Time (ACUTE ONLY): 1640 SLP Time Calculation (min) (ACUTE ONLY): 8 min  Past Medical History:  Past Medical History:  Diagnosis Date   Diabetes mellitus without complication (HCC)    Hypertension    Peripheral vascular disease (HCC)    Plantar fascial fibromatosis    Past Surgical History:  Past Surgical History:  Procedure Laterality Date   ABDOMINAL HYSTERECTOMY     partial   ECTOPIC PREGNANCY SURGERY     JOINT REPLACEMENT Bilateral    TKR   ORIF FEMUR FRACTURE Left 03/25/2022   Procedure: OPEN REDUCTION INTERNAL FIXATION LEFT FEMUR;  Surgeon: Celena Sharper, MD;  Location: [facility] OR;  Service: Orthopedics;  Laterality: Left;   TEE WITHOUT CARDIOVERSION N/A 09/07/2016   Procedure: TRANSESOPHAGEAL ECHOCARDIOGRAM (TEE);  Surgeon: Annabella Scarce, MD;  [facility] ENDOSCOPY;  Service: Cardiovascular;  Laterality: N/A;   HPI:  Toni [name] is an 89 yo female with PMH including HFpEF, T2DM, HTN, obesity, and cirrhosis presenting to ED 9/9 with coffee ground emesis, hypotension, and weakness. Admitted with sepsis 2/2 UTI and AKI with mild R hydronephrosis.    Assessment / Plan / Recommendation  Clinical Impression  Pt was sitting upright in bed, drowsy but able to attend briefly to functional tasks. She took small sips of water  without signs clinically concerning for aspiration but could not be challenged with larger volumes. Oral transit was more functional-appearing with purees than with solids. She was generally unaware of presentations of solids and did not form a labial seal, resulting in prolonged mastication and suspected reduced cohesion. A clear liquid diet was initiated previous date without overt concerns, though MD reports frequent fluctuations in mentation so recommend proceeding more  conservatively initially. Will advance to full liquids and f/u for ongoing assessment. SLP Visit Diagnosis: Dysphagia, unspecified (R13.10)    Aspiration Risk  Mild aspiration risk    Diet Recommendation  (full liquid diet)    Liquid Administration via: Spoon;Cup;Straw Medication Administration: Whole meds with liquid Supervision: Staff to assist with self feeding;Full supervision/cueing for compensatory strategies Compensations: Slow rate;Small sips/bites Postural Changes: Seated upright at 90 degrees;Remain upright for at least 30 minutes after po intake    Other  Recommendations Oral Care Recommendations: Oral care BID     Assistance Recommended at Discharge    Functional Status Assessment Patient has had a recent decline in their functional status and demonstrates the ability to make significant improvements in function in a reasonable and predictable amount of time.  Frequency and Duration min 2x/week  2 weeks       Prognosis Prognosis for improved oropharyngeal function: Good Barriers to Reach Goals: Cognitive deficits      Swallow Study   General HPI: Toni [name] is an 89 yo female with PMH including HFpEF, T2DM, HTN, obesity, and cirrhosis presenting to ED 9/9 with coffee ground emesis, hypotension, and weakness. Admitted with sepsis 2/2 UTI and AKI with mild R hydronephrosis. Type of Study: Bedside Swallow Evaluation Previous Swallow Assessment: none in chart Diet Prior to this Study: Clear liquid diet;Cortrak/Small bore NG tube Temperature Spikes Noted: No Respiratory Status: Room air History of Recent Intubation: No Behavior/Cognition: Alert;Cooperative Oral Cavity Assessment: Within Functional Limits Oral Care Completed by SLP: No Oral Cavity - Dentition: Adequate natural dentition Vision: Functional for self-feeding Self-Feeding Abilities: Needs assist Patient Positioning: Upright  in bed Baseline Vocal Quality: Normal Volitional Cough: Cognitively unable  to elicit Volitional Swallow: Able to elicit    Oral/Motor/Sensory Function Overall Oral Motor/Sensory Function: Within functional limits   Ice Chips Ice chips: Not tested   Thin Liquid Thin Liquid: Within functional limits Presentation: Straw    Nectar Thick Nectar Thick Liquid: Not tested   Honey Thick Honey Thick Liquid: Not tested   Puree Puree: Within functional limits Presentation: Spoon   Solid     Solid: Impaired Oral Phase Impairments: Reduced labial seal;Poor awareness of bolus Oral Phase Functional Implications: Prolonged oral transit      Damien Blumenthal, M.A., CCC-SLP Speech Language Pathology, Acute Rehabilitation Services  Secure Chat preferred [phone]  04/02/2024,5:17 PM

## 2024-04-02 NOTE — Progress Notes (Addendum)
 Noted bigeminy on tele for 7 beats  Denied symptoms  V/S are stable Lungs: clear. On 4 L [place] Labs are reviewed: K 2.3, Mg 1.8 Ordered KCL 60 meq IV and Mg 2 IV Repeat labs 1300 today

## 2024-04-03 DIAGNOSIS — I4891 Unspecified atrial fibrillation: Secondary | ICD-10-CM

## 2024-04-03 DIAGNOSIS — K5641 Fecal impaction: Secondary | ICD-10-CM | POA: Diagnosis not present

## 2024-04-03 DIAGNOSIS — R69 Illness, unspecified: Secondary | ICD-10-CM

## 2024-04-03 DIAGNOSIS — E440 Moderate protein-calorie malnutrition: Secondary | ICD-10-CM | POA: Diagnosis not present

## 2024-04-03 DIAGNOSIS — G9341 Metabolic encephalopathy: Secondary | ICD-10-CM | POA: Diagnosis not present

## 2024-04-03 DIAGNOSIS — Z515 Encounter for palliative care: Secondary | ICD-10-CM | POA: Diagnosis not present

## 2024-04-03 DIAGNOSIS — A419 Sepsis, unspecified organism: Secondary | ICD-10-CM | POA: Diagnosis not present

## 2024-04-03 LAB — COMPREHENSIVE METABOLIC PANEL WITH GFR
ALT: 19 U/L (ref 0–44)
AST: 15 U/L (ref 15–41)
Albumin: 2 g/dL — ABNORMAL LOW (ref 3.5–5.0)
Alkaline Phosphatase: 65 U/L (ref 38–126)
Anion gap: 9 (ref 5–15)
BUN: 14 mg/dL (ref 8–23)
CO2: 25 mmol/L (ref 22–32)
Calcium: 8.6 mg/dL — ABNORMAL LOW (ref 8.9–10.3)
Chloride: 110 mmol/L (ref 98–111)
Creatinine, Ser: 0.37 mg/dL — ABNORMAL LOW (ref 0.44–1.00)
GFR, Estimated: >60 mL/min (ref >60)
Glucose, Bld: 196 mg/dL — ABNORMAL HIGH (ref 70–99)
Potassium: 4.5 mmol/L (ref 3.5–5.1)
Sodium: 144 mmol/L (ref 135–145)
Total Bilirubin: 0.3 mg/dL (ref 0.0–1.2)
Total Protein: 5.5 g/dL — ABNORMAL LOW (ref 6.5–8.1)

## 2024-04-03 LAB — GLUCOSE, CAPILLARY
Glucose-Capillary: 187 mg/dL — ABNORMAL HIGH (ref 70–99)
Glucose-Capillary: 166 mg/dL — ABNORMAL HIGH (ref 70–99)
Glucose-Capillary: 180 mg/dL — ABNORMAL HIGH (ref 70–99)
Glucose-Capillary: 268 mg/dL — ABNORMAL HIGH (ref 70–99)
Glucose-Capillary: 221 mg/dL — ABNORMAL HIGH (ref 70–99)

## 2024-04-03 LAB — AMMONIA: Ammonia: 44 umol/L — ABNORMAL HIGH (ref 9–35)

## 2024-04-03 MED ORDER — LACTATED RINGERS IV BOLUS: 500.0000 mL | Freq: Once | INTRAVENOUS | Status: DC

## 2024-04-03 MED ORDER — INSULIN GLARGINE 100 UNIT/ML ~~LOC~~ SOLN
5.0000 [IU] | Freq: Every day | SUBCUTANEOUS | Status: DC
  Administered 2024-04-03 – 2024-04-08 (×6): 5 [IU] via SUBCUTANEOUS
  Filled 2024-04-03 (×7): qty 0.05

## 2024-04-03 MED ORDER — INSULIN ASPART 100 UNIT/ML IJ SOLN: 8.0000 [IU] | INTRAMUSCULAR | Status: DC

## 2024-04-03 MED ORDER — MORPHINE SULFATE (PF) 2 MG/ML IV SOLN
1.0000 mg | INTRAVENOUS | Status: DC | PRN
  Administered 2024-04-03 – 2024-04-16 (×8): 1 mg via INTRAVENOUS
  Filled 2024-04-03 (×10): qty 1

## 2024-04-03 NOTE — Progress Notes (Signed)
 [facility]                                                                              Toni [name], is a 89 y.o. female, DOB - 05-Oct-1932, FMW:[id number] Admit date - 03/27/2024    Outpatient Primary MD for the patient is Patient, No Pcp Per  LOS - 6  days  Chief Complaint  Patient presents with   Code Sepsis       Brief summary  89 y.o. female  with past medical history of chronic HFpEF, diabetes type 2, peripheral artery disease, cirrhosis, and HTN who presented to the ED on 03/27/2024 with nausea/vomiting, lethargy, and weakness. She is typically alert but was progressively confused over the course of the day as well. All history is obtained from chart 2/2 pt's acute encephalopathy. Pt has not had a bowel movement for 5 days and has been suffering from abdominal distention and pain at presentation. She is admitted with sepsis secondary to UTI and colitis, AKI, lactic acidosis, metabolic acidosis, acute metabolic encephalopathy, and new onset A-fib. Pt reportedly has frequent uti's in past. UA pending. Lactate elevated on presentation but improving to 3 with volume. Ultimately, she required initiation of norepi to maintain BP. Found to be in aki with Cr 1.5 (baseline 0.7) with some hydronephrosis 2/2 R staghorn stone. There is also stigmata of espophagitis and colitis.    Palliative Medicine has been consulted for goals of care discussions.  Significant Hospital Events:   Admitted to icu 9/10 on norepi 9/11 more altered this AM, but off pressors; IPAL yesterday and PMT seen - decision to make DNR-L 9/12: pending ongoing GOC discussion  9/15 transferred to the floor, TRH assumes care    Assessment & Plan    Sepsis 2/2 UTI- likely pyelonephritis and colitis; shock resolved E. Coli & Proteus mirabilis UTI -E. coli resistant to ampicillin and Proteus resistant to Macrodantin, has been de-escalated to ceftriaxone  based on susceptibilities -Continue midodrine  5 mg 3  times daily, wean off as tolerated - Was found to have staghorn calculus in the right kidney without any obstructive processes although did have mild right hydronephrosis and dilatation of the proximal right ureter on ultrasound and CT imaging   Chronic HFpEF  -ECHO  EF 70-75%, hyperdynamic, severe MR calcifications with associated murmur -Currently no acute issues, follow closely for any volume overload,   Acute hypokalemia -Recurrent problem, continue daily placement    Atrial fibrillation, new-onset -Now in NSR, likely due to septic shock  - CHA2DS2-VASc 2: 6-if A-fib returns, will need GOC regarding anticoagulation   Cirrhosis/Hyperammonemia -Noted on CT abd/pelvis -Continue lactulose , fairly alert and oriented today, no need for daily ammonia level  Normocytic anemia H&H stable   Constipation -Found to have large stool ball in the rectum with mild rectal wall thickening, stercoral colitis -Now having BM, continue lactulose  as above    Esophagitis, chronic  CT revealed marked wall thickening of the distal esophagus, esophagitis.  Infiltrative mass has not been excluded - No GI bleed.  Palliative medicine following for GOC - Per palliative medicine, family wishes to continue supportive care while hoping for improvement.  Given advanced age and multiple medical problems not a candidate for aggressive treatment otherwise -Continue PPI   Acute kidney injury with mild right hydronephrosis, improving  -Seen by urology, Dr. Renda on 9/10, recommended outpatient follow-up once patient is optimized for consideration of PCNL.   - Creatinine stable 0.3    Diabetes mellitus type 2 CBG (last 3)  Recent Labs    04/03/24 0423 04/03/24 0829 04/03/24 1129  GLUCAP 187* 166* 180*   Continue moderate SSI every 4 hours,  added basal insulin  Lantus  5 units daily   Moderate protein energy malnutrition -Continue tube feeds, SLP 9/15-> full liquid diet -Speech therapy following, advance  as tolerated Signs/Symptoms: mild muscle depletion, energy intake < or equal to 50% for > or equal to 5 days  Obesity class II Estimated body mass index is 38.39 kg/m as calculated from the following:   Height as of this encounter: 5' 2 (1.575 m).   Weight as of this encounter: 95.2 kg.  Code Status: DNR DVT Prophylaxis:  heparin  injection 5,000 Units Start: 03/28/24 0600 SCDs Start: 03/28/24 0230   Level of Care: Level of care: Progressive Family Communication:  Disposition Plan:      Remains inpatient appropriate:      Procedures:   Consultants:   Palliative medicine CCM Urology  Antimicrobials:   Anti-infectives (From admission, onward)    Start     Dose/Rate Route Frequency Ordered Stop   04/01/24 1600  cefTRIAXone  (ROCEPHIN ) 2 g in sodium chloride  0.9 % 100 mL IVPB        2 g 200 mL/hr over 30 Minutes Intravenous Every 24 hours 04/01/24 1159 04/04/24 1559   03/29/24 2200  vancomycin  (VANCOCIN ) IVPB 1000 mg/200 mL premix  Status:  Discontinued        1,000 mg 200 mL/hr over 60 Minutes Intravenous Every 48 hours 03/28/24 0443 03/29/24 0639   03/28/24 0600  piperacillin -tazobactam (ZOSYN ) IVPB 3.375 g  Status:  Discontinued        3.375 g 12.5 mL/hr over 240 Minutes Intravenous Every 8 hours 03/28/24 0442 04/01/24 1159   03/27/24 2100  ceFEPIme  (MAXIPIME ) 2 g in sodium chloride  0.9 % 100 mL IVPB        2 g 200 mL/hr over 30 Minutes Intravenous  Once 03/27/24 2050 03/27/24 2151   03/27/24 2100  metroNIDAZOLE  (FLAGYL ) IVPB 500 mg        500 mg 100 mL/hr over 60 Minutes Intravenous  Once 03/27/24 2050 03/27/24 2255   03/27/24 2100  vancomycin  (VANCOCIN ) IVPB 1000 mg/200 mL premix        1,000 mg 200 mL/hr over 60 Minutes Intravenous  Once 03/27/24 2050 03/28/24 0011          Medications  Chlorhexidine  Gluconate Cloth  6 each Topical Daily   heparin   5,000 Units Subcutaneous Q8H   insulin  aspart  0-15 Units Subcutaneous Q4H   lactulose   30 g Per Tube TID    metoCLOPramide  (REGLAN ) injection  10 mg Intravenous Q8H   midodrine   5 mg Per Tube Q8H   mouth rinse  15 mL Mouth Rinse 4 times per day   pantoprazole  (PROTONIX ) IV  40 mg Intravenous Daily   potassium chloride   40 mEq Per Tube BID      Subjective:   Toni [name] was seen and examined today.  Much more alert and awake today, following commands one-word responses to orientation questions.  Overnight no acute issues  Objective:   Vitals:   04/02/24  2359 04/03/24 0421 04/03/24 0730 04/03/24 1130  BP:  101/60 (!) 106/53 114/63  Pulse: (!) 101 93 86 87  Resp: 19 20 20 20   Temp:  99.2 F (37.3 C) 98.2 F (36.8 C) 98.1 F (36.7 C)  TempSrc:  Oral Oral Oral  SpO2: 96% 98% 98% 100%  Weight:  95.2 kg    Height:        Intake/Output Summary (Last 24 hours) at 04/03/2024 1336 Last data filed at 04/03/2024 0900 Gross per 24 hour  Intake 798.76 ml  Output 900 ml  Net -101.24 ml     Wt Readings from Last 3 Encounters:  04/03/24 95.2 kg  06/11/22 115 kg  03/25/22 115.6 kg     Exam General: Alert and oriented x 3, NAD, appears comfortable Cardiovascular: S1 S2 auscultated,  RRR, systolic murmur Respiratory: Clear to auscultation bilaterally, no wheezing, rales or rhonchi Gastrointestinal: Soft, nontender, nondistended, + bowel sounds Ext: no pedal edema bilaterally Neuro: No new deficits psych: Normal    Data Reviewed:  I have personally reviewed following labs    CBC Lab Results  Component Value Date   WBC 6.3 04/02/2024   RBC 3.85 (L) 04/02/2024   HGB 10.5 (L) 04/02/2024   HCT 35.6 (L) 04/02/2024   MCV 92.5 04/02/2024   MCH 27.3 04/02/2024   PLT 169 04/02/2024   MCHC 29.5 (L) 04/02/2024   RDW 14.0 04/02/2024   LYMPHSABS 2.3 03/27/2024   MONOABS 1.0 03/27/2024   EOSABS 0.0 03/27/2024   BASOSABS 0.0 03/27/2024     Last metabolic panel Lab Results  Component Value Date   NA 144 04/03/2024   K 4.5 04/03/2024   CL 110 04/03/2024   CO2 25 04/03/2024    BUN 14 04/03/2024   CREATININE 0.37 (L) 04/03/2024   GLUCOSE 196 (H) 04/03/2024   GFRNONAA >60 04/03/2024   GFRAA >60 09/14/2016   CALCIUM 8.6 (L) 04/03/2024   PHOS 1.5 (L) 04/02/2024   PROT 5.5 (L) 04/03/2024   ALBUMIN  2.0 (L) 04/03/2024   BILITOT 0.3 04/03/2024   ALKPHOS 65 04/03/2024   AST 15 04/03/2024   ALT 19 04/03/2024   ANIONGAP 9 04/03/2024    CBG (last 3)  Recent Labs    04/03/24 0423 04/03/24 0829 04/03/24 1129  GLUCAP 187* 166* 180*      Coagulation Profile: No results for input(s): INR, PROTIME in the last 168 hours.   Radiology Studies: I have personally reviewed the imaging studies  No results found.     Toni [name] M.D. [facility] 04/03/2024, 1:36 PM  Available via Epic secure chat 7am-7pm After 7 pm, please refer to night coverage provider listed on amion.

## 2024-04-03 NOTE — Plan of Care (Signed)
  Problem: Metabolic: Goal: Ability to maintain appropriate glucose levels will improve Outcome: Progressing   Problem: Nutritional: Goal: Maintenance of adequate nutrition will improve Outcome: Progressing   Problem: Clinical Measurements: Goal: Ability to maintain clinical measurements within normal limits will improve Outcome: Progressing

## 2024-04-03 NOTE — Progress Notes (Signed)
 Palliative Medicine Progress Note   Patient Name: SERA HITSMAN       Date: 04/03/2024 DOB: 05/22/1933  Age: 89 y.o. MRN#: [id number] Attending Physician: Davia Nydia POUR, MD Primary Care Physician: Patient, No Pcp Per Admit Date: 03/27/2024  Reason for Consultation/Follow-up: {Reason for Consult:23484}  HPI/Patient Profile: 89 y.o. female  with past medical history of chronic HFpEF, diabetes type 2, peripheral artery disease, cirrhosis, and HTN who presented to the ED on 03/27/2024 with nausea/vomiting, lethargy, and weakness.  She is admitted with sepsis secondary to UTI and colitis, AKI, lactic acidosis, metabolic acidosis, acute metabolic encephalopathy, and new onset A-fib.   Palliative Medicine has been consulted for goals of care discussions. Patient and family are faced with anticipatory care needs and complex medical decision making.   Subjective: Chart reviewed. Per RT note, patient was placed on BiPAP overnight.   Patient assessed. She is moaning and stating I can't take it anymore. She reports being in pain all over.   Objective:  Physical Exam Vitals reviewed.  Constitutional:      Comments: Frail and chronically ill-appearing  HENT:     Head:     Comments: Cortrak in place Pulmonary:     Effort: Pulmonary effort is normal.  Neurological:     Mental Status: She is alert.             Vital Signs: BP (!) 106/53 (BP Location: Left Wrist)   Pulse 86   Temp 98.2 F (36.8 C) (Oral)   Resp 20   Ht 5' 2 (1.575 m)   Wt 95.2 kg   SpO2 98%   BMI 38.39 kg/m  SpO2: SpO2: 98 % O2 Device: O2 Device: Room Air O2 Flow Rate: O2 Flow Rate (L/min): 2 L/min  Intake/output summary:  Intake/Output Summary (Last 24 hours) at 04/03/2024 1006 Last data filed at 04/03/2024  0900 Gross per 24 hour  Intake 1156.38 ml  Output 1250 ml  Net -93.62 ml    LBM: Last BM Date : 04/02/24     Palliative Assessment/Data: ***     Palliative Medicine Assessment & Plan   Assessment: Principal Problem:   Septic shock (HCC) Active Problems:   Malnutrition of moderate degree    Recommendations/Plan: DNR with limited interventions Continue supportive interventions with the goal for medically optimized Disposition plan  is SNF/rehab to improve functional status, then return home Encouraged daughter to consider home hospice support PMT will continue to follow    Goals of Care and Additional Recommendations: Limitations on Scope of Treatment: {Recommended Scope and Preferences:21019}  Code Status:   Prognosis:  {Palliative Care Prognosis:23504}  Discharge Planning: {Palliative dispostion:23505}  Care plan was discussed with ***  Thank you for allowing the Palliative Medicine Team to assist in the care of this patient.   ***   Recardo KATHEE Loll, NP   Please contact Palliative Medicine Team phone at [phone] for questions and concerns.  For individual providers, please see AMION.

## 2024-04-03 NOTE — Progress Notes (Signed)
 Physical Therapy Treatment Patient Details Name: Toni [name] MRN: [id number] DOB: [birth year] Today's Date: 04/03/2024   History of Present Illness 89 y/o woman who presented 9/9 with confusion, poor appetite and PO intake, found to have urosepsis. Acute metabolic encephalopathy. PMH: bedbound after multiple leg surgeries, HTN, DM2    PT Comments  The pt was in bed upon arrival, upon initiation of moving pt bed noted to be soiled, therefore additional assistance was sought to assist with rolling and bed mobility. Pt needing totalA to roll to either side but is able to maintain sidelying with use of rails. Significant pain noted with all rolling and repositioning, pt unable to clarify where or how much pain, but RR as high as 42 with rolling while supine. The pt did not follow commands for exercises or ROM this session, tolerated PROM with some grimacing and moaning. Further OOB transfers and mobility deferred due to lack of command following at this time. Pt did perk up with arrival of daughter and answer x2 questions with yes/no answers. Making slow but steady progress, will continue to follow acutely.    If plan is discharge home, recommend the following: Two people to help with walking and/or transfers;Two people to help with bathing/dressing/bathroom;Assistance with cooking/housework;Direct supervision/assist for medications management;Direct supervision/assist for financial management;Assist for transportation;Supervision due to cognitive status   Can travel by private vehicle     No  Equipment Recommendations  None recommended by PT (TBD once current equipment PTA is confirmed.)    Recommendations for Other Services       Precautions / Restrictions Precautions Precautions: Fall Recall of Precautions/Restrictions: Impaired Precaution/Restrictions Comments: incontinence of stool, watch RR in supine, cortrak Restrictions Weight Bearing Restrictions Per Provider Order: No      Mobility  Bed Mobility Overal bed mobility: Needs Assistance Bed Mobility: Rolling Rolling: +2 for physical assistance, Used rails, Total assist         General bed mobility comments: pt needing totalA to roll to either side but is able to maintain sidelying with use of rails. significant pain with movement. no command following by pt to assist with roll    Transfers                   General transfer comment: deferred due to pain, incontinence of stool, and lack of command following         Communication Communication Communication: Impaired Factors Affecting Communication: Reduced clarity of speech  Cognition Arousal: Alert Behavior During Therapy: Flat affect   PT - Cognitive impairments: No family/caregiver present to determine baseline                       PT - Cognition Comments: no attempt to communicate in session other than moaning in pain with mobility. did not follow commands for LE movements, does visually track therapist. when daughter arrived, pt answered x2 questions with yes/no answers Following commands: Impaired Following commands impaired: Follows one step commands inconsistently, Follows one step commands with increased time    Cueing Cueing Techniques: Verbal cues, Gestural cues, Tactile cues  Exercises General Exercises - Lower Extremity Ankle Circles/Pumps: PROM, Both, 5 reps Heel Slides: PROM, Both, 5 reps, Supine Hip ABduction/ADduction: PROM, Both, 5 reps, Supine    General Comments General comments (skin integrity, edema, etc.): RR of 42 with supine for rolling, SpO2 97% on RA but NT placed pt on 2L for comfort      Pertinent Vitals/Pain Pain Assessment Pain  Assessment: Faces Faces Pain Scale: Hurts even more Pain Location: pt unable to indicate, occurs with rolling or repositioning Pain Descriptors / Indicators: Discomfort, Grimacing, Moaning Pain Intervention(s): Limited activity within patient's tolerance, Monitored  during session, Repositioned     PT Goals (current goals can now be found in the care plan section) Acute Rehab PT Goals Patient Stated Goal: none stated PT Goal Formulation: Patient unable to participate in goal setting Time For Goal Achievement: 04/13/24 Potential to Achieve Goals: Poor Progress towards PT goals: Progressing toward goals    Frequency    Min 1X/week       AM-PAC PT 6 Clicks Mobility   Outcome Measure  Help needed turning from your back to your side while in a flat bed without using bedrails?: Total Help needed moving from lying on your back to sitting on the side of a flat bed without using bedrails?: Total Help needed moving to and from a bed to a chair (including a wheelchair)?: Total Help needed standing up from a chair using your arms (e.g., wheelchair or bedside chair)?: Total Help needed to walk in hospital room?: Total Help needed climbing 3-5 steps with a railing? : Total 6 Click Score: 6    End of Session   Activity Tolerance: Other (comment);Patient limited by pain (Frequent liquid stool incontinence) Patient left: in bed;with call bell/phone within reach;with bed alarm set;with family/visitor present Nurse Communication: Mobility status;Other (comment) (wounds on L upper thigh, need to resume cortrak) PT Visit Diagnosis: Other abnormalities of gait and mobility (R26.89);Muscle weakness (generalized) (M62.81);Other symptoms and signs involving the nervous system (R29.898);Pain Pain - part of body:  (peri area)     Time: 8549-8474 PT Time Calculation (min) (ACUTE ONLY): 35 min  Charges:    $Therapeutic Exercise: 8-22 mins $Therapeutic Activity: 8-22 mins PT General Charges $$ ACUTE PT VISIT: 1 Visit                     Izetta Call, PT, DPT   Acute Rehabilitation Department Office [phone] Secure Chat Communication Preferred   Izetta JULIANNA Call 04/03/2024, 3:59 PM

## 2024-04-03 NOTE — Progress Notes (Signed)
 Placed patient on bipap with IPAP=12 and EPAP=5

## 2024-04-03 NOTE — Plan of Care (Signed)
  Problem: Skin Integrity: Goal: Risk for impaired skin integrity will decrease Outcome: Progressing   Problem: Nutrition: Goal: Adequate nutrition will be maintained Outcome: Progressing   Problem: Clinical Measurements: Goal: Will remain free from infection Outcome: Progressing

## 2024-04-04 DIAGNOSIS — A419 Sepsis, unspecified organism: Secondary | ICD-10-CM | POA: Diagnosis not present

## 2024-04-04 DIAGNOSIS — I4891 Unspecified atrial fibrillation: Secondary | ICD-10-CM | POA: Diagnosis not present

## 2024-04-04 DIAGNOSIS — G9341 Metabolic encephalopathy: Secondary | ICD-10-CM | POA: Diagnosis not present

## 2024-04-04 DIAGNOSIS — R6521 Severe sepsis with septic shock: Secondary | ICD-10-CM | POA: Diagnosis not present

## 2024-04-04 LAB — GLUCOSE, CAPILLARY
Glucose-Capillary: 234 mg/dL — ABNORMAL HIGH (ref 70–99)
Glucose-Capillary: 90 mg/dL (ref 70–99)
Glucose-Capillary: 193 mg/dL — ABNORMAL HIGH (ref 70–99)
Glucose-Capillary: 187 mg/dL — ABNORMAL HIGH (ref 70–99)
Glucose-Capillary: 265 mg/dL — ABNORMAL HIGH (ref 70–99)
Glucose-Capillary: 288 mg/dL — ABNORMAL HIGH (ref 70–99)

## 2024-04-04 LAB — MAGNESIUM: Magnesium: 1.8 mg/dL (ref 1.7–2.4)

## 2024-04-04 LAB — PHOSPHORUS: Phosphorus: 1.3 mg/dL — ABNORMAL LOW (ref 2.5–4.6)

## 2024-04-04 MED ORDER — K PHOS MONO-SOD PHOS DI & MONO 155-852-130 MG PO TABS
500.0000 mg | ORAL_TABLET | Freq: Two times a day (BID) | ORAL | Status: DC
  Administered 2024-04-04 – 2024-04-15 (×23): 500 mg
  Filled 2024-04-04 (×22): qty 2

## 2024-04-04 NOTE — Plan of Care (Signed)
  Problem: Nutritional: Goal: Maintenance of adequate nutrition will improve Outcome: Progressing   Problem: Clinical Measurements: Goal: Respiratory complications will improve Outcome: Progressing

## 2024-04-04 NOTE — Progress Notes (Signed)
 Speech Language Pathology Treatment: Dysphagia  Patient Details Name: Toni [name] MRN: [id number] DOB: 1933/06/24 Today's Date: 04/04/2024 Time: 8467-8455 SLP Time Calculation (min) (ACUTE ONLY): 12 min  Assessment / Plan / Recommendation Clinical Impression  Pt more alert today, reports she feels better compared to initial evaluation 9/15. When prompted, she held the cup to take sequential straw sips of juice without overt s/s of aspiration. She was more attentive to mastication with solids and cleared her oral cavity completely. Recommend advancing to Dys 3 solids and thin liquids with full supervision to assist with feeding as needed and provide cueing for initiation. SLP will continue following.    HPI HPI: Toni [name] is an 89 yo female with PMH including HFpEF, T2DM, HTN, obesity, and cirrhosis presenting to ED 9/9 with coffee ground emesis, hypotension, and weakness. Admitted with sepsis 2/2 UTI and AKI with mild R hydronephrosis.      SLP Plan  Continue with current plan of care          Recommendations  Diet recommendations: Dysphagia 3 (mechanical soft);Thin liquid Liquids provided via: Cup;Straw Medication Administration: Whole meds with liquid Supervision: Staff to assist with self feeding;Full supervision/cueing for compensatory strategies;Trained caregiver to feed patient Compensations: Minimize environmental distractions;Slow rate;Small sips/bites Postural Changes and/or Swallow Maneuvers: Seated upright 90 degrees                  Oral care BID   Frequent or constant Supervision/Assistance Dysphagia, unspecified (R13.10)     Continue with current plan of care     Damien Blumenthal, M.A., CCC-SLP Speech Language Pathology, Acute Rehabilitation Services  Secure Chat preferred [phone]   04/04/2024, 3:51 PM

## 2024-04-04 NOTE — Plan of Care (Signed)
  Problem: Nutritional: Goal: Maintenance of adequate nutrition will improve Outcome: Progressing   Problem: Clinical Measurements: Goal: Respiratory complications will improve Outcome: Progressing   Problem: Safety: Goal: Ability to remain free from injury will improve Outcome: Progressing

## 2024-04-04 NOTE — TOC Progression Note (Signed)
 Transition of Care [facility]) - Progression Note    Patient Details  Name: Toni [name] MRN: [id number] Date of Birth: 1932-08-18  Transition of [facility]) CM/SW Contact  Whitfield Campanile, Student-Social Work Phone Number: 04/04/2024, 11:55 AM  Clinical Narrative:     CSW inquired with speech and registered dietician about potential Cortrak removal for patient to be discharge to SNF. Per speech and registered dietitian, cortrak can be removed once patient's diet has been advanced and intake increased.   Whitfield Campanile, MSW Intern  Expected Discharge Plan: Skilled Nursing Facility Barriers to Discharge: Continued Medical Work up               Expected Discharge Plan and Services In-house Referral: Clinical Social Work     Living arrangements for the past 2 months: Single Family Home                                       Social Drivers of Health (SDOH) Interventions SDOH Screenings   Food Insecurity: Patient Unable To Answer (03/30/2024)  Housing: Patient Unable To Answer (03/30/2024)  Transportation Needs: Patient Unable To Answer (03/30/2024)  Utilities: Patient Unable To Answer (03/30/2024)  Social Connections: Patient Unable To Answer (03/30/2024)  Tobacco Use: Low Risk  (03/27/2024)    Readmission Risk Interventions     No data to display

## 2024-04-04 NOTE — Progress Notes (Signed)
 OT NOTE  OT calling daughter to clearly establish PTA levels of indep and caregiver support. Pt is from home with 24/7 caregiver support. Pt was completely bedbound and did not hoyer lift oob due to not liking the lift. Pt did self feed meals and applied of lotion ( grooming task) to arms. All bathing dressing and toileting needs were dependent on caregiver support.  Pt with hospital bed and foam mattress overlay. Pt could control the TV and complete word searches. Pt's family wishes for patient to self feed again and hold conversations. Daughter reports speech is slower and does not attempt to have a conversation. Pt fatigued and not initiating self feeding with RN staff this date so will attempt at a more appropriate time.   [place], OTR/L  Acute Rehabilitation Services Office: [phone] .

## 2024-04-04 NOTE — Progress Notes (Signed)
 Patient ate 3 bites of pudding and two spoonfuls of soup before shaking her head no and asking me to stop feeding her.

## 2024-04-04 NOTE — Progress Notes (Signed)
 PT placed on BIPAP at this time.   04/04/24 2149  BiPAP/CPAP/SIPAP  BiPAP/CPAP/SIPAP Pt Type Adult  BiPAP/CPAP/SIPAP Resmed  Reason BIPAP/CPAP not in use Other(comment)  Mask Type Full face mask  Dentures removed? Not applicable  Mask Size Medium  Set Rate 0 breaths/min  Respiratory Rate 18 breaths/min  IPAP 12 cmH20  EPAP 5 cmH2O  Pressure Support 12 cmH20  PEEP 5 cmH20  FiO2 (%) 30 %  Flow Rate 0 lpm  Minute Ventilation 10  Leak 41  Peak Inspiratory Pressure (PIP) 17  Tidal Volume (Vt) 597  Patient Home Machine No  Patient Home Mask No  Patient Home Tubing No  Auto Titrate No  Press High Alarm 25 cmH2O  Press Low Alarm 2 cmH2O  Nasal massage performed Yes  CPAP/SIPAP surface wiped down Yes  Device Plugged into RED Power Outlet Yes  Oxygen Percent 21 %

## 2024-04-04 NOTE — Progress Notes (Signed)
 Progress Note   Patient: Toni [name] FMW:[id number] DOB: Mar 26, 1933 DOA: 03/27/2024     7 DOS: the patient was seen and examined on 04/04/2024   Brief hospital course: 89 y.o. female  with past medical history of chronic HFpEF, diabetes type 2, peripheral artery disease, cirrhosis, and HTN who presented to the ED on 03/27/2024 with nausea/vomiting, lethargy, and weakness. She is typically alert but was progressively confused over the course of the day as well. All history is obtained from chart 2/2 pt's acute encephalopathy. Pt has not had a bowel movement for 5 days and has been suffering from abdominal distention and pain at presentation. She is admitted with sepsis secondary to UTI and colitis, AKI, lactic acidosis, metabolic acidosis, acute metabolic encephalopathy, and new onset A-fib. Pt reportedly has frequent uti's in past. UA pending. Lactate elevated on presentation but improving to 3 with volume. Ultimately, she required initiation of norepi to maintain BP. Found to be in aki with Cr 1.5 (baseline 0.7) with some hydronephrosis 2/2 R staghorn stone. There is also stigmata of espophagitis and colitis.   Assessment and Plan: Sepsis 2/2 UTI- likely pyelonephritis and colitis; shock resolved E. Coli & Proteus mirabilis UTI -E. coli resistant to ampicillin and Proteus resistant to Macrodantin, has been de-escalated to ceftriaxone  based on susceptibilities -Continue midodrine  5 mg 3 times daily, wean off as tolerated - Was found to have staghorn calculus in the right kidney without any obstructive processes although did have mild right hydronephrosis and dilatation of the proximal right ureter on ultrasound and CT imaging   Chronic HFpEF  -ECHO  EF 70-75%, hyperdynamic, severe MR calcifications with associated murmur -Currently no acute issues, follow closely for any volume overload,   Acute hypokalemia -Recurrent problem, continue daily placement    Atrial fibrillation, new-onset -Now in  NSR, likely due to septic shock  - CHA2DS2-VASc 2: 6-if A-fib returns, will need GOC regarding anticoagulation   Cirrhosis/Hyperammonemia -Noted on CT abd/pelvis -Continue lactulose , alert and conversant   Normocytic anemia H&H stable   Constipation -Found to have large stool ball in the rectum with mild rectal wall thickening, stercoral colitis -Now having BM, continue lactulose  as above     Esophagitis, chronic  CT revealed marked wall thickening of the distal esophagus, esophagitis.  Infiltrative mass has not been excluded - No GI bleed.  Palliative medicine following for GOC - Per palliative medicine, family wishes to continue supportive care while hoping for improvement. Given advanced age and multiple medical problems not a candidate for aggressive treatment otherwise -Continue PPI   Acute kidney injury with mild right hydronephrosis, improving  -Seen by urology, Dr. Renda on 9/10, recommended outpatient follow-up once patient is optimized for consideration of PCNL.   - Creatinine stable 0.3     Diabetes mellitus type 2 CBG (last 3)  Recent Labs (last 2 labs)       Recent Labs    04/03/24 0423 04/03/24 0829 04/03/24 1129  GLUCAP 187* 166* 180*      Continue moderate SSI every 4 hours,  added basal insulin  Lantus  5 units daily   Moderate protein energy malnutrition -Continue tube feeds, SLP 9/15-> full liquid diet -Speech therapy following, advanced to dys 3 diet Signs/Symptoms: mild muscle depletion, energy intake < or equal to 50% for > or equal to 5 days   Obesity class II Estimated body mass index is 38.39 kg/m as calculated from the following:   Height as of this encounter: 5' 2 (1.575 m).  Weight as of this encounter: 95.2 kg.         Subjective: Without complaints this AM. Continues with coretrak TF  Physical Exam: Vitals:   04/04/24 0339 04/04/24 0500 04/04/24 0810 04/04/24 1242  BP: 95/65  124/69 131/75  Pulse: 98 93 (!) 45 93  Resp: 20  (!) 34 20 (!) 24  Temp: 97.8 F (36.6 C)  97.6 F (36.4 C) 98.1 F (36.7 C)  TempSrc: Oral  Oral Oral  SpO2: 100% 94% 100% 100%  Weight:  92.5 kg    Height:       General exam: Awake, laying in bed, in nad Respiratory system: Normal respiratory effort, no wheezing Cardiovascular system: regular rate, s1, s2 Gastrointestinal system: Soft, nondistended, positive BS Central nervous system: CN2-12 grossly intact, strength intact Extremities: Perfused, no clubbing Skin: Normal skin turgor, no notable skin lesions seen Psychiatry: Mood normal // no visual hallucinations   Data Reviewed:  Labs reviewed: Phos 1.3  Family Communication: Pt in room, family not at bedside  Disposition: Status is: Inpatient Remains inpatient appropriate because: severity of illness  Planned Discharge Destination: Skilled nursing facility    Author: Garnette Pelt, MD 04/04/2024 4:52 PM  For on call review [email or web address].

## 2024-04-04 NOTE — Hospital Course (Addendum)
 89 y.o. female with past medical history of chronic HFpEF, diabetes type 2, peripheral artery disease, cirrhosis, and HTN who presented to the ED on 03/27/2024 with nausea/vomiting, lethargy, and weakness. She is typically alert but was progressively confused over the course of the day as well.  All history is obtained from chart 2/2 pt's acute encephalopathy.  She is admitted with sepsis secondary to UTI and colitis, AKI, lactic acidosis, metabolic acidosis, acute metabolic encephalopathy, and new onset A-fib. She was treated in the ICU for septic shock from E. coli and Proteus UTI.  Antibiotic courses were completed.  She had some initial improvement after transfer out of the ICU however had ongoing difficulty with nutrition and maintaining adequate oral intake.  She was cortrak dependent. She had progressive decline and developed further issues with respiratory distress requiring BiPAP.  Hyperammonemia requiring lactulose .  Ongoing failure to thrive and decreased responsiveness despite maximal medical efforts.  Palliative care was consulted and help assist with GOC discussions. Ongoing medical care continued but patient continued to further decline. Ultimately she became bradycardic and more apneic and passed naturally on 05/16/24.

## 2024-04-05 DIAGNOSIS — I4891 Unspecified atrial fibrillation: Secondary | ICD-10-CM | POA: Diagnosis not present

## 2024-04-05 DIAGNOSIS — R6521 Severe sepsis with septic shock: Secondary | ICD-10-CM | POA: Diagnosis not present

## 2024-04-05 DIAGNOSIS — G9341 Metabolic encephalopathy: Secondary | ICD-10-CM | POA: Diagnosis not present

## 2024-04-05 DIAGNOSIS — A419 Sepsis, unspecified organism: Secondary | ICD-10-CM | POA: Diagnosis not present

## 2024-04-05 LAB — COMPREHENSIVE METABOLIC PANEL WITH GFR
ALT: 18 U/L (ref 0–44)
AST: 16 U/L (ref 15–41)
Albumin: 1.9 g/dL — ABNORMAL LOW (ref 3.5–5.0)
Alkaline Phosphatase: 69 U/L (ref 38–126)
Anion gap: 14 (ref 5–15)
BUN: 8 mg/dL (ref 8–23)
CO2: 20 mmol/L — ABNORMAL LOW (ref 22–32)
Calcium: 8.8 mg/dL — ABNORMAL LOW (ref 8.9–10.3)
Chloride: 116 mmol/L — ABNORMAL HIGH (ref 98–111)
Creatinine, Ser: 0.38 mg/dL — ABNORMAL LOW (ref 0.44–1.00)
GFR, Estimated: >60 mL/min (ref >60)
Glucose, Bld: 187 mg/dL — ABNORMAL HIGH (ref 70–99)
Potassium: 3.7 mmol/L (ref 3.5–5.1)
Sodium: 150 mmol/L — ABNORMAL HIGH (ref 135–145)
Total Bilirubin: 0.5 mg/dL (ref 0.0–1.2)
Total Protein: 5.6 g/dL — ABNORMAL LOW (ref 6.5–8.1)

## 2024-04-05 LAB — GLUCOSE, CAPILLARY
Glucose-Capillary: 158 mg/dL — ABNORMAL HIGH (ref 70–99)
Glucose-Capillary: 161 mg/dL — ABNORMAL HIGH (ref 70–99)
Glucose-Capillary: 161 mg/dL — ABNORMAL HIGH (ref 70–99)
Glucose-Capillary: 182 mg/dL — ABNORMAL HIGH (ref 70–99)
Glucose-Capillary: 197 mg/dL — ABNORMAL HIGH (ref 70–99)
Glucose-Capillary: 231 mg/dL — ABNORMAL HIGH (ref 70–99)
Glucose-Capillary: 281 mg/dL — ABNORMAL HIGH (ref 70–99)

## 2024-04-05 LAB — MAGNESIUM: Magnesium: 1.8 mg/dL (ref 1.7–2.4)

## 2024-04-05 LAB — PHOSPHORUS: Phosphorus: 1.6 mg/dL — ABNORMAL LOW (ref 2.5–4.6)

## 2024-04-05 MED ORDER — FREE WATER
125.0000 mL | Freq: Three times a day (TID) | Status: DC
  Administered 2024-04-05 (×2): 125 mL

## 2024-04-05 MED ORDER — FREE WATER
125.0000 mL | Freq: Every day | Status: DC
  Administered 2024-04-05 – 2024-04-17 (×67): 125 mL

## 2024-04-05 MED ORDER — VITAL 1.5 CAL PO LIQD
840.0000 mL | Freq: Every day | ORAL | Status: DC
  Administered 2024-04-05 – 2024-04-11 (×7): 840 mL
  Filled 2024-04-05: qty 1000
  Filled 2024-04-05: qty 948
  Filled 2024-04-05 (×9): qty 1000

## 2024-04-05 MED ORDER — MAGNESIUM SULFATE IN D5W 1-5 GM/100ML-% IV SOLN
1.0000 g | Freq: Once | INTRAVENOUS | Status: AC
  Administered 2024-04-05: 1 g via INTRAVENOUS
  Filled 2024-04-05: qty 100

## 2024-04-05 MED ORDER — PANTOPRAZOLE SODIUM 40 MG PO TBEC
40.0000 mg | DELAYED_RELEASE_TABLET | Freq: Every day | ORAL | Status: DC
  Administered 2024-04-06 – 2024-04-13 (×8): 40 mg via ORAL
  Filled 2024-04-05 (×7): qty 1

## 2024-04-05 MED ORDER — ZINC OXIDE 40 % EX OINT
TOPICAL_OINTMENT | Freq: Two times a day (BID) | CUTANEOUS | Status: DC
  Filled 2024-04-05 (×3): qty 57

## 2024-04-05 NOTE — Consult Note (Addendum)
 WOC Nurse Consult Note: Reason for Consult: sacral ulcer Wound type: Moisture associated skin damage to upper gluteal cleft and peri-anal area Pressure Injury POA: NA Measurement: 3 cm x 1.5 cm Wound bed: light pink, moist Drainage (amount, consistency, odor) serous Periwound: intact Dressing procedure/placement/frequency:   Cleanse post voiding, apply Desitin to perianal area and buttocks twice daily and PRN soiling.  Consider use of stool containment sytem if experiencing multiple liquid stools through out the day.  LALM in use for pressure redistribution and moisture management.  WOC team will not follow at this time.  Please re-consult if new needs arise.  Thank you,  Doyal Polite, RN, MSN, [facility] WOC Team [phone] (Available Mon-Fri 0700-1500)

## 2024-04-05 NOTE — Progress Notes (Signed)
 Progress Note   Patient: Toni [name] FMW:[id number] DOB: [birth year] DOA: 03/27/2024     8 DOS: the patient was seen and examined on 04/05/2024   Brief hospital course: 89 y.o. female  with past medical history of chronic HFpEF, diabetes type 2, peripheral artery disease, cirrhosis, and HTN who presented to the ED on 03/27/2024 with nausea/vomiting, lethargy, and weakness. She is typically alert but was progressively confused over the course of the day as well. All history is obtained from chart 2/2 pt's acute encephalopathy. Pt has not had a bowel movement for 5 days and has been suffering from abdominal distention and pain at presentation. She is admitted with sepsis secondary to UTI and colitis, AKI, lactic acidosis, metabolic acidosis, acute metabolic encephalopathy, and new onset A-fib. Pt reportedly has frequent uti's in past. UA pending. Lactate elevated on presentation but improving to 3 with volume. Ultimately, she required initiation of norepi to maintain BP. Found to be in aki with Cr 1.5 (baseline 0.7) with some hydronephrosis 2/2 R staghorn stone. There is also stigmata of espophagitis and colitis.   Assessment and Plan: Sepsis 2/2 UTI- likely pyelonephritis and colitis; shock resolved E. Coli & Proteus mirabilis UTI -E. coli resistant to ampicillin and Proteus resistant to Macrodantin, has been de-escalated to ceftriaxone  based on susceptibilities -Continue midodrine  5 mg 3 times daily, wean off as tolerated - Was found to have staghorn calculus in the right kidney without any obstructive processes although did have mild right hydronephrosis and dilatation of the proximal right ureter on ultrasound and CT imaging   Chronic HFpEF  -ECHO  EF 70-75%, hyperdynamic, severe MR calcifications with associated murmur -Currently no acute issues, follow closely for any volume overload,   Acute hypokalemia -Recurrent problem, continue daily placement    Atrial fibrillation, new-onset -Now in  NSR, likely due to septic shock  - CHA2DS2-VASc 2: 6-if A-fib returns, will need GOC regarding anticoagulation   Cirrhosis/Hyperammonemia -Noted on CT abd/pelvis -Continue lactulose , alert and conversant   Normocytic anemia H&H stable   Constipation -Found to have large stool ball in the rectum with mild rectal wall thickening, stercoral colitis -Now having BM, continue lactulose  as above     Esophagitis, chronic  CT revealed marked wall thickening of the distal esophagus, esophagitis.  Infiltrative mass has not been excluded - No GI bleed.  Palliative medicine following for GOC - Per palliative medicine, family wishes to continue supportive care while hoping for improvement. Given advanced age and multiple medical problems not a candidate for aggressive treatment otherwise -Continue PPI   Acute kidney injury with mild right hydronephrosis, improving  -Seen by urology, Dr. Renda on 9/10, recommended outpatient follow-up once patient is optimized for consideration of PCNL.   - Cr now normalized     Diabetes mellitus type 2 CBG (last 3)  Continue moderate SSI every 4 hours,  cont insulin  Lantus  5 units daily -Glycemic trends stable   Moderate protein energy malnutrition -Continue tube feeds, SLP 9/15-> full liquid diet -Speech therapy following, advanced to dys 3 diet -Dietitian following. Pt reportedly doing better with PO intake per niece, plan to transition to nocturnal feed for now   Obesity class II Estimated body mass index is 38.39 kg/m as calculated from the following:   Height as of this encounter: 5' 2 (1.575 m).   Weight as of this encounter: 95.2 kg.         Subjective: Much more alert, interactive and conversant. Without complaints  Physical Exam: Vitals:  04/05/24 0710 04/05/24 1057 04/05/24 1438 04/05/24 1605  BP: 109/64 129/79 116/63 129/70  Pulse: 87 (!) 102  90  Resp: (!) 23 (!) 31 (!) 34 20  Temp: 98.7 F (37.1 C) 97.6 F (36.4 C)  (!) 97.5  F (36.4 C)  TempSrc: Axillary Oral  Axillary  SpO2: 98% 98%  100%  Weight:      Height:       General exam: Conversant, in no acute distress Respiratory system: normal chest rise, clear, no audible wheezing Cardiovascular system: regular rhythm, s1-s2 Gastrointestinal system: Nondistended, nontender, pos BS Central nervous system: No seizures, no tremors Extremities: No cyanosis, no joint deformities Skin: No rashes, no pallor Psychiatry: Affect normal // no auditory hallucinations   Data Reviewed:  Labs reviewed: Phos 1.3  Family Communication: Pt in room, family at bedside  Disposition: Status is: Inpatient Remains inpatient appropriate because: severity of illness  Planned Discharge Destination: Skilled nursing facility    Author: Garnette Pelt, MD 04/05/2024 5:21 PM  For on call review [email or web address].

## 2024-04-05 NOTE — Progress Notes (Addendum)
 Notified by CCMD that pt had 7 beat run of Vtach. Pt asymptomatic, VS stable. Opyd, MD notified. New orders placed.

## 2024-04-05 NOTE — Progress Notes (Signed)
 Nutrition Follow-up  DOCUMENTATION CODES:   Non-severe (moderate) malnutrition in context of acute illness/injury, Obesity unspecified  INTERVENTION:  -Dysphagia 3 diet  -Change to Vital 1.5 continuous NOC feed @ goal rate 70 ml/hr via Cortrak tube x 12 hrs/day from 1800-0600              At goal this provides 1260 kcal, 57 g protein, 642 ml fluid  -Sodium 150 today, MD order for 125 ml flushes q-8, recommend 125 ml q-2 during NOC feed. Formula + flushes would equal 1392 ml  -Continue to monitor electrolytes and replete per MD   NUTRITION DIAGNOSIS:   Moderate Malnutrition related to acute illness as evidenced by mild muscle depletion, energy intake < or equal to 50% for > or equal to 5 days.  Ongoing  GOAL:   Patient will meet greater than or equal to 90% of their needs  Met with EN  MONITOR:   Diet advancement, TF tolerance, Weight trends, Labs  REASON FOR ASSESSMENT:   Consult Enteral/tube feeding initiation and management, Assessment of nutrition requirement/status  ASSESSMENT:   89 yo female admitted with sepsis with UTI and colitis, +shock. Pt with increasing weakness and lethargy with development of N/V with constipation for about 1 week PTA; +AKI. PMH includes HFpEF, DM, HTN, obesity, cirrhosis  Spoke to pt and nursing in room. Pt with no s/sx of EN intolerance. Diet recently upgraded to Dysphagia 3 last night. No PO intake for breakfast (tray untouched in room). Discussed plan to reduce tube feeding to NOC only to try to promote daytime PO intake. Encouraged nursing to assist with daytime intake. Will order Ensure Plus High Protein BID. Pt plan to d/c to SNF after medically stable. Would not recommend pt to be candidate for PEG tube/long term enteral nutrition; plan to wean off EN and meet needs as possible with PO intake. Discussed EN/FWF with MD, agreeable with plan. Will continue to monitor, RDN available prn.    Labs Na 150 BG 187 Cr 0.38 Calcium  8.8 Phos 1.6 Mg 1.8 Albumin  1.9 H/H 10.5/35.6  Medications  Chlorhexidine  Gluconate Cloth  6 each Topical Daily   feeding supplement (VITAL 1.5 CAL)  840 mL Per Tube Daily   free water   125 mL Per Tube 6 X Daily   heparin   5,000 Units Subcutaneous Q8H   insulin  aspart  0-15 Units Subcutaneous Q4H   insulin  glargine  5 Units Subcutaneous Daily   lactulose   30 g Per Tube TID   liver oil-zinc  oxide   Topical BID   metoCLOPramide  (REGLAN ) injection  10 mg Intravenous Q8H   midodrine   5 mg Per Tube Q8H   mouth rinse  15 mL Mouth Rinse 4 times per day   [START ON 04/06/2024] pantoprazole   40 mg Oral Daily   phosphorus  500 mg Per Tube BID     NUTRITION - FOCUSED PHYSICAL EXAM:  Flowsheet Row Most Recent Value  Orbital Region No depletion  Upper Arm Region No depletion  Thoracic and Lumbar Region Unable to assess  Buccal Region Mild depletion  Temple Region Mild depletion  Clavicle Bone Region Mild depletion  Clavicle and Acromion Bone Region Mild depletion  Scapular Bone Region Mild depletion  Dorsal Hand Moderate depletion  Patellar Region Moderate depletion  Anterior Thigh Region Moderate depletion  Posterior Calf Region Moderate depletion  Edema (RD Assessment) Mild  Hair Reviewed  Eyes Reviewed  Mouth Unable to assess  [Pt indicated she could not open her mouth at  this time]  Skin Other (Comment)  [dry flaky/scaly skin]  Nails Reviewed    Diet Order:   Diet Order             DIET DYS 3 Room service appropriate? No; Fluid consistency: Thin  Diet effective now                   EDUCATION NEEDS:   Education needs have been addressed  Skin:  Skin Assessment: Skin Integrity Issues: Skin Integrity Issues:: Stage I Stage I: Anus  Last BM:  9/18  Height:   Ht Readings from Last 1 Encounters:  03/28/24 5' 2 (1.575 m)    Weight:   Wt Readings from Last 1 Encounters:  04/05/24 90 kg    BMI:  Body mass index is 36.29 kg/m.  Estimated Nutritional  Needs:   Kcal:  1550-1750 kcals  Protein:  90-110 g  Fluid:  >/=1.7 L  Faustine Tates Daml-Budig, RDN, LDN Registered Dietitian Nutritionist RD Inpatient Contact Info in [place]

## 2024-04-05 NOTE — Progress Notes (Signed)
 Rt placed PT on CPAP at this time.   04/05/24 2232  BiPAP/CPAP/SIPAP  BiPAP/CPAP/SIPAP Pt Type Adult  BiPAP/CPAP/SIPAP Resmed  Reason BIPAP/CPAP not in use Other(comment)  Mask Type Full face mask  Dentures removed? Not applicable  Mask Size Medium  Set Rate 0 breaths/min  Respiratory Rate 18 breaths/min  IPAP 12 cmH20  EPAP 5 cmH2O  Pressure Support 12 cmH20  PEEP 5 cmH20  FiO2 (%) 21 %  Flow Rate 0 lpm  Minute Ventilation 12  Leak 19  Peak Inspiratory Pressure (PIP) 18  Tidal Volume (Vt) 501  Patient Home Machine No  Patient Home Mask No  Patient Home Tubing No  Auto Titrate No  Press High Alarm 25 cmH2O  Press Low Alarm 2 cmH2O  Nasal massage performed Yes  CPAP/SIPAP surface wiped down Yes  Device Plugged into RED Power Outlet Yes  Oxygen Percent 21 %

## 2024-04-06 ENCOUNTER — Inpatient Hospital Stay (HOSPITAL_COMMUNITY)

## 2024-04-06 DIAGNOSIS — N390 Urinary tract infection, site not specified: Secondary | ICD-10-CM | POA: Diagnosis not present

## 2024-04-06 DIAGNOSIS — A419 Sepsis, unspecified organism: Secondary | ICD-10-CM | POA: Diagnosis not present

## 2024-04-06 DIAGNOSIS — G9341 Metabolic encephalopathy: Secondary | ICD-10-CM | POA: Diagnosis not present

## 2024-04-06 DIAGNOSIS — R6521 Severe sepsis with septic shock: Secondary | ICD-10-CM | POA: Diagnosis not present

## 2024-04-06 LAB — COMPREHENSIVE METABOLIC PANEL WITH GFR
ALT: 15 U/L (ref 0–44)
AST: 14 U/L — ABNORMAL LOW (ref 15–41)
Albumin: 1.7 g/dL — ABNORMAL LOW (ref 3.5–5.0)
Alkaline Phosphatase: 60 U/L (ref 38–126)
Anion gap: 7 (ref 5–15)
BUN: 12 mg/dL (ref 8–23)
CO2: 20 mmol/L — ABNORMAL LOW (ref 22–32)
Calcium: 8.2 mg/dL — ABNORMAL LOW (ref 8.9–10.3)
Chloride: 116 mmol/L — ABNORMAL HIGH (ref 98–111)
Creatinine, Ser: 0.4 mg/dL — ABNORMAL LOW (ref 0.44–1.00)
GFR, Estimated: >60 mL/min (ref >60)
Glucose, Bld: 257 mg/dL — ABNORMAL HIGH (ref 70–99)
Potassium: 3.4 mmol/L — ABNORMAL LOW (ref 3.5–5.1)
Sodium: 143 mmol/L (ref 135–145)
Total Bilirubin: 0.2 mg/dL (ref 0.0–1.2)
Total Protein: 5.3 g/dL — ABNORMAL LOW (ref 6.5–8.1)

## 2024-04-06 LAB — CBC
HCT: 33 % — ABNORMAL LOW (ref 36.0–46.0)
Hemoglobin: 9.8 g/dL — ABNORMAL LOW (ref 12.0–15.0)
MCH: 27.1 pg (ref 26.0–34.0)
MCHC: 29.7 g/dL — ABNORMAL LOW (ref 30.0–36.0)
MCV: 91.2 fL (ref 80.0–100.0)
Platelets: 223 K/uL (ref 150–400)
RBC: 3.62 MIL/uL — ABNORMAL LOW (ref 3.87–5.11)
RDW: 14.9 % (ref 11.5–15.5)
WBC: 7.2 K/uL (ref 4.0–10.5)
nRBC: 1.7 % — ABNORMAL HIGH (ref 0.0–0.2)

## 2024-04-06 LAB — MAGNESIUM: Magnesium: 1.7 mg/dL (ref 1.7–2.4)

## 2024-04-06 LAB — GLUCOSE, CAPILLARY
Glucose-Capillary: 243 mg/dL — ABNORMAL HIGH (ref 70–99)
Glucose-Capillary: 249 mg/dL — ABNORMAL HIGH (ref 70–99)
Glucose-Capillary: 220 mg/dL — ABNORMAL HIGH (ref 70–99)
Glucose-Capillary: 152 mg/dL — ABNORMAL HIGH (ref 70–99)
Glucose-Capillary: 212 mg/dL — ABNORMAL HIGH (ref 70–99)

## 2024-04-06 LAB — PHOSPHORUS: Phosphorus: 2.4 mg/dL — ABNORMAL LOW (ref 2.5–4.6)

## 2024-04-06 MED ORDER — GERHARDT'S BUTT CREAM
TOPICAL_CREAM | Freq: Every day | CUTANEOUS | Status: DC
  Administered 2024-04-08 – 2024-04-12 (×2): 1 via TOPICAL
  Filled 2024-04-06: qty 60

## 2024-04-06 MED ORDER — LACTULOSE 10 GM/15ML PO SOLN
30.0000 g | Freq: Two times a day (BID) | ORAL | Status: DC
  Administered 2024-04-07 (×2): 30 g
  Filled 2024-04-06 (×2): qty 45

## 2024-04-06 NOTE — Progress Notes (Signed)
 Occupational Therapy Treatment Patient Details Name: Toni [name] MRN: [id number] DOB: 01/06/1933 Today's Date: 04/06/2024   History of present illness 89 y/o woman who presented 9/9 with confusion, poor appetite and PO intake, found to have urosepsis. Acute metabolic encephalopathy. PMH: bedbound after multiple leg surgeries, HTN, DM2   OT comments  Pt initiated self feeding with OT loading the spoon, using L UE and hand over hand in bed chair position. Pt requires extensive time to chew and complete breakfast tray. PT benefits from softer food options such as scrambled egg vs the chopped sausage on plate from energy conservation stand point. Recommendation for skilled inpatient follow up therapy, <3 hours/day remains appropriate.       If plan is discharge home, recommend the following:  Two people to help with walking and/or transfers;Two people to help with bathing/dressing/bathroom   Equipment Recommendations  Wheelchair cushion (measurements OT);Wheelchair (measurements OT);Hospital bed;Hoyer lift (air mattress overlay)    Recommendations for Other Services      Precautions / Restrictions Precautions Precautions: Fall Recall of Precautions/Restrictions: Impaired Precaution/Restrictions Comments: incontinence of stool, watch RR in supine, cortrak       Mobility Bed Mobility               General bed mobility comments: used bed to sit upright for self feeding    Transfers                         Balance                                           ADL either performed or assessed with clinical judgement   ADL Overall ADL's : Needs assistance/impaired Eating/Feeding: Maximal assistance;Bed level Eating/Feeding Details (indicate cue type and reason): hob increased to 50 degrees to simulate chair position so pt could sit above plate and see food     Upper Body Bathing: Total assistance   Lower Body Bathing: Total assistance   Upper  Body Dressing : Total assistance   Lower Body Dressing: Total assistance                 General ADL Comments: total (A) for adls    Extremity/Trunk Assessment Upper Extremity Assessment Upper Extremity Assessment: Generalized weakness;Right hand dominant;RUE deficits/detail;LUE deficits/detail RUE Deficits / Details: pt holding spoon with 3- out 5 grasp. pt with decreased initiation and requires pillow under elbor for shoulder flexion and hand over hand to initiate elbow flexion. LUE Deficits / Details: pt observed using L hand for cups and attempting to (A) R hand with spoon. OT switched to LUE for self feeding with increased hand ot mouth but no awareness to place food on the spoon   Lower Extremity Assessment Lower Extremity Assessment: Generalized weakness        Vision   Additional Comments: pt using central vision and not visually tracking therapist in the room   Perception     Praxis     Communication Communication Communication: Impaired Factors Affecting Communication:  (no verbalizations all session)   Cognition Arousal: Alert Behavior During Therapy: Flat affect Cognition: Cognition impaired             OT - Cognition Comments: pt perseverating on bringing L UE hand ot mouth without food and chewing without food present. pt needs (A) to initiate and sequence task  Following commands: Impaired Following commands impaired: Follows one step commands inconsistently, Follows one step commands with increased time      Cueing   Cueing Techniques: Gestural cues, Verbal cues, Tactile cues, Visual cues  Exercises      Shoulder Instructions       General Comments HR 97 RA    Pertinent Vitals/ Pain       Pain Assessment Pain Assessment: No/denies pain  Home Living                                          Prior Functioning/Environment              Frequency  Min 1X/week        Progress Toward  Goals  OT Goals(current goals can now be found in the care plan section)  Progress towards OT goals: Progressing toward goals  Acute Rehab OT Goals Patient Stated Goal: family wants pt to self feed as their main goal OT Goal Formulation: Patient unable to participate in goal setting Time For Goal Achievement: 04/20/24 Potential to Achieve Goals: Fair ADL Goals Pt Will Perform Eating: with min assist;bed level Pt Will Perform Grooming: with min assist;bed level Additional ADL Goal #1:  (discontinued as not appropriate after talking to daughter) Additional ADL Goal #2: pt will follow 2 step command 50% of session  Plan      Co-evaluation                 AM-PAC OT 6 Clicks Daily Activity     Outcome Measure   Help from another person eating meals?: A Lot Help from another person taking care of personal grooming?: Total Help from another person toileting, which includes using toliet, bedpan, or urinal?: Total Help from another person bathing (including washing, rinsing, drying)?: Total Help from another person to put on and taking off regular upper body clothing?: Total Help from another person to put on and taking off regular lower body clothing?: Total 6 Click Score: 7    End of Session    OT Visit Diagnosis: Unsteadiness on feet (R26.81);Muscle weakness (generalized) (M62.81)   Activity Tolerance Patient tolerated treatment well   Patient Left in bed;with call bell/phone within reach;with bed alarm set   Nurse Communication Need for lift equipment;Precautions;Mobility status        Time: 0810-     Charges: OT General Charges $OT Visit: 1 Visit OT Treatments $Self Care/Home Management : 8-22 mins   Brynn, OTR/L  Acute Rehabilitation Services Office: [phone] .   Ely Molt 04/06/2024, 8:36 AM

## 2024-04-06 NOTE — Progress Notes (Signed)
 Physical Therapy Treatment Patient Details Name: Toni [name] MRN: [id number] DOB: 1932-11-07 Today's Date: 04/06/2024   History of Present Illness 89 y/o woman adm 03/27/24 with confusion, poor appetite and PO intake, found to have urosepsis. Acute metabolic encephalopathy. PMH: bedbound after multiple leg surgeries, HTN, DM2    PT Comments  Pt alert, oriented to self and able to follow simple commands to wiggle toes and reach for rail. PT with hand over hand assist to reach for rail with max assist to roll left and total assist to roll right. Pt with no noted active movement of left knee and pain in LLE with movement. Performed AAROM RLE and encouraged pt moving as able.     If plan is discharge home, recommend the following: Two people to help with walking and/or transfers;Two people to help with bathing/dressing/bathroom;Assistance with cooking/housework;Direct supervision/assist for medications management;Direct supervision/assist for financial management;Assist for transportation;Supervision due to cognitive status   Can travel by private vehicle     No  Equipment Recommendations  None recommended by PT    Recommendations for Other Services       Precautions / Restrictions Precautions Precautions: Fall Recall of Precautions/Restrictions: Impaired Precaution/Restrictions Comments: incontinence of stool, watch RR in supine, cortrak, contracted left knee in extension     Mobility  Bed Mobility Overal bed mobility: Needs Assistance Bed Mobility: Rolling           General bed mobility comments: max assist to roll left, total assist to roll right x 2 trials bil. Total +2 to slide toward [facility]    Transfers                   General transfer comment: does not transfer at baseline    Ambulation/Gait                   Stairs             Wheelchair Mobility     Tilt Bed    Modified Rankin (Stroke Patients Only)       Balance                                             Communication Communication Communication: Impaired Factors Affecting Communication:  (no verbalizations all session)  Cognition Arousal: Alert Behavior During Therapy: Flat affect   PT - Cognitive impairments: No family/caregiver present to determine baseline, Orientation                       PT - Cognition Comments: pt oriented to self and place not time or situation Following commands: Impaired Following commands impaired: Follows one step commands inconsistently, Follows one step commands with increased time    Cueing Cueing Techniques: Gestural cues, Verbal cues, Tactile cues, Visual cues  Exercises General Exercises - Lower Extremity Heel Slides: AAROM, Right, Supine, 10 reps (no knee flexion on left even with assist) Hip ABduction/ADduction: AAROM, Right, Supine, 10 reps (PROM LLE, 5 reps supine)    General Comments General comments (skin integrity, edema, etc.): HR 97 RA      Pertinent Vitals/Pain Pain Assessment Pain Assessment: 0-10 Pain Score: 4  Pain Location: pain with LLE movement Pain Descriptors / Indicators: Aching, Grimacing Pain Intervention(s): Limited activity within patient's tolerance, Repositioned    Home Living  Prior Function            PT Goals (current goals can now be found in the care plan section) Progress towards PT goals: Not progressing toward goals - comment    Frequency    Min 1X/week      PT Plan      Co-evaluation              AM-PAC PT 6 Clicks Mobility   Outcome Measure  Help needed turning from your back to your side while in a flat bed without using bedrails?: Total Help needed moving from lying on your back to sitting on the side of a flat bed without using bedrails?: Total Help needed moving to and from a bed to a chair (including a wheelchair)?: Total Help needed standing up from a chair using your arms (e.g.,  wheelchair or bedside chair)?: Total Help needed to walk in hospital room?: Total Help needed climbing 3-5 steps with a railing? : Total 6 Click Score: 6    End of Session   Activity Tolerance: Patient tolerated treatment well Patient left: in bed;with call bell/phone within reach;with bed alarm set Nurse Communication: Mobility status;Need for lift equipment PT Visit Diagnosis: Other abnormalities of gait and mobility (R26.89);Muscle weakness (generalized) (M62.81);Other symptoms and signs involving the nervous system (R29.898)     Time: 9240-9187 PT Time Calculation (min) (ACUTE ONLY): 13 min  Charges:    $Therapeutic Activity: 8-22 mins PT General Charges $$ ACUTE PT VISIT: 1 Visit                     Lenoard SQUIBB, PT Acute Rehabilitation Services Office: [phone]    Khaylee Mcevoy B Canden Cieslinski 04/06/2024, 9:02 AM

## 2024-04-06 NOTE — TOC Progression Note (Addendum)
 Transition of Care [facility]) - Progression Note    Patient Details  Name: Toni [name] MRN: [id number] Date of Birth: [birth year]  [facility] CM/SW Contact  Luise [name], [place] Phone Number: 04/06/2024, 11:08 AM  Clinical Narrative:   Patient currently has a cortrak. CSW will fax referrals for SNF once there are plans for cortrak removal.   12:14 PM CSW spoke with patients daughter, [name] about PT recs for SNF. [name] is agreeable with SNF at time of discharge. [name] provided her email for CSW to send bed offers to - [email or web address].[name] stated patient can come home after SNF. [name] stated she was informed patient could have in home services covered by Medicare. CSW explained that [facility] services are covered under medicare. CSW inquired about if patient has LTC medicaid in place, patient does not at this time. CSW asked [name]'s permission to contact financial counseling and if she can be a contact for patient, [name] stated yes. CSW to reach out to [facility] at this time.   CSW will continue to follow.    Expected Discharge Plan: Skilled Nursing Facility Barriers to Discharge: Continued Medical Work up               Expected Discharge Plan and Services In-house Referral: Clinical Social Work     Living arrangements for the past 2 months: Single Family Home                                       Social Drivers of Health (SDOH) Interventions SDOH Screenings   Food Insecurity: Patient Unable To Answer (03/30/2024)  Housing: Patient Unable To Answer (03/30/2024)  Transportation Needs: Patient Unable To Answer (03/30/2024)  Utilities: Patient Unable To Answer (03/30/2024)  Social Connections: Patient Unable To Answer (03/30/2024)  Tobacco Use: Low Risk  (03/27/2024)    Readmission Risk Interventions     No data to display

## 2024-04-06 NOTE — Progress Notes (Signed)
 Progress Note   Patient: Toni [name] FMW:[id number] DOB: [birth year] DOA: 03/27/2024     9 DOS: the patient was seen and examined on 04/06/2024   Brief hospital course: 89 y.o. female  with past medical history of chronic HFpEF, diabetes type 2, peripheral artery disease, cirrhosis, and HTN who presented to the ED on 03/27/2024 with nausea/vomiting, lethargy, and weakness. She is typically alert but was progressively confused over the course of the day as well. All history is obtained from chart 2/2 pt's acute encephalopathy. Pt has not had a bowel movement for 5 days and has been suffering from abdominal distention and pain at presentation. She is admitted with sepsis secondary to UTI and colitis, AKI, lactic acidosis, metabolic acidosis, acute metabolic encephalopathy, and new onset A-fib. Pt reportedly has frequent uti's in past. UA pending. Lactate elevated on presentation but improving to 3 with volume. Ultimately, she required initiation of norepi to maintain BP. Found to be in aki with Cr 1.5 (baseline 0.7) with some hydronephrosis 2/2 R staghorn stone. There is also stigmata of espophagitis and colitis.   Assessment and Plan: Sepsis 2/2 UTI- likely pyelonephritis and colitis; shock resolved E. Coli & Proteus mirabilis UTI -E. coli resistant to ampicillin and Proteus resistant to Macrodantin, has been de-escalated to ceftriaxone  based on susceptibilities -Continue midodrine  5 mg 3 times daily, wean off as tolerated - Was found to have staghorn calculus in the right kidney without any obstructive processes although did have mild right hydronephrosis and dilatation of the proximal right ureter on ultrasound and CT imaging   Chronic HFpEF  -ECHO  EF 70-75%, hyperdynamic, severe MR calcifications with associated murmur -Currently no acute issues, follow closely for any volume overload,   Acute hypokalemia -Recurrent problem, continue daily placement    Atrial fibrillation, new-onset -Now in  NSR, likely due to septic shock  - CHA2DS2-VASc 2: 6-if A-fib returns, will need GOC regarding anticoagulation   Cirrhosis/Hyperammonemia -Noted on CT abd/pelvis -Continue lactulose , alert and conversant   Normocytic anemia H&H stable   Constipation -Found to have large stool ball in the rectum with mild rectal wall thickening, stercoral colitis -Now having BM, continue lactulose  as above. Will decrease to BID dosing     Esophagitis, chronic  CT revealed marked wall thickening of the distal esophagus, esophagitis.  Infiltrative mass has not been excluded - No GI bleed.  Palliative medicine following for GOC - Per palliative medicine, family wishes to continue supportive care while hoping for improvement. Given advanced age and multiple medical problems not a candidate for aggressive treatment otherwise -Continue PPI   Acute kidney injury with mild right hydronephrosis, improving  -Seen by urology, Dr. Renda on 9/10, recommended outpatient follow-up once patient is optimized for consideration of PCNL.   - Cr now normalized     Diabetes mellitus type 2 CBG (last 3)  Continue moderate SSI every 4 hours,  cont insulin  Lantus  5 units daily -Glycemic trends stable   Moderate protein energy malnutrition -Continue tube feeds, SLP 9/15-> full liquid diet -Speech therapy following, advanced to dys 3 diet -Dietitian following. Pt reportedly doing better with PO intake per niece, plan to transition to nocturnal feed for now   Obesity class II Estimated body mass index is 38.39 kg/m as calculated from the following:   Height as of this encounter: 5' 2 (1.575 m).   Weight as of this encounter: 95.2 kg.         Subjective: Without complaints this AM.  Pleasant  Physical Exam: Vitals:   04/06/24 0711 04/06/24 1042 04/06/24 1043 04/06/24 1107  BP: (!) 118/59 106/60    Pulse: 98 (!) 101 99 (!) 50  Resp: 19 (!) 30 (!) 23 (!) 24  Temp: (!) 97.5 F (36.4 C)  (!) 97.5 F (36.4 C)    TempSrc: Oral     SpO2: 97% 99% 100% 96%  Weight:      Height:       General exam: Awake, laying in bed, in nad Respiratory system: Normal respiratory effort, no wheezing Cardiovascular system: regular rate, s1, s2 Gastrointestinal system: Soft, nondistended, positive BS Central nervous system: CN2-12 grossly intact, strength intact Extremities: Perfused, no clubbing Skin: Normal skin turgor, no notable skin lesions seen Psychiatry: Mood normal // affect seems normal  Data Reviewed:  Labs reviewed: Na 143, K 3.4, Cr 0.40, WBC 7.2, Hgb 9.8, Plts 223  Family Communication: Pt in room, family at bedside  Disposition: Status is: Inpatient Remains inpatient appropriate because: severity of illness  Planned Discharge Destination: Skilled nursing facility    Author: Garnette Pelt, MD 04/06/2024 4:33 PM  For on call review [email or web address].

## 2024-04-06 NOTE — Inpatient Diabetes Management (Incomplete)
 Inpatient Diabetes Program Recommendations  AACE/ADA: New Consensus Statement on Inpatient Glycemic Control (2015)  Target Ranges:  Prepandial:   less than 140 mg/dL      Peak postprandial:   less than 180 mg/dL (1-2 hours)      Critically ill patients:  140 - 180 mg/dL   Lab Results  Component Value Date   GLUCAP 243 (H) 04/06/2024   HGBA1C 7.2 (H) 03/28/2024    Review of Glycemic Control  Latest Reference Range & Units 04/05/24 10:57 04/05/24 16:06 04/05/24 20:19 04/06/24 00:08 04/06/24 04:07  Glucose-Capillary 70 - 99 mg/dL 817 (H) 802 (H) 718 (H) 249 (H) 243 (H)  (H): Data is abnormally high Diabetes history: Type 2 DM Outpatient Diabetes medications: Metformin 500 mg BID Current orders for Inpatient glycemic control:  Vital 70 ml/hr 1800-0600 Lantus  5 units daily Novolog  0-15 units q 4 hours Inpatient Diabetes Program Recommendations:    Consider increasing Lantus  to 5 units bid.    Thanks,  Randall Bullocks, RN, BC-ADM Inpatient Diabetes Coordinator Pager [phone]  (8a-5p)

## 2024-04-06 NOTE — Plan of Care (Signed)
 I have been checking in on Toni [name] intermittently. I was able to speak too her granddaughter, Shanda Griffiths.  We reviewed plans for clinic follow-up to address her kidney stone, as well as the need to keep her constipation in check.  When she returns home they will schedule with [facility] where we will discuss options for stone management.  Renal function is preserved.  Continue bowel maintenance.  Urology will sign off at this time.  Please call with questions.  [facility] [address] [place], [place]  [address] [phone]

## 2024-04-07 ENCOUNTER — Other Ambulatory Visit: Payer: Self-pay

## 2024-04-07 DIAGNOSIS — G9341 Metabolic encephalopathy: Secondary | ICD-10-CM | POA: Diagnosis not present

## 2024-04-07 DIAGNOSIS — A419 Sepsis, unspecified organism: Secondary | ICD-10-CM | POA: Diagnosis not present

## 2024-04-07 DIAGNOSIS — R6521 Severe sepsis with septic shock: Secondary | ICD-10-CM | POA: Diagnosis not present

## 2024-04-07 DIAGNOSIS — I4891 Unspecified atrial fibrillation: Secondary | ICD-10-CM | POA: Diagnosis not present

## 2024-04-07 LAB — GLUCOSE, CAPILLARY
Glucose-Capillary: 111 mg/dL — ABNORMAL HIGH (ref 70–99)
Glucose-Capillary: 137 mg/dL — ABNORMAL HIGH (ref 70–99)
Glucose-Capillary: 139 mg/dL — ABNORMAL HIGH (ref 70–99)
Glucose-Capillary: 144 mg/dL — ABNORMAL HIGH (ref 70–99)
Glucose-Capillary: 170 mg/dL — ABNORMAL HIGH (ref 70–99)
Glucose-Capillary: 189 mg/dL — ABNORMAL HIGH (ref 70–99)
Glucose-Capillary: 197 mg/dL — ABNORMAL HIGH (ref 70–99)
Glucose-Capillary: 207 mg/dL — ABNORMAL HIGH (ref 70–99)

## 2024-04-07 MED ORDER — INFLUENZA VAC SPLIT HIGH-DOSE 0.5 ML IM SUSY: 0.5000 mL | PREFILLED_SYRINGE | INTRAMUSCULAR | Status: DC | PRN

## 2024-04-07 MED ORDER — INFLUENZA VAC SPLIT HIGH-DOSE 0.5 ML IM SUSY: 0.5000 mL | PREFILLED_SYRINGE | INTRAMUSCULAR | Status: DC

## 2024-04-07 MED ORDER — ORAL CARE MOUTH RINSE: 15.0000 mL | OROMUCOSAL | Status: DC | PRN

## 2024-04-07 MED ORDER — ENSURE PLUS HIGH PROTEIN PO LIQD
237.0000 mL | Freq: Two times a day (BID) | ORAL | Status: DC
  Administered 2024-04-08 – 2024-04-11 (×6): 237 mL via ORAL

## 2024-04-07 MED ORDER — ORAL CARE MOUTH RINSE
15.0000 mL | OROMUCOSAL | Status: DC
Start: 2024-04-07 — End: 2024-04-17
  Administered 2024-04-07 – 2024-04-16 (×37): 15 mL via OROMUCOSAL

## 2024-04-07 NOTE — Progress Notes (Signed)
   04/07/24 2255  BiPAP/CPAP/SIPAP  Reason BIPAP/CPAP not in use Non-compliant   Patient refused

## 2024-04-07 NOTE — Progress Notes (Signed)
 Completed what admission able from notes  called family to finish others LM

## 2024-04-07 NOTE — Progress Notes (Signed)
 Progress Note   Patient: Toni [name] FMW:[id number] DOB: Nov 13, 1932 DOA: 03/27/2024     10 DOS: the patient was seen and examined on 04/07/2024   Brief hospital course: 89 y.o. female  with past medical history of chronic HFpEF, diabetes type 2, peripheral artery disease, cirrhosis, and HTN who presented to the ED on 03/27/2024 with nausea/vomiting, lethargy, and weakness. She is typically alert but was progressively confused over the course of the day as well. All history is obtained from chart 2/2 pt's acute encephalopathy. Pt has not had a bowel movement for 5 days and has been suffering from abdominal distention and pain at presentation. She is admitted with sepsis secondary to UTI and colitis, AKI, lactic acidosis, metabolic acidosis, acute metabolic encephalopathy, and new onset A-fib. Pt reportedly has frequent uti's in past. UA pending. Lactate elevated on presentation but improving to 3 with volume. Ultimately, she required initiation of norepi to maintain BP. Found to be in aki with Cr 1.5 (baseline 0.7) with some hydronephrosis 2/2 R staghorn stone. There is also stigmata of espophagitis and colitis.   Assessment and Plan: Sepsis 2/2 UTI- likely pyelonephritis and colitis; shock resolved E. Coli & Proteus mirabilis UTI -E. coli resistant to ampicillin  and Proteus resistant to Macrodantin, has been de-escalated to ceftriaxone  based on susceptibilities -Continue midodrine  5 mg 3 times daily, wean off as tolerated - Was found to have staghorn calculus in the right kidney without any obstructive processes although did have mild right hydronephrosis and dilatation of the proximal right ureter on ultrasound and CT imaging   Chronic HFpEF  -ECHO  EF 70-75%, hyperdynamic, severe MR calcifications with associated murmur -Currently no acute issues, follow closely for any volume overload,   Acute hypokalemia -Recurrent problem, continue daily placement    Atrial fibrillation, new-onset -Now in  NSR, likely due to septic shock  - CHA2DS2-VASc 2: 6-if A-fib returns, will need GOC regarding anticoagulation   Cirrhosis/Hyperammonemia -Noted on CT abd/pelvis -Continue lactulose , alert and conversant   Normocytic anemia H&H stable   Constipation -Found to have large stool ball in the rectum with mild rectal wall thickening, stercoral colitis -Now having BM, continue lactulose  as above. Will decrease to BID dosing     Esophagitis, chronic  CT revealed marked wall thickening of the distal esophagus, esophagitis.  Infiltrative mass has not been excluded - No GI bleed.  Palliative medicine following for GOC - Per palliative medicine, family wishes to continue supportive care while hoping for improvement. Given advanced age and multiple medical problems not a candidate for aggressive treatment otherwise -Continue PPI   Acute kidney injury with mild right hydronephrosis, improving  -Seen by urology, Dr. Renda on 9/10, recommended outpatient follow-up once patient is optimized for consideration of PCNL.   - Cr now normalized     Diabetes mellitus type 2 CBG (last 3)  Continue moderate SSI every 4 hours,  cont insulin  Lantus  5 units daily -Glycemic trends stable   Moderate protein energy malnutrition -Continue tube feeds, SLP 9/15-> full liquid diet -Speech therapy following, advanced to dys 3 diet -Dietitian following. Now on nocturnal TF. Cont to wean and eventually wean off coretrak   Obesity class II Estimated body mass index is 38.39 kg/m as calculated from the following:   Height as of this encounter: 5' 2 (1.575 m).   Weight as of this encounter: 95.2 kg.         Subjective: No complaints this AM  Physical Exam: Vitals:   04/07/24 1100  04/07/24 1130 04/07/24 1200 04/07/24 1300  BP:  (!) 97/58    Pulse:  86    Resp: (!) 22  (!) 29 (!) 26  Temp:  98.2 F (36.8 C)    TempSrc:  Oral    SpO2:  98%    Weight:      Height:       General exam: Conversant, in  no acute distress Respiratory system: normal chest rise, clear, no audible wheezing Cardiovascular system: regular rhythm, s1-s2 Gastrointestinal system: Nondistended, nontender, pos BS Central nervous system: No seizures, no tremors Extremities: No cyanosis, no joint deformities Skin: No rashes, no pallor Psychiatry: Affect normal // no auditory hallucinations   Data Reviewed:  There are no new results to review at this time.  Family Communication: Pt in room, family at bedside  Disposition: Status is: Inpatient Remains inpatient appropriate because: severity of illness  Planned Discharge Destination: Skilled nursing facility    Author: Garnette Pelt, MD 04/07/2024 3:20 PM  For on call review [email or web address].

## 2024-04-08 DIAGNOSIS — A419 Sepsis, unspecified organism: Secondary | ICD-10-CM | POA: Diagnosis not present

## 2024-04-08 DIAGNOSIS — R6521 Severe sepsis with septic shock: Secondary | ICD-10-CM | POA: Diagnosis not present

## 2024-04-08 DIAGNOSIS — G9341 Metabolic encephalopathy: Secondary | ICD-10-CM | POA: Diagnosis not present

## 2024-04-08 DIAGNOSIS — N390 Urinary tract infection, site not specified: Secondary | ICD-10-CM | POA: Diagnosis not present

## 2024-04-08 LAB — CBC
HCT: 30.7 % — ABNORMAL LOW (ref 36.0–46.0)
Hemoglobin: 9.2 g/dL — ABNORMAL LOW (ref 12.0–15.0)
MCH: 27.5 pg (ref 26.0–34.0)
MCHC: 30 g/dL (ref 30.0–36.0)
MCV: 91.9 fL (ref 80.0–100.0)
Platelets: 221 K/uL (ref 150–400)
RBC: 3.34 MIL/uL — ABNORMAL LOW (ref 3.87–5.11)
RDW: 15.5 % (ref 11.5–15.5)
WBC: 5.6 K/uL (ref 4.0–10.5)
nRBC: 0.7 % — ABNORMAL HIGH (ref 0.0–0.2)

## 2024-04-08 LAB — COMPREHENSIVE METABOLIC PANEL WITH GFR
ALT: 15 U/L (ref 0–44)
AST: 14 U/L — ABNORMAL LOW (ref 15–41)
Albumin: 1.7 g/dL — ABNORMAL LOW (ref 3.5–5.0)
Alkaline Phosphatase: 50 U/L (ref 38–126)
Anion gap: 8 (ref 5–15)
BUN: 8 mg/dL (ref 8–23)
CO2: 22 mmol/L (ref 22–32)
Calcium: 8.1 mg/dL — ABNORMAL LOW (ref 8.9–10.3)
Chloride: 111 mmol/L (ref 98–111)
Creatinine, Ser: 0.36 mg/dL — ABNORMAL LOW (ref 0.44–1.00)
GFR, Estimated: >60 mL/min (ref >60)
Glucose, Bld: 232 mg/dL — ABNORMAL HIGH (ref 70–99)
Potassium: 3.4 mmol/L — ABNORMAL LOW (ref 3.5–5.1)
Sodium: 141 mmol/L (ref 135–145)
Total Bilirubin: 0.3 mg/dL (ref 0.0–1.2)
Total Protein: 5.1 g/dL — ABNORMAL LOW (ref 6.5–8.1)

## 2024-04-08 LAB — GLUCOSE, CAPILLARY
Glucose-Capillary: 241 mg/dL — ABNORMAL HIGH (ref 70–99)
Glucose-Capillary: 206 mg/dL — ABNORMAL HIGH (ref 70–99)
Glucose-Capillary: 158 mg/dL — ABNORMAL HIGH (ref 70–99)
Glucose-Capillary: 93 mg/dL (ref 70–99)
Glucose-Capillary: 163 mg/dL — ABNORMAL HIGH (ref 70–99)
Glucose-Capillary: 219 mg/dL — ABNORMAL HIGH (ref 70–99)

## 2024-04-08 LAB — MAGNESIUM: Magnesium: 1.7 mg/dL (ref 1.7–2.4)

## 2024-04-08 LAB — PHOSPHORUS: Phosphorus: 3.1 mg/dL (ref 2.5–4.6)

## 2024-04-08 MED ORDER — POTASSIUM CHLORIDE CRYS ER 20 MEQ PO TBCR
60.0000 meq | EXTENDED_RELEASE_TABLET | Freq: Once | ORAL | Status: AC
  Administered 2024-04-08: 60 meq via ORAL
  Filled 2024-04-08: qty 3

## 2024-04-08 MED ORDER — LACTULOSE 10 GM/15ML PO SOLN
20.0000 g | Freq: Two times a day (BID) | ORAL | Status: DC
  Administered 2024-04-08 – 2024-04-10 (×5): 20 g
  Filled 2024-04-08 (×5): qty 30

## 2024-04-08 MED ORDER — MAGNESIUM SULFATE 2 GM/50ML IV SOLN
2.0000 g | Freq: Once | INTRAVENOUS | Status: AC
  Administered 2024-04-08: 2 g via INTRAVENOUS
  Filled 2024-04-08: qty 50

## 2024-04-08 NOTE — Plan of Care (Signed)
 Patient having frequent bowel moment from Lactulose .  Difficult to maintain skin integrity.  Using Desitin and cream to protect skin. Midodrine  started to help increase BP.  Orientation remains good.

## 2024-04-08 NOTE — Progress Notes (Signed)
 Progress Note   Patient: Toni [name] FMW:[id number] DOB: 1933-02-14 DOA: 03/27/2024     11 DOS: the patient was seen and examined on 04/08/2024   Brief hospital course: 89 y.o. female  with past medical history of chronic HFpEF, diabetes type 2, peripheral artery disease, cirrhosis, and HTN who presented to the ED on 03/27/2024 with nausea/vomiting, lethargy, and weakness. She is typically alert but was progressively confused over the course of the day as well. All history is obtained from chart 2/2 pt's acute encephalopathy. Pt has not had a bowel movement for 5 days and has been suffering from abdominal distention and pain at presentation. She is admitted with sepsis secondary to UTI and colitis, AKI, lactic acidosis, metabolic acidosis, acute metabolic encephalopathy, and new onset A-fib. Pt reportedly has frequent uti's in past. UA pending. Lactate elevated on presentation but improving to 3 with volume. Ultimately, she required initiation of norepi to maintain BP. Found to be in aki with Cr 1.5 (baseline 0.7) with some hydronephrosis 2/2 R staghorn stone. There is also stigmata of espophagitis and colitis.   Assessment and Plan: Sepsis 2/2 UTI- likely pyelonephritis and colitis; shock resolved E. Coli & Proteus mirabilis UTI -E. coli resistant to ampicillin  and Proteus resistant to Macrodantin, has been de-escalated to ceftriaxone  based on susceptibilities -Continue midodrine  5 mg 3 times daily, wean off as tolerated - Was found to have staghorn calculus in the right kidney without any obstructive processes although did have mild right hydronephrosis and dilatation of the proximal right ureter on ultrasound and CT imaging   Chronic HFpEF  -ECHO  EF 70-75%, hyperdynamic, severe MR calcifications with associated murmur -Currently no acute issues, follow closely for any volume overload,   Acute hypokalemia -Recurrent problem, continue daily placement    Atrial fibrillation, new-onset -Now in  NSR, likely due to septic shock  - CHA2DS2-VASc 2: 6-if A-fib returns, will need GOC regarding anticoagulation   Cirrhosis/Hyperammonemia -Noted on CT abd/pelvis -Continue lactulose , alert and conversant -reduced dose to 20mg  bid given ample stool   Normocytic anemia H&H stable   Constipation -Found to have large stool ball in the rectum with mild rectal wall thickening, stercoral colitis -Now having BM, decreased lactulose  per above     Esophagitis, chronic  CT revealed marked wall thickening of the distal esophagus, esophagitis.  Infiltrative mass has not been excluded - No GI bleed.  Palliative medicine following for GOC - Per palliative medicine, family wishes to continue supportive care while hoping for improvement. Given advanced age and multiple medical problems not a candidate for aggressive treatment otherwise -Continue PPI   Acute kidney injury with mild right hydronephrosis, improving  -Seen by urology, Dr. Renda on 9/10, recommended outpatient follow-up once patient is optimized for consideration of PCNL.   - Cr now normalized     Diabetes mellitus type 2 CBG (last 3)  Continue moderate SSI every 4 hours,  cont insulin  Lantus  5 units daily -Glycemic trends stable   Moderate protein energy malnutrition -Continue tube feeds, SLP 9/15-> full liquid diet -Speech therapy following, advanced to dys 3 diet -Dietitian following. Now on nocturnal TF. Cont to wean and eventually wean off coretrak   Obesity class II Estimated body mass index is 38.39 kg/m as calculated from the following:   Height as of this encounter: 5' 2 (1.575 m).   Weight as of this encounter: 95.2 kg.      Subjective: Without complaints  Physical Exam: Vitals:   04/08/24 0400 04/08/24 0617  04/08/24 0800 04/08/24 1217  BP:   (!) 124/56 113/68  Pulse: 100  88   Resp: 20 18 20    Temp:      TempSrc:    Axillary  SpO2:   100%   Weight:      Height:       General exam: Awake, laying in  bed, in nad Respiratory system: Normal respiratory effort, no wheezing Cardiovascular system: regular rate, s1, s2 Gastrointestinal system: Soft, nondistended, positive BS Central nervous system: CN2-12 grossly intact, strength intact Extremities: Perfused, no clubbing Skin: Normal skin turgor, no notable skin lesions seen Psychiatry: Mood normal // no visual hallucinations   Data Reviewed:  Labs reviewed: Na 141, K 3.4, Cr 0.36, WBC 5.6, Hgb 9.2, Plts 221  Family Communication: Pt in room, family not at bedside  Disposition: Status is: Inpatient Remains inpatient appropriate because: severity of illness  Planned Discharge Destination: Skilled nursing facility    Author: Garnette Pelt, MD 04/08/2024 3:03 PM  For on call review [email or web address].

## 2024-04-09 DIAGNOSIS — R6521 Severe sepsis with septic shock: Secondary | ICD-10-CM | POA: Diagnosis not present

## 2024-04-09 DIAGNOSIS — G9341 Metabolic encephalopathy: Secondary | ICD-10-CM | POA: Diagnosis not present

## 2024-04-09 DIAGNOSIS — I4891 Unspecified atrial fibrillation: Secondary | ICD-10-CM | POA: Diagnosis not present

## 2024-04-09 DIAGNOSIS — A419 Sepsis, unspecified organism: Secondary | ICD-10-CM | POA: Diagnosis not present

## 2024-04-09 LAB — BASIC METABOLIC PANEL WITH GFR
Anion gap: 10 (ref 5–15)
BUN: 7 mg/dL — ABNORMAL LOW (ref 8–23)
CO2: 20 mmol/L — ABNORMAL LOW (ref 22–32)
Calcium: 7.8 mg/dL — ABNORMAL LOW (ref 8.9–10.3)
Chloride: 109 mmol/L (ref 98–111)
Creatinine, Ser: <0.3 mg/dL — ABNORMAL LOW (ref 0.44–1.00)
Glucose, Bld: 81 mg/dL (ref 70–99)
Potassium: 3.5 mmol/L (ref 3.5–5.1)
Sodium: 139 mmol/L (ref 135–145)

## 2024-04-09 LAB — GLUCOSE, CAPILLARY
Glucose-Capillary: 192 mg/dL — ABNORMAL HIGH (ref 70–99)
Glucose-Capillary: 276 mg/dL — ABNORMAL HIGH (ref 70–99)
Glucose-Capillary: 140 mg/dL — ABNORMAL HIGH (ref 70–99)
Glucose-Capillary: 102 mg/dL — ABNORMAL HIGH (ref 70–99)
Glucose-Capillary: 89 mg/dL (ref 70–99)
Glucose-Capillary: 153 mg/dL — ABNORMAL HIGH (ref 70–99)
Glucose-Capillary: 132 mg/dL — ABNORMAL HIGH (ref 70–99)

## 2024-04-09 LAB — MAGNESIUM
Magnesium: 1.9 mg/dL (ref 1.7–2.4)
Magnesium: 2 mg/dL (ref 1.7–2.4)

## 2024-04-09 LAB — CBC WITH DIFFERENTIAL/PLATELET
Basophils Absolute: 0.1 K/uL (ref 0.0–0.1)
Basophils Relative: 1 %
Eosinophils Absolute: 0 K/uL (ref 0.0–0.5)
Eosinophils Relative: 0 %
HCT: 32.9 % — ABNORMAL LOW (ref 36.0–46.0)
Hemoglobin: 9.8 g/dL — ABNORMAL LOW (ref 12.0–15.0)
Lymphocytes Relative: 14 %
Lymphs Abs: 0.9 K/uL (ref 0.7–4.0)
MCH: 27.3 pg (ref 26.0–34.0)
MCHC: 29.8 g/dL — ABNORMAL LOW (ref 30.0–36.0)
MCV: 91.6 fL (ref 80.0–100.0)
Monocytes Absolute: 0.8 K/uL (ref 0.1–1.0)
Monocytes Relative: 12 %
Neutro Abs: 4.7 K/uL (ref 1.7–7.7)
Neutrophils Relative %: 73 %
Platelets: 329 K/uL (ref 150–400)
RBC: 3.59 MIL/uL — ABNORMAL LOW (ref 3.87–5.11)
RDW: 15.4 % (ref 11.5–15.5)
WBC: 6.4 K/uL (ref 4.0–10.5)
nRBC: 0.3 % — ABNORMAL HIGH (ref 0.0–0.2)

## 2024-04-09 LAB — COMPREHENSIVE METABOLIC PANEL WITH GFR
ALT: 14 U/L (ref 0–44)
AST: 15 U/L (ref 15–41)
Albumin: 1.8 g/dL — ABNORMAL LOW (ref 3.5–5.0)
Alkaline Phosphatase: 59 U/L (ref 38–126)
Anion gap: 10 (ref 5–15)
BUN: 8 mg/dL (ref 8–23)
CO2: 21 mmol/L — ABNORMAL LOW (ref 22–32)
Calcium: 7.9 mg/dL — ABNORMAL LOW (ref 8.9–10.3)
Chloride: 108 mmol/L (ref 98–111)
Creatinine, Ser: 0.36 mg/dL — ABNORMAL LOW (ref 0.44–1.00)
GFR, Estimated: >60 mL/min (ref >60)
Glucose, Bld: 79 mg/dL (ref 70–99)
Potassium: 3.4 mmol/L — ABNORMAL LOW (ref 3.5–5.1)
Sodium: 139 mmol/L (ref 135–145)
Total Bilirubin: 0.6 mg/dL (ref 0.0–1.2)
Total Protein: 5.9 g/dL — ABNORMAL LOW (ref 6.5–8.1)

## 2024-04-09 MED ORDER — INSULIN GLARGINE 100 UNIT/ML ~~LOC~~ SOLN
10.0000 [IU] | Freq: Every day | SUBCUTANEOUS | Status: DC
  Administered 2024-04-09 – 2024-04-16 (×8): 10 [IU] via SUBCUTANEOUS
  Filled 2024-04-09 (×11): qty 0.1

## 2024-04-09 NOTE — Progress Notes (Signed)
 Progress Note   Patient: Toni [name] FMW:[id number] DOB: 09-24-1932 DOA: 03/27/2024     12 DOS: the patient was seen and examined on 04/09/2024   Brief hospital course: 89 y.o. female  with past medical history of chronic HFpEF, diabetes type 2, peripheral artery disease, cirrhosis, and HTN who presented to the ED on 03/27/2024 with nausea/vomiting, lethargy, and weakness. She is typically alert but was progressively confused over the course of the day as well. All history is obtained from chart 2/2 pt's acute encephalopathy. Pt has not had a bowel movement for 5 days and has been suffering from abdominal distention and pain at presentation. She is admitted with sepsis secondary to UTI and colitis, AKI, lactic acidosis, metabolic acidosis, acute metabolic encephalopathy, and new onset A-fib. Pt reportedly has frequent uti's in past. UA pending. Lactate elevated on presentation but improving to 3 with volume. Ultimately, she required initiation of norepi to maintain BP. Found to be in aki with Cr 1.5 (baseline 0.7) with some hydronephrosis 2/2 R staghorn stone. There is also stigmata of espophagitis and colitis.   Assessment and Plan: Sepsis 2/2 UTI- likely pyelonephritis and colitis; shock resolved E. Coli & Proteus mirabilis UTI -E. coli resistant to ampicillin  and Proteus resistant to Macrodantin, has been de-escalated to ceftriaxone  based on susceptibilities -Continue midodrine  5 mg 3 times daily, wean off as tolerated - Was found to have staghorn calculus in the right kidney without any obstructive processes although did have mild right hydronephrosis and dilatation of the proximal right ureter on ultrasound and CT imaging   Chronic HFpEF  -ECHO  EF 70-75%, hyperdynamic, severe MR calcifications with associated murmur -Currently no acute issues, follow closely for any volume overload,   Acute hypokalemia -Recurrent problem, continue daily placement    Atrial fibrillation, new-onset -Now in  NSR, likely due to septic shock  - CHA2DS2-VASc 2: 6-if A-fib returns, will need GOC regarding anticoagulation   Cirrhosis/Hyperammonemia -Noted on CT abd/pelvis -Continue lactulose , alert and conversant -continued on 20mg  bid lactulose . Noted 3 BM over past 24h   Normocytic anemia H&H stable   Constipation -Found to have large stool ball in the rectum with mild rectal wall thickening, stercoral colitis -Now having BM, decreased lactulose  per above     Esophagitis, chronic  CT revealed marked wall thickening of the distal esophagus, esophagitis.  Infiltrative mass has not been excluded - No GI bleed.  Palliative medicine following for GOC - Per palliative medicine, family wishes to continue supportive care while hoping for improvement. Given advanced age and multiple medical problems not a candidate for aggressive treatment otherwise -Continue PPI   Acute kidney injury with mild right hydronephrosis, improving  -Seen by urology, Dr. Renda on 9/10, recommended outpatient follow-up once patient is optimized for consideration of PCNL.   - Cr now normalized     Diabetes mellitus type 2 CBG (last 3)  Continue moderate SSI every 4 hours,  cont insulin  Lantus  5 units daily -Glycemic trends stable   Moderate protein energy malnutrition -Continue tube feeds, SLP 9/15-> full liquid diet -Speech therapy following, advanced to dys 3 diet -Dietitian following. Goal is to eventually wean off TF and remove coretrak   Obesity class II Estimated body mass index is 38.39 kg/m as calculated from the following:   Height as of this encounter: 5' 2 (1.575 m).   Weight as of this encounter: 95.2 kg.      Subjective: Complains of air mattress feeling uncomfortable to lay in  Physical  Exam: Vitals:   04/09/24 0426 04/09/24 0431 04/09/24 0500 04/09/24 1110  BP: 112/63   118/67  Pulse: (!) 117 (!) 116  (!) 103  Resp: 20 20  (!) 34  Temp: 98.9 F (37.2 C)   97.6 F (36.4 C)  TempSrc:  Oral   Oral  SpO2: 100% 100%  98%  Weight:   94 kg   Height:       General exam: Conversant, in no acute distress Respiratory system: normal chest rise, clear, no audible wheezing Cardiovascular system: regular rhythm, s1-s2 Gastrointestinal system: Nondistended, nontender, pos BS Central nervous system: No seizures, no tremors Extremities: No cyanosis, no joint deformities Skin: No rashes, no pallor Psychiatry: Affect normal // no auditory hallucinations   Data Reviewed:  Results are pending, will review when available.  Family Communication: Pt in room, family not at bedside  Disposition: Status is: Inpatient Remains inpatient appropriate because: severity of illness  Planned Discharge Destination: Skilled nursing facility    Author: Garnette Pelt, MD 04/09/2024 2:31 PM  For on call review [email or web address].

## 2024-04-09 NOTE — TOC Progression Note (Signed)
 Transition of Care [facility]) - Progression Note    Patient Details  Name: Toni [name] MRN: [id number] Date of Birth: 1932-09-21  [facility] CM/SW Contact  Luise [name], [place] Phone Number: 04/09/2024, 9:05 AM  Clinical Narrative:   Patient currently has a cortrak. CSW will fax referrals for SNF once there are plans for cortrak removal.   Expected Discharge Plan: Skilled Nursing Facility Barriers to Discharge: Continued Medical Work up               Expected Discharge Plan and Services In-house Referral: Clinical Social Work     Living arrangements for the past 2 months: Single Family Home                                       Social Drivers of Health (SDOH) Interventions SDOH Screenings   Food Insecurity: Patient Unable To Answer (03/30/2024)  Housing: Patient Unable To Answer (03/30/2024)  Transportation Needs: Patient Unable To Answer (03/30/2024)  Utilities: Patient Unable To Answer (03/30/2024)  Social Connections: Patient Unable To Answer (03/30/2024)  Tobacco Use: Low Risk  (03/27/2024)    Readmission Risk Interventions     No data to display

## 2024-04-09 NOTE — Plan of Care (Signed)

## 2024-04-09 NOTE — Progress Notes (Signed)
 Speech Language Pathology Treatment: Dysphagia  Patient Details Name: Toni [name] MRN: [id number] DOB: [birth year] Today's Date: 04/09/2024 Time: 1213-1225 SLP Time Calculation (min) (ACUTE ONLY): 12 min  Assessment / Plan / Recommendation Clinical Impression  Pt more alert than in previous sessions and feeding herself with set up assistance. She took a bite of mechanical soft meatloaf from her meal tray, requiring extensively prolonged mastication. She ultimately clears her oral cavity with a liquid wash. She declined further trials of solids after this initial bite. Recommend downgrading to Dys 2 solids and continuing thin liquids to promote energy conservation throughout the course of a meal. SLP will f/u at least briefly though do not anticipate post-acute dysphagia needs.    HPI HPI: SHALECE STAFFA is an 89 yo female with PMH including HFpEF, T2DM, HTN, obesity, and cirrhosis presenting to ED 9/9 with coffee ground emesis, hypotension, and weakness. Admitted with sepsis 2/2 UTI and AKI with mild R hydronephrosis.      SLP Plan  Continue with current plan of care          Recommendations  Diet recommendations: Dysphagia 2 (fine chop);Thin liquid Liquids provided via: Cup;Straw Medication Administration: Whole meds with liquid Supervision: Staff to assist with self feeding;Full supervision/cueing for compensatory strategies;Trained caregiver to feed patient Compensations: Minimize environmental distractions;Slow rate;Small sips/bites Postural Changes and/or Swallow Maneuvers: Seated upright 90 degrees                  Oral care BID   Frequent or constant Supervision/Assistance Dysphagia, unspecified (R13.10)     Continue with current plan of care     Damien Blumenthal, M.A., CCC-SLP Speech Language Pathology, Acute Rehabilitation Services  Secure Chat preferred [phone]   04/09/2024, 1:19 PM

## 2024-04-10 ENCOUNTER — Inpatient Hospital Stay (HOSPITAL_COMMUNITY)

## 2024-04-10 DIAGNOSIS — R6521 Severe sepsis with septic shock: Secondary | ICD-10-CM | POA: Diagnosis not present

## 2024-04-10 DIAGNOSIS — I4891 Unspecified atrial fibrillation: Secondary | ICD-10-CM | POA: Diagnosis not present

## 2024-04-10 DIAGNOSIS — A419 Sepsis, unspecified organism: Secondary | ICD-10-CM | POA: Diagnosis not present

## 2024-04-10 DIAGNOSIS — G9341 Metabolic encephalopathy: Secondary | ICD-10-CM | POA: Diagnosis not present

## 2024-04-10 LAB — CBC
HCT: 34.5 % — ABNORMAL LOW (ref 36.0–46.0)
Hemoglobin: 10.1 g/dL — ABNORMAL LOW (ref 12.0–15.0)
MCH: 27.4 pg (ref 26.0–34.0)
MCHC: 29.3 g/dL — ABNORMAL LOW (ref 30.0–36.0)
MCV: 93.5 fL (ref 80.0–100.0)
Platelets: 296 K/uL (ref 150–400)
RBC: 3.69 MIL/uL — ABNORMAL LOW (ref 3.87–5.11)
RDW: 15.6 % — ABNORMAL HIGH (ref 11.5–15.5)
WBC: 5.5 K/uL (ref 4.0–10.5)
nRBC: 0.4 % — ABNORMAL HIGH (ref 0.0–0.2)

## 2024-04-10 LAB — COMPREHENSIVE METABOLIC PANEL WITH GFR
ALT: 15 U/L (ref 0–44)
AST: 16 U/L (ref 15–41)
Albumin: 1.8 g/dL — ABNORMAL LOW (ref 3.5–5.0)
Alkaline Phosphatase: 55 U/L (ref 38–126)
Anion gap: 12 (ref 5–15)
BUN: 8 mg/dL (ref 8–23)
CO2: 20 mmol/L — ABNORMAL LOW (ref 22–32)
Calcium: 8.3 mg/dL — ABNORMAL LOW (ref 8.9–10.3)
Chloride: 108 mmol/L (ref 98–111)
Creatinine, Ser: 0.37 mg/dL — ABNORMAL LOW (ref 0.44–1.00)
GFR, Estimated: >60 mL/min (ref >60)
Glucose, Bld: 243 mg/dL — ABNORMAL HIGH (ref 70–99)
Potassium: 3.5 mmol/L (ref 3.5–5.1)
Sodium: 140 mmol/L (ref 135–145)
Total Bilirubin: 0.4 mg/dL (ref 0.0–1.2)
Total Protein: 5.7 g/dL — ABNORMAL LOW (ref 6.5–8.1)

## 2024-04-10 LAB — GLUCOSE, CAPILLARY
Glucose-Capillary: 288 mg/dL — ABNORMAL HIGH (ref 70–99)
Glucose-Capillary: 244 mg/dL — ABNORMAL HIGH (ref 70–99)
Glucose-Capillary: 181 mg/dL — ABNORMAL HIGH (ref 70–99)
Glucose-Capillary: 192 mg/dL — ABNORMAL HIGH (ref 70–99)
Glucose-Capillary: 218 mg/dL — ABNORMAL HIGH (ref 70–99)

## 2024-04-10 MED ORDER — LACTULOSE 10 GM/15ML PO SOLN
10.0000 g | Freq: Two times a day (BID) | ORAL | Status: DC
  Administered 2024-04-10 – 2024-04-11 (×3): 10 g
  Filled 2024-04-10 (×3): qty 15

## 2024-04-10 NOTE — TOC Progression Note (Signed)
 Transition of Care [facility]) - Progression Note    Patient Details  Name: Toni [name] MRN: [id number] Date of Birth: [birth year]  Transition of [facility]) CM/SW Contact  Luise [name], [place] Phone Number: 04/10/2024, 11:42 AM  Clinical Narrative:   Per FC, patients income exceeds income level for LTC medicaid.   Patient currently has a cortrak. CSW will fax referrals for SNF once there are plans for cortrak removal.   CSW will continue to follow.    Expected Discharge Plan: Skilled Nursing Facility Barriers to Discharge: Continued Medical Work up               Expected Discharge Plan and Services In-house Referral: Clinical Social Work     Living arrangements for the past 2 months: Single Family Home                                       Social Drivers of Health (SDOH) Interventions SDOH Screenings   Food Insecurity: Patient Unable To Answer (03/30/2024)  Housing: Patient Unable To Answer (03/30/2024)  Transportation Needs: Patient Unable To Answer (03/30/2024)  Utilities: Patient Unable To Answer (03/30/2024)  Social Connections: Patient Unable To Answer (03/30/2024)  Tobacco Use: Low Risk  (03/27/2024)    Readmission Risk Interventions     No data to display

## 2024-04-10 NOTE — Progress Notes (Signed)
 Progress Note   Patient: Toni [name] FMW:[id number] DOB: 28-Apr-1933 DOA: 03/27/2024     13 DOS: the patient was seen and examined on 04/10/2024   Brief hospital course: 89 y.o. female  with past medical history of chronic HFpEF, diabetes type 2, peripheral artery disease, cirrhosis, and HTN who presented to the ED on 03/27/2024 with nausea/vomiting, lethargy, and weakness. She is typically alert but was progressively confused over the course of the day as well. All history is obtained from chart 2/2 pt's acute encephalopathy. Pt has not had a bowel movement for 5 days and has been suffering from abdominal distention and pain at presentation. She is admitted with sepsis secondary to UTI and colitis, AKI, lactic acidosis, metabolic acidosis, acute metabolic encephalopathy, and new onset A-fib. Pt reportedly has frequent uti's in past. UA pending. Lactate elevated on presentation but improving to 3 with volume. Ultimately, she required initiation of norepi to maintain BP. Found to be in aki with Cr 1.5 (baseline 0.7) with some hydronephrosis 2/2 R staghorn stone. There is also stigmata of espophagitis and colitis.   Assessment and Plan: Sepsis 2/2 UTI- likely pyelonephritis and colitis; shock resolved E. Coli & Proteus mirabilis UTI -E. coli resistant to ampicillin  and Proteus resistant to Macrodantin, has been de-escalated to ceftriaxone  based on susceptibilities -Continue midodrine  5 mg 3 times daily, wean off as tolerated. BP remains stable - Was found to have staghorn calculus in the right kidney without any obstructive processes although did have mild right hydronephrosis and dilatation of the proximal right ureter on ultrasound and CT imaging   Chronic HFpEF  -ECHO  EF 70-75%, hyperdynamic, severe MR calcifications with associated murmur -Currently no acute issues, follow closely for any volume overload -Appears more tachypneic this am. Ordered cxr. No florid edema, however incidental lucencies  over both hemidiaphragms favoring distended bowel. Ordered f/u L lateral decub abd xray to r/o pneumoperitoneum   Acute hypokalemia -Recurrent problem, continue daily placement    Atrial fibrillation, new-onset -Now in NSR, likely due to septic shock  - CHA2DS2-VASc 2: 6-if A-fib returns, will need GOC regarding anticoagulation   Cirrhosis/Hyperammonemia -Noted on CT abd/pelvis -Continue lactulose , alert and conversant -noted 6 bowel movements over past 24h. Will reduce lactulose  to 10g bid   Normocytic anemia H&H stable   Constipation -Found to have large stool ball in the rectum with mild rectal wall thickening, stercoral colitis -Now having BM, decreased lactulose  per above     Esophagitis, chronic  CT revealed marked wall thickening of the distal esophagus, esophagitis.  Infiltrative mass has not been excluded - No GI bleed.  Palliative medicine following for GOC - Per palliative medicine, family wishes to continue supportive care while hoping for improvement. Given advanced age and multiple medical problems not a candidate for aggressive treatment otherwise -Continue PPI   Acute kidney injury with mild right hydronephrosis, improving  -Seen by urology, Dr. Renda on 9/10, recommended outpatient follow-up once patient is optimized for consideration of PCNL.   - Cr now normalized     Diabetes mellitus type 2 CBG (last 3)  Continue moderate SSI every 4 hours,  cont insulin  Lantus  5 units daily -Glycemic trends stable   Moderate protein energy malnutrition -Continue tube feeds, SLP 9/15-> full liquid diet -Speech therapy following, advanced to dys 3 diet -Dietitian following. Goal is to eventually wean off TF and remove coretrak   Obesity class II Estimated body mass index is 38.39 kg/m as calculated from the following:   Height  as of this encounter: 5' 2 (1.575 m).   Weight as of this encounter: 95.2 kg.      Subjective: Without complaints this AM  Physical  Exam: Vitals:   04/10/24 0454 04/10/24 0722 04/10/24 0808 04/10/24 1118  BP: 108/68 120/71 105/69   Pulse: (!) 107 (!) 106    Resp: 20 (!) 23 (!) 26   Temp: 98 F (36.7 C) 98 F (36.7 C) 98.2 F (36.8 C)   TempSrc: Oral Oral Oral Oral  SpO2: 98% 97%    Weight: 93 kg     Height:       General exam: Conversant, in no acute distress Respiratory system: normal chest rise, clear, no audible wheezing Cardiovascular system: regular rhythm, s1-s2 Gastrointestinal system: Nondistended, nontender, pos BS Central nervous system: No seizures, no tremors Extremities: No cyanosis, no joint deformities Skin: No rashes, no pallor Psychiatry: Affect normal // mood seems normal  Data Reviewed:  Labs reviewed: Na 140, K 3.5, Cr 0.37, WBC 5.5, Hgb 10.1, Plts 296  Family Communication: Pt in room, family not at bedside  Disposition: Status is: Inpatient Remains inpatient appropriate because: severity of illness  Planned Discharge Destination: Skilled nursing facility    Author: Garnette Pelt, MD 04/10/2024 3:44 PM  For on call review [email or web address].

## 2024-04-10 NOTE — Progress Notes (Signed)
 Nutrition Follow-up  DOCUMENTATION CODES:   Non-severe (moderate) malnutrition in context of acute illness/injury, Obesity unspecified  INTERVENTION:  -Dysphagia 2 diet  -Ensure Plus High Protein po BID, each supplement provides 350 kcal and 20 grams of protein.  -Change to Vital 1.5 continuous NOC feed @ goal rate 70 ml/hr via Cortrak tube x 12 hrs/day from 1800-0600              At goal this provides 1260 kcal, 57 g protein, 642 ml fluid  -Sodium 140, MD order for 125 ml flushes q-8, recommend 125 ml q-2 during NOC feed. Formula + flushes would equal 1392 ml  -Continue to monitor electrolytes and replete per MD  - 48 hr calorie count to assess adequacy of PO intake    NUTRITION DIAGNOSIS:   Moderate Malnutrition related to acute illness as evidenced by mild muscle depletion, energy intake < or equal to 50% for > or equal to 5 days.  Ongoing  GOAL:   Patient will meet greater than or equal to 90% of their needs  Progressing   MONITOR:   Diet advancement, TF tolerance, Weight trends, Labs  REASON FOR ASSESSMENT:   Consult Enteral/tube feeding initiation and management, Assessment of nutrition requirement/status  ASSESSMENT:   89 yo female admitted with sepsis with UTI and colitis, +shock. Pt with increasing weakness and lethargy with development of N/V with constipation for about 1 week PTA; +AKI. PMH includes HFpEF, DM, HTN, obesity, cirrhosis  Patient seen in room, resting. Cortrak in place, currently has nocturnal feeds ordered. New consult ordered re: When is a good time to wean off TF? Diet downgraded to DYS 2 yesterday. PO intakes documented 0-25% for the last 3 days. Untouched breakfast tray at bedside. Will place 48 hr calorie count to assess PO intake. Recommend GOC disscussion with palliative care regarding PEG placement for long term nutrition if patient fails calorie count.    Labs Glucose 243  CBG 89-288 Calcium 8.3  Medications  Chlorhexidine   Gluconate Cloth  6 each Topical Daily   feeding supplement  237 mL Oral BID BM   feeding supplement (VITAL 1.5 CAL)  840 mL Per Tube Daily   free water   125 mL Per Tube 6 X Daily   Gerhardt's butt cream   Topical Daily   heparin   5,000 Units Subcutaneous Q8H   insulin  aspart  0-15 Units Subcutaneous Q4H   insulin  glargine  10 Units Subcutaneous Daily   lactulose   20 g Per Tube BID   liver oil-zinc  oxide   Topical BID   metoCLOPramide  (REGLAN ) injection  10 mg Intravenous Q8H   midodrine   5 mg Per Tube Q8H   mouth rinse  15 mL Mouth Rinse 4 times per day   pantoprazole   40 mg Oral Daily   phosphorus  500 mg Per Tube BID     NUTRITION - FOCUSED PHYSICAL EXAM:  Flowsheet Row Most Recent Value  Orbital Region No depletion  Upper Arm Region No depletion  Thoracic and Lumbar Region Unable to assess  Buccal Region Mild depletion  Temple Region Mild depletion  Clavicle Bone Region Mild depletion  Clavicle and Acromion Bone Region Mild depletion  Scapular Bone Region Mild depletion  Dorsal Hand Moderate depletion  Patellar Region Moderate depletion  Anterior Thigh Region Moderate depletion  Posterior Calf Region Moderate depletion  Edema (RD Assessment) Mild  Hair Reviewed  Eyes Reviewed  Mouth Unable to assess  [Pt indicated she could not open her mouth at this  time]  Skin Other (Comment)  [dry flaky/scaly skin]  Nails Reviewed    Diet Order:   Diet Order             DIET DYS 2 Room service appropriate? No; Fluid consistency: Thin  Diet effective now                   EDUCATION NEEDS:   Education needs have been addressed  Skin:  Skin Assessment: Skin Integrity Issues: Skin Integrity Issues:: Stage I Stage I: Anus  Last BM:  9/18  Height:   Ht Readings from Last 1 Encounters:  03/28/24 5' 2 (1.575 m)    Weight:   Wt Readings from Last 1 Encounters:  04/10/24 93 kg    BMI:  Body mass index is 37.5 kg/m.  Estimated Nutritional Needs:   Kcal:   1550-1750 kcals  Protein:  90-110 g  Fluid:  >/=1.7 L  Orenthal Debski, MS, RD, LDN Clinical Dietitian  Please see AMiON for contact information.

## 2024-04-11 DIAGNOSIS — N10 Acute tubulo-interstitial nephritis: Secondary | ICD-10-CM

## 2024-04-11 DIAGNOSIS — R6521 Severe sepsis with septic shock: Secondary | ICD-10-CM | POA: Diagnosis not present

## 2024-04-11 DIAGNOSIS — A419 Sepsis, unspecified organism: Secondary | ICD-10-CM | POA: Diagnosis not present

## 2024-04-11 DIAGNOSIS — L8990 Pressure ulcer of unspecified site, unspecified stage: Secondary | ICD-10-CM | POA: Insufficient documentation

## 2024-04-11 LAB — GLUCOSE, CAPILLARY
Glucose-Capillary: 240 mg/dL — ABNORMAL HIGH (ref 70–99)
Glucose-Capillary: 260 mg/dL — ABNORMAL HIGH (ref 70–99)
Glucose-Capillary: 216 mg/dL — ABNORMAL HIGH (ref 70–99)
Glucose-Capillary: 124 mg/dL — ABNORMAL HIGH (ref 70–99)
Glucose-Capillary: 136 mg/dL — ABNORMAL HIGH (ref 70–99)
Glucose-Capillary: 228 mg/dL — ABNORMAL HIGH (ref 70–99)

## 2024-04-11 NOTE — Progress Notes (Signed)
 Progress Note    Toni [name]   FMW:[id number]  DOB: [birth year]  DOA: 03/27/2024     14 PCP: Patient, No Pcp Per  Initial CC: weakness, lethargy, N/V  Hospital Course: 89 y.o. female with past medical history of chronic HFpEF, diabetes type 2, peripheral artery disease, cirrhosis, and HTN who presented to the ED on 03/27/2024 with nausea/vomiting, lethargy, and weakness. She is typically alert but was progressively confused over the course of the day as well.  All history is obtained from chart 2/2 pt's acute encephalopathy.  She is admitted with sepsis secondary to UTI and colitis, AKI, lactic acidosis, metabolic acidosis, acute metabolic encephalopathy, and new onset A-fib. Pt reportedly has frequent uti's in past. UA pending. Lactate elevated on presentation but improving to 3 with volume. Ultimately, she required initiation of norepi to maintain BP. Found to be in aki with Cr 1.5 (baseline 0.7) with some hydronephrosis 2/2 R staghorn stone. There is also stigmata of espophagitis and colitis.   Assessment and Plan:  Sepsis 2/2 UTI- likely pyelonephritis and colitis; shock resolved E. Coli & Proteus mirabilis UTI -E. coli resistant to ampicillin  and Proteus resistant to Macrodantin, has been de-escalated to ceftriaxone  based on susceptibilities -Continue midodrine  5 mg 3 times daily, wean off as tolerated. BP remains stable - Was found to have staghorn calculus in the right kidney without any obstructive processes although did have mild right hydronephrosis and dilatation of the proximal right ureter on ultrasound and CT imaging   Chronic HFpEF  -ECHO  EF 70-75%, hyperdynamic, severe MR calcifications with associated murmur -Currently no acute issues, follow closely for any volume overload - Abdominal x-ray from 04/10/2024 shows gaseous distention of bowel and no visible free air   Acute hypokalemia - Replete as needed   Atrial fibrillation, new-onset -Now in NSR, likely due to  septic shock  - CHA2DS2-VASc 2: 6-if A-fib returns, will need GOC regarding anticoagulation   Cirrhosis/Hyperammonemia -Noted on CT abd/pelvis -Continue lactulose , alert and conversant   Normocytic anemia H&H stable   Constipation -Found to have large stool ball in the rectum with mild rectal wall thickening, stercoral colitis -Now having BM, decreased lactulose  per above   Esophagitis, chronic  CT revealed marked wall thickening of the distal esophagus, esophagitis.  Infiltrative mass has not been excluded - No GI bleed.  Palliative medicine following for GOC - Per palliative medicine, family wishes to continue supportive care while hoping for improvement. Given advanced age and multiple medical problems not a candidate for aggressive treatment otherwise -Continue PPI   Acute kidney injury with mild right hydronephrosis, improving  -Seen by urology, Dr. Renda on 9/10, recommended outpatient follow-up once patient is optimized for consideration of PCNL.   - Cr now normalized   Diabetes mellitus type 2 Continue moderate SSI every 4 hours,  cont insulin  Lantus  5 units daily -Glycemic trends stable   Moderate protein energy malnutrition -Continue tube feeds, SLP 9/15-> full liquid diet -Speech therapy following, advanced to dys 3 diet -Dietitian following. Goal is to eventually wean off TF and remove coretrak   Obesity class II Estimated body mass index is 38.39 kg/m as calculated from the following:   Height as of this encounter: 5' 2 (1.575 m).   Weight as of this encounter: 95.2 kg  Interval History:  Resting comfortably in bed with family present bedside.  No concerns this morning.  Old records reviewed in assessment of this patient  Antimicrobials:   DVT prophylaxis:  heparin  injection  5,000 Units Start: 03/28/24 0600 SCDs Start: 03/28/24 0230   Code Status:   Code Status: Limited: Do not attempt resuscitation (DNR) -DNR-LIMITED -Do Not Intubate/DNI    Mobility Assessment (Last 72 Hours)     Mobility Assessment     Row Name 04/11/24 0326 04/10/24 0722 04/09/24 2300 04/08/24 2000     Does the patient have exclusion criteria? No - Perform mobility assessment No - Perform mobility assessment No - Perform mobility assessment Yes- Hold (Level 0) - Assessment complete    Mobility Assessment Exclusion Criteria -- No exclusion criteria present, perform mobility assessment -- No exclusion criteria present, perform mobility assessment    What is the highest level of mobility based on the mobility assessment? Level 1 (Bedfast) - Unable to balance while sitting on edge of bed Level 1 (Bedfast) - Unable to balance while sitting on edge of bed Level 1 (Bedfast) - Unable to balance while sitting on edge of bed Level 1 (Bedfast) - Unable to balance while sitting on edge of bed    Is the above level different from baseline mobility prior to current illness? -- Yes - Recommend PT order Yes - Recommend PT order Yes - Recommend PT order       Barriers to discharge: none Disposition Plan:  TBD HH orders placed: TBD Status is: Inpt  Objective: Blood pressure 119/61, pulse 100, temperature 99 F (37.2 C), temperature source Oral, resp. rate (!) 36, height 5' 2 (1.575 m), weight 93 kg, SpO2 100%.  Examination:  Physical Exam Constitutional:      Appearance: Normal appearance. She is obese. She is ill-appearing (chronic).  HENT:     Head: Normocephalic and atraumatic.     Mouth/Throat:     Mouth: Mucous membranes are moist.  Eyes:     Extraocular Movements: Extraocular movements intact.  Cardiovascular:     Rate and Rhythm: Normal rate and regular rhythm.  Pulmonary:     Effort: Pulmonary effort is normal. No respiratory distress.     Breath sounds: Normal breath sounds. No wheezing.  Abdominal:     General: Bowel sounds are normal. There is no distension.     Palpations: Abdomen is soft.     Tenderness: There is no abdominal tenderness.   Musculoskeletal:        General: Normal range of motion.     Cervical back: Normal range of motion and neck supple.  Skin:    General: Skin is warm and dry.  Neurological:     Mental Status: She is alert. Mental status is at baseline.  Psychiatric:        Mood and Affect: Mood normal.      Consultants:    Procedures:    Data Reviewed: Results for orders placed or performed during the hospital encounter of 03/27/24 (from the past 24 hours)  Glucose, capillary     Status: Abnormal   Collection Time: 04/10/24  4:00 PM  Result Value Ref Range   Glucose-Capillary 192 (H) 70 - 99 mg/dL  Glucose, capillary     Status: Abnormal   Collection Time: 04/10/24  8:39 PM  Result Value Ref Range   Glucose-Capillary 218 (H) 70 - 99 mg/dL  Glucose, capillary     Status: Abnormal   Collection Time: 04/11/24 12:53 AM  Result Value Ref Range   Glucose-Capillary 240 (H) 70 - 99 mg/dL  Glucose, capillary     Status: Abnormal   Collection Time: 04/11/24  4:34 AM  Result Value Ref  Range   Glucose-Capillary 260 (H) 70 - 99 mg/dL  Glucose, capillary     Status: Abnormal   Collection Time: 04/11/24  8:29 AM  Result Value Ref Range   Glucose-Capillary 216 (H) 70 - 99 mg/dL  Glucose, capillary     Status: Abnormal   Collection Time: 04/11/24 11:10 AM  Result Value Ref Range   Glucose-Capillary 124 (H) 70 - 99 mg/dL    I have reviewed pertinent nursing notes, vitals, labs, and images as necessary. I have ordered labwork to follow up on as indicated.  I have reviewed the last notes from staff over past 24 hours. I have discussed patient's care plan and test results with nursing staff, CM/SW, and other staff as appropriate.  Time spent: Greater than 50% of the 55 minute visit was spent in counseling/coordination of care for the patient as laid out in the A&P.   LOS: 14 days   Alm Apo, MD [facility] 04/11/2024, 3:32 PM

## 2024-04-11 NOTE — TOC Progression Note (Signed)
 Transition of Care [facility]) - Progression Note    Patient Details  Name: Toni [name] MRN: [id number] Date of Birth: July 29, 1932  [facility]) CM/SW Contact  Luise [name], [place] Phone Number: 04/11/2024, 12:18 PM  Clinical Narrative:   Patient currently has a cortrak. CSW will fax referrals for SNF once there are plans for cortrak removal.  Expected Discharge Plan: Skilled Nursing Facility Barriers to Discharge: Continued Medical Work up               Expected Discharge Plan and Services In-house Referral: Clinical Social Work     Living arrangements for the past 2 months: Single Family Home                                       Social Drivers of Health (SDOH) Interventions SDOH Screenings   Food Insecurity: Patient Unable To Answer (03/30/2024)  Housing: Patient Unable To Answer (03/30/2024)  Transportation Needs: Patient Unable To Answer (03/30/2024)  Utilities: Patient Unable To Answer (03/30/2024)  Social Connections: Patient Unable To Answer (03/30/2024)  Tobacco Use: Low Risk  (03/27/2024)    Readmission Risk Interventions     No data to display

## 2024-04-11 NOTE — Progress Notes (Signed)
 Calorie Count Note - Day 1   48 hour calorie count ordered.  Diet: Dysphagia 2, thin liquids  Supplements: Ensure Plus HP BID, Magic Cup TID   Estimated Nutritional Needs:  Kcal:  1550-1750 kcals Protein:  90-110 g Fluid:  >/=1.7 L  Breakfast: 0%  Lunch: 20% beef w/ gravy, 15% minced broccoli, 15% mashed potato, 20% pudding, 100% iced tea Dinner: No ticket saved  Supplements: 0%  Total intake: 74 kcal (5% of minimum estimated needs)  3 g protein (3% of minimum estimated needs)  Nutrition Dx: Moderate Malnutrition related to acute illness as evidenced by mild muscle depletion, energy intake < or equal to 50% for > or equal to 5 days.  Goal: Patient will meet greater than or equal to 90% of their needs  Intervention: Dysphagia 2 diet  Ensure Plus High Protein po BID, each supplement provides 350 kcal and 20 grams of protein.   Vital 1.5  NOC feed @ goal rate 70 ml/hr via Cortrak tube x 12 hrs/day from 1800-0600              At goal this provides 1260 kcal, 57 g protein, 642 ml fluid   Sodium 140, MD order for 125 ml flushes q-8, recommend 125 ml q-2 during NOC feed. Formula + flushes would equal 1392 ml   Continue to monitor electrolytes and replete per MD   48 hr calorie count to assess adequacy of PO intake     Toni Dula, MS, RD, LDN Clinical Dietitian  Please see AMiON for contact information.

## 2024-04-12 ENCOUNTER — Inpatient Hospital Stay (HOSPITAL_COMMUNITY)

## 2024-04-12 DIAGNOSIS — R6521 Severe sepsis with septic shock: Secondary | ICD-10-CM | POA: Diagnosis not present

## 2024-04-12 DIAGNOSIS — G9341 Metabolic encephalopathy: Secondary | ICD-10-CM | POA: Diagnosis not present

## 2024-04-12 DIAGNOSIS — A419 Sepsis, unspecified organism: Secondary | ICD-10-CM | POA: Diagnosis not present

## 2024-04-12 DIAGNOSIS — J9601 Acute respiratory failure with hypoxia: Secondary | ICD-10-CM

## 2024-04-12 DIAGNOSIS — Z7189 Other specified counseling: Secondary | ICD-10-CM | POA: Diagnosis not present

## 2024-04-12 LAB — COMPREHENSIVE METABOLIC PANEL WITH GFR
ALT: 15 U/L (ref 0–44)
AST: 12 U/L — ABNORMAL LOW (ref 15–41)
Albumin: 1.8 g/dL — ABNORMAL LOW (ref 3.5–5.0)
Alkaline Phosphatase: 58 U/L (ref 38–126)
Anion gap: 9 (ref 5–15)
BUN: 15 mg/dL (ref 8–23)
CO2: 24 mmol/L (ref 22–32)
Calcium: 8.8 mg/dL — ABNORMAL LOW (ref 8.9–10.3)
Chloride: 108 mmol/L (ref 98–111)
Creatinine, Ser: 0.45 mg/dL (ref 0.44–1.00)
GFR, Estimated: >60 mL/min (ref >60)
Glucose, Bld: 215 mg/dL — ABNORMAL HIGH (ref 70–99)
Potassium: 4.4 mmol/L (ref 3.5–5.1)
Sodium: 141 mmol/L (ref 135–145)
Total Bilirubin: 0.4 mg/dL (ref 0.0–1.2)
Total Protein: 5.9 g/dL — ABNORMAL LOW (ref 6.5–8.1)

## 2024-04-12 LAB — CBC WITH DIFFERENTIAL/PLATELET
Basophils Absolute: 0 K/uL (ref 0.0–0.1)
Basophils Relative: 0 %
Eosinophils Absolute: 0 K/uL (ref 0.0–0.5)
Eosinophils Relative: 0 %
HCT: 35.9 % — ABNORMAL LOW (ref 36.0–46.0)
Hemoglobin: 10.4 g/dL — ABNORMAL LOW (ref 12.0–15.0)
Lymphocytes Relative: 14 %
Lymphs Abs: 1.3 K/uL (ref 0.7–4.0)
MCH: 27.1 pg (ref 26.0–34.0)
MCHC: 29 g/dL — ABNORMAL LOW (ref 30.0–36.0)
MCV: 93.5 fL (ref 80.0–100.0)
Monocytes Absolute: 1.1 K/uL — ABNORMAL HIGH (ref 0.1–1.0)
Monocytes Relative: 11 %
Neutro Abs: 7.2 K/uL (ref 1.7–7.7)
Neutrophils Relative %: 75 %
Platelets: 344 K/uL (ref 150–400)
RBC: 3.84 MIL/uL — ABNORMAL LOW (ref 3.87–5.11)
RDW: 15.4 % (ref 11.5–15.5)
WBC Morphology: INCREASED
WBC: 9.6 K/uL (ref 4.0–10.5)
nRBC: 4.5 % — ABNORMAL HIGH (ref 0.0–0.2)

## 2024-04-12 LAB — GLUCOSE, CAPILLARY
Glucose-Capillary: 190 mg/dL — ABNORMAL HIGH (ref 70–99)
Glucose-Capillary: 202 mg/dL — ABNORMAL HIGH (ref 70–99)
Glucose-Capillary: 208 mg/dL — ABNORMAL HIGH (ref 70–99)
Glucose-Capillary: 210 mg/dL — ABNORMAL HIGH (ref 70–99)
Glucose-Capillary: 263 mg/dL — ABNORMAL HIGH (ref 70–99)
Glucose-Capillary: 274 mg/dL — ABNORMAL HIGH (ref 70–99)

## 2024-04-12 LAB — BLOOD GAS, ARTERIAL
Acid-base deficit: 1.1 mmol/L (ref 0.0–2.0)
Bicarbonate: 28.8 mmol/L — ABNORMAL HIGH (ref 20.0–28.0)
Drawn by: 441371
O2 Saturation: 97.3 %
Patient temperature: 36.9
pCO2 arterial: 79 mmHg (ref 32–48)
pH, Arterial: 7.17 — CL (ref 7.35–7.45)
pO2, Arterial: 116 mmHg — ABNORMAL HIGH (ref 83–108)

## 2024-04-12 LAB — AMMONIA: Ammonia: 117 umol/L — ABNORMAL HIGH (ref 9–35)

## 2024-04-12 LAB — PROCALCITONIN: Procalcitonin: 0.23 ng/mL

## 2024-04-12 MED ORDER — SODIUM CHLORIDE 0.9 % IV SOLN
3.0000 g | Freq: Four times a day (QID) | INTRAVENOUS | Status: DC
  Administered 2024-04-12 – 2024-04-17 (×19): 3 g via INTRAVENOUS
  Filled 2024-04-12 (×19): qty 8

## 2024-04-12 MED ORDER — LACTULOSE ENEMA
300.0000 mL | Freq: Two times a day (BID) | ORAL | Status: DC
  Administered 2024-04-12: 300 mL via RECTAL
  Filled 2024-04-12 (×4): qty 300

## 2024-04-12 MED ORDER — VITAL AF 1.2 CAL PO LIQD
1320.0000 mL | ORAL | Status: DC
  Administered 2024-04-12 – 2024-04-17 (×5): 1320 mL
  Filled 2024-04-12: qty 1422
  Filled 2024-04-12 (×4): qty 2000

## 2024-04-12 NOTE — Progress Notes (Signed)
 PT Cancellation Note  Patient Details Name: ILONA COLLEY MRN: [id number] DOB: [birth year]   Cancelled Treatment:    Reason Eval/Treat Not Completed: Patient's level of consciousness (Pt unarousable even to sternal rub, gasping breaths and not able to participate at this time. recommendation for LTC remains and will sign off)   Daymond Cordts B Jamieka Royle 04/12/2024, 12:13 PM Lenoard SQUIBB, PT Acute Rehabilitation Services Office: [phone]

## 2024-04-12 NOTE — Progress Notes (Signed)
 Nutrition Follow-up  DOCUMENTATION CODES:   Non-severe (moderate) malnutrition in context of acute illness/injury, Obesity unspecified  INTERVENTION:  -Dysphagia 2 diet  -Ensure Plus High Protein po BID, each supplement provides 350 kcal and 20 grams of protein.  -Change to Vital 1.2 continuous feeds @ goal rate 55 ml/hr via Cortrak tube x 24 hrs              At goal this provides 1584 kcal, 99 g protein, 1069 ml fluid   -125 ml flushes q-8 hrs   -Continue to monitor electrolytes and replete per MD  - Recommend Palliative care reach out to family for GOC discussion regarding long term nutrition support.     NUTRITION DIAGNOSIS:   Moderate Malnutrition related to acute illness as evidenced by mild muscle depletion, energy intake < or equal to 50% for > or equal to 5 days.  Ongoing  GOAL:   Patient will meet greater than or equal to 90% of their needs  Progressing   MONITOR:   Diet advancement, TF tolerance, Weight trends, Labs  REASON FOR ASSESSMENT:   Consult Enteral/tube feeding initiation and management, Assessment of nutrition requirement/status  ASSESSMENT:   89 yo female admitted with sepsis with UTI and colitis, +shock. Pt with increasing weakness and lethargy with development of N/V with constipation for about 1 week PTA; +AKI. PMH includes HFpEF, DM, HTN, obesity, cirrhosis  48 hour calorie count ordered. Minimal intake note on day 1, see RD note form 9/24. Spoke to RN, reports patient had dried vomit on gown this morning with increased respirations. Did not get in report overnight that tube feeds had to be paused. Patient has not had any PO intake today. Currently has nocturnal feeds of Vital 1.5 @ 70 ml/hr x 12 hrs ordered, cortrak in place. Secure chat sent to MD, dissucssed unlikley that patient will be able to transition off tube feeds and have cortrak removed. Recommended switching back to continuous feeds to lower goal rate and have Palliative reach out  to family for GOC discussion.    Labs Glu 243  Cr 0.37 Calcium 8.3   Medications  Chlorhexidine  Gluconate Cloth  6 each Topical Daily   feeding supplement  237 mL Oral BID BM   feeding supplement (VITAL 1.5 CAL)  840 mL Per Tube Daily   free water   125 mL Per Tube 6 X Daily   Gerhardt's butt cream   Topical Daily   heparin   5,000 Units Subcutaneous Q8H   insulin  aspart  0-15 Units Subcutaneous Q4H   insulin  glargine  10 Units Subcutaneous Daily   lactulose   10 g Per Tube BID   liver oil-zinc  oxide   Topical BID   metoCLOPramide  (REGLAN ) injection  10 mg Intravenous Q8H   midodrine   5 mg Per Tube Q8H   mouth rinse  15 mL Mouth Rinse 4 times per day   pantoprazole   40 mg Oral Daily   phosphorus  500 mg Per Tube BID     NUTRITION - FOCUSED PHYSICAL EXAM:  Flowsheet Row Most Recent Value  Orbital Region No depletion  Upper Arm Region No depletion  Thoracic and Lumbar Region Unable to assess  Buccal Region Mild depletion  Temple Region Mild depletion  Clavicle Bone Region Mild depletion  Clavicle and Acromion Bone Region Mild depletion  Scapular Bone Region Mild depletion  Dorsal Hand Moderate depletion  Patellar Region Moderate depletion  Anterior Thigh Region Moderate depletion  Posterior Calf Region Moderate depletion  Edema (RD  Assessment) Mild  Hair Reviewed  Eyes Reviewed  Mouth Unable to assess  [Pt indicated she could not open her mouth at this time]  Skin Other (Comment)  [dry flaky/scaly skin]  Nails Reviewed    Diet Order:   Diet Order             DIET DYS 2 Room service appropriate? No; Fluid consistency: Thin  Diet effective now                   EDUCATION NEEDS:   Education needs have been addressed  Skin:  Skin Assessment: Skin Integrity Issues: Skin Integrity Issues:: Stage I Stage I: Anus  Last BM:  9/25  Height:   Ht Readings from Last 1 Encounters:  03/28/24 5' 2 (1.575 m)    Weight:   Wt Readings from Last 1 Encounters:   04/10/24 93 kg    BMI:  Body mass index is 37.5 kg/m.  Estimated Nutritional Needs:   Kcal:  1550-1750 kcals  Protein:  90-110 g  Fluid:  >/=1.7 L  Caleb Decock, MS, RD, LDN Clinical Dietitian  Please see [facility] for contact information.

## 2024-04-12 NOTE — Inpatient Diabetes Management (Signed)
 Inpatient Diabetes Program Recommendations  AACE/ADA: New Consensus Statement on Inpatient Glycemic Control (2015)  Target Ranges:  Prepandial:   less than 140 mg/dL      Peak postprandial:   less than 180 mg/dL (1-2 hours)      Critically ill patients:  140 - 180 mg/dL   Lab Results  Component Value Date   GLUCAP 202 (H) 04/12/2024   HGBA1C 7.2 (H) 03/28/2024    Review of Glycemic Control  Latest Reference Range & Units 04/12/24 04:40 04/12/24 08:14 04/12/24 10:51  Glucose-Capillary 70 - 99 mg/dL 725 (H) 809 (H) 797 (H)   Diabetes history: DM 2 Outpatient Diabetes medications:  Metformin 500 mg bid Current orders for Inpatient glycemic control:  Novolog  0-15 units q 4 hours Vital 55 ml/hr Lantus  10 units daily  Inpatient Diabetes Program Recommendations:    If CBG's remain >goal while on tube feeds, consider adding Novolog  2 units q 4 hours to cover tube feeds.   Thanks,  Randall Bullocks, RN, BC-ADM Inpatient Diabetes Coordinator Pager [phone]  (8a-5p)

## 2024-04-12 NOTE — Progress Notes (Signed)
 Patient found to have dried vomit on chest, gown, bed and increased respirations this morning at shift change. MD notified.

## 2024-04-12 NOTE — Progress Notes (Signed)
 Progress Note    Toni [name]   FMW:[id number]  DOB: 10-Jan-1933  DOA: 03/27/2024     15 PCP: Patient, No Pcp Per  Initial CC: weakness, lethargy, N/V  Hospital Course: 89 y.o. female with past medical history of chronic HFpEF, diabetes type 2, peripheral artery disease, cirrhosis, and HTN who presented to the ED on 03/27/2024 with nausea/vomiting, lethargy, and weakness. She is typically alert but was progressively confused over the course of the day as well.  All history is obtained from chart 2/2 pt's acute encephalopathy.  She is admitted with sepsis secondary to UTI and colitis, AKI, lactic acidosis, metabolic acidosis, acute metabolic encephalopathy, and new onset A-fib. Pt reportedly has frequent uti's in past. UA pending. Lactate elevated on presentation but improving to 3 with volume. Ultimately, she required initiation of norepi to maintain BP. Found to be in aki with Cr 1.5 (baseline 0.7) with some hydronephrosis 2/2 R staghorn stone. There is also stigmata of espophagitis and colitis.   Assessment and Plan:  GOC ?FTT Acute metabolic encephalopathy  - worsened in general 9/25; shallow breaths and much more fatigued appearing along with severely worsened mentation; not following commands. Found to have vomit on her chest this am too - checking ABG, NH3, CMP, CBC - CXR already repeated this morning with no worsening compared to prior or obvious signs of aspiration yet - ABG returned showing significant hypercarbia (7.17/79/116).  BiPAP ordered - Reengaging palliative care as she also has not done well on calorie counts over the past 48 hours and not maintaining adequate nutrition.  Cortrak has been in place for tube feeds.  Would not recommend a PEG tube but needing more GOC discussions as patient certainly not thriving or improving a significant amount  Sepsis 2/2 UTI- likely pyelonephritis and colitis; shock resolved E. Coli & Proteus mirabilis UTI -E. coli resistant to  ampicillin  and Proteus resistant to Macrodantin, has been de-escalated to ceftriaxone  based on susceptibilities -Continue midodrine  5 mg 3 times daily, wean off as tolerated. BP remains stable - Was found to have staghorn calculus in the right kidney without any obstructive processes although did have mild right hydronephrosis and dilatation of the proximal right ureter on ultrasound and CT imaging   Chronic HFpEF  -ECHO  EF 70-75%, hyperdynamic, severe MR calcifications with associated murmur -Currently no acute issues, follow closely for any volume overload - Abdominal x-ray from 04/10/2024 shows gaseous distention of bowel and no visible free air   Acute hypokalemia - Replete as needed   Atrial fibrillation, new-onset -Now in NSR, likely due to septic shock  - CHA2DS2-VASc 2: 6-if A-fib returns, will need GOC regarding anticoagulation   Cirrhosis/Hyperammonemia -Noted on CT abd/pelvis -Continue lactulose    Normocytic anemia H&H stable   Constipation -Found to have large stool ball in the rectum with mild rectal wall thickening, stercoral colitis -Now having BM   Esophagitis, chronic  CT revealed marked wall thickening of the distal esophagus, esophagitis.  Infiltrative mass has not been excluded - No GI bleed.  Palliative medicine following for GOC - Per palliative medicine, family wishes to continue supportive care while hoping for improvement. Given advanced age and multiple medical problems not a candidate for aggressive treatment otherwise -Continue PPI   Acute kidney injury with mild right hydronephrosis, improving  -Seen by urology, Dr. Renda on 9/10, recommended outpatient follow-up once patient is optimized for consideration of PCNL.   - Cr now normalized   Diabetes mellitus type 2 Continue moderate SSI  every 4 hours,  cont insulin  Lantus  5 units daily -Glycemic trends stable   Moderate protein energy malnutrition -Continue tube feeds, SLP 9/15-> full liquid  diet -Speech therapy following, advanced to dys 3 diet -Dietitian following. Goal is to eventually wean off TF and remove coretrak   Obesity class II Estimated body mass index is 38.39 kg/m as calculated from the following:   Height as of this encounter: 5' 2 (1.575 m).   Weight as of this encounter: 95.2 kg  Interval History:  Patient looks significantly worse this morning.  Worsening mentation with persistent shallow breathing since yesterday now.  Eyes open and tracking some but not following commands.  Does not withdrawal to painful stimuli either. Commencing further workup for worsening mentation this morning.  No family present.  Old records reviewed in assessment of this patient  Antimicrobials:   DVT prophylaxis:  heparin  injection 5,000 Units Start: 03/28/24 0600 SCDs Start: 03/28/24 0230   Code Status:   Code Status: Limited: Do not attempt resuscitation (DNR) -DNR-LIMITED -Do Not Intubate/DNI   Mobility Assessment (Last 72 Hours)     Mobility Assessment     Row Name 04/12/24 [phone] 04/11/24 0326 04/10/24 0722 04/09/24 2300     Does the patient have exclusion criteria? No - Perform mobility assessment No - Perform mobility assessment No - Perform mobility assessment No - Perform mobility assessment    Mobility Assessment Exclusion Criteria -- -- No exclusion criteria present, perform mobility assessment --    What is the highest level of mobility based on the mobility assessment? Level 1 (Bedfast) - Unable to balance while sitting on edge of bed Level 1 (Bedfast) - Unable to balance while sitting on edge of bed Level 1 (Bedfast) - Unable to balance while sitting on edge of bed Level 1 (Bedfast) - Unable to balance while sitting on edge of bed    Is the above level different from baseline mobility prior to current illness? -- -- Yes - Recommend PT order Yes - Recommend PT order       Barriers to discharge: none Disposition Plan:  TBD HH orders placed: TBD Status is:  Inpt  Objective: Blood pressure 108/65, pulse (!) 110, temperature 98.5 F (36.9 C), temperature source Axillary, resp. rate (!) 36, height 5' 2 (1.575 m), weight 93 kg, SpO2 100%.  Examination:  Physical Exam Constitutional:      Appearance: She is ill-appearing (chronic).     Comments: Much more lethargic with worsening mentation and not following commands or talking  HENT:     Head: Normocephalic and atraumatic.     Mouth/Throat:     Mouth: Mucous membranes are moist.  Eyes:     Extraocular Movements: Extraocular movements intact.  Cardiovascular:     Rate and Rhythm: Regular rhythm. Tachycardia present.  Pulmonary:     Effort: Pulmonary effort is normal. Tachypnea present. No respiratory distress.     Breath sounds: No wheezing.     Comments: Shallow breaths persist Abdominal:     General: Bowel sounds are normal. There is no distension.     Palpations: Abdomen is soft.     Tenderness: There is no abdominal tenderness.     Comments: PEG in place  Musculoskeletal:        General: Normal range of motion.     Cervical back: Normal range of motion and neck supple.  Skin:    General: Skin is warm and dry.  Neurological:     Comments: Does not  withdrawal to painful stimuli and not following commands      Consultants:    Procedures:    Data Reviewed: Results for orders placed or performed during the hospital encounter of 03/27/24 (from the past 24 hours)  Glucose, capillary     Status: Abnormal   Collection Time: 04/11/24  4:13 PM  Result Value Ref Range   Glucose-Capillary 136 (H) 70 - 99 mg/dL  Glucose, capillary     Status: Abnormal   Collection Time: 04/11/24  9:17 PM  Result Value Ref Range   Glucose-Capillary 228 (H) 70 - 99 mg/dL  Glucose, capillary     Status: Abnormal   Collection Time: 04/12/24 12:53 AM  Result Value Ref Range   Glucose-Capillary 263 (H) 70 - 99 mg/dL  Glucose, capillary     Status: Abnormal   Collection Time: 04/12/24  4:40 AM   Result Value Ref Range   Glucose-Capillary 274 (H) 70 - 99 mg/dL  Glucose, capillary     Status: Abnormal   Collection Time: 04/12/24  8:14 AM  Result Value Ref Range   Glucose-Capillary 190 (H) 70 - 99 mg/dL  Glucose, capillary     Status: Abnormal   Collection Time: 04/12/24 10:51 AM  Result Value Ref Range   Glucose-Capillary 202 (H) 70 - 99 mg/dL  Blood gas, arterial     Status: Abnormal   Collection Time: 04/12/24 12:09 PM  Result Value Ref Range   pH, Arterial 7.17 (LL) 7.35 - 7.45   pCO2 arterial 79 (HH) 32 - 48 mmHg   pO2, Arterial 116 (H) 83 - 108 mmHg   Bicarbonate 28.8 (H) 20.0 - 28.0 mmol/L   Acid-base deficit 1.1 0.0 - 2.0 mmol/L   O2 Saturation 97.3 %   Patient temperature 36.9    Collection site RIGHT RADIAL    Drawn by [id number]    Allens test (pass/fail) PASS PASS    I have reviewed pertinent nursing notes, vitals, labs, and images as necessary. I have ordered labwork to follow up on as indicated.  I have reviewed the last notes from staff over past 24 hours. I have discussed patient's care plan and test results with nursing staff, CM/SW, and other staff as appropriate.  Time spent: Greater than 50% of the 55 minute visit was spent in counseling/coordination of care for the patient as laid out in the A&P.   LOS: 15 days   Alm Apo, MD [facility] 04/12/2024, 1:19 PM

## 2024-04-12 NOTE — Progress Notes (Deleted)
  Patient Saturations on Room Air at Rest = 96%  Patient Saturations on Room Air while Ambulating = 94%   

## 2024-04-12 NOTE — Progress Notes (Signed)
 Critical ABG values given and read back to V. Maryann, RN at 12:29.

## 2024-04-12 NOTE — Progress Notes (Signed)
 SLP Cancellation Note  Patient Details Name: Toni [name] MRN: [id number] DOB: [birth year]   Cancelled treatment:       Reason Eval/Treat Not Completed: Patient's level of consciousness. Per PT, pt unarousable to sternal rub, gasping breaths, and not currently appropriate for SLP interventions. Will f/u at least briefly to address any acute needs.    Damien Blumenthal, M.A., CCC-SLP Speech Language Pathology, Acute Rehabilitation Services  Secure Chat preferred [phone]  04/12/2024, 12:37 PM

## 2024-04-12 NOTE — Plan of Care (Signed)
 Problem: Education: Goal: Ability to describe self-care measures that may prevent or decrease complications (Diabetes Survival Skills Education) will improve 04/12/2024 0602 by Ainsley Motto, RN Outcome: Progressing 04/12/2024 0602 by Ainsley Motto, RN Outcome: Progressing 04/11/2024 1943 by Ainsley Motto, RN Outcome: Progressing Goal: Individualized Educational Video(s) 04/12/2024 0602 by Ainsley Motto, RN Outcome: Progressing 04/12/2024 0602 by Ainsley Motto, RN Outcome: Progressing 04/11/2024 1943 by Ainsley Motto, RN Outcome: Progressing   Problem: Coping: Goal: Ability to adjust to condition or change in health will improve 04/12/2024 0602 by Ainsley Motto, RN Outcome: Progressing 04/12/2024 0602 by Ainsley Motto, RN Outcome: Progressing 04/11/2024 1943 by Ainsley Motto, RN Outcome: Progressing   Problem: Fluid Volume: Goal: Ability to maintain a balanced intake and output will improve 04/12/2024 0602 by Ainsley Motto, RN Outcome: Progressing 04/12/2024 0602 by Ainsley Motto, RN Outcome: Progressing 04/11/2024 1943 by Ainsley Motto, RN Outcome: Progressing   Problem: Health Behavior/Discharge Planning: Goal: Ability to identify and utilize available resources and services will improve 04/12/2024 0602 by Ainsley Motto, RN Outcome: Progressing 04/12/2024 0602 by Ainsley Motto, RN Outcome: Progressing 04/11/2024 1943 by Ainsley Motto, RN Outcome: Progressing Goal: Ability to manage health-related needs will improve 04/12/2024 0602 by Ainsley Motto, RN Outcome: Progressing 04/12/2024 0602 by Ainsley Motto, RN Outcome: Progressing 04/11/2024 1943 by Ainsley Motto, RN Outcome: Progressing   Problem: Metabolic: Goal: Ability to maintain appropriate glucose levels will improve 04/12/2024 0602 by Ainsley Motto, RN Outcome: Progressing 04/12/2024 0602 by Ainsley Motto, RN Outcome:  Progressing 04/11/2024 1943 by Ainsley Motto, RN Outcome: Progressing   Problem: Nutritional: Goal: Maintenance of adequate nutrition will improve 04/12/2024 0602 by Ainsley Motto, RN Outcome: Progressing 04/12/2024 0602 by Ainsley Motto, RN Outcome: Progressing 04/11/2024 1943 by Ainsley Motto, RN Outcome: Progressing Goal: Progress toward achieving an optimal weight will improve 04/12/2024 0602 by Ainsley Motto, RN Outcome: Progressing 04/12/2024 0602 by Ainsley Motto, RN Outcome: Progressing 04/11/2024 1943 by Ainsley Motto, RN Outcome: Progressing   Problem: Skin Integrity: Goal: Risk for impaired skin integrity will decrease 04/12/2024 0602 by Ainsley Motto, RN Outcome: Progressing 04/12/2024 0602 by Ainsley Motto, RN Outcome: Progressing 04/11/2024 1943 by Ainsley Motto, RN Outcome: Progressing   Problem: Tissue Perfusion: Goal: Adequacy of tissue perfusion will improve 04/12/2024 0602 by Ainsley Motto, RN Outcome: Progressing 04/12/2024 0602 by Ainsley Motto, RN Outcome: Progressing 04/11/2024 1943 by Ainsley Motto, RN Outcome: Progressing   Problem: Education: Goal: Knowledge of General Education information will improve Description: Including pain rating scale, medication(s)/side effects and non-pharmacologic comfort measures 04/12/2024 0602 by Ainsley Motto, RN Outcome: Progressing 04/12/2024 0602 by Ainsley Motto, RN Outcome: Progressing 04/11/2024 1943 by Ainsley Motto, RN Outcome: Progressing   Problem: Health Behavior/Discharge Planning: Goal: Ability to manage health-related needs will improve 04/12/2024 0602 by Ainsley Motto, RN Outcome: Progressing 04/12/2024 0602 by Ainsley Motto, RN Outcome: Progressing 04/11/2024 1943 by Ainsley Motto, RN Outcome: Progressing   Problem: Clinical Measurements: Goal: Ability to maintain clinical measurements within normal limits will  improve 04/12/2024 0602 by Ainsley Motto, RN Outcome: Progressing 04/12/2024 0602 by Ainsley Motto, RN Outcome: Progressing 04/11/2024 1943 by Ainsley Motto, RN Outcome: Progressing Goal: Will remain free from infection 04/12/2024 0602 by Ainsley Motto, RN Outcome: Progressing 04/12/2024 0602 by Ainsley Motto, RN Outcome: Progressing 04/11/2024 1943 by Ainsley Motto, RN Outcome: Progressing Goal: Diagnostic test results will improve 04/12/2024 0602 by Ainsley Motto, RN Outcome: Progressing 04/12/2024 0602 by Ainsley Motto, RN Outcome: Progressing 04/11/2024 1943 by Ainsley Motto, RN Outcome: Progressing Goal: Respiratory complications will improve 04/12/2024 0602 by Ainsley,  Chalmers, RN Outcome: Progressing 04/12/2024 0602 by Ainsley Chalmers, RN Outcome: Progressing 04/11/2024 1943 by Ainsley Chalmers, RN Outcome: Progressing Goal: Cardiovascular complication will be avoided 04/12/2024 0602 by Ainsley Chalmers, RN Outcome: Progressing 04/12/2024 0602 by Ainsley Chalmers, RN Outcome: Progressing 04/11/2024 1943 by Ainsley Chalmers, RN Outcome: Progressing   Problem: Activity: Goal: Risk for activity intolerance will decrease 04/12/2024 0602 by Ainsley Chalmers, RN Outcome: Progressing 04/12/2024 0602 by Ainsley Chalmers, RN Outcome: Progressing 04/11/2024 1943 by Ainsley Chalmers, RN Outcome: Progressing   Problem: Nutrition: Goal: Adequate nutrition will be maintained 04/12/2024 0602 by Ainsley Chalmers, RN Outcome: Progressing 04/12/2024 0602 by Ainsley Chalmers, RN Outcome: Progressing 04/11/2024 1943 by Ainsley Chalmers, RN Outcome: Progressing   Problem: Coping: Goal: Level of anxiety will decrease 04/12/2024 0602 by Ainsley Chalmers, RN Outcome: Progressing 04/12/2024 0602 by Ainsley Chalmers, RN Outcome: Progressing 04/11/2024 1943 by Ainsley Chalmers, RN Outcome: Progressing   Problem:  Elimination: Goal: Will not experience complications related to bowel motility 04/12/2024 0602 by Ainsley Chalmers, RN Outcome: Progressing 04/12/2024 0602 by Ainsley Chalmers, RN Outcome: Progressing 04/11/2024 1943 by Ainsley Chalmers, RN Outcome: Progressing Goal: Will not experience complications related to urinary retention 04/12/2024 0602 by Ainsley Chalmers, RN Outcome: Progressing 04/12/2024 0602 by Ainsley Chalmers, RN Outcome: Progressing 04/11/2024 1943 by Ainsley Chalmers, RN Outcome: Progressing   Problem: Pain Managment: Goal: General experience of comfort will improve and/or be controlled 04/12/2024 0602 by Ainsley Chalmers, RN Outcome: Progressing 04/12/2024 0602 by Ainsley Chalmers, RN Outcome: Progressing 04/11/2024 1943 by Ainsley Chalmers, RN Outcome: Progressing   Problem: Safety: Goal: Ability to remain free from injury will improve 04/12/2024 0602 by Ainsley Chalmers, RN Outcome: Progressing 04/12/2024 0602 by Ainsley Chalmers, RN Outcome: Progressing 04/11/2024 1943 by Ainsley Chalmers, RN Outcome: Progressing   Problem: Skin Integrity: Goal: Risk for impaired skin integrity will decrease 04/12/2024 0602 by Ainsley Chalmers, RN Outcome: Progressing 04/12/2024 0602 by Ainsley Chalmers, RN Outcome: Progressing 04/11/2024 1943 by Ainsley Chalmers, RN Outcome: Progressing

## 2024-04-13 DIAGNOSIS — Z515 Encounter for palliative care: Secondary | ICD-10-CM | POA: Diagnosis not present

## 2024-04-13 DIAGNOSIS — Z7189 Other specified counseling: Secondary | ICD-10-CM | POA: Diagnosis not present

## 2024-04-13 DIAGNOSIS — G9341 Metabolic encephalopathy: Secondary | ICD-10-CM | POA: Diagnosis not present

## 2024-04-13 DIAGNOSIS — A419 Sepsis, unspecified organism: Secondary | ICD-10-CM | POA: Diagnosis not present

## 2024-04-13 DIAGNOSIS — J9601 Acute respiratory failure with hypoxia: Secondary | ICD-10-CM | POA: Diagnosis not present

## 2024-04-13 LAB — BLOOD GAS, VENOUS
Acid-base deficit: 1.9 mmol/L (ref 0.0–2.0)
Bicarbonate: 27.6 mmol/L (ref 20.0–28.0)
O2 Saturation: 46.8 %
Patient temperature: 37
pCO2, Ven: 69 mmHg — ABNORMAL HIGH (ref 44–60)
pH, Ven: 7.21 — ABNORMAL LOW (ref 7.25–7.43)
pO2, Ven: <31 mmHg — CL (ref 32–45)

## 2024-04-13 LAB — CBC WITH DIFFERENTIAL/PLATELET
Basophils Absolute: 0 K/uL (ref 0.0–0.1)
Basophils Relative: 0 %
Eosinophils Absolute: 0 K/uL (ref 0.0–0.5)
Eosinophils Relative: 0 %
HCT: 34.8 % — ABNORMAL LOW (ref 36.0–46.0)
Hemoglobin: 10.1 g/dL — ABNORMAL LOW (ref 12.0–15.0)
Lymphocytes Relative: 19 %
Lymphs Abs: 2.2 K/uL (ref 0.7–4.0)
MCH: 27.5 pg (ref 26.0–34.0)
MCHC: 29 g/dL — ABNORMAL LOW (ref 30.0–36.0)
MCV: 94.8 fL (ref 80.0–100.0)
Monocytes Absolute: 1.8 K/uL — ABNORMAL HIGH (ref 0.1–1.0)
Monocytes Relative: 16 %
Neutro Abs: 7.5 K/uL (ref 1.7–7.7)
Neutrophils Relative %: 65 %
Platelets: 383 K/uL (ref 150–400)
RBC: 3.67 MIL/uL — ABNORMAL LOW (ref 3.87–5.11)
RDW: 15.3 % (ref 11.5–15.5)
WBC: 11.5 K/uL — ABNORMAL HIGH (ref 4.0–10.5)
nRBC: 3.3 % — ABNORMAL HIGH (ref 0.0–0.2)

## 2024-04-13 LAB — BASIC METABOLIC PANEL WITH GFR
Anion gap: 9 (ref 5–15)
BUN: 26 mg/dL — ABNORMAL HIGH (ref 8–23)
CO2: 23 mmol/L (ref 22–32)
Calcium: 8.7 mg/dL — ABNORMAL LOW (ref 8.9–10.3)
Chloride: 109 mmol/L (ref 98–111)
Creatinine, Ser: 0.5 mg/dL (ref 0.44–1.00)
GFR, Estimated: >60 mL/min (ref >60)
Glucose, Bld: 232 mg/dL — ABNORMAL HIGH (ref 70–99)
Potassium: 3.9 mmol/L (ref 3.5–5.1)
Sodium: 141 mmol/L (ref 135–145)

## 2024-04-13 LAB — GLUCOSE, CAPILLARY
Glucose-Capillary: 180 mg/dL — ABNORMAL HIGH (ref 70–99)
Glucose-Capillary: 195 mg/dL — ABNORMAL HIGH (ref 70–99)
Glucose-Capillary: 208 mg/dL — ABNORMAL HIGH (ref 70–99)
Glucose-Capillary: 212 mg/dL — ABNORMAL HIGH (ref 70–99)
Glucose-Capillary: 214 mg/dL — ABNORMAL HIGH (ref 70–99)
Glucose-Capillary: 259 mg/dL — ABNORMAL HIGH (ref 70–99)
Glucose-Capillary: 264 mg/dL — ABNORMAL HIGH (ref 70–99)

## 2024-04-13 LAB — AMMONIA: Ammonia: 97 umol/L — ABNORMAL HIGH (ref 9–35)

## 2024-04-13 LAB — MAGNESIUM: Magnesium: 2.1 mg/dL (ref 1.7–2.4)

## 2024-04-13 MED ORDER — LACTULOSE 10 GM/15ML PO SOLN
30.0000 g | Freq: Three times a day (TID) | ORAL | Status: DC
Start: 2024-04-13 — End: 2024-04-14
  Administered 2024-04-13 (×2): 30 g
  Filled 2024-04-13 (×2): qty 45

## 2024-04-13 MED ORDER — PANTOPRAZOLE SODIUM 40 MG IV SOLR
40.0000 mg | Freq: Every day | INTRAVENOUS | Status: DC
  Administered 2024-04-14 – 2024-04-16 (×3): 40 mg via INTRAVENOUS
  Filled 2024-04-13 (×3): qty 10

## 2024-04-13 NOTE — Plan of Care (Signed)
 Problem: Education: Goal: Ability to describe self-care measures that may prevent or decrease complications (Diabetes Survival Skills Education) will improve Outcome: Not Progressing Goal: Individualized Educational Video(s) Outcome: Not Progressing   Problem: Coping: Goal: Ability to adjust to condition or change in health will improve Outcome: Not Progressing   Problem: Fluid Volume: Goal: Ability to maintain a balanced intake and output will improve Outcome: Not Progressing   Problem: Health Behavior/Discharge Planning: Goal: Ability to identify and utilize available resources and services will improve Outcome: Not Progressing Goal: Ability to manage health-related needs will improve Outcome: Not Progressing   Problem: Metabolic: Goal: Ability to maintain appropriate glucose levels will improve Outcome: Not Progressing   Problem: Nutritional: Goal: Maintenance of adequate nutrition will improve Outcome: Not Progressing Goal: Progress toward achieving an optimal weight will improve Outcome: Not Progressing   Problem: Skin Integrity: Goal: Risk for impaired skin integrity will decrease Outcome: Not Progressing   Problem: Tissue Perfusion: Goal: Adequacy of tissue perfusion will improve Outcome: Not Progressing   Problem: Education: Goal: Knowledge of General Education information will improve Description: Including pain rating scale, medication(s)/side effects and non-pharmacologic comfort measures Outcome: Not Progressing   Problem: Health Behavior/Discharge Planning: Goal: Ability to manage health-related needs will improve Outcome: Not Progressing   Problem: Clinical Measurements: Goal: Ability to maintain clinical measurements within normal limits will improve Outcome: Not Progressing Goal: Will remain free from infection Outcome: Not Progressing Goal: Diagnostic test results will improve Outcome: Not Progressing Goal: Respiratory complications will  improve Outcome: Not Progressing Goal: Cardiovascular complication will be avoided Outcome: Not Progressing   Problem: Activity: Goal: Risk for activity intolerance will decrease Outcome: Not Progressing   Problem: Nutrition: Goal: Adequate nutrition will be maintained Outcome: Not Progressing   Problem: Coping: Goal: Level of anxiety will decrease Outcome: Not Progressing   Problem: Elimination: Goal: Will not experience complications related to bowel motility Outcome: Not Progressing Goal: Will not experience complications related to urinary retention Outcome: Not Progressing   Problem: Pain Managment: Goal: General experience of comfort will improve and/or be controlled Outcome: Not Progressing   Problem: Safety: Goal: Ability to remain free from injury will improve Outcome: Not Progressing   Problem: Skin Integrity: Goal: Risk for impaired skin integrity will decrease Outcome: Not Progressing   Problem: Education: Goal: Ability to describe self-care measures that may prevent or decrease complications (Diabetes Survival Skills Education) will improve Outcome: Not Progressing Goal: Individualized Educational Video(s) Outcome: Not Progressing   Problem: Coping: Goal: Ability to adjust to condition or change in health will improve Outcome: Not Progressing   Problem: Fluid Volume: Goal: Ability to maintain a balanced intake and output will improve Outcome: Not Progressing   Problem: Health Behavior/Discharge Planning: Goal: Ability to identify and utilize available resources and services will improve Outcome: Not Progressing Goal: Ability to manage health-related needs will improve Outcome: Not Progressing   Problem: Metabolic: Goal: Ability to maintain appropriate glucose levels will improve Outcome: Not Progressing   Problem: Nutritional: Goal: Maintenance of adequate nutrition will improve Outcome: Not Progressing Goal: Progress toward achieving an  optimal weight will improve Outcome: Not Progressing   Problem: Skin Integrity: Goal: Risk for impaired skin integrity will decrease Outcome: Not Progressing   Problem: Tissue Perfusion: Goal: Adequacy of tissue perfusion will improve Outcome: Not Progressing   Problem: Education: Goal: Knowledge of General Education information will improve Description: Including pain rating scale, medication(s)/side effects and non-pharmacologic comfort measures Outcome: Not Progressing   Problem: Health Behavior/Discharge Planning: Goal: Ability to  manage health-related needs will improve Outcome: Not Progressing   Problem: Clinical Measurements: Goal: Ability to maintain clinical measurements within normal limits will improve Outcome: Not Progressing Goal: Will remain free from infection Outcome: Not Progressing Goal: Diagnostic test results will improve Outcome: Not Progressing Goal: Respiratory complications will improve Outcome: Not Progressing Goal: Cardiovascular complication will be avoided Outcome: Not Progressing   Problem: Activity: Goal: Risk for activity intolerance will decrease Outcome: Not Progressing   Problem: Nutrition: Goal: Adequate nutrition will be maintained Outcome: Not Progressing   Problem: Coping: Goal: Level of anxiety will decrease Outcome: Not Progressing   Problem: Elimination: Goal: Will not experience complications related to bowel motility Outcome: Not Progressing Goal: Will not experience complications related to urinary retention Outcome: Not Progressing   Problem: Pain Managment: Goal: General experience of comfort will improve and/or be controlled Outcome: Not Progressing   Problem: Safety: Goal: Ability to remain free from injury will improve Outcome: Not Progressing   Problem: Skin Integrity: Goal: Risk for impaired skin integrity will decrease Outcome: Not Progressing   Problem: Education: Goal: Ability to describe self-care  measures that may prevent or decrease complications (Diabetes Survival Skills Education) will improve Outcome: Not Progressing Goal: Individualized Educational Video(s) Outcome: Not Progressing   Problem: Coping: Goal: Ability to adjust to condition or change in health will improve Outcome: Not Progressing   Problem: Fluid Volume: Goal: Ability to maintain a balanced intake and output will improve Outcome: Not Progressing   Problem: Health Behavior/Discharge Planning: Goal: Ability to identify and utilize available resources and services will improve Outcome: Not Progressing Goal: Ability to manage health-related needs will improve Outcome: Not Progressing   Problem: Metabolic: Goal: Ability to maintain appropriate glucose levels will improve Outcome: Not Progressing   Problem: Nutritional: Goal: Maintenance of adequate nutrition will improve Outcome: Not Progressing Goal: Progress toward achieving an optimal weight will improve Outcome: Not Progressing   Problem: Skin Integrity: Goal: Risk for impaired skin integrity will decrease Outcome: Not Progressing   Problem: Tissue Perfusion: Goal: Adequacy of tissue perfusion will improve Outcome: Not Progressing   Problem: Education: Goal: Knowledge of General Education information will improve Description: Including pain rating scale, medication(s)/side effects and non-pharmacologic comfort measures Outcome: Not Progressing   Problem: Health Behavior/Discharge Planning: Goal: Ability to manage health-related needs will improve Outcome: Not Progressing   Problem: Clinical Measurements: Goal: Ability to maintain clinical measurements within normal limits will improve Outcome: Not Progressing Goal: Will remain free from infection Outcome: Not Progressing Goal: Diagnostic test results will improve Outcome: Not Progressing Goal: Respiratory complications will improve Outcome: Not Progressing Goal: Cardiovascular  complication will be avoided Outcome: Not Progressing   Problem: Activity: Goal: Risk for activity intolerance will decrease Outcome: Not Progressing   Problem: Nutrition: Goal: Adequate nutrition will be maintained Outcome: Not Progressing   Problem: Coping: Goal: Level of anxiety will decrease Outcome: Not Progressing   Problem: Elimination: Goal: Will not experience complications related to bowel motility Outcome: Not Progressing Goal: Will not experience complications related to urinary retention Outcome: Not Progressing   Problem: Pain Managment: Goal: General experience of comfort will improve and/or be controlled Outcome: Not Progressing   Problem: Safety: Goal: Ability to remain free from injury will improve Outcome: Not Progressing   Problem: Skin Integrity: Goal: Risk for impaired skin integrity will decrease Outcome: Not Progressing   Problem: Education: Goal: Ability to describe self-care measures that may prevent or decrease complications (Diabetes Survival Skills Education) will improve Outcome: Not Progressing Goal: Individualized Educational  Video(s) Outcome: Not Progressing   Problem: Coping: Goal: Ability to adjust to condition or change in health will improve Outcome: Not Progressing   Problem: Fluid Volume: Goal: Ability to maintain a balanced intake and output will improve Outcome: Not Progressing   Problem: Health Behavior/Discharge Planning: Goal: Ability to identify and utilize available resources and services will improve Outcome: Not Progressing Goal: Ability to manage health-related needs will improve Outcome: Not Progressing   Problem: Metabolic: Goal: Ability to maintain appropriate glucose levels will improve Outcome: Not Progressing   Problem: Nutritional: Goal: Maintenance of adequate nutrition will improve Outcome: Not Progressing Goal: Progress toward achieving an optimal weight will improve Outcome: Not Progressing    Problem: Skin Integrity: Goal: Risk for impaired skin integrity will decrease Outcome: Not Progressing   Problem: Tissue Perfusion: Goal: Adequacy of tissue perfusion will improve Outcome: Not Progressing   Problem: Education: Goal: Knowledge of General Education information will improve Description: Including pain rating scale, medication(s)/side effects and non-pharmacologic comfort measures Outcome: Not Progressing   Problem: Health Behavior/Discharge Planning: Goal: Ability to manage health-related needs will improve Outcome: Not Progressing   Problem: Clinical Measurements: Goal: Ability to maintain clinical measurements within normal limits will improve Outcome: Not Progressing Goal: Will remain free from infection Outcome: Not Progressing Goal: Diagnostic test results will improve Outcome: Not Progressing Goal: Respiratory complications will improve Outcome: Not Progressing Goal: Cardiovascular complication will be avoided Outcome: Not Progressing   Problem: Activity: Goal: Risk for activity intolerance will decrease Outcome: Not Progressing   Problem: Nutrition: Goal: Adequate nutrition will be maintained Outcome: Not Progressing   Problem: Coping: Goal: Level of anxiety will decrease Outcome: Not Progressing   Problem: Elimination: Goal: Will not experience complications related to bowel motility Outcome: Not Progressing Goal: Will not experience complications related to urinary retention Outcome: Not Progressing   Problem: Pain Managment: Goal: General experience of comfort will improve and/or be controlled Outcome: Not Progressing   Problem: Safety: Goal: Ability to remain free from injury will improve Outcome: Not Progressing   Problem: Skin Integrity: Goal: Risk for impaired skin integrity will decrease Outcome: Not Progressing   Problem: Education: Goal: Ability to describe self-care measures that may prevent or decrease complications  (Diabetes Survival Skills Education) will improve Outcome: Not Progressing Goal: Individualized Educational Video(s) Outcome: Not Progressing   Problem: Coping: Goal: Ability to adjust to condition or change in health will improve Outcome: Not Progressing   Problem: Fluid Volume: Goal: Ability to maintain a balanced intake and output will improve Outcome: Not Progressing   Problem: Health Behavior/Discharge Planning: Goal: Ability to identify and utilize available resources and services will improve Outcome: Not Progressing Goal: Ability to manage health-related needs will improve Outcome: Not Progressing   Problem: Metabolic: Goal: Ability to maintain appropriate glucose levels will improve Outcome: Not Progressing   Problem: Nutritional: Goal: Maintenance of adequate nutrition will improve Outcome: Not Progressing Goal: Progress toward achieving an optimal weight will improve Outcome: Not Progressing   Problem: Skin Integrity: Goal: Risk for impaired skin integrity will decrease Outcome: Not Progressing   Problem: Tissue Perfusion: Goal: Adequacy of tissue perfusion will improve Outcome: Not Progressing   Problem: Education: Goal: Knowledge of General Education information will improve Description: Including pain rating scale, medication(s)/side effects and non-pharmacologic comfort measures Outcome: Not Progressing   Problem: Health Behavior/Discharge Planning: Goal: Ability to manage health-related needs will improve Outcome: Not Progressing   Problem: Clinical Measurements: Goal: Ability to maintain clinical measurements within normal limits will improve Outcome: Not  Progressing Goal: Will remain free from infection Outcome: Not Progressing Goal: Diagnostic test results will improve Outcome: Not Progressing Goal: Respiratory complications will improve Outcome: Not Progressing Goal: Cardiovascular complication will be avoided Outcome: Not Progressing    Problem: Activity: Goal: Risk for activity intolerance will decrease Outcome: Not Progressing   Problem: Nutrition: Goal: Adequate nutrition will be maintained Outcome: Not Progressing   Problem: Coping: Goal: Level of anxiety will decrease Outcome: Not Progressing   Problem: Elimination: Goal: Will not experience complications related to bowel motility Outcome: Not Progressing Goal: Will not experience complications related to urinary retention Outcome: Not Progressing   Problem: Pain Managment: Goal: General experience of comfort will improve and/or be controlled Outcome: Not Progressing   Problem: Safety: Goal: Ability to remain free from injury will improve Outcome: Not Progressing   Problem: Skin Integrity: Goal: Risk for impaired skin integrity will decrease Outcome: Not Progressing   Problem: Education: Goal: Ability to describe self-care measures that may prevent or decrease complications (Diabetes Survival Skills Education) will improve Outcome: Not Progressing Goal: Individualized Educational Video(s) Outcome: Not Progressing   Problem: Coping: Goal: Ability to adjust to condition or change in health will improve Outcome: Not Progressing   Problem: Fluid Volume: Goal: Ability to maintain a balanced intake and output will improve Outcome: Not Progressing   Problem: Health Behavior/Discharge Planning: Goal: Ability to identify and utilize available resources and services will improve Outcome: Not Progressing Goal: Ability to manage health-related needs will improve Outcome: Not Progressing   Problem: Metabolic: Goal: Ability to maintain appropriate glucose levels will improve Outcome: Not Progressing   Problem: Nutritional: Goal: Maintenance of adequate nutrition will improve Outcome: Not Progressing Goal: Progress toward achieving an optimal weight will improve Outcome: Not Progressing   Problem: Skin Integrity: Goal: Risk for impaired skin  integrity will decrease Outcome: Not Progressing   Problem: Tissue Perfusion: Goal: Adequacy of tissue perfusion will improve Outcome: Not Progressing   Problem: Education: Goal: Knowledge of General Education information will improve Description: Including pain rating scale, medication(s)/side effects and non-pharmacologic comfort measures Outcome: Not Progressing   Problem: Health Behavior/Discharge Planning: Goal: Ability to manage health-related needs will improve Outcome: Not Progressing   Problem: Clinical Measurements: Goal: Ability to maintain clinical measurements within normal limits will improve Outcome: Not Progressing Goal: Will remain free from infection Outcome: Not Progressing Goal: Diagnostic test results will improve Outcome: Not Progressing Goal: Respiratory complications will improve Outcome: Not Progressing Goal: Cardiovascular complication will be avoided Outcome: Not Progressing   Problem: Activity: Goal: Risk for activity intolerance will decrease Outcome: Not Progressing   Problem: Nutrition: Goal: Adequate nutrition will be maintained Outcome: Not Progressing   Problem: Coping: Goal: Level of anxiety will decrease Outcome: Not Progressing   Problem: Elimination: Goal: Will not experience complications related to bowel motility Outcome: Not Progressing Goal: Will not experience complications related to urinary retention Outcome: Not Progressing   Problem: Pain Managment: Goal: General experience of comfort will improve and/or be controlled Outcome: Not Progressing   Problem: Safety: Goal: Ability to remain free from injury will improve Outcome: Not Progressing   Problem: Skin Integrity: Goal: Risk for impaired skin integrity will decrease Outcome: Not Progressing   Problem: Education: Goal: Ability to describe self-care measures that may prevent or decrease complications (Diabetes Survival Skills Education) will improve Outcome: Not  Progressing Goal: Individualized Educational Video(s) Outcome: Not Progressing   Problem: Coping: Goal: Ability to adjust to condition or change in health will improve Outcome: Not Progressing  Problem: Fluid Volume: Goal: Ability to maintain a balanced intake and output will improve Outcome: Not Progressing   Problem: Health Behavior/Discharge Planning: Goal: Ability to identify and utilize available resources and services will improve Outcome: Not Progressing Goal: Ability to manage health-related needs will improve Outcome: Not Progressing   Problem: Metabolic: Goal: Ability to maintain appropriate glucose levels will improve Outcome: Not Progressing   Problem: Nutritional: Goal: Maintenance of adequate nutrition will improve Outcome: Not Progressing Goal: Progress toward achieving an optimal weight will improve Outcome: Not Progressing   Problem: Skin Integrity: Goal: Risk for impaired skin integrity will decrease Outcome: Not Progressing   Problem: Tissue Perfusion: Goal: Adequacy of tissue perfusion will improve Outcome: Not Progressing   Problem: Education: Goal: Knowledge of General Education information will improve Description: Including pain rating scale, medication(s)/side effects and non-pharmacologic comfort measures Outcome: Not Progressing   Problem: Health Behavior/Discharge Planning: Goal: Ability to manage health-related needs will improve Outcome: Not Progressing   Problem: Clinical Measurements: Goal: Ability to maintain clinical measurements within normal limits will improve Outcome: Not Progressing Goal: Will remain free from infection Outcome: Not Progressing Goal: Diagnostic test results will improve Outcome: Not Progressing Goal: Respiratory complications will improve Outcome: Not Progressing Goal: Cardiovascular complication will be avoided Outcome: Not Progressing   Problem: Activity: Goal: Risk for activity intolerance will  decrease Outcome: Not Progressing   Problem: Nutrition: Goal: Adequate nutrition will be maintained Outcome: Not Progressing   Problem: Coping: Goal: Level of anxiety will decrease Outcome: Not Progressing   Problem: Elimination: Goal: Will not experience complications related to bowel motility Outcome: Not Progressing Goal: Will not experience complications related to urinary retention Outcome: Not Progressing   Problem: Pain Managment: Goal: General experience of comfort will improve and/or be controlled Outcome: Not Progressing   Problem: Safety: Goal: Ability to remain free from injury will improve Outcome: Not Progressing   Problem: Skin Integrity: Goal: Risk for impaired skin integrity will decrease Outcome: Not Progressing

## 2024-04-13 NOTE — Progress Notes (Signed)
   04/13/24 2155  BiPAP/CPAP/SIPAP  $ Non-Invasive Ventilator  Non-Invasive Vent Subsequent  BiPAP/CPAP/SIPAP Pt Type Adult  BiPAP/CPAP/SIPAP SERVO  Mask Type Full face mask  Dentures removed? Not applicable  Mask Size Medium  Set Rate 20 breaths/min  Respiratory Rate 25 breaths/min  IPAP 17 cmH20  EPAP 5 cmH2O  Pressure Support 12 cmH20  PEEP 5 cmH20  FiO2 (%) 40 %  Minute Ventilation 12.7  Leak 51  Peak Inspiratory Pressure (PIP) 17  Tidal Volume (Vt) 428  Patient Home Machine No  Patient Home Mask No  Patient Home Tubing No  Auto Titrate No  Press High Alarm 25 cmH2O  Press Low Alarm 2 cmH2O  Device Plugged into [employer] Power Outlet Yes  Oxygen Percent 40 %  BiPAP/CPAP /SiPAP Vitals  Pulse Rate 96  Resp (!) 21  SpO2 100 %  MEWS Score/Color  MEWS Score 2  MEWS Score Color Yellow

## 2024-04-13 NOTE — Progress Notes (Signed)
 Palliative Medicine Progress Note   Patient Name: Toni [name]       Date: 04/13/2024 DOB: [birth year]  Age: 89 y.o. MRN#: [id number] Attending Physician: Patsy Lenis, MD Primary Care Physician: Patient, No Pcp Per Admit Date: 03/27/2024   HPI/Patient Profile: 89 y.o. female  with past medical history of chronic HFpEF, diabetes type 2, peripheral artery disease, cirrhosis, and HTN who presented to the ED on 03/27/2024 with nausea/vomiting, lethargy, and weakness.  She is admitted with sepsis secondary to UTI and colitis, AKI, lactic acidosis, metabolic acidosis, acute metabolic encephalopathy, and new onset A-fib.   Palliative Medicine has been consulted for goals of care discussions and complex medical decision making.   Subjective: Chart reviewed. Updates received from Dr. Patsy and RN.   Patient assessed at bedside. She is on BiPAP. Her eyes are open, but she does not track or follow commands. Her extremities are cool to touch.   I met with daughter/[name], son-in-law Cathlyn, and granddaughter. Provided updates on patient's current clinical status as per above. I shared my concern that patient's condition has worsened and she seems to be at end-of-life. Discussed the limitations of medical interventions to prolong quality of life when the body fails to thrive.  We discussed the difference between current scope of care (treat the treatable) versus comfort care.  I reviewed the concept of a comfort path as de-escalating full scope medical interventions and allowing a natural course to occur. Discussed transitioning to comfort care while in the hospital, and what that would look like--keeping her clean and dry, no labs, no artificial hydration or feeding, no antibiotics, minimizing of medications,  comfort feeds, and medication for pain and dyspnea as needed.   After further discussion, family has decided continue current supportive interventions over the weekend.  They are agreeable to meet again on Monday 9/29, and seem to have a low threshold to transition to comfort care if there is no improvement or if patient continues to decline.    Objective:  Physical Exam Constitutional:      General: She is not in acute distress.    Appearance: She is ill-appearing.     Comments: Frail appearing  Pulmonary:     Comments: BiPAP Skin:    General: Skin is cool.  Neurological:     Comments: Does not follow commands  Psychiatric:        Speech: She is noncommunicative.             Palliative Medicine Assessment & Plan   Assessment: Active Problems:   Acute metabolic encephalopathy   Goals of care, counseling/discussion   Malnutrition of moderate degree   Pressure injury of skin   Acute respiratory failure with hypoxia (HCC)    Recommendations/Plan: Continue current supportive interventions Plan for another family meeting on Monday 9/29 - time TBD Low threshold for transition to comfort care if patient does not improve or continues to decline  Code Status: DNR with limited interventions   Prognosis:  Poor overall  Discharge Planning: To Be Determined   Thank you for allowing the Palliative Medicine Team to assist in the care of this patient.   Time: 65 minutes  Detailed review of medical records (labs, imaging, vital signs), medically appropriate exam, discussed with treatment team, counseling and education to patient, family, & staff, documenting clinical information, coordination of care.   Signed: Amiya Escamilla B Quanah Majka, NP   Please contact Palliative Medicine Team phone at [phone] for questions and concerns.  For individual providers, please see AMION.

## 2024-04-13 NOTE — Progress Notes (Signed)
 OT Discharge Note  Patient Details Name: Toni [name] MRN: [id number] DOB: 02/20/1933   Cancelled Treatment:    Reason Eval/Treat Not Completed: Patient declined, no reason specified (bipap - not appropriate for attempts at self feeding. Ot to sign off acutely)  Ely Molt 04/13/2024, 10:06 AM

## 2024-04-13 NOTE — Progress Notes (Signed)
 SLP Cancellation Note  Patient Details Name: Toni [name] MRN: [id number] DOB: 1933-05-02   Cancelled treatment:       Reason Eval/Treat Not Completed: Medical issues which prohibited therapy (pt with worsening mentation and now on BiPAP). SLP will f/u as able.    Damien Blumenthal, M.A., CCC-SLP Speech Language Pathology, Acute Rehabilitation Services  Secure Chat preferred [phone]  04/13/2024, 9:11 AM

## 2024-04-13 NOTE — Progress Notes (Signed)
 Progress Note    Toni [name]   FMW:[id number]  DOB: [birth year]  DOA: 03/27/2024     16 PCP: Patient, No Pcp Per  Initial CC: weakness, lethargy, N/V  Hospital Course: 89 y.o. female with past medical history of chronic HFpEF, diabetes type 2, peripheral artery disease, cirrhosis, and HTN who presented to the ED on 03/27/2024 with nausea/vomiting, lethargy, and weakness. She is typically alert but was progressively confused over the course of the day as well.  All history is obtained from chart 2/2 pt's acute encephalopathy.  She is admitted with sepsis secondary to UTI and colitis, AKI, lactic acidosis, metabolic acidosis, acute metabolic encephalopathy, and new onset A-fib. Pt reportedly has frequent uti's in past. UA pending. Lactate elevated on presentation but improving to 3 with volume. Ultimately, she required initiation of norepi to maintain BP. Found to be in aki with Cr 1.5 (baseline 0.7) with some hydronephrosis 2/2 R staghorn stone. There is also stigmata of espophagitis and colitis.   Assessment and Plan:  GOC FTT Acute metabolic encephalopathy  - worsened in general 9/25; shallow breaths and much more fatigued appearing along with severely worsened mentation; not following commands. Found to have vomit on her chest this am too - Hyperammonemia; lactulose  enemas started but minimal change in mentation with some downtrend however suspect larger picture clinically is that she is actively approaching end-of-life -This morning she is still minimally responsive and opens eyes to voice but not moving extremities or following commands.  All 4 extremities are cool to the touch.  Clinically she appears to be approaching end-of-life and main goal should be comfort focused care at this time.  Discussed with palliative care and some family in the room.  Her daughter is coming at the end of work today and anticipate further transition to comfort at that time  Sepsis 2/2 UTI- likely  pyelonephritis and colitis; shock resolved E. Coli & Proteus mirabilis UTI -E. coli resistant to ampicillin  and Proteus resistant to Macrodantin, has been de-escalated to ceftriaxone  based on susceptibilities -Continue midodrine  - Was found to have staghorn calculus in the right kidney without any obstructive processes although did have mild right hydronephrosis and dilatation of the proximal right ureter on ultrasound and CT imaging   Chronic HFpEF  -ECHO  EF 70-75%, hyperdynamic, severe MR calcifications with associated murmur -Currently no acute issues, follow closely for any volume overload - Abdominal x-ray from 04/10/2024 shows gaseous distention of bowel and no visible free air   Acute hypokalemia - Repleted   Atrial fibrillation, new-onset -Now in NSR, likely due to septic shock  - CHA2DS2-VASc 2: 6-if A-fib returns, will need GOC regarding anticoagulation   Cirrhosis/Hyperammonemia -Noted on CT abd/pelvis -Continue lactulose    Normocytic anemia H&H stable   Constipation -Found to have large stool ball in the rectum with mild rectal wall thickening, stercoral colitis -Now having BM   Esophagitis, chronic  CT revealed marked wall thickening of the distal esophagus, esophagitis.  Infiltrative mass has not been excluded - No GI bleed.  Palliative medicine following for GOC - Per palliative medicine, family wishes to continue supportive care while hoping for improvement. Given advanced age and multiple medical problems not a candidate for aggressive treatment otherwise -Continue PPI   Acute kidney injury with mild right hydronephrosis, improving  -Seen by urology, Dr. Renda on 9/10, recommended outpatient follow-up once patient is optimized for consideration of PCNL.   - Cr now normalized   Diabetes mellitus type 2 Continue moderate SSI  every 4 hours,  cont insulin  Lantus  5 units daily -Glycemic trends stable   Moderate protein energy malnutrition -Continue tube feeds,  SLP 9/15-> full liquid diet -Speech therapy following, advanced to dys 3 diet -Dietitian following. Goal is to eventually wean off TF and remove coretrak   Obesity class II Estimated body mass index is 38.39 kg/m as calculated from the following:   Height as of this encounter: 5' 2 (1.575 m).   Weight as of this encounter: 95.2 kg  Interval History:  Unfortunately no improvement since yesterday.  In fact, ongoing worsening.  Extremities are cool to the touch.  She appears to be actively dying at this point. Daughter coming into 4 today and further GOC discussions to be held at that time.  Old records reviewed in assessment of this patient  Antimicrobials:   DVT prophylaxis:  heparin  injection 5,000 Units Start: 03/28/24 0600 SCDs Start: 03/28/24 0230   Code Status:   Code Status: Limited: Do not attempt resuscitation (DNR) -DNR-LIMITED -Do Not Intubate/DNI   Mobility Assessment (Last 72 Hours)     Mobility Assessment     Row Name 04/13/24 0900 04/12/24 2000 04/12/24 0725 04/12/24 0619 04/11/24 0715   Does the patient have exclusion criteria? Yes- Hold (Level 0) - Assessment complete Yes- Hold (Level 0) - Assessment complete No - Perform mobility assessment No - Perform mobility assessment No - Perform mobility assessment   Mobility Assessment Exclusion Criteria Acute decline in neurological status -- -- -- Acute decline in neurological status   What is the highest level of mobility based on the mobility assessment? Level 1 (Bedfast) - Unable to balance while sitting on edge of bed -- Level 1 (Bedfast) - Unable to balance while sitting on edge of bed Level 1 (Bedfast) - Unable to balance while sitting on edge of bed Level 1 (Bedfast) - Unable to balance while sitting on edge of bed   Is the above level different from baseline mobility prior to current illness? No - Consider discontinuing PT/OT -- No - Consider discontinuing PT/OT -- No - Consider discontinuing PT/OT    Row Name  04/11/24 0326           Does the patient have exclusion criteria? No - Perform mobility assessment       What is the highest level of mobility based on the mobility assessment? Level 1 (Bedfast) - Unable to balance while sitting on edge of bed          Barriers to discharge: none Disposition Plan:  anticipate in hospital death  HH orders placed: TBD Status is: Inpt  Objective: Blood pressure 112/64, pulse (!) 102, temperature (!) 97.5 F (36.4 C), temperature source Axillary, resp. rate 15, height 5' 2 (1.575 m), weight 95 kg, SpO2 99%.  Examination:  Physical Exam Constitutional:      Appearance: She is ill-appearing (chronic).     Comments: Opens eyes to voice but otherwise not responsive  HENT:     Head: Normocephalic and atraumatic.     Mouth/Throat:     Mouth: Mucous membranes are moist.  Eyes:     Extraocular Movements: Extraocular movements intact.  Cardiovascular:     Rate and Rhythm: Regular rhythm. Tachycardia present.  Pulmonary:     Effort: Pulmonary effort is normal. Tachypnea present. No respiratory distress.     Breath sounds: No wheezing.     Comments: Shallow breaths persist Abdominal:     General: Bowel sounds are normal. There is no distension.  Palpations: Abdomen is soft.     Tenderness: There is no abdominal tenderness.     Comments: PEG in place  Musculoskeletal:        General: Normal range of motion.     Cervical back: Normal range of motion and neck supple.  Skin:    General: Skin is warm and dry.  Neurological:     Comments: Does not withdrawal to painful stimuli and not following commands      Consultants:    Procedures:    Data Reviewed: Results for orders placed or performed during the hospital encounter of 03/27/24 (from the past 24 hours)  Glucose, capillary     Status: Abnormal   Collection Time: 04/12/24  9:20 PM  Result Value Ref Range   Glucose-Capillary 210 (H) 70 - 99 mg/dL  Glucose, capillary     Status: Abnormal    Collection Time: 04/13/24 12:12 AM  Result Value Ref Range   Glucose-Capillary 214 (H) 70 - 99 mg/dL  Ammonia     Status: Abnormal   Collection Time: 04/13/24  2:54 AM  Result Value Ref Range   Ammonia 97 (H) 9 - 35 umol/L  Magnesium      Status: None   Collection Time: 04/13/24  2:54 AM  Result Value Ref Range   Magnesium  2.1 1.7 - 2.4 mg/dL  Basic metabolic panel with GFR     Status: Abnormal   Collection Time: 04/13/24  2:54 AM  Result Value Ref Range   Sodium 141 135 - 145 mmol/L   Potassium 3.9 3.5 - 5.1 mmol/L   Chloride 109 98 - 111 mmol/L   CO2 23 22 - 32 mmol/L   Glucose, Bld 232 (H) 70 - 99 mg/dL   BUN 26 (H) 8 - 23 mg/dL   Creatinine, Ser 9.49 0.44 - 1.00 mg/dL   Calcium 8.7 (L) 8.9 - 10.3 mg/dL   GFR, Estimated >39 >39 mL/min   Anion gap 9 5 - 15  Glucose, capillary     Status: Abnormal   Collection Time: 04/13/24  4:13 AM  Result Value Ref Range   Glucose-Capillary 212 (H) 70 - 99 mg/dL  CBC with Differential/Platelet     Status: Abnormal   Collection Time: 04/13/24  6:46 AM  Result Value Ref Range   WBC 11.5 (H) 4.0 - 10.5 K/uL   RBC 3.67 (L) 3.87 - 5.11 MIL/uL   Hemoglobin 10.1 (L) 12.0 - 15.0 g/dL   HCT 65.1 (L) 63.9 - 53.9 %   MCV 94.8 80.0 - 100.0 fL   MCH 27.5 26.0 - 34.0 pg   MCHC 29.0 (L) 30.0 - 36.0 g/dL   RDW 84.6 88.4 - 84.4 %   Platelets 383 150 - 400 K/uL   nRBC 3.3 (H) 0.0 - 0.2 %   Neutrophils Relative % 65 %   Neutro Abs 7.5 1.7 - 7.7 K/uL   Lymphocytes Relative 19 %   Lymphs Abs 2.2 0.7 - 4.0 K/uL   Monocytes Relative 16 %   Monocytes Absolute 1.8 (H) 0.1 - 1.0 K/uL   Eosinophils Relative 0 %   Eosinophils Absolute 0.0 0.0 - 0.5 K/uL   Basophils Relative 0 %   Basophils Absolute 0.0 0.0 - 0.1 K/uL   WBC Morphology See Note    Smear Review See Note    Polychromasia PRESENT   Glucose, capillary     Status: Abnormal   Collection Time: 04/13/24  8:38 AM  Result Value Ref Range   Glucose-Capillary  195 (H) 70 - 99 mg/dL  Blood gas,  venous     Status: Abnormal   Collection Time: 04/13/24 12:22 PM  Result Value Ref Range   pH, Ven 7.21 (L) 7.25 - 7.43   pCO2, Ven 69 (H) 44 - 60 mmHg   pO2, Ven <31 (LL) 32 - 45 mmHg   Bicarbonate 27.6 20.0 - 28.0 mmol/L   Acid-base deficit 1.9 0.0 - 2.0 mmol/L   O2 Saturation 46.8 %   Patient temperature 37.0   Glucose, capillary     Status: Abnormal   Collection Time: 04/13/24  1:06 PM  Result Value Ref Range   Glucose-Capillary 208 (H) 70 - 99 mg/dL    I have reviewed pertinent nursing notes, vitals, labs, and images as necessary. I have ordered labwork to follow up on as indicated.  I have reviewed the last notes from staff over past 24 hours. I have discussed patient's care plan and test results with nursing staff, CM/SW, and other staff as appropriate.  Time spent: Greater than 50% of the 55 minute visit was spent in counseling/coordination of care for the patient as laid out in the A&P.   LOS: 16 days   Alm Apo, MD [facility] 04/13/2024, 3:59 PM

## 2024-04-13 NOTE — TOC Progression Note (Signed)
 Transition of Care [facility]) - Progression Note    Patient Details  Name: Toni [name] MRN: [id number] Date of Birth: 1933-02-03  Transition of Care [facility] CM/SW Contact  Luise [name], [place] Phone Number: 04/13/2024, 8:55 AM  Clinical Narrative:    Patient currently has a cortrak. CSW will fax referrals for SNF once there are plans for cortrak removal.   CSW will continue to follow and monitor patients progress in progression.   Expected Discharge Plan: Skilled Nursing Facility Barriers to Discharge: Continued Medical Work up               Expected Discharge Plan and Services In-house Referral: Clinical Social Work     Living arrangements for the past 2 months: Single Family Home                                       Social Drivers of Health (SDOH) Interventions SDOH Screenings   Food Insecurity: Patient Unable To Answer (03/30/2024)  Housing: Patient Unable To Answer (03/30/2024)  Transportation Needs: Patient Unable To Answer (03/30/2024)  Utilities: Patient Unable To Answer (03/30/2024)  Social Connections: Patient Unable To Answer (03/30/2024)  Tobacco Use: Low Risk  (03/27/2024)    Readmission Risk Interventions     No data to display

## 2024-04-14 DIAGNOSIS — Z515 Encounter for palliative care: Secondary | ICD-10-CM | POA: Diagnosis not present

## 2024-04-14 DIAGNOSIS — A419 Sepsis, unspecified organism: Secondary | ICD-10-CM | POA: Diagnosis not present

## 2024-04-14 DIAGNOSIS — J9601 Acute respiratory failure with hypoxia: Secondary | ICD-10-CM | POA: Diagnosis not present

## 2024-04-14 DIAGNOSIS — G9341 Metabolic encephalopathy: Secondary | ICD-10-CM | POA: Diagnosis not present

## 2024-04-14 DIAGNOSIS — R627 Adult failure to thrive: Secondary | ICD-10-CM

## 2024-04-14 DIAGNOSIS — Z7189 Other specified counseling: Secondary | ICD-10-CM | POA: Diagnosis not present

## 2024-04-14 LAB — BASIC METABOLIC PANEL WITH GFR
Anion gap: 12 (ref 5–15)
BUN: 28 mg/dL — ABNORMAL HIGH (ref 8–23)
CO2: 24 mmol/L (ref 22–32)
Calcium: 8.6 mg/dL — ABNORMAL LOW (ref 8.9–10.3)
Chloride: 108 mmol/L (ref 98–111)
Creatinine, Ser: 0.51 mg/dL (ref 0.44–1.00)
GFR, Estimated: >60 mL/min (ref >60)
Glucose, Bld: 175 mg/dL — ABNORMAL HIGH (ref 70–99)
Potassium: 3.8 mmol/L (ref 3.5–5.1)
Sodium: 144 mmol/L (ref 135–145)

## 2024-04-14 LAB — CBC WITH DIFFERENTIAL/PLATELET
Abs Immature Granulocytes: 0.12 K/uL — ABNORMAL HIGH (ref 0.00–0.07)
Basophils Absolute: 0.1 K/uL (ref 0.0–0.1)
Basophils Relative: 1 %
Eosinophils Absolute: 0.1 K/uL (ref 0.0–0.5)
Eosinophils Relative: 1 %
HCT: 33.5 % — ABNORMAL LOW (ref 36.0–46.0)
Hemoglobin: 9.6 g/dL — ABNORMAL LOW (ref 12.0–15.0)
Immature Granulocytes: 2 %
Lymphocytes Relative: 24 %
Lymphs Abs: 1.5 K/uL (ref 0.7–4.0)
MCH: 27 pg (ref 26.0–34.0)
MCHC: 28.7 g/dL — ABNORMAL LOW (ref 30.0–36.0)
MCV: 94.1 fL (ref 80.0–100.0)
Monocytes Absolute: 0.5 K/uL (ref 0.1–1.0)
Monocytes Relative: 8 %
Neutro Abs: 4 K/uL (ref 1.7–7.7)
Neutrophils Relative %: 64 %
Platelets: 305 K/uL (ref 150–400)
RBC: 3.56 MIL/uL — ABNORMAL LOW (ref 3.87–5.11)
RDW: 15.4 % (ref 11.5–15.5)
Smear Review: NORMAL
WBC: 6.2 K/uL (ref 4.0–10.5)
nRBC: 5.3 % — ABNORMAL HIGH (ref 0.0–0.2)

## 2024-04-14 LAB — GLUCOSE, CAPILLARY
Glucose-Capillary: 161 mg/dL — ABNORMAL HIGH (ref 70–99)
Glucose-Capillary: 162 mg/dL — ABNORMAL HIGH (ref 70–99)
Glucose-Capillary: 219 mg/dL — ABNORMAL HIGH (ref 70–99)
Glucose-Capillary: 231 mg/dL — ABNORMAL HIGH (ref 70–99)
Glucose-Capillary: 216 mg/dL — ABNORMAL HIGH (ref 70–99)
Glucose-Capillary: 217 mg/dL — ABNORMAL HIGH (ref 70–99)

## 2024-04-14 LAB — MAGNESIUM: Magnesium: 2.1 mg/dL (ref 1.7–2.4)

## 2024-04-14 LAB — AMMONIA: Ammonia: 41 umol/L — ABNORMAL HIGH (ref 9–35)

## 2024-04-14 MED ORDER — SODIUM CHLORIDE (PF) 0.9 % IJ SOLN
INTRAMUSCULAR | Status: AC
  Administered 2024-04-14: 10 mL
  Filled 2024-04-14: qty 10

## 2024-04-14 MED ORDER — LACTULOSE 10 GM/15ML PO SOLN
30.0000 g | Freq: Two times a day (BID) | ORAL | Status: DC
  Administered 2024-04-14 – 2024-04-15 (×4): 30 g
  Filled 2024-04-14 (×4): qty 45

## 2024-04-14 NOTE — Progress Notes (Signed)
 Palliative Medicine Progress Note   Patient Name: Toni [name]       Date: 04/14/2024 DOB: Mar 30, 1933  Age: 89 y.o. MRN#: [id number] Attending Physician: Patsy Lenis, MD Primary Care Physician: Patient, No Pcp Per Admit Date: 03/27/2024  Reason for Consultation/Follow-up: {Reason for Consult:23484}  HPI/Patient Profile: 89 y.o. female  with past medical history of chronic HFpEF, diabetes type 2, peripheral artery disease, cirrhosis, and HTN who presented to the ED on 03/27/2024 with nausea/vomiting, lethargy, and weakness.  She is admitted with sepsis secondary to UTI and colitis, AKI, lactic acidosis, metabolic acidosis, acute metabolic encephalopathy, and new onset A-fib.   Palliative Medicine has been consulted for goals of care discussions and complex medical decision making.   Subjective: Chart reviewed. Updates received. Patient assessed. She is on BiPAP, attempting to pull at the mask. She has her eyes open and tracks, but does not follow commands.   Objective:  Physical Exam Vitals reviewed.  Constitutional:      General: She is awake. She is not in acute distress. Cardiovascular:     Rate and Rhythm: Tachycardia present.             Vital Signs: BP 112/68 (BP Location: Left Arm)   Pulse (!) 103   Temp 98.7 F (37.1 C) (Axillary)   Resp 18   Ht 5' 2 (1.575 m)   Wt 95 kg   SpO2 100%   BMI 38.31 kg/m  SpO2: SpO2: 100 % O2 Device: O2 Device: Nasal Cannula O2 Flow Rate: O2 Flow Rate (L/min): 4 L/min  Intake/output summary:  Intake/Output Summary (Last 24 hours) at 04/14/2024 1345 Last data filed at 04/14/2024 0700 Gross per 24 hour  Intake 1410 ml  Output 900 ml  Net 510 ml    LBM: Last BM Date : 04/14/24     Palliative Assessment/Data: ***     Palliative  Medicine Assessment & Plan   Assessment: Active Problems:   Acute metabolic encephalopathy   Goals of care, counseling/discussion   Malnutrition of moderate degree   Pressure injury of skin   Acute respiratory failure with hypoxia (HCC)    Recommendations/Plan: ***  Goals of Care and Additional Recommendations: Limitations on Scope of Treatment: {Recommended Scope and Preferences:21019}  Code Status:   Prognosis:  {Palliative Care Prognosis:23504}  Discharge Planning: {Palliative dispostion:23505}  Care plan was discussed with ***  Thank you for allowing the Palliative Medicine Team to assist in the care of this patient.   ***   Recardo KATHEE Loll, NP   Please contact Palliative Medicine Team phone at [phone] for questions and concerns.  For individual providers, please see AMION.

## 2024-04-14 NOTE — Progress Notes (Signed)
 Pt currently on bipap machine while sleeping,  placed by RN. Pt tolerating it well at this time.

## 2024-04-14 NOTE — Plan of Care (Signed)
  Problem: Education: Goal: Ability to describe self-care measures that may prevent or decrease complications (Diabetes Survival Skills Education) will improve Outcome: Progressing   Problem: Safety: Goal: Ability to remain free from injury will improve Outcome: Progressing   Problem: Skin Integrity: Goal: Risk for impaired skin integrity will decrease Outcome: Progressing   Problem: Pain Managment: Goal: General experience of comfort will improve and/or be controlled Outcome: Progressing   Problem: Elimination: Goal: Will not experience complications related to urinary retention Outcome: Progressing   Problem: Coping: Goal: Level of anxiety will decrease Outcome: Progressing   Problem: Nutrition: Goal: Adequate nutrition will be maintained Outcome: Progressing

## 2024-04-14 NOTE — Progress Notes (Signed)
 Progress Note    IA LEEB   FMW:[id number]  DOB: [birth year]  DOA: 03/27/2024     17 PCP: Patient, No Pcp Per  Initial CC: weakness, lethargy, N/V  Hospital Course: 89 y.o. female with past medical history of chronic HFpEF, diabetes type 2, peripheral artery disease, cirrhosis, and HTN who presented to the ED on 03/27/2024 with nausea/vomiting, lethargy, and weakness. She is typically alert but was progressively confused over the course of the day as well.  All history is obtained from chart 2/2 pt's acute encephalopathy.  She is admitted with sepsis secondary to UTI and colitis, AKI, lactic acidosis, metabolic acidosis, acute metabolic encephalopathy, and new onset A-fib. Pt reportedly has frequent uti's in past. UA pending. Lactate elevated on presentation but improving to 3 with volume. Ultimately, she required initiation of norepi to maintain BP. Found to be in aki with Cr 1.5 (baseline 0.7) with some hydronephrosis 2/2 R staghorn stone. There is also stigmata of espophagitis and colitis.   Assessment and Plan:  GOC FTT Acute metabolic encephalopathy  - worsened in general 9/25; shallow breaths and much more fatigued appearing along with severely worsened mentation; not following commands. Found to have vomit on her chest this am too - Hyperammonemia; lactulose  enemas started but minimal change in mentation with some downtrend however suspect larger picture clinically is that she is actively approaching end-of-life - still minimally responsive this morning; opens eyes and tracks but not following any commands; some grimace to pain - Palliative care has met with daughter and tentative plan is watchful waiting over the weekend  Sepsis 2/2 UTI- likely pyelonephritis and colitis; shock resolved E. Coli & Proteus mirabilis UTI -E. coli resistant to ampicillin  and Proteus resistant to Macrodantin, has been de-escalated to ceftriaxone  based on susceptibilities -Continue midodrine  - Was  found to have staghorn calculus in the right kidney without any obstructive processes although did have mild right hydronephrosis and dilatation of the proximal right ureter on ultrasound and CT imaging   Chronic HFpEF  -ECHO  EF 70-75%, hyperdynamic, severe MR calcifications with associated murmur -Currently no acute issues, follow closely for any volume overload - Abdominal x-ray from 04/10/2024 shows gaseous distention of bowel and no visible free air   Acute hypokalemia - Repleted   Atrial fibrillation, new-onset -Now in NSR, likely due to septic shock  - CHA2DS2-VASc 2: 6-if A-fib returns, will need GOC regarding anticoagulation   Cirrhosis/Hyperammonemia -Noted on CT abd/pelvis -Continue lactulose    Normocytic anemia H&H stable   Constipation -Found to have large stool ball in the rectum with mild rectal wall thickening, stercoral colitis -Now having BM   Esophagitis, chronic  CT revealed marked wall thickening of the distal esophagus, esophagitis.  Infiltrative mass has not been excluded - No GI bleed.  Palliative medicine following for GOC - Per palliative medicine, family wishes to continue supportive care while hoping for improvement. Given advanced age and multiple medical problems not a candidate for aggressive treatment otherwise -Continue PPI   Acute kidney injury with mild right hydronephrosis, improving  -Seen by urology, Dr. Renda on 9/10, recommended outpatient follow-up once patient is optimized for consideration of PCNL.   - Cr now normalized   Diabetes mellitus type 2 Continue moderate SSI every 4 hours,  cont insulin  Lantus  5 units daily -Glycemic trends stable   Moderate protein energy malnutrition -Continue tube feeds, SLP 9/15-> full liquid diet -Speech therapy following, advanced to dys 3 diet -Dietitian following. Goal is to eventually wean off  TF and remove coretrak   Obesity class II Estimated body mass index is 38.39 kg/m as calculated from  the following:   Height as of this encounter: 5' 2 (1.575 m).   Weight as of this encounter: 95.2 kg  Interval History:  No events overnight but remains unimproved.  Opens her eyes to voice and tracks a little bit but does not follow any commands.  A little bit of movement in her upper extremities but she continues to remain BiPAP dependent.  Old records reviewed in assessment of this patient  Antimicrobials:   DVT prophylaxis:  heparin  injection 5,000 Units Start: 03/28/24 0600 SCDs Start: 03/28/24 0230   Code Status:   Code Status: Limited: Do not attempt resuscitation (DNR) -DNR-LIMITED -Do Not Intubate/DNI   Mobility Assessment (Last 72 Hours)     Mobility Assessment     Row Name 04/14/24 0900 04/13/24 2030 04/13/24 0900 04/12/24 2000 04/12/24 0725   Does the patient have exclusion criteria? Yes- Hold (Level 0) - Assessment complete Yes- Hold (Level 0) - Assessment complete Yes- Hold (Level 0) - Assessment complete Yes- Hold (Level 0) - Assessment complete No - Perform mobility assessment   Mobility Assessment Exclusion Criteria Acute decline in neurological status Acute decline in neurological status Acute decline in neurological status -- --   What is the highest level of mobility based on the mobility assessment? Level 1 (Bedfast) - Unable to balance while sitting on edge of bed Level 1 (Bedfast) - Unable to balance while sitting on edge of bed Level 1 (Bedfast) - Unable to balance while sitting on edge of bed -- Level 1 (Bedfast) - Unable to balance while sitting on edge of bed   Is the above level different from baseline mobility prior to current illness? No - Consider discontinuing PT/OT No - Consider discontinuing PT/OT No - Consider discontinuing PT/OT -- No - Consider discontinuing PT/OT    Row Name 04/12/24 [phone]           Does the patient have exclusion criteria? No - Perform mobility assessment       What is the highest level of mobility based on the mobility  assessment? Level 1 (Bedfast) - Unable to balance while sitting on edge of bed          Barriers to discharge: none Disposition Plan:  anticipate in hospital death  HH orders placed: TBD Status is: Inpt  Objective: Blood pressure 112/68, pulse (!) 103, temperature 98.7 F (37.1 C), temperature source Axillary, resp. rate 18, height 5' 2 (1.575 m), weight 95 kg, SpO2 100%.  Examination:  Physical Exam Constitutional:      Appearance: She is ill-appearing (chronic).     Comments: Opens eyes to voice but otherwise not responsive  HENT:     Head: Normocephalic and atraumatic.     Mouth/Throat:     Mouth: Mucous membranes are moist.  Eyes:     Extraocular Movements: Extraocular movements intact.  Cardiovascular:     Rate and Rhythm: Regular rhythm. Tachycardia present.  Pulmonary:     Effort: Pulmonary effort is normal. Tachypnea present. No respiratory distress.     Breath sounds: No wheezing.     Comments: Shallow breaths persist Abdominal:     General: Bowel sounds are normal. There is no distension.     Palpations: Abdomen is soft.     Tenderness: There is no abdominal tenderness.     Comments: PEG in place  Musculoskeletal:  General: Normal range of motion.     Cervical back: Normal range of motion and neck supple.  Skin:    General: Skin is warm and dry.  Neurological:     Comments: Does not withdrawal to painful stimuli and not following commands      Consultants:    Procedures:    Data Reviewed: Results for orders placed or performed during the hospital encounter of 03/27/24 (from the past 24 hours)  Glucose, capillary     Status: Abnormal   Collection Time: 04/13/24  1:06 PM  Result Value Ref Range   Glucose-Capillary 208 (H) 70 - 99 mg/dL  Glucose, capillary     Status: Abnormal   Collection Time: 04/13/24  4:10 PM  Result Value Ref Range   Glucose-Capillary 264 (H) 70 - 99 mg/dL  Glucose, capillary     Status: Abnormal   Collection Time:  04/13/24  7:41 PM  Result Value Ref Range   Glucose-Capillary 259 (H) 70 - 99 mg/dL  Glucose, capillary     Status: Abnormal   Collection Time: 04/13/24  8:35 PM  Result Value Ref Range   Glucose-Capillary 180 (H) 70 - 99 mg/dL  Glucose, capillary     Status: Abnormal   Collection Time: 04/13/24 11:58 PM  Result Value Ref Range   Glucose-Capillary 162 (H) 70 - 99 mg/dL  CBC with Differential/Platelet     Status: Abnormal   Collection Time: 04/14/24  2:59 AM  Result Value Ref Range   WBC 6.2 4.0 - 10.5 K/uL   RBC 3.56 (L) 3.87 - 5.11 MIL/uL   Hemoglobin 9.6 (L) 12.0 - 15.0 g/dL   HCT 66.4 (L) 63.9 - 53.9 %   MCV 94.1 80.0 - 100.0 fL   MCH 27.0 26.0 - 34.0 pg   MCHC 28.7 (L) 30.0 - 36.0 g/dL   RDW 84.5 88.4 - 84.4 %   Platelets 305 150 - 400 K/uL   nRBC 5.3 (H) 0.0 - 0.2 %   Neutrophils Relative % 64 %   Neutro Abs 4.0 1.7 - 7.7 K/uL   Lymphocytes Relative 24 %   Lymphs Abs 1.5 0.7 - 4.0 K/uL   Monocytes Relative 8 %   Monocytes Absolute 0.5 0.1 - 1.0 K/uL   Eosinophils Relative 1 %   Eosinophils Absolute 0.1 0.0 - 0.5 K/uL   Basophils Relative 1 %   Basophils Absolute 0.1 0.0 - 0.1 K/uL   WBC Morphology See Note    Smear Review Normal platelet morphology    Immature Granulocytes 2 %   Abs Immature Granulocytes 0.12 (H) 0.00 - 0.07 K/uL   Polychromasia PRESENT   Magnesium      Status: None   Collection Time: 04/14/24  2:59 AM  Result Value Ref Range   Magnesium  2.1 1.7 - 2.4 mg/dL  Basic metabolic panel with GFR     Status: Abnormal   Collection Time: 04/14/24  2:59 AM  Result Value Ref Range   Sodium 144 135 - 145 mmol/L   Potassium 3.8 3.5 - 5.1 mmol/L   Chloride 108 98 - 111 mmol/L   CO2 24 22 - 32 mmol/L   Glucose, Bld 175 (H) 70 - 99 mg/dL   BUN 28 (H) 8 - 23 mg/dL   Creatinine, Ser 9.48 0.44 - 1.00 mg/dL   Calcium 8.6 (L) 8.9 - 10.3 mg/dL   GFR, Estimated >39 >39 mL/min   Anion gap 12 5 - 15  Ammonia     Status: Abnormal  Collection Time: 04/14/24  2:59 AM   Result Value Ref Range   Ammonia 41 (H) 9 - 35 umol/L  Glucose, capillary     Status: Abnormal   Collection Time: 04/14/24  4:12 AM  Result Value Ref Range   Glucose-Capillary 161 (H) 70 - 99 mg/dL  Glucose, capillary     Status: Abnormal   Collection Time: 04/14/24  7:48 AM  Result Value Ref Range   Glucose-Capillary 219 (H) 70 - 99 mg/dL  Glucose, capillary     Status: Abnormal   Collection Time: 04/14/24 10:31 AM  Result Value Ref Range   Glucose-Capillary 231 (H) 70 - 99 mg/dL    I have reviewed pertinent nursing notes, vitals, labs, and images as necessary. I have ordered labwork to follow up on as indicated.  I have reviewed the last notes from staff over past 24 hours. I have discussed patient's care plan and test results with nursing staff, CM/SW, and other staff as appropriate.  Time spent: Greater than 50% of the 55 minute visit was spent in counseling/coordination of care for the patient as laid out in the A&P.   LOS: 17 days   Alm Apo, MD [facility] 04/14/2024, 12:49 PM

## 2024-04-15 DIAGNOSIS — Z7189 Other specified counseling: Secondary | ICD-10-CM | POA: Diagnosis not present

## 2024-04-15 DIAGNOSIS — J9601 Acute respiratory failure with hypoxia: Secondary | ICD-10-CM | POA: Diagnosis not present

## 2024-04-15 DIAGNOSIS — G9341 Metabolic encephalopathy: Secondary | ICD-10-CM | POA: Diagnosis not present

## 2024-04-15 DIAGNOSIS — A419 Sepsis, unspecified organism: Secondary | ICD-10-CM | POA: Diagnosis not present

## 2024-04-15 LAB — BASIC METABOLIC PANEL WITH GFR
Anion gap: 8 (ref 5–15)
BUN: 27 mg/dL — ABNORMAL HIGH (ref 8–23)
CO2: 28 mmol/L (ref 22–32)
Calcium: 8.4 mg/dL — ABNORMAL LOW (ref 8.9–10.3)
Chloride: 111 mmol/L (ref 98–111)
Creatinine, Ser: 0.48 mg/dL (ref 0.44–1.00)
GFR, Estimated: >60 mL/min (ref >60)
Glucose, Bld: 237 mg/dL — ABNORMAL HIGH (ref 70–99)
Potassium: 3.1 mmol/L — ABNORMAL LOW (ref 3.5–5.1)
Sodium: 147 mmol/L — ABNORMAL HIGH (ref 135–145)

## 2024-04-15 LAB — GLUCOSE, CAPILLARY
Glucose-Capillary: 170 mg/dL — ABNORMAL HIGH (ref 70–99)
Glucose-Capillary: 192 mg/dL — ABNORMAL HIGH (ref 70–99)
Glucose-Capillary: 199 mg/dL — ABNORMAL HIGH (ref 70–99)
Glucose-Capillary: 204 mg/dL — ABNORMAL HIGH (ref 70–99)
Glucose-Capillary: 211 mg/dL — ABNORMAL HIGH (ref 70–99)
Glucose-Capillary: 227 mg/dL — ABNORMAL HIGH (ref 70–99)

## 2024-04-15 LAB — CBC WITH DIFFERENTIAL/PLATELET
Basophils Absolute: 0 K/uL (ref 0.0–0.1)
Basophils Relative: 0 %
Eosinophils Absolute: 0.1 K/uL (ref 0.0–0.5)
Eosinophils Relative: 1 %
HCT: 30.4 % — ABNORMAL LOW (ref 36.0–46.0)
Hemoglobin: 8.6 g/dL — ABNORMAL LOW (ref 12.0–15.0)
Lymphocytes Relative: 12 %
Lymphs Abs: 0.8 K/uL (ref 0.7–4.0)
MCH: 26.5 pg (ref 26.0–34.0)
MCHC: 28.3 g/dL — ABNORMAL LOW (ref 30.0–36.0)
MCV: 93.8 fL (ref 80.0–100.0)
Monocytes Absolute: 0.4 K/uL (ref 0.1–1.0)
Monocytes Relative: 7 %
Neutro Abs: 5 K/uL (ref 1.7–7.7)
Neutrophils Relative %: 80 %
Platelets: 240 K/uL (ref 150–400)
RBC: 3.24 MIL/uL — ABNORMAL LOW (ref 3.87–5.11)
RDW: 15.3 % (ref 11.5–15.5)
WBC: 6.3 K/uL (ref 4.0–10.5)
nRBC: 3.5 % — ABNORMAL HIGH (ref 0.0–0.2)

## 2024-04-15 LAB — MAGNESIUM: Magnesium: 2.1 mg/dL (ref 1.7–2.4)

## 2024-04-15 MED ORDER — POLYETHYLENE GLYCOL 3350 17 G PO PACK
17.0000 g | PACK | Freq: Every day | ORAL | Status: DC | PRN
Start: 2024-04-15 — End: 2024-04-15

## 2024-04-15 MED ORDER — POTASSIUM CHLORIDE 10 MEQ/100ML IV SOLN
10.0000 meq | INTRAVENOUS | Status: AC
  Administered 2024-04-15 (×4): 10 meq via INTRAVENOUS
  Filled 2024-04-15 (×4): qty 100

## 2024-04-15 MED ORDER — K PHOS MONO-SOD PHOS DI & MONO 155-852-130 MG PO TABS
500.0000 mg | ORAL_TABLET | Freq: Two times a day (BID) | ORAL | Status: DC
  Administered 2024-04-16 (×2): 500 mg via ORAL
  Filled 2024-04-15 (×2): qty 2

## 2024-04-15 MED ORDER — MIDODRINE HCL 5 MG PO TABS
5.0000 mg | ORAL_TABLET | Freq: Three times a day (TID) | ORAL | Status: DC
  Administered 2024-04-16 – 2024-04-17 (×4): 5 mg via ORAL
  Filled 2024-04-15 (×4): qty 1

## 2024-04-15 MED ORDER — LACTULOSE 10 GM/15ML PO SOLN
30.0000 g | Freq: Two times a day (BID) | ORAL | Status: DC
  Administered 2024-04-16 (×2): 30 g via ORAL
  Filled 2024-04-15 (×2): qty 45

## 2024-04-15 MED ORDER — POLYETHYLENE GLYCOL 3350 17 G PO PACK: 17.0000 g | PACK | Freq: Every day | ORAL | Status: DC | PRN

## 2024-04-15 NOTE — Progress Notes (Signed)
 Progress Note    Toni [name]   FMW:[id number]  DOB: Aug 21, 1932  DOA: 03/27/2024     18 PCP: Patient, No Pcp Per  Initial CC: weakness, lethargy, N/V  Hospital Course: 89 y.o. female with past medical history of chronic HFpEF, diabetes type 2, peripheral artery disease, cirrhosis, and HTN who presented to the ED on 03/27/2024 with nausea/vomiting, lethargy, and weakness. She is typically alert but was progressively confused over the course of the day as well.  All history is obtained from chart 2/2 pt's acute encephalopathy.  She is admitted with sepsis secondary to UTI and colitis, AKI, lactic acidosis, metabolic acidosis, acute metabolic encephalopathy, and new onset A-fib. Pt reportedly has frequent uti's in past. UA pending. Lactate elevated on presentation but improving to 3 with volume. Ultimately, she required initiation of norepi to maintain BP. Found to be in aki with Cr 1.5 (baseline 0.7) with some hydronephrosis 2/2 R staghorn stone. There is also stigmata of espophagitis and colitis.   Assessment and Plan:  GOC FTT Acute metabolic encephalopathy  - worsened in general 9/25; shallow breaths and much more fatigued appearing along with severely worsened mentation; not following commands. Found to have vomit on her chest  - Hyperammonemia; lactulose  enemas started but minimal change in mentation with some downtrend however suspect larger picture clinically is that she is actively approaching end-of-life - still minimally responsive this morning; opens eyes and tracks some but not following any commands; some grimace to pain - Palliative care has met with daughter and tentative plan is watchful waiting over the weekend - minimal change as of 9/28, no improvement aside from off bipap for now  Sepsis 2/2 UTI- likely pyelonephritis and colitis; shock resolved E. Coli & Proteus mirabilis UTI -E. coli resistant to ampicillin  and Proteus resistant to Macrodantin, has been de-escalated  to ceftriaxone  based on susceptibilities -Continue midodrine  - Was found to have staghorn calculus in the right kidney without any obstructive processes although did have mild right hydronephrosis and dilatation of the proximal right ureter on ultrasound and CT imaging   Chronic HFpEF  -ECHO  EF 70-75%, hyperdynamic, severe MR calcifications with associated murmur -Currently no acute issues, follow closely for any volume overload - Abdominal x-ray from 04/10/2024 shows gaseous distention of bowel and no visible free air   Acute hypokalemia - Repleted   Atrial fibrillation, new-onset -Now in NSR, likely due to septic shock  - CHA2DS2-VASc 2: 6-if A-fib returns, will need GOC regarding anticoagulation   Cirrhosis/Hyperammonemia -Noted on CT abd/pelvis -Continue lactulose    Normocytic anemia H&H stable   Constipation -Found to have large stool ball in the rectum with mild rectal wall thickening, stercoral colitis -Now having BM   Esophagitis, chronic  CT revealed marked wall thickening of the distal esophagus, esophagitis.  Infiltrative mass has not been excluded - No GI bleed.  Palliative medicine following for GOC - Per palliative medicine, family wishes to continue supportive care while hoping for improvement. Given advanced age and multiple medical problems not a candidate for aggressive treatment otherwise -Continue PPI   Acute kidney injury with mild right hydronephrosis, improving  -Seen by urology, Dr. Renda on 9/10, recommended outpatient follow-up once patient is optimized for consideration of PCNL.   - Cr now normalized   Diabetes mellitus type 2 - continue lantus  and SSI   Moderate protein energy malnutrition -Continue tube feeds, SLP 9/15-> full liquid diet -Speech therapy following, advanced to dys 3 diet -Dietitian following. Goal is/was to eventually  wean off TF and remove coretrak - poor prognosis with decline; not likely to maintain oral nutrition anytime  soon; either comfort care/pleasure feeding or PEG (would not recommend the latter)   Obesity class II Estimated body mass index is 38.39 kg/m as calculated from the following:   Height as of this encounter: 5' 2 (1.575 m).   Weight as of this encounter: 95.2 kg  Interval History:  No events overnight but still minimally responsive.  Opens eyes and tracks some but still not following any commands.  Old records reviewed in assessment of this patient  Antimicrobials:   DVT prophylaxis:  heparin  injection 5,000 Units Start: 03/28/24 0600 SCDs Start: 03/28/24 0230   Code Status:   Code Status: Limited: Do not attempt resuscitation (DNR) -DNR-LIMITED -Do Not Intubate/DNI   Mobility Assessment (Last 72 Hours)     Mobility Assessment     Row Name 04/15/24 0837 04/14/24 2015 04/14/24 0900 04/13/24 2030 04/13/24 0900   Does the patient have exclusion criteria? Yes- Hold (Level 0) - Assessment complete Yes - Bedfast (Level 1) - Select exclusion criteria in next row Yes- Hold (Level 0) - Assessment complete Yes- Hold (Level 0) - Assessment complete Yes- Hold (Level 0) - Assessment complete   Mobility Assessment Exclusion Criteria -- MS/PC:  Medically unstable;Acute decline in neurological status Acute decline in neurological status Acute decline in neurological status Acute decline in neurological status   What is the highest level of mobility based on the mobility assessment? Level 1 (Bedfast) - Unable to balance while sitting on edge of bed -- Level 1 (Bedfast) - Unable to balance while sitting on edge of bed Level 1 (Bedfast) - Unable to balance while sitting on edge of bed Level 1 (Bedfast) - Unable to balance while sitting on edge of bed   Is the above level different from baseline mobility prior to current illness? No - Consider discontinuing PT/OT -- No - Consider discontinuing PT/OT No - Consider discontinuing PT/OT No - Consider discontinuing PT/OT    Row Name 04/12/24 2000            Does the patient have exclusion criteria? Yes- Hold (Level 0) - Assessment complete          Barriers to discharge: none Disposition Plan:  anticipate in hospital death  HH orders placed: TBD Status is: Inpt  Objective: Blood pressure 103/68, pulse (!) 107, temperature (!) 97.1 F (36.2 C), temperature source Axillary, resp. rate 20, height 5' 2 (1.575 m), weight 95 kg, SpO2 100%.  Examination:  Physical Exam Constitutional:      Appearance: She is ill-appearing (chronic).     Comments: Opens eyes to voice but otherwise not responsive  HENT:     Head: Normocephalic and atraumatic.     Mouth/Throat:     Mouth: Mucous membranes are moist.  Eyes:     Extraocular Movements: Extraocular movements intact.  Cardiovascular:     Rate and Rhythm: Regular rhythm. Tachycardia present.  Pulmonary:     Effort: Pulmonary effort is normal. No respiratory distress.     Breath sounds: No wheezing.     Comments: Shallow breaths persist Abdominal:     General: Bowel sounds are normal. There is no distension.     Palpations: Abdomen is soft.     Tenderness: There is no abdominal tenderness.     Comments: PEG in place  Musculoskeletal:        General: Normal range of motion.     Cervical  back: Normal range of motion and neck supple.  Skin:    General: Skin is warm and dry.  Neurological:     Comments: Does not withdrawal to painful stimuli and not following commands      Consultants:    Procedures:    Data Reviewed: Results for orders placed or performed during the hospital encounter of 03/27/24 (from the past 24 hours)  Glucose, capillary     Status: Abnormal   Collection Time: 04/14/24  3:53 PM  Result Value Ref Range   Glucose-Capillary 216 (H) 70 - 99 mg/dL  Glucose, capillary     Status: Abnormal   Collection Time: 04/14/24  8:27 PM  Result Value Ref Range   Glucose-Capillary 217 (H) 70 - 99 mg/dL  Glucose, capillary     Status: Abnormal   Collection Time: 04/15/24  12:44 AM  Result Value Ref Range   Glucose-Capillary 227 (H) 70 - 99 mg/dL  CBC with Differential/Platelet     Status: Abnormal   Collection Time: 04/15/24  2:25 AM  Result Value Ref Range   WBC 6.3 4.0 - 10.5 K/uL   RBC 3.24 (L) 3.87 - 5.11 MIL/uL   Hemoglobin 8.6 (L) 12.0 - 15.0 g/dL   HCT 69.5 (L) 63.9 - 53.9 %   MCV 93.8 80.0 - 100.0 fL   MCH 26.5 26.0 - 34.0 pg   MCHC 28.3 (L) 30.0 - 36.0 g/dL   RDW 84.6 88.4 - 84.4 %   Platelets 240 150 - 400 K/uL   nRBC 3.5 (H) 0.0 - 0.2 %   Neutrophils Relative % 80 %   Neutro Abs 5.0 1.7 - 7.7 K/uL   Lymphocytes Relative 12 %   Lymphs Abs 0.8 0.7 - 4.0 K/uL   Monocytes Relative 7 %   Monocytes Absolute 0.4 0.1 - 1.0 K/uL   Eosinophils Relative 1 %   Eosinophils Absolute 0.1 0.0 - 0.5 K/uL   Basophils Relative 0 %   Basophils Absolute 0.0 0.0 - 0.1 K/uL   WBC Morphology See Note    Smear Review See Note    Polychromasia PRESENT   Magnesium      Status: None   Collection Time: 04/15/24  2:25 AM  Result Value Ref Range   Magnesium  2.1 1.7 - 2.4 mg/dL  Basic metabolic panel with GFR     Status: Abnormal   Collection Time: 04/15/24  2:25 AM  Result Value Ref Range   Sodium 147 (H) 135 - 145 mmol/L   Potassium 3.1 (L) 3.5 - 5.1 mmol/L   Chloride 111 98 - 111 mmol/L   CO2 28 22 - 32 mmol/L   Glucose, Bld 237 (H) 70 - 99 mg/dL   BUN 27 (H) 8 - 23 mg/dL   Creatinine, Ser 9.51 0.44 - 1.00 mg/dL   Calcium 8.4 (L) 8.9 - 10.3 mg/dL   GFR, Estimated >39 >39 mL/min   Anion gap 8 5 - 15  Glucose, capillary     Status: Abnormal   Collection Time: 04/15/24  4:27 AM  Result Value Ref Range   Glucose-Capillary 199 (H) 70 - 99 mg/dL  Glucose, capillary     Status: Abnormal   Collection Time: 04/15/24  8:24 AM  Result Value Ref Range   Glucose-Capillary 204 (H) 70 - 99 mg/dL  Glucose, capillary     Status: Abnormal   Collection Time: 04/15/24 10:49 AM  Result Value Ref Range   Glucose-Capillary 211 (H) 70 - 99 mg/dL    I  have reviewed  pertinent nursing notes, vitals, labs, and images as necessary. I have ordered labwork to follow up on as indicated.  I have reviewed the last notes from staff over past 24 hours. I have discussed patient's care plan and test results with nursing staff, CM/SW, and other staff as appropriate.  Time spent: Greater than 50% of the 55 minute visit was spent in counseling/coordination of care for the patient as laid out in the A&P.   LOS: 18 days   Alm Apo, MD [facility] 04/15/2024, 1:33 PM

## 2024-04-15 NOTE — Progress Notes (Signed)
 SLP Cancellation Note  Patient Details Name: Toni [name] MRN: [id number] DOB: 1933-03-31   Cancelled treatment:       Reason Eval/Treat Not Completed: Medical issues which prohibited therapy (on BiPAP). Ongoing GOC discussions with PMT planned for next date. SLP will f/u at that time.    Damien Blumenthal, M.A., CCC-SLP Speech Language Pathology, Acute Rehabilitation Services  Secure Chat preferred [phone]  04/15/2024, 7:34 AM

## 2024-04-15 NOTE — Plan of Care (Signed)
  Problem: Education: Goal: Ability to describe self-care measures that may prevent or decrease complications (Diabetes Survival Skills Education) will improve Outcome: Progressing   Problem: Coping: Goal: Ability to adjust to condition or change in health will improve Outcome: Progressing   Problem: Fluid Volume: Goal: Ability to maintain a balanced intake and output will improve Outcome: Progressing   Problem: Metabolic: Goal: Ability to maintain appropriate glucose levels will improve Outcome: Progressing   Problem: Nutritional: Goal: Progress toward achieving an optimal weight will improve Outcome: Progressing   Problem: Skin Integrity: Goal: Risk for impaired skin integrity will decrease Outcome: Progressing   Problem: Safety: Goal: Ability to remain free from injury will improve Outcome: Progressing   Problem: Pain Managment: Goal: General experience of comfort will improve and/or be controlled Outcome: Progressing   Problem: Elimination: Goal: Will not experience complications related to urinary retention Outcome: Progressing

## 2024-04-15 NOTE — Progress Notes (Signed)
Patient placed on bipap for the night

## 2024-04-16 DIAGNOSIS — Z789 Other specified health status: Secondary | ICD-10-CM

## 2024-04-16 DIAGNOSIS — R4589 Other symptoms and signs involving emotional state: Secondary | ICD-10-CM

## 2024-04-16 DIAGNOSIS — Z66 Do not resuscitate: Secondary | ICD-10-CM

## 2024-04-16 DIAGNOSIS — G9341 Metabolic encephalopathy: Secondary | ICD-10-CM | POA: Diagnosis not present

## 2024-04-16 DIAGNOSIS — J9601 Acute respiratory failure with hypoxia: Secondary | ICD-10-CM | POA: Diagnosis not present

## 2024-04-16 DIAGNOSIS — Z7189 Other specified counseling: Secondary | ICD-10-CM | POA: Diagnosis not present

## 2024-04-16 DIAGNOSIS — A419 Sepsis, unspecified organism: Secondary | ICD-10-CM | POA: Diagnosis not present

## 2024-04-16 DIAGNOSIS — Z515 Encounter for palliative care: Secondary | ICD-10-CM | POA: Diagnosis not present

## 2024-04-16 LAB — CBC WITH DIFFERENTIAL/PLATELET
Abs Immature Granulocytes: 0.08 K/uL — ABNORMAL HIGH (ref 0.00–0.07)
Basophils Absolute: 0 K/uL (ref 0.0–0.1)
Basophils Relative: 1 %
Eosinophils Absolute: 0 K/uL (ref 0.0–0.5)
Eosinophils Relative: 0 %
HCT: 30.7 % — ABNORMAL LOW (ref 36.0–46.0)
Hemoglobin: 8.8 g/dL — ABNORMAL LOW (ref 12.0–15.0)
Immature Granulocytes: 1 %
Lymphocytes Relative: 19 %
Lymphs Abs: 1.4 K/uL (ref 0.7–4.0)
MCH: 27 pg (ref 26.0–34.0)
MCHC: 28.7 g/dL — ABNORMAL LOW (ref 30.0–36.0)
MCV: 94.2 fL (ref 80.0–100.0)
Monocytes Absolute: 0.6 K/uL (ref 0.1–1.0)
Monocytes Relative: 8 %
Neutro Abs: 5.4 K/uL (ref 1.7–7.7)
Neutrophils Relative %: 71 %
Platelets: 225 K/uL (ref 150–400)
RBC: 3.26 MIL/uL — ABNORMAL LOW (ref 3.87–5.11)
RDW: 15.2 % (ref 11.5–15.5)
Smear Review: NORMAL
WBC: 7.5 K/uL (ref 4.0–10.5)
nRBC: 1.9 % — ABNORMAL HIGH (ref 0.0–0.2)

## 2024-04-16 LAB — BASIC METABOLIC PANEL WITH GFR
Anion gap: 6 (ref 5–15)
BUN: 31 mg/dL — ABNORMAL HIGH (ref 8–23)
CO2: 27 mmol/L (ref 22–32)
Calcium: 8.1 mg/dL — ABNORMAL LOW (ref 8.9–10.3)
Chloride: 112 mmol/L — ABNORMAL HIGH (ref 98–111)
Creatinine, Ser: 0.53 mg/dL (ref 0.44–1.00)
GFR, Estimated: >60 mL/min (ref >60)
Glucose, Bld: 260 mg/dL — ABNORMAL HIGH (ref 70–99)
Potassium: 3.7 mmol/L (ref 3.5–5.1)
Sodium: 145 mmol/L (ref 135–145)

## 2024-04-16 LAB — GLUCOSE, CAPILLARY
Glucose-Capillary: 195 mg/dL — ABNORMAL HIGH (ref 70–99)
Glucose-Capillary: 205 mg/dL — ABNORMAL HIGH (ref 70–99)
Glucose-Capillary: 216 mg/dL — ABNORMAL HIGH (ref 70–99)
Glucose-Capillary: 218 mg/dL — ABNORMAL HIGH (ref 70–99)
Glucose-Capillary: 232 mg/dL — ABNORMAL HIGH (ref 70–99)
Glucose-Capillary: 253 mg/dL — ABNORMAL HIGH (ref 70–99)
Glucose-Capillary: 269 mg/dL — ABNORMAL HIGH (ref 70–99)

## 2024-04-16 LAB — MAGNESIUM: Magnesium: 1.9 mg/dL (ref 1.7–2.4)

## 2024-04-16 NOTE — Progress Notes (Signed)
 Progress Note    Toni [name]   FMW:[id number]  DOB: [birth year]  DOA: 03/27/2024     19 PCP: Patient, No Pcp Per  Initial CC: weakness, lethargy, N/V  Hospital Course: 89 y.o. female with past medical history of chronic HFpEF, diabetes type 2, peripheral artery disease, cirrhosis, and HTN who presented to the ED on 03/27/2024 with nausea/vomiting, lethargy, and weakness. She is typically alert but was progressively confused over the course of the day as well.  All history is obtained from chart 2/2 pt's acute encephalopathy.  She is admitted with sepsis secondary to UTI and colitis, AKI, lactic acidosis, metabolic acidosis, acute metabolic encephalopathy, and new onset A-fib. Pt reportedly has frequent uti's in past. UA pending. Lactate elevated on presentation but improving to 3 with volume. Ultimately, she required initiation of norepi to maintain BP. Found to be in aki with Cr 1.5 (baseline 0.7) with some hydronephrosis 2/2 R staghorn stone. There is also stigmata of espophagitis and colitis.   Assessment and Plan:  GOC FTT Acute metabolic encephalopathy  - worsened in general 9/25; shallow breaths and much more fatigued appearing along with severely worsened mentation; not following commands. Found to have vomit on her chest  - Hyperammonemia; lactulose  enemas started but minimal change in mentation with some downtrend however suspect larger picture clinically is that she is actively approaching end-of-life - still minimally responsive this morning; opens eyes and tracks some but not following any commands; some grimace to pain - Palliative care has met with daughter and tentative plan is watchful waiting over the weekend - minimal change as of 9/29, no improvement aside from off bipap for now - could consider MRI brain to rule out CVA but management would not change and prognosis already poor; I still favor focusing on comfort and discontinuing unnecessary medical  interventions  Sepsis 2/2 UTI- likely pyelonephritis and colitis; shock resolved E. Coli & Proteus mirabilis UTI -E. coli resistant to ampicillin  and Proteus resistant to Macrodantin, has been de-escalated to ceftriaxone  based on susceptibilities -Continue midodrine  - Was found to have staghorn calculus in the right kidney without any obstructive processes although did have mild right hydronephrosis and dilatation of the proximal right ureter on ultrasound and CT imaging   Chronic HFpEF  -ECHO  EF 70-75%, hyperdynamic, severe MR calcifications with associated murmur -Currently no acute issues, follow closely for any volume overload - Abdominal x-ray from 04/10/2024 shows gaseous distention of bowel and no visible free air   Acute hypokalemia - Repleted   Atrial fibrillation, new-onset -Now in NSR, likely due to septic shock  - CHA2DS2-VASc 2: 6-if A-fib returns, will need GOC regarding anticoagulation   Cirrhosis/Hyperammonemia -Noted on CT abd/pelvis -Continue lactulose    Normocytic anemia H&H stable   Constipation -Found to have large stool ball in the rectum with mild rectal wall thickening, stercoral colitis -Now having BM   Esophagitis, chronic  CT revealed marked wall thickening of the distal esophagus, esophagitis.  Infiltrative mass has not been excluded - No GI bleed.  Palliative medicine following for GOC - Per palliative medicine, family wishes to continue supportive care while hoping for improvement. Given advanced age and multiple medical problems not a candidate for aggressive treatment otherwise -Continue PPI   Acute kidney injury with mild right hydronephrosis, improving  -Seen by urology, Dr. Renda on 9/10, recommended outpatient follow-up once patient is optimized for consideration of PCNL.   - Cr now normalized   Diabetes mellitus type 2 - continue lantus  and  SSI   Moderate protein energy malnutrition -Continue tube feeds, SLP 9/15-> full liquid  diet -Speech therapy following, advanced to dys 3 diet -Dietitian following. Goal is/was to eventually wean off TF and remove coretrak - poor prognosis with decline; not likely to maintain oral nutrition anytime soon; either comfort care/pleasure feeding or PEG (would not recommend the latter)   Obesity class II Estimated body mass index is 38.39 kg/m as calculated from the following:   Height as of this encounter: 5' 2 (1.575 m).   Weight as of this encounter: 95.2 kg  Interval History:  No events overnight but still minimally responsive.  Opens eyes and tracks some but still not following any commands. Hopefully further conversations can occur between palliative care and daughter today.  Old records reviewed in assessment of this patient  Antimicrobials:   DVT prophylaxis:  heparin  injection 5,000 Units Start: 03/28/24 0600 SCDs Start: 03/28/24 0230   Code Status:   Code Status: Limited: Do not attempt resuscitation (DNR) -DNR-LIMITED -Do Not Intubate/DNI   Mobility Assessment (Last 72 Hours)     Mobility Assessment     Row Name 04/16/24 0800 04/15/24 2000 04/15/24 0837 04/14/24 2015 04/14/24 0900   Does the patient have exclusion criteria? No - Perform mobility assessment No - Perform mobility assessment Yes- Hold (Level 0) - Assessment complete Yes - Bedfast (Level 1) - Select exclusion criteria in next row Yes- Hold (Level 0) - Assessment complete   Mobility Assessment Exclusion Criteria Acute decline in neurological status Acute decline in neurological status -- MS/PC:  Medically unstable;Acute decline in neurological status Acute decline in neurological status   What is the highest level of mobility based on the mobility assessment? Level 1 (Bedfast) - Unable to balance while sitting on edge of bed Level 1 (Bedfast) - Unable to balance while sitting on edge of bed Level 1 (Bedfast) - Unable to balance while sitting on edge of bed -- Level 1 (Bedfast) - Unable to balance  while sitting on edge of bed   Is the above level different from baseline mobility prior to current illness? No - Consider discontinuing PT/OT No - Consider discontinuing PT/OT No - Consider discontinuing PT/OT -- No - Consider discontinuing PT/OT    Row Name 04/13/24 2030           Does the patient have exclusion criteria? Yes- Hold (Level 0) - Assessment complete       Mobility Assessment Exclusion Criteria Acute decline in neurological status       What is the highest level of mobility based on the mobility assessment? Level 1 (Bedfast) - Unable to balance while sitting on edge of bed       Is the above level different from baseline mobility prior to current illness? No - Consider discontinuing PT/OT          Barriers to discharge: none Disposition Plan:  anticipate in hospital death  HH orders placed: TBD Status is: Inpt  Objective: Blood pressure 110/67, pulse 98, temperature 98.3 F (36.8 C), temperature source Oral, resp. rate (!) 42, height 5' 2 (1.575 m), weight 95 kg, SpO2 100%.  Examination:  Physical Exam Constitutional:      Appearance: She is ill-appearing (chronic).     Comments: Opens eyes to voice but otherwise not responsive  HENT:     Head: Normocephalic and atraumatic.     Mouth/Throat:     Mouth: Mucous membranes are moist.  Eyes:     Extraocular Movements: Extraocular  movements intact.  Cardiovascular:     Rate and Rhythm: Normal rate and regular rhythm.  Pulmonary:     Effort: Pulmonary effort is normal. No respiratory distress.     Breath sounds: No wheezing.     Comments: Shallow breaths persist Abdominal:     General: Bowel sounds are normal. There is no distension.     Palpations: Abdomen is soft.     Tenderness: There is no abdominal tenderness.     Comments: PEG in place  Musculoskeletal:        General: Normal range of motion.     Cervical back: Normal range of motion and neck supple.  Skin:    General: Skin is warm and dry.   Neurological:     Comments: Does not withdrawal to painful stimuli and not following commands      Consultants:    Procedures:    Data Reviewed: Results for orders placed or performed during the hospital encounter of 03/27/24 (from the past 24 hours)  Glucose, capillary     Status: Abnormal   Collection Time: 04/15/24  3:42 PM  Result Value Ref Range   Glucose-Capillary 170 (H) 70 - 99 mg/dL  Glucose, capillary     Status: Abnormal   Collection Time: 04/15/24  8:06 PM  Result Value Ref Range   Glucose-Capillary 192 (H) 70 - 99 mg/dL  Glucose, capillary     Status: Abnormal   Collection Time: 04/16/24 12:08 AM  Result Value Ref Range   Glucose-Capillary 218 (H) 70 - 99 mg/dL  CBC with Differential/Platelet     Status: Abnormal   Collection Time: 04/16/24  1:09 AM  Result Value Ref Range   WBC 7.5 4.0 - 10.5 K/uL   RBC 3.26 (L) 3.87 - 5.11 MIL/uL   Hemoglobin 8.8 (L) 12.0 - 15.0 g/dL   HCT 69.2 (L) 63.9 - 53.9 %   MCV 94.2 80.0 - 100.0 fL   MCH 27.0 26.0 - 34.0 pg   MCHC 28.7 (L) 30.0 - 36.0 g/dL   RDW 84.7 88.4 - 84.4 %   Platelets 225 150 - 400 K/uL   nRBC 1.9 (H) 0.0 - 0.2 %   Neutrophils Relative % 71 %   Neutro Abs 5.4 1.7 - 7.7 K/uL   Lymphocytes Relative 19 %   Lymphs Abs 1.4 0.7 - 4.0 K/uL   Monocytes Relative 8 %   Monocytes Absolute 0.6 0.1 - 1.0 K/uL   Eosinophils Relative 0 %   Eosinophils Absolute 0.0 0.0 - 0.5 K/uL   Basophils Relative 1 %   Basophils Absolute 0.0 0.0 - 0.1 K/uL   WBC Morphology See Note    RBC Morphology MORPHOLOGY UNREMARKABLE    Smear Review Normal platelet morphology    Immature Granulocytes 1 %   Abs Immature Granulocytes 0.08 (H) 0.00 - 0.07 K/uL  Magnesium      Status: None   Collection Time: 04/16/24  1:09 AM  Result Value Ref Range   Magnesium  1.9 1.7 - 2.4 mg/dL  Basic metabolic panel with GFR     Status: Abnormal   Collection Time: 04/16/24  1:09 AM  Result Value Ref Range   Sodium 145 135 - 145 mmol/L   Potassium  3.7 3.5 - 5.1 mmol/L   Chloride 112 (H) 98 - 111 mmol/L   CO2 27 22 - 32 mmol/L   Glucose, Bld 260 (H) 70 - 99 mg/dL   BUN 31 (H) 8 - 23 mg/dL   Creatinine, Ser  0.53 0.44 - 1.00 mg/dL   Calcium 8.1 (L) 8.9 - 10.3 mg/dL   GFR, Estimated >39 >39 mL/min   Anion gap 6 5 - 15  Glucose, capillary     Status: Abnormal   Collection Time: 04/16/24  3:55 AM  Result Value Ref Range   Glucose-Capillary 195 (H) 70 - 99 mg/dL  Glucose, capillary     Status: Abnormal   Collection Time: 04/16/24  7:47 AM  Result Value Ref Range   Glucose-Capillary 232 (H) 70 - 99 mg/dL  Glucose, capillary     Status: Abnormal   Collection Time: 04/16/24 10:58 AM  Result Value Ref Range   Glucose-Capillary 216 (H) 70 - 99 mg/dL    I have reviewed pertinent nursing notes, vitals, labs, and images as necessary. I have ordered labwork to follow up on as indicated.  I have reviewed the last notes from staff over past 24 hours. I have discussed patient's care plan and test results with nursing staff, CM/SW, and other staff as appropriate.  Time spent: Greater than 50% of the 55 minute visit was spent in counseling/coordination of care for the patient as laid out in the A&P.   LOS: 19 days   Alm Apo, MD [facility] 04/16/2024, 11:44 AM

## 2024-04-16 NOTE — Progress Notes (Signed)
 Speech Language Pathology Treatment: Dysphagia  Patient Details Name: Toni [name] MRN: [id number] DOB: [birth year] Today's Date: 04/16/2024 Time: [id number] SLP Time Calculation (min) (ACUTE ONLY): 9 min  Assessment / Plan / Recommendation Clinical Impression  Pt did not rouse to loud verbal stimuli or throughout attempts to provide oral care and POs. She is tachypneic with RR ranging from 30-44. Discussed with RN, who reports current presentation is consistent with presentation over the weekend. She has not roused to eat/drink and is receiving nutrition via Cortrak. Given mentation and varying O2 requirements, recommend she be made NPO. Note plan to meet with PMT on Wednesday. SLP will f/u pending GOC.    HPI HPI: Toni [name] is an 89 yo female with PMH including HFpEF, T2DM, HTN, obesity, and cirrhosis presenting to ED 9/9 with coffee ground emesis, hypotension, and weakness. Admitted with sepsis 2/2 UTI and AKI with mild R hydronephrosis.      SLP Plan  Continue with current plan of care          Recommendations  Diet recommendations: NPO Medication Administration: Via alternative means                  Oral care QID   Frequent or constant Supervision/Assistance Dysphagia, unspecified (R13.10)     Continue with current plan of care     Damien Blumenthal, M.A., CCC-SLP Speech Language Pathology, Acute Rehabilitation Services  Secure Chat preferred [phone]   04/16/2024, 4:35 PM

## 2024-04-16 NOTE — TOC Progression Note (Addendum)
 Transition of Care [facility]) - Progression Note    Patient Details  Name: [name] MRN: [id number] Date of Birth: [birth year]  Transition of Care [facility]) CM/SW Contact  Luise [name], [place] Phone Number: 04/16/2024, 8:51 AM  Clinical Narrative:  Patient still has cortrak which is a barrier to SNF placement. Per Palliative, awaiting GOC meeting with family today 9/29.   4:19 PM Per Palliative, family asked for time to see if patient improves. CSW will continue to follow.   CSW will continue to follow.    Expected Discharge Plan: Skilled Nursing Facility Barriers to Discharge: Continued Medical Work up               Expected Discharge Plan and Services In-house Referral: Clinical Social Work     Living arrangements for the past 2 months: Single Family Home                                       Social Drivers of Health (SDOH) Interventions SDOH Screenings   Food Insecurity: Patient Unable To Answer (03/30/2024)  Housing: Patient Unable To Answer (03/30/2024)  Transportation Needs: Patient Unable To Answer (03/30/2024)  Utilities: Patient Unable To Answer (03/30/2024)  Social Connections: Patient Unable To Answer (03/30/2024)  Tobacco Use: Low Risk  (03/27/2024)    Readmission Risk Interventions     No data to display

## 2024-04-16 NOTE — Plan of Care (Signed)
  Problem: Nutrition: Goal: Adequate nutrition will be maintained Outcome: Progressing   Problem: Elimination: Goal: Will not experience complications related to bowel motility Outcome: Progressing Goal: Will not experience complications related to urinary retention Outcome: Progressing   Problem: Safety: Goal: Ability to remain free from injury will improve Outcome: Progressing   Problem: Skin Integrity: Goal: Risk for impaired skin integrity will decrease Outcome: Progressing   Problem: Nutritional: Goal: Maintenance of adequate nutrition will improve Outcome: Not Progressing Goal: Progress toward achieving an optimal weight will improve Outcome: Not Progressing   Problem: Activity: Goal: Risk for activity intolerance will decrease Outcome: Not Progressing   Problem: Coping: Goal: Level of anxiety will decrease Outcome: Not Progressing   Problem: Pain Managment: Goal: General experience of comfort will improve and/or be controlled Outcome: Not Progressing

## 2024-04-16 NOTE — Progress Notes (Signed)
 Daily Progress Note   Date: 04/16/2024   Patient Name: Toni [name]  DOB: 10-20-1932  MRN: [id number]  Age / Sex: 89 y.o., female  Attending Physician: Patsy Lenis, MD Primary Care Physician: Patient, No Pcp Per Admit Date: 03/27/2024 Length of Stay: 19 days  Reason for Follow-up: Establishing goals of care  Past Medical History:  Diagnosis Date   Diabetes mellitus without complication (HCC)    Hypertension    Peripheral vascular disease    Plantar fascial fibromatosis     Subjective:   Subjective: Chart Reviewed. Updates received. Patient Assessed. Created space and opportunity for patient  and family to explore thoughts and feelings regarding current medical situation.  Today's Discussion: Today before meeting with the patient/family, I reviewed the chart notes including previous palliative note from 2 days ago, SLP note from yesterday, nursing note from yesterday, internal medicine note from yesterday, RT note from yesterday, nursing note from today, TOC note from today, hospitalist note from today. I also reviewed vital signs, nursing flowsheets, medication administrations record, labs, and imaging. Labs reviewed include CBC showing continued normalization of white count at 7.5 in the setting of sepsis/UTI, stable hemoglobin of 8.8 in the setting of anemia.  BMP shows normalized potassium at 3.7 with acute hyperkalemia after replete meant.  Today prior to seeing the patient I spoke with the nurse.  She expresses concerns about her mental status and continued lack of awareness.  Today saw the patient at bedside, no family is present.  Previous notes indicate the patient would open her eyes, track around the room but not follow commands.  Today I tried with significant voice and touch, mildly shaking the patient tried to get her to open her eyes to her name.  She would not open her eyes, follow commands.  She seems quite out of it and not sure if this is just a temporary decline  from deep sleep versus a further overall decline.  Today after seeing the patient, I called her daughter [name] to discuss.  She provided significant history of the events of her hospitalization and leading up to today.  We discussed the conversations over the weekend where and she requested time over the weekend to see if she would have any significant improvement.  I discussed that it does not appear that she has had significant improvement, other than coming off the BiPAP, and may potentially be further declined with her mental status at my visit.  [name] expressed understanding of the overall poor prognosis.  However, she states that she first came in and had significant vomiting her mom bounced back after 5 days.  After significant discussion on her health, changes, overall prognosis, medical recommendations for transition to comfort care given likely approaching end-of-life [name] has requested time until Wednesday (2 additional days) for any improvement.   I shared that I would continue to see her mom daily and update her on findings.  I encouraged her to call with any questions or concerns and confirmed that she has a palliative medicine contact number.  I also shared that if I saw that the patient was starting to significantly suffer or have a substantial decline, I would update her information.  She expressed understanding and appreciation.  I provided emotional and general support through therapeutic listening, empathy, sharing of stories, and other techniques. I answered all questions and addressed all concerns to the best of my ability.  After seeing the patient and discussing with her daughter I debriefed with the medical  team and nursing team discussed conversation and plans.  Review of Systems  Unable to perform ROS   Objective:   Primary Diagnoses: Present on Admission:  (Resolved) Septic shock (HCC)  Acute metabolic encephalopathy   Vital Signs:  BP 106/63 (BP Location:  Left Arm)   Pulse (!) 102   Temp 98.7 F (37.1 C) (Oral)   Resp (!) 28   Ht 5' 2 (1.575 m)   Wt 95 kg   SpO2 100%   BMI 38.31 kg/m   Physical Exam Vitals and nursing note reviewed.  Constitutional:      General: She is sleeping. She is not in acute distress. HENT:     Head: Normocephalic and atraumatic.  Cardiovascular:     Rate and Rhythm: Normal rate.  Pulmonary:     Effort: Pulmonary effort is normal. No respiratory distress.     Comments: RR 22 Abdominal:     General: Abdomen is flat. There is no distension.     Palpations: Abdomen is soft.  Skin:    General: Skin is warm and dry.  Neurological:     Mental Status: She is unresponsive.     Palliative Assessment/Data: 10%   Existing Vynca/ACP Documentation: None  Assessment & Plan:   HPI/Patient Profile:  89 y.o. female  with past medical history of chronic HFpEF, diabetes type 2, peripheral artery disease, cirrhosis, and HTN who presented to the ED on 03/27/2024 with nausea/vomiting, lethargy, and weakness.  She is admitted with sepsis secondary to UTI and colitis, AKI, lactic acidosis, metabolic acidosis, acute metabolic encephalopathy, and new onset A-fib.   Palliative Medicine has been consulted for goals of care discussions and complex medical decision making.   SUMMARY OF RECOMMENDATIONS   DNR-Limited Continue current scope of treatment Time for outcomes, daughter requesting through Wednesday Palliative medicine will continue to follow and update family Low threshold for transition to comfort care if patient does not improve or continues to decline by Wednesday Please notify us  of any significant change or urgent palliative needs  Symptom Management:  Per primary team Palliative medicine is available to assist as needed  Code Status: DNR - Limited (DNR/DNI)  Prognosis: Unable to determine: Pending family decisions  Discharge Planning: To Be Determined  Discussed with: Patient's family, medical  team, nursing team  Thank you for allowing us  to participate in the care of Toni [name] PMT will continue to support holistically.  Billing based on MDM: High  Problems Addressed: One acute or chronic illness or injury that poses a threat to life or bodily function  Amount and/or Complexity of Data: Category 1:Review of prior external note(s) from each unique source, Review of the result(s) of each unique test, and Assessment requiring an independent historian(s) and Category 3:Discussion of management or test interpretation with external physician/other qualified health care professional/appropriate source (not separately reported)  Risks: N/A  Detailed review of medical records (labs, imaging, vital signs), medically appropriate exam, discussed with treatment team, counseling and education to patient, family, & staff, documenting clinical information, medication management, coordination of care  Camellia Kays, NP Palliative Medicine Team  Team Phone # [phone] (Nights/Weekends)  03/17/2021, 8:17 AM

## 2024-04-17 DIAGNOSIS — R6521 Severe sepsis with septic shock: Secondary | ICD-10-CM | POA: Diagnosis not present

## 2024-04-17 DIAGNOSIS — A419 Sepsis, unspecified organism: Secondary | ICD-10-CM | POA: Diagnosis not present

## 2024-04-17 DIAGNOSIS — Z7189 Other specified counseling: Secondary | ICD-10-CM | POA: Diagnosis not present

## 2024-04-17 DIAGNOSIS — G9341 Metabolic encephalopathy: Secondary | ICD-10-CM | POA: Diagnosis not present

## 2024-04-17 LAB — CBC WITH DIFFERENTIAL/PLATELET
Abs Immature Granulocytes: 0.25 K/uL — ABNORMAL HIGH (ref 0.00–0.07)
Basophils Absolute: 0.1 K/uL (ref 0.0–0.1)
Basophils Relative: 1 %
Eosinophils Absolute: 0 K/uL (ref 0.0–0.5)
Eosinophils Relative: 0 %
HCT: 32.6 % — ABNORMAL LOW (ref 36.0–46.0)
Hemoglobin: 9 g/dL — ABNORMAL LOW (ref 12.0–15.0)
Immature Granulocytes: 2 %
Lymphocytes Relative: 11 %
Lymphs Abs: 1.6 K/uL (ref 0.7–4.0)
MCH: 26.7 pg (ref 26.0–34.0)
MCHC: 27.6 g/dL — ABNORMAL LOW (ref 30.0–36.0)
MCV: 96.7 fL (ref 80.0–100.0)
Monocytes Absolute: 0.8 K/uL (ref 0.1–1.0)
Monocytes Relative: 5 %
Neutro Abs: 12.6 K/uL — ABNORMAL HIGH (ref 1.7–7.7)
Neutrophils Relative %: 81 %
Platelets: 266 K/uL (ref 150–400)
RBC: 3.37 MIL/uL — ABNORMAL LOW (ref 3.87–5.11)
RDW: 15.3 % (ref 11.5–15.5)
Smear Review: NORMAL
WBC: 15.3 K/uL — ABNORMAL HIGH (ref 4.0–10.5)
nRBC: 1.9 % — ABNORMAL HIGH (ref 0.0–0.2)

## 2024-04-17 LAB — MAGNESIUM: Magnesium: 2.3 mg/dL (ref 1.7–2.4)

## 2024-04-17 LAB — BASIC METABOLIC PANEL WITH GFR
Anion gap: 10 (ref 5–15)
BUN: 37 mg/dL — ABNORMAL HIGH (ref 8–23)
CO2: 30 mmol/L (ref 22–32)
Calcium: 8.6 mg/dL — ABNORMAL LOW (ref 8.9–10.3)
Chloride: 109 mmol/L (ref 98–111)
Creatinine, Ser: 0.62 mg/dL (ref 0.44–1.00)
GFR, Estimated: >60 mL/min (ref >60)
Glucose, Bld: 206 mg/dL — ABNORMAL HIGH (ref 70–99)
Potassium: 3.9 mmol/L (ref 3.5–5.1)
Sodium: 149 mmol/L — ABNORMAL HIGH (ref 135–145)

## 2024-04-17 LAB — GLUCOSE, CAPILLARY: Glucose-Capillary: 174 mg/dL — ABNORMAL HIGH (ref 70–99)

## 2024-04-17 MED ORDER — FREE WATER: 200.0000 mL | Status: DC

## 2024-04-18 NOTE — Progress Notes (Signed)
 [id number] Sous into patient's room and got bedside report from night shift nurse, Vertell. Patient was breathing and had a heart rate and rhythm of 100, RR 34, O2 98, and a B/P 89/52 (63). I verified that patient had a pulse by palpation.

## 2024-04-18 NOTE — Plan of Care (Signed)
     Referral previously received for Toni [name] for goals of care discussion. Noted most recent palliative in-person assessment dated 04/16/2024 at which time it was recommended to follow up tomorrow for ongoing discussions but family hoping to wait until Wednesday to make decisions.  Today, prior to being able to see the patient I was notified by the bedside nurse that the patient had passed away.  Family is being notified.  No plan for in person follow-up at this time.  Thank you for your referral and allowing PMT to assist in Toni [name]'s care.   Camellia Kays, NP Palliative Medicine Team Phone: [phone]  NO CHARGE

## 2024-04-18 NOTE — Discharge Summary (Signed)
 Death Summary  Toni [name] FMW:[id number] DOB: 03-07-1933 DOA: March 31, 2024  PCP: Patient, No Pcp Per  Admit date: Mar 31, 2024 Date of Death: 2024-04-21 Time of Death: 0821 Notification: Family notified of death   History of present illness:  89 y.o. female with past medical history of chronic HFpEF, diabetes type 2, peripheral artery disease, cirrhosis, and HTN who presented to the ED on 03-31-2024 with nausea/vomiting, lethargy, and weakness. She is typically alert but was progressively confused over the course of the day as well.  All history is obtained from chart 2/2 pt's acute encephalopathy.  She is admitted with sepsis secondary to UTI and colitis, AKI, lactic acidosis, metabolic acidosis, acute metabolic encephalopathy, and new onset A-fib. She was treated in the ICU for septic shock from E. coli and Proteus UTI.  Antibiotic courses were completed.  She had some initial improvement after transfer out of the ICU however had ongoing difficulty with nutrition and maintaining adequate oral intake.  She was cortrak dependent. She had progressive decline and developed further issues with respiratory distress requiring BiPAP.  Hyperammonemia requiring lactulose .  Ongoing failure to thrive and decreased responsiveness despite maximal medical efforts.  Palliative care was consulted and help assist with GOC discussions. Ongoing medical care continued but patient continued to further decline. Ultimately she became bradycardic and more apneic and passed naturally on 2024/04/21.  Final Diagnoses:  Failure to thrive in adult Septic shock (E. coli and Proteus UTI/pyelonephritis, colitis) Acute hypercarbic respiratory failure Acute metabolic and hepatic encephalopathy  Active Hospital Problems   Diagnosis Date Noted   Acute metabolic encephalopathy 09/02/2016    Priority: 1.   Goals of care, counseling/discussion     Priority: 3.   Acute respiratory failure with hypoxia (HCC) 04/12/2024   Pressure  injury of skin 04/11/2024   Malnutrition of moderate degree 03/28/2024    Resolved Hospital Problems   Diagnosis Date Noted Date Resolved   Septic shock [facility]) 03/28/2024 04/11/2024    Priority: 1.   Acute pyelonephritis 04/11/2024 04/11/2024    Priority: 2.    The results of significant diagnostics from this hospitalization (including imaging, microbiology, ancillary and laboratory) are listed below for reference.    Significant Diagnostic Studies: DG CHEST PORT 1 VIEW Result Date: 04/12/2024 CLINICAL DATA:  Shortness of breath. EXAM: PORTABLE CHEST 1 VIEW COMPARISON:  04/10/2024. FINDINGS: Low lung volume. Redemonstration of curvilinear opacities at the right lung base. Differential diagnosis includes pneumoperitoneum versus atelectasis at the right lung base. Please correlate clinically to determine the need for additional imaging with CT scan abdomen and pelvis. Note is made of elevated right hemidiaphragm. Bilateral lung fields are otherwise clear. Bilateral costophrenic angles are clear. Stable cardio-mediastinal silhouette. No acute osseous abnormalities. The soft tissues are within normal limits. Enteric tube is seen with its tip coursing below the left hemidiaphragm. IMPRESSION: *Redemonstration of curvilinear opacities at the right lung base. Differential diagnosis includes pneumoperitoneum versus atelectasis at the right lung base. Please correlate clinically to determine the need for additional imaging with CT scan abdomen and pelvis. Electronically Signed   By: Ree Molt M.D.   On: 04/12/2024 10:21   DG Abd Decub Result Date: 04/10/2024 CLINICAL DATA:  Pneumoperitoneum EXAM: ABDOMEN - 1 VIEW DECUBITUS COMPARISON:  Chest x-ray earlier today. FINDINGS: Gaseous distention of bowel noted.  No visible free air. IMPRESSION: No visible free air. Electronically Signed   By: Franky Crease M.D.   On: 04/10/2024 20:34   DG CHEST PORT 1 VIEW Result Date: 04/10/2024 CLINICAL  DATA:  Shortness  of breath. EXAM: PORTABLE CHEST 1 VIEW COMPARISON:  Chest radiographs 04/06/2024, 03/28/2024 and 03/31/2011. Abdominal radiographs 03/29/2024 and 03/28/2024. Abdominopelvic CT 03/27/2024. FINDINGS: 1058 hours. Persistent low lung volumes. The heart size and mediastinal contours are stable. Feeding tube projects below the diaphragm, tip not visualized. There is increased linear atelectasis at the right lung base. Lucencies projecting over both hemidiaphragms have mildly increased compared with the most recent radiographs of 4 days ago and are favored to reflect distended bowel based on prior imaging. Pneumoperitoneum would be difficult to exclude based on this examination. There is no pneumothorax or significant pleural effusion. No acute osseous findings are seen. IMPRESSION: 1. Increased linear atelectasis at the right lung base. 2. Lucencies projecting over both hemidiaphragms are favored to reflect distended bowel based on prior imaging. Cannot exclude pneumoperitoneum. Recommend further evaluation with left lateral decubitus abdominal radiographs or follow-up abdominopelvic CT. 3. These results will be called to the ordering clinician or representative by the Radiologist Assistant, and communication documented in the PACS or [employer]. Electronically Signed   By: Elsie Perone M.D.   On: 04/10/2024 15:16   DG CHEST PORT 1 VIEW Result Date: 04/06/2024 EXAM: 1 VIEW XRAY OF THE CHEST 04/06/2024 07:32:00 AM COMPARISON: 03/28/2024 CLINICAL HISTORY: Aspiration into airway. Code sepsis. FINDINGS: LINES, TUBES AND DEVICES: Feeding tube in place with tip in distal stomach. LUNGS AND PLEURA: Low lung volumes accentuate pulmonary vascularity. Streaky atelectasis at right lung base. HEART AND MEDIASTINUM: Stable aortic atherosclerosis and tortuosity with mitral annulus calcification. BONES AND SOFT TISSUES: Thoracic spondylosis. Bilateral shoulder arthropathy. Pneumoperitoneum. IMPRESSION: 1. Feeding tube in  place with tip in distal stomach. 2. Low lung volumes with streaky atelectasis at right lung base. Electronically signed by: Waddell Calk MD 04/06/2024 12:52 PM EDT RP Workstation: [id number]   DG Abd 1 View Result Date: 03/29/2024 CLINICAL DATA:  Abdominal distention. EXAM: ABDOMEN - 1 VIEW COMPARISON:  03/28/2024. FINDINGS: Similar persistent air distention of the colon with dilated sigmoid colon loop and moderate-to-large retained stool at the rectum. Enteric tube tip lies to the right of midline, likely post pyloric, with decreased gaseous distention of the stomach. IMPRESSION: 1. Similar persistent air distention of the colon with dilated sigmoid colon loop and moderate-to-large retained stool at the rectum. 2. Enteric tube tip lies to the right of midline, likely post pyloric, with decreased gaseous distention of the stomach. Electronically Signed   By: Harrietta Sherry M.D.   On: 03/29/2024 11:08   ECHOCARDIOGRAM COMPLETE Result Date: 03/28/2024    ECHOCARDIOGRAM REPORT   Patient Name:   KALESE ENSZ Date of Exam: 03/28/2024 Medical Rec #:  [id number]       Height:       62.0 in Accession #:    [id number]      Weight:       197.5 lb Date of Birth:  [birth year]        BSA:          1.902 m Patient Age:    89 years        BP:           96/59 mmHg Patient Gender: F               HR:           80 bpm. Exam Location:  Inpatient Procedure: 2D Echo and Intracardiac Opacification Agent (Both Spectral and Color            Flow  Doppler were utilized during procedure). Indications:    shock  History:        Patient has prior history of Echocardiogram examinations, most                 recent 03/26/2022. Risk Factors:Diabetes and Hypertension.  Sonographer:    Tinnie Barefoot RDCS Referring Phys: [id number] LEITA JONELLE EINSTEIN  Sonographer Comments: Patient is obese. Image acquisition challenging due to respiratory motion. IMPRESSIONS  1. Left ventricular ejection fraction, by estimation, is 70 to 75%. Left ventricular  ejection fraction by PLAX is 75 %. The left ventricle has hyperdynamic function. The left ventricle has no regional wall motion abnormalities. There is mild left ventricular hypertrophy. Left ventricular diastolic function could not be evaluated.  2. Right ventricular systolic function is normal. The right ventricular size is normal.  3. The mitral valve is degenerative. Trivial mitral valve regurgitation. No evidence of mitral stenosis. Severe mitral annular calcification.  4. The aortic valve is tricuspid. Aortic valve regurgitation is trivial. Aortic valve sclerosis/calcification is present, without any evidence of aortic stenosis. Comparison(s): Changes from prior study are noted. 03/26/2022: LVEF 65-70%, severe MAC, mild MS. FINDINGS  Left Ventricle: Left ventricular ejection fraction, by estimation, is 70 to 75%. Left ventricular ejection fraction by PLAX is 75 %. The left ventricle has hyperdynamic function. The left ventricle has no regional wall motion abnormalities. Definity  contrast agent was given IV to delineate the left ventricular endocardial borders. The left ventricular internal cavity size was normal in size. There is mild left ventricular hypertrophy. Left ventricular diastolic function could not be evaluated due to  mitral annular calcification (moderate or greater). Left ventricular diastolic function could not be evaluated. Right Ventricle: The right ventricular size is normal. No increase in right ventricular wall thickness. Right ventricular systolic function is normal. Left Atrium: Left atrial size was normal in size. Right Atrium: Right atrial size was normal in size. Pericardium: There is no evidence of pericardial effusion. Mitral Valve: The mitral valve is degenerative in appearance. Severe mitral annular calcification. Trivial mitral valve regurgitation. No evidence of mitral valve stenosis. MV peak gradient, 5.7 mmHg. The mean mitral valve gradient is 2.0 mmHg. Tricuspid Valve: The  tricuspid valve is grossly normal. Tricuspid valve regurgitation is mild. Aortic Valve: The aortic valve is tricuspid. Aortic valve regurgitation is trivial. Aortic valve sclerosis/calcification is present, without any evidence of aortic stenosis. Pulmonic Valve: The pulmonic valve was normal in structure. Pulmonic valve regurgitation is not visualized. Aorta: The aortic root and ascending aorta are structurally normal, with no evidence of dilitation. IAS/Shunts: No atrial level shunt detected by color flow Doppler.  LEFT VENTRICLE PLAX 2D LV EF:         Left            Diastology                ventricular     LV e' medial:  5.00 cm/s                ejection        LV e' lateral: 5.55 cm/s                fraction by                PLAX is 75                %. LVIDd:         4.40 cm LVIDs:  2.50 cm LV PW:         1.10 cm LV IVS:        1.10 cm LVOT diam:     1.80 cm LV SV:         26 LV SV Index:   14 LVOT Area:     2.54 cm  RIGHT VENTRICLE RV Basal diam:  2.40 cm RV S prime:     13.80 cm/s TAPSE (M-mode): 1.6 cm LEFT ATRIUM             Index        RIGHT ATRIUM           Index LA diam:        4.30 cm 2.26 cm/m   RA Area:     10.50 cm LA Vol (A2C):   48.4 ml 25.45 ml/m  RA Volume:   20.80 ml  10.94 ml/m LA Vol (A4C):   51.5 ml 27.08 ml/m LA Biplane Vol: 53.3 ml 28.03 ml/m  AORTIC VALVE LVOT Vmax:   73.05 cm/s LVOT Vmean:  45.700 cm/s LVOT VTI:    0.104 m  AORTA Ao Root diam: 3.40 cm MITRAL VALVE             TRICUSPID VALVE MV Area VTI:  1.03 cm   TR Peak grad:   25.2 mmHg MV Peak grad: 5.7 mmHg   TR Vmax:        251.00 cm/s MV Mean grad: 2.0 mmHg MV Vmax:      1.19 m/s   SHUNTS MV Vmean:     59.0 cm/s  Systemic VTI:  0.10 m                          Systemic Diam: 1.80 cm Vinie Maxcy MD Electronically signed by Vinie Maxcy MD Signature Date/Time: 03/28/2024/6:35:40 PM    Final    US  RENAL Result Date: 03/28/2024 CLINICAL DATA:  [id number] AKI (acute kidney injury) (HCC) [id number] EXAM: RENAL / URINARY  TRACT ULTRASOUND COMPLETE COMPARISON:  03/27/2024, 06/11/2022 FINDINGS: Right Kidney: Renal measurements: 10.2 x 5.7 x 5.9 cm = volume: 178 mL.Normal echogenicity. Unchanged upper pole cyst measuring 8.7 x 4.4 x 5.4 cm. Similar appearance of a large, staghorn calculus in the right renal collecting system. Trace pelviectasis without significant hydronephrosis. Left Kidney: Renal measurements: 11.7 x 6.8 x 6.2 cm = volume: 256 mL. Normal echogenicity. Upper pole cyst again noted measuring 4.3 x 5.2 x 6.6 cm. No hydronephrosis or nephrolithiasis. Bladder: Completely decompressed with a urinary catheter retention balloon in place. Other: None. IMPRESSION: Redemonstration of a large staghorn calculus in the right renal collecting system. No significant hydronephrosis in either kidney. Electronically Signed   By: Rogelia Myers M.D.   On: 03/28/2024 11:10   DG Abd 1 View Result Date: 03/28/2024 CLINICAL DATA:  141880 SOB (shortness of breath) [id number] Abdominal pain [id number]. EXAM: ABDOMEN - 1 VIEW COMPARISON:  03/27/2024. FINDINGS: Redemonstration of dilated and redundant sigmoid colon loop which exhibit moderate-to-large amount of fecal material. There is also gaseous distention of the stomach. Findings are grossly similar to the prior exam. No evidence of pneumoperitoneum. No acute osseous abnormalities. The soft tissues are within normal limits. Patient's known right staghorn calculus is not well seen. Surgical changes, devices, tubes and lines: Partially seen left proximal femur metallic hardware. IMPRESSION: Redemonstration of dilated and redundant sigmoid colon loop which exhibit moderate-to-large amount of fecal material. There is also gaseous distention  of the stomach. Findings are grossly similar to the prior exam. Electronically Signed   By: Ree Molt M.D.   On: 03/28/2024 11:05   DG CHEST PORT 1 VIEW Result Date: 03/28/2024 CLINICAL DATA:  141880 SOB (shortness of breath) 141880 [id number] Abdominal  pain [id number] EXAM: PORTABLE CHEST 1 VIEW COMPARISON:  03/27/2024. FINDINGS: Low lung volume. Elevated right hemidiaphragm noted. Bilateral lung fields are clear. Bilateral costophrenic angles are clear. Stable cardio-mediastinal silhouette. No acute osseous abnormalities. The soft tissues are within normal limits. IMPRESSION: No active disease. Electronically Signed   By: Ree Molt M.D.   On: 03/28/2024 11:03   DG Abdomen 1 View Result Date: 03/28/2024 CLINICAL DATA:  Coffee-ground emesis per epic notes EXAM: ABDOMEN - 1 VIEW COMPARISON:  03/24/2022, CT 03/27/2024 FINDINGS: Persistent air distension of the colon. Large retained feces at the rectum with increased stool burden. Faintly visible staghorn calculus in the region of right kidney. IMPRESSION: Persistent air distension of the colon with large retained feces at the rectum. Faintly visible right staghorn calculus Electronically Signed   By: Luke Bun M.D.   On: 03/28/2024 00:15   CT ABDOMEN PELVIS W CONTRAST Result Date: 03/27/2024 EXAM: CT ABDOMEN AND PELVIS WITH CONTRAST 03/27/2024 10:37:00 PM TECHNIQUE: CT of the abdomen and pelvis was performed with the administration of intravenous contrast. Multiplanar reformatted images are provided for review. Automated exposure control, iterative reconstruction, and/or weight-based adjustment of the mA/kV was utilized to reduce the radiation dose to as low as reasonably achievable. COMPARISON: CT 06/11/2022 CLINICAL HISTORY: Abdominal pain, acute, nonlocalized. FINDINGS: LOWER CHEST: No acute abnormality. LIVER: Stable hepatic cysts. Cirrhosis. Cavernous transformation of the portal vein. GALLBLADDER AND BILE DUCTS: Gallbladder is unremarkable. No biliary ductal dilatation. SPLEEN: No acute abnormality. PANCREAS: No acute abnormality. ADRENAL GLANDS: No acute abnormality. KIDNEYS, URETERS AND BLADDER: Staghorn calculus in the right kidney extending into the proximal right ureter. Mild right hydronephrosis  and dilation of the proximal right ureter. This is new compared to the most recent comparison of 06/11/2022. Linear calcification along the posterior left bladder and layering posteriorly in the large left bladder diverticulum. GI AND BOWEL: Marked wall thickening of the distal esophagus. Large stool ball in the rectum with mild rectal wall thickening and trace adjacent stranding. The finding is concerning for impaction and stercoral colitis. Diverticulosis without evidence of diverticulitis. The sigmoid colon herniates into the left inguinal hernia without evidence of obstruction. PERITONEUM AND RETROPERITONEUM: No ascites. No free air. VASCULATURE: Aorta is normal in caliber. LYMPH NODES: No lymphadenopathy. REPRODUCTIVE ORGANS: No acute abnormality. BONES AND SOFT TISSUES: Postoperative changes of the left femur. Advanced arthritis left hip. Demineralization. Small fat and fluid-containing right inguinal hernia. IMPRESSION: 1. Large stool ball in the rectum with mild rectal wall thickening and trace adjacent stranding, concerning for impaction and stercoral colitis. The colon is dilated upstream from the rectal stool ball. 2. Staghorn calculus in the right kidney extending into the proximal right ureter with mild right hydronephrosis and dilation of the proximal right ureter, new compared to 06/11/2022. 3. Marked wall thickening of the distal esophagus, esophagitis. Infiltrative mass not excluded. 4. Sigmoid colon herniates into the left inguinal hernia without evidence of obstruction. 5. 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 Electronically signed by: Norman Gatlin MD 03/27/2024 10:52 PM EDT RP Workstation: [id number]   CT Head Wo Contrast Result Date: 03/27/2024 CLINICAL DATA:  Mental status change, unknown cause EXAM: CT HEAD WITHOUT CONTRAST TECHNIQUE: Contiguous axial images were obtained from the  base of the skull through the vertex without intravenous contrast. RADIATION DOSE  REDUCTION: This exam was performed according to the departmental dose-optimization program which includes automated exposure control, adjustment of the mA and/or kV according to patient size and/or use of iterative reconstruction technique. COMPARISON:  08/31/2016 FINDINGS: Brain: No acute intracranial abnormality. Specifically, no hemorrhage, hydrocephalus, mass lesion, acute infarction, or significant intracranial injury. Mild age related atrophy. Vascular: No hyperdense vessel or unexpected calcification. Skull: No acute calvarial abnormality. Sinuses/Orbits: No acute findings Other: None IMPRESSION: No acute intracranial abnormality. Electronically Signed   By: Franky Crease M.D.   On: 03/27/2024 22:46   DG Chest Portable 1 View Result Date: 03/27/2024 CLINICAL DATA:  Shortness of breath EXAM: PORTABLE CHEST 1 VIEW COMPARISON:  03/28/2022 FINDINGS: Hypoventilatory changes. Subsegmental atelectasis at the right base. Mitral annular calcification. Stable cardiomediastinal silhouette. No pleural effusion or pneumothorax IMPRESSION: Hypoventilatory changes with subsegmental atelectasis at the right base. Electronically Signed   By: Luke Bun M.D.   On: 03/27/2024 20:40    Microbiology: No results found for this or any previous visit (from the past 240 hours).   Labs: Basic Metabolic Panel: Recent Labs  Lab 04/13/24 0254 04/14/24 0259 04/15/24 0225 04/16/24 0109 May 01, 2024 0223  NA 141 144 147* 145 149*  K 3.9 3.8 3.1* 3.7 3.9  CL 109 108 111 112* 109  CO2 23 24 28 27 30   GLUCOSE 232* 175* 237* 260* 206*  BUN 26* 28* 27* 31* 37*  CREATININE 0.50 0.51 0.48 0.53 0.62  CALCIUM 8.7* 8.6* 8.4* 8.1* 8.6*  MG 2.1 2.1 2.1 1.9 2.3   Liver Function Tests: Recent Labs  Lab 04/12/24 1150  AST 12*  ALT 15  ALKPHOS 58  BILITOT 0.4  PROT 5.9*  ALBUMIN  1.8*   No results for input(s): LIPASE, AMYLASE in the last 168 hours. Recent Labs  Lab 04/12/24 1150 04/13/24 0254 04/14/24 0259   AMMONIA 117* 97* 41*   CBC: Recent Labs  Lab 04/13/24 0646 04/14/24 0259 04/15/24 0225 04/16/24 0109 05/01/2024 0223  WBC 11.5* 6.2 6.3 7.5 15.3*  NEUTROABS 7.5 4.0 5.0 5.4 12.6*  HGB 10.1* 9.6* 8.6* 8.8* 9.0*  HCT 34.8* 33.5* 30.4* 30.7* 32.6*  MCV 94.8 94.1 93.8 94.2 96.7  PLT 383 305 240 225 266   Cardiac Enzymes: No results for input(s): CKTOTAL, CKMB, CKMBINDEX, TROPONINI in the last 168 hours. D-Dimer No results for input(s): DDIMER in the last 72 hours. BNP: Invalid input(s): POCBNP CBG: Recent Labs  Lab 04/16/24 1058 04/16/24 1601 04/16/24 2019 04/16/24 2321 05-01-2024 0354  GLUCAP 216* 269* 253* 205* 174*   Anemia work up No results for input(s): VITAMINB12, FOLATE, FERRITIN, TIBC, IRON, RETICCTPCT in the last 72 hours. Urinalysis    Component Value Date/Time   COLORURINE AMBER (A) 03/28/2024 1238   APPEARANCEUR CLOUDY (A) 03/28/2024 1238   LABSPEC 1.033 (H) 03/28/2024 1238   PHURINE 7.0 03/28/2024 1238   GLUCOSEU NEGATIVE 03/28/2024 1238   HGBUR MODERATE (A) 03/28/2024 1238   BILIRUBINUR NEGATIVE 03/28/2024 1238   KETONESUR NEGATIVE 03/28/2024 1238   PROTEINUR 30 (A) 03/28/2024 1238   NITRITE NEGATIVE 03/28/2024 1238   LEUKOCYTESUR LARGE (A) 03/28/2024 1238   Sepsis Labs Recent Labs  Lab 04/14/24 0259 04/15/24 0225 04/16/24 0109 05/01/2024 0223  WBC 6.2 6.3 7.5 15.3*       SIGNED:  Alm Apo, MD  [facility] 05-01-24, 12:05 PM

## 2024-04-18 NOTE — Progress Notes (Signed)
 9178 - Patient was non responsive and did not have pulse. Pronounced patient dead with second RN, Luke Ide. Notified Dr. Patsy of patient's expiration. Dr. Patsy advised that he would notify family of patient's passing.   [id number] [place] [facility] [phone] of patient's death, spoke with Laurn Didomizio, patient Referral [phone] Tissue Only  1050 - Family here to see patient, provided name of funeral home and pick up patient items. Thanked staff for their help.

## 2024-04-18 DEATH — deceased
